# Patient Record
Sex: Male | Born: 1940
Health system: Southern US, Community
[De-identification: ages and names within clinical notes are randomized; demographics above are authoritative.]

## PROBLEM LIST (undated history)

## (undated) DIAGNOSIS — E538 Deficiency of other specified B group vitamins: Secondary | ICD-10-CM

## (undated) DIAGNOSIS — I639 Cerebral infarction, unspecified: Secondary | ICD-10-CM

## (undated) DIAGNOSIS — U071 COVID-19: Secondary | ICD-10-CM

## (undated) DIAGNOSIS — I10 Essential (primary) hypertension: Secondary | ICD-10-CM

## (undated) DIAGNOSIS — H269 Unspecified cataract: Secondary | ICD-10-CM

## (undated) DIAGNOSIS — E559 Vitamin D deficiency, unspecified: Secondary | ICD-10-CM

## (undated) DIAGNOSIS — K317 Polyp of stomach and duodenum: Secondary | ICD-10-CM

## (undated) DIAGNOSIS — B029 Zoster without complications: Secondary | ICD-10-CM

## (undated) DIAGNOSIS — K861 Other chronic pancreatitis: Secondary | ICD-10-CM

## (undated) DIAGNOSIS — K219 Gastro-esophageal reflux disease without esophagitis: Secondary | ICD-10-CM

## (undated) DIAGNOSIS — K449 Diaphragmatic hernia without obstruction or gangrene: Secondary | ICD-10-CM

## (undated) DIAGNOSIS — M1990 Unspecified osteoarthritis, unspecified site: Secondary | ICD-10-CM

## (undated) DIAGNOSIS — J449 Chronic obstructive pulmonary disease, unspecified: Secondary | ICD-10-CM

## (undated) DIAGNOSIS — E7800 Pure hypercholesterolemia, unspecified: Secondary | ICD-10-CM

## (undated) DIAGNOSIS — G5600 Carpal tunnel syndrome, unspecified upper limb: Secondary | ICD-10-CM

## (undated) DIAGNOSIS — L570 Actinic keratosis: Secondary | ICD-10-CM

## (undated) DIAGNOSIS — J189 Pneumonia, unspecified organism: Secondary | ICD-10-CM

## (undated) HISTORY — DX: Deficiency of other specified B group vitamins: E53.8

## (undated) HISTORY — PX: BACK SURGERY: SHX140

## (undated) HISTORY — DX: Other chronic pancreatitis: K86.1

## (undated) HISTORY — DX: COVID-19: U07.1

## (undated) HISTORY — DX: Polyp of stomach and duodenum: K31.7

## (undated) HISTORY — DX: Zoster without complications: B02.9

## (undated) HISTORY — DX: Gastro-esophageal reflux disease without esophagitis: K21.9

## (undated) HISTORY — DX: Diaphragmatic hernia without obstruction or gangrene: K44.9

## (undated) HISTORY — DX: Vitamin D deficiency, unspecified: E55.9

## (undated) HISTORY — DX: Unspecified cataract: H26.9

## (undated) HISTORY — DX: Pure hypercholesterolemia, unspecified: E78.00

## (undated) HISTORY — DX: Essential (primary) hypertension: I10

## (undated) HISTORY — PX: CHOLECYSTECTOMY: SHX55

## (undated) HISTORY — PX: MELANOMA EXCISION: SHX5266

## (undated) HISTORY — PX: CATARACT EXTRACTION W/ INTRAOCULAR LENS  IMPLANT, BILATERAL: SHX1307

## (undated) HISTORY — DX: Actinic keratosis: L57.0

## (undated) HISTORY — PX: APPENDECTOMY: SHX54

## (undated) HISTORY — DX: Unspecified osteoarthritis, unspecified site: M19.90

---

## 1998-02-09 ENCOUNTER — Ambulatory Visit (HOSPITAL_COMMUNITY): Admission: RE | Admit: 1998-02-09 | Discharge: 1998-02-09 | Payer: Self-pay | Admitting: Cardiology

## 2003-10-21 DIAGNOSIS — C4491 Basal cell carcinoma of skin, unspecified: Secondary | ICD-10-CM

## 2003-10-21 DIAGNOSIS — C439 Malignant melanoma of skin, unspecified: Secondary | ICD-10-CM

## 2003-10-21 HISTORY — DX: Basal cell carcinoma of skin, unspecified: C44.91

## 2003-10-21 HISTORY — DX: Malignant melanoma of skin, unspecified: C43.9

## 2003-12-29 ENCOUNTER — Encounter: Payer: Self-pay | Admitting: Gastroenterology

## 2004-01-04 ENCOUNTER — Ambulatory Visit (HOSPITAL_COMMUNITY): Admission: RE | Admit: 2004-01-04 | Discharge: 2004-01-04 | Payer: Self-pay | Admitting: Gastroenterology

## 2004-05-19 ENCOUNTER — Ambulatory Visit: Payer: Self-pay

## 2004-06-09 ENCOUNTER — Ambulatory Visit: Payer: Self-pay | Admitting: Gastroenterology

## 2004-06-12 ENCOUNTER — Ambulatory Visit: Payer: Self-pay | Admitting: Gastroenterology

## 2004-06-13 ENCOUNTER — Encounter (INDEPENDENT_AMBULATORY_CARE_PROVIDER_SITE_OTHER): Payer: Self-pay | Admitting: Specialist

## 2004-06-13 ENCOUNTER — Observation Stay (HOSPITAL_COMMUNITY): Admission: EM | Admit: 2004-06-13 | Discharge: 2004-06-13 | Payer: Self-pay | Admitting: *Deleted

## 2005-02-20 ENCOUNTER — Ambulatory Visit: Payer: Self-pay | Admitting: Gastroenterology

## 2005-03-21 ENCOUNTER — Ambulatory Visit: Payer: Self-pay | Admitting: Dermatology

## 2006-12-12 ENCOUNTER — Ambulatory Visit: Payer: Self-pay | Admitting: Gastroenterology

## 2007-05-18 ENCOUNTER — Emergency Department (HOSPITAL_COMMUNITY): Admission: EM | Admit: 2007-05-18 | Discharge: 2007-05-18 | Payer: Self-pay | Admitting: Emergency Medicine

## 2007-07-02 DIAGNOSIS — E7800 Pure hypercholesterolemia, unspecified: Secondary | ICD-10-CM | POA: Insufficient documentation

## 2007-07-02 DIAGNOSIS — K219 Gastro-esophageal reflux disease without esophagitis: Secondary | ICD-10-CM | POA: Insufficient documentation

## 2007-07-02 DIAGNOSIS — E782 Mixed hyperlipidemia: Secondary | ICD-10-CM | POA: Insufficient documentation

## 2008-02-10 DIAGNOSIS — C4491 Basal cell carcinoma of skin, unspecified: Secondary | ICD-10-CM

## 2008-02-10 HISTORY — DX: Basal cell carcinoma of skin, unspecified: C44.91

## 2008-10-18 ENCOUNTER — Encounter: Payer: Self-pay | Admitting: Gastroenterology

## 2008-11-09 ENCOUNTER — Encounter: Payer: Self-pay | Admitting: Gastroenterology

## 2008-12-21 ENCOUNTER — Telehealth: Payer: Self-pay | Admitting: Gastroenterology

## 2009-02-21 ENCOUNTER — Telehealth (INDEPENDENT_AMBULATORY_CARE_PROVIDER_SITE_OTHER): Payer: Self-pay | Admitting: *Deleted

## 2009-03-22 ENCOUNTER — Ambulatory Visit: Payer: Self-pay | Admitting: Gastroenterology

## 2009-03-22 DIAGNOSIS — I10 Essential (primary) hypertension: Secondary | ICD-10-CM | POA: Insufficient documentation

## 2009-04-09 HISTORY — PX: COLOSTOMY: SHX63

## 2009-04-09 HISTORY — PX: COLONOSCOPY: SHX174

## 2009-05-11 ENCOUNTER — Ambulatory Visit: Payer: Self-pay | Admitting: Gastroenterology

## 2009-05-11 LAB — HM COLONOSCOPY: HM COLON: NORMAL

## 2009-05-13 ENCOUNTER — Ambulatory Visit: Payer: Self-pay | Admitting: Gastroenterology

## 2009-05-13 DIAGNOSIS — E538 Deficiency of other specified B group vitamins: Secondary | ICD-10-CM | POA: Insufficient documentation

## 2009-05-13 LAB — CONVERTED CEMR LAB
ALT: 15 units/L (ref 0–53)
AST: 24 units/L (ref 0–37)
Albumin: 4.3 g/dL (ref 3.5–5.2)
Alkaline Phosphatase: 73 units/L (ref 39–117)
BUN: 16 mg/dL (ref 6–23)
Basophils Absolute: 0.1 10*3/uL (ref 0.0–0.1)
Basophils Relative: 0.7 % (ref 0.0–3.0)
Bilirubin, Direct: 0.1 mg/dL (ref 0.0–0.3)
CO2: 29 meq/L (ref 19–32)
Calcium: 9.5 mg/dL (ref 8.4–10.5)
Chloride: 106 meq/L (ref 96–112)
Creatinine, Ser: 1.2 mg/dL (ref 0.4–1.5)
Eosinophils Absolute: 0.2 10*3/uL (ref 0.0–0.7)
Eosinophils Relative: 2.6 % (ref 0.0–5.0)
Ferritin: 157.9 ng/mL (ref 22.0–322.0)
Folate: 7.7 ng/mL
GFR calc non Af Amer: 64.03 mL/min (ref >60)
Glucose, Bld: 83 mg/dL (ref 70–99)
HCT: 44.2 % (ref 39.0–52.0)
Hemoglobin: 15.3 g/dL (ref 13.0–17.0)
Iron: 145 ug/dL (ref 42–165)
Lymphocytes Relative: 27.4 % (ref 12.0–46.0)
Lymphs Abs: 2 10*3/uL (ref 0.7–4.0)
MCHC: 34.5 g/dL (ref 30.0–36.0)
MCV: 93.9 fL (ref 78.0–100.0)
Monocytes Absolute: 1.1 10*3/uL — ABNORMAL HIGH (ref 0.1–1.0)
Monocytes Relative: 15.8 % — ABNORMAL HIGH (ref 3.0–12.0)
Neutro Abs: 3.8 10*3/uL (ref 1.4–7.7)
Neutrophils Relative %: 53.5 % (ref 43.0–77.0)
Platelets: 179 10*3/uL (ref 150.0–400.0)
Potassium: 4.5 meq/L (ref 3.5–5.1)
RBC: 4.71 M/uL (ref 4.22–5.81)
RDW: 13.2 % (ref 11.5–14.6)
Saturation Ratios: 25.3 % (ref 20.0–50.0)
Sodium: 142 meq/L (ref 135–145)
TSH: 1.05 microintl units/mL (ref 0.35–5.50)
Total Bilirubin: 0.8 mg/dL (ref 0.3–1.2)
Total Protein: 7.4 g/dL (ref 6.0–8.3)
Transferrin: 409.6 mg/dL — ABNORMAL HIGH (ref 212.0–360.0)
Vitamin B-12: 236 pg/mL (ref 211–911)
WBC: 7.2 10*3/uL (ref 4.5–10.5)

## 2009-05-20 ENCOUNTER — Ambulatory Visit: Payer: Self-pay | Admitting: Gastroenterology

## 2009-05-27 ENCOUNTER — Ambulatory Visit: Payer: Self-pay | Admitting: Gastroenterology

## 2009-05-27 ENCOUNTER — Telehealth (INDEPENDENT_AMBULATORY_CARE_PROVIDER_SITE_OTHER): Payer: Self-pay | Admitting: *Deleted

## 2009-07-25 ENCOUNTER — Telehealth: Payer: Self-pay | Admitting: Gastroenterology

## 2009-11-28 ENCOUNTER — Telehealth: Payer: Self-pay | Admitting: Gastroenterology

## 2009-12-06 ENCOUNTER — Ambulatory Visit: Payer: Self-pay | Admitting: Gastroenterology

## 2009-12-06 DIAGNOSIS — R197 Diarrhea, unspecified: Secondary | ICD-10-CM | POA: Insufficient documentation

## 2009-12-06 LAB — CONVERTED CEMR LAB
ALT: 16 units/L (ref 0–53)
AST: 23 units/L (ref 0–37)
Albumin: 3.9 g/dL (ref 3.5–5.2)
Alkaline Phosphatase: 70 units/L (ref 39–117)
BUN: 15 mg/dL (ref 6–23)
Basophils Absolute: 0 10*3/uL (ref 0.0–0.1)
Basophils Relative: 0.5 % (ref 0.0–3.0)
Bilirubin, Direct: 0.2 mg/dL (ref 0.0–0.3)
CO2: 25 meq/L (ref 19–32)
Calcium: 9.1 mg/dL (ref 8.4–10.5)
Chloride: 108 meq/L (ref 96–112)
Creatinine, Ser: 1 mg/dL (ref 0.4–1.5)
Eosinophils Absolute: 0.1 10*3/uL (ref 0.0–0.7)
Eosinophils Relative: 1.4 % (ref 0.0–5.0)
Ferritin: 178.9 ng/mL (ref 22.0–322.0)
Folate: 5.9 ng/mL
GFR calc non Af Amer: 76.21 mL/min (ref >60)
Glucose, Bld: 86 mg/dL (ref 70–99)
HCT: 40.4 % (ref 39.0–52.0)
Hemoglobin: 13.9 g/dL (ref 13.0–17.0)
IgA: 94 mg/dL (ref 68–378)
Iron: 168 ug/dL — ABNORMAL HIGH (ref 42–165)
Lymphocytes Relative: 26.1 % (ref 12.0–46.0)
Lymphs Abs: 2 10*3/uL (ref 0.7–4.0)
MCHC: 34.4 g/dL (ref 30.0–36.0)
MCV: 92.5 fL (ref 78.0–100.0)
Magnesium: 2 mg/dL (ref 1.5–2.5)
Monocytes Absolute: 0.9 10*3/uL (ref 0.1–1.0)
Monocytes Relative: 11.6 % (ref 3.0–12.0)
Neutro Abs: 4.5 10*3/uL (ref 1.4–7.7)
Neutrophils Relative %: 60.4 % (ref 43.0–77.0)
Platelets: 180 10*3/uL (ref 150.0–400.0)
Potassium: 4.1 meq/L (ref 3.5–5.1)
RBC: 4.36 M/uL (ref 4.22–5.81)
RDW: 15.1 % — ABNORMAL HIGH (ref 11.5–14.6)
Saturation Ratios: 31.6 % (ref 20.0–50.0)
Sodium: 141 meq/L (ref 135–145)
TSH: 0.86 microintl units/mL (ref 0.35–5.50)
Total Bilirubin: 0.5 mg/dL (ref 0.3–1.2)
Total Protein: 6.3 g/dL (ref 6.0–8.3)
Transferrin: 379.2 mg/dL — ABNORMAL HIGH (ref 212.0–360.0)
Vitamin B-12: 380 pg/mL (ref 211–911)
WBC: 7.5 10*3/uL (ref 4.5–10.5)

## 2009-12-07 ENCOUNTER — Encounter: Payer: Self-pay | Admitting: Gastroenterology

## 2010-01-03 ENCOUNTER — Ambulatory Visit: Payer: Self-pay | Admitting: Gastroenterology

## 2010-01-03 DIAGNOSIS — K861 Other chronic pancreatitis: Secondary | ICD-10-CM | POA: Insufficient documentation

## 2010-01-30 ENCOUNTER — Telehealth: Payer: Self-pay | Admitting: Gastroenterology

## 2010-03-21 ENCOUNTER — Encounter: Payer: Self-pay | Admitting: Gastroenterology

## 2010-04-25 ENCOUNTER — Ambulatory Visit
Admission: RE | Admit: 2010-04-25 | Discharge: 2010-04-25 | Payer: Self-pay | Source: Home / Self Care | Attending: Gastroenterology | Admitting: Gastroenterology

## 2010-05-01 ENCOUNTER — Ambulatory Visit (HOSPITAL_COMMUNITY)
Admission: RE | Admit: 2010-05-01 | Discharge: 2010-05-01 | Payer: Self-pay | Source: Home / Self Care | Attending: Gastroenterology | Admitting: Gastroenterology

## 2010-05-08 ENCOUNTER — Telehealth: Payer: Self-pay | Admitting: Gastroenterology

## 2010-05-09 NOTE — Assessment & Plan Note (Signed)
Summary: #2 OF 3 WEEKLY B12.Marland KitchenMarland KitchenAM.  Nurse Visit   Allergies: 1)  Septra (Sulfamethoxazole-Trimethoprim)  Medication Administration  Injection # 1:    Medication: Vit B12 1000 mcg    Diagnosis: B12 DEFICIENCY (ICD-266.2)    Route: IM    Site: L deltoid    Exp Date: 02/08/2011    Lot #: [id number]    Mfr: American Regent    Patient tolerated injection without complications    Given by: Christie Nottingham CMA Duncan Dull) (May 20, 2009 8:51 AM)  Orders Added: 1)  Vit B12 1000 mcg [J3420]

## 2010-05-09 NOTE — Progress Notes (Signed)
Summary: med ?  Phone Note Call from Patient Call back at Home Phone [phone]   Caller: Patient Call For: Dr. Jarold Motto Reason for Call: Talk to Nurse Summary of Call: would like to lower [name] dosages Initial call taken by: Vallarie Mare,  January 30, 2010 8:25 AM  Follow-up for Phone Call        patient is in the dought hole and wants to decrease to once by mouth at meals and he wanted samples. I gave 8 bottle and advised ok to decease and see how it works and if increased pain he will go back to two at meals.  Follow-up by: Harlow Mares CMA (AAMA),  January 30, 2010 9:30 AM    New/Updated Medications: [name] 15000 UNIT CPEP (PANCRELIPASE (LIP-PROT-AMYL)) take 1 tabs by mouth three times a day with meals and one with snack.

## 2010-05-09 NOTE — Assessment & Plan Note (Signed)
Summary: #3 of 3 weekly b-12 inj/all  Nurse Visit   Allergies: 1)  Septra (Sulfamethoxazole-Trimethoprim)  Medication Administration  Injection # 1:    Medication: Vit B12 1000 mcg    Diagnosis: B12 DEFICIENCY (ICD-266.2)    Route: IM    Site: L thigh    Exp Date: 02/2011    Lot #: [id number]    Mfr: American Regent    Comments: Trained Pt to administer B12 injections to himself. Wants rx and syringes called into [facility]. Informed Lupita Leash to order pt supplies.    Patient tolerated injection without complications    Given by: Merri Ray CMA Duncan Dull) (May 27, 2009 8:36 AM)  Orders Added: 1)  Vit B12 1000 mcg [J3420]

## 2010-05-09 NOTE — Assessment & Plan Note (Signed)
Summary: 4 WK F/U   History of Present Illness Visit Type: Follow-up Visit Primary GI MD: Sheryn Bison MD FACP FAGA Primary Claudine Stallings: Raliegh Ip  Requesting Soleia Badolato: n/a Chief Complaint: diarrhea is sporadic, he willhave 3-4 BM's/day with an episode. Pt denies any rectal bleeding. History of Present Illness:   his diarrhea has improved on pancreatic extract therapy. He had a marked depressed fecal Elastase-1 level consistent with exocrine pancreatic insufficiency. He also uses p.r.n. Imodium and has been able to stop his Colestid and Questran. He has no other symptoms of malabsorption, or systemic illness. His appetite is good his weight is stable. He is no longer on PPI therapy. He denies a history of alcohol abuse or family history of pancreatic disease. He is up-to-date on his endoscopy and colonoscopy. Recent B12, folate, and other labs were normal.   GI Review of Systems      Denies abdominal pain, acid reflux, belching, bloating, chest pain, dysphagia with liquids, dysphagia with solids, heartburn, loss of appetite, nausea, vomiting, vomiting blood, weight loss, and  weight gain.      Reports diarrhea.     Denies anal fissure, black tarry stools, change in bowel habit, constipation, diverticulosis, fecal incontinence, heme positive stool, hemorrhoids, irritable bowel syndrome, jaundice, light color stool, liver problems, rectal bleeding, and  rectal pain.    Current Medications (verified): 1)  Colestid 1 Gm Tabs (Colestipol Hcl) .Marland Kitchen.. 1 By Mouth Two Times A Day 2)  Lisinopril 20 Mg Tabs (Lisinopril) .... One Tablet By Mouth Once Daily 3)  Lovastatin 40 Mg Tabs (Lovastatin) .... One Tablet By Mouth Once Daily 4)  Imodium A-D 2 Mg Tabs (Loperamide Hcl) .... Take One By Mouth Once Daily As Needed 5)  Syringe Disposable 3 Ml Misc (Syringe (Disposable)) .... Inject 1 Cc B12 Once Monthly 6)  Gemfibrozil 600 Mg Tabs (Gemfibrozil) .... Take One Tablet By Mouth Two Times A Day 7)   [name] 15000 Unit Cpep (Pancrelipase (Lip-Prot-Amyl)) .... Take 2 Tabs By Mouth Three Times A Day With Meals  Allergies (verified): 1)  Septra  Past History:  Past medical, surgical, family and social histories (including risk factors) reviewed for relevance to current acute and chronic problems.  Past Medical History: Reviewed history from 12/06/2009 and no changes required. Hx. of melanoma removed cardiolite stress test(02/03/1998) Hypertensive Cardiovascular Disease left heart catheterization(02/09/1998)  Past Surgical History: Reviewed history from 07/02/2007 and no changes required. Appendectomy Cholecystectomy  Family History: Reviewed history from 03/22/2009 and no changes required. No FH of Colon Cancer: Family History of Crohn's:Sister  Family History of Colitis: Brother   Social History: Reviewed history from 12/06/2009 and no changes required. Occupation: Truck Driver-Local Patient currently smokes. 1 ppd Alcohol Use - no Daily Caffeine Use: coffee daily  Illicit Drug Use - no  Review of Systems  The patient denies allergy/sinus, anemia, anxiety-new, arthritis/joint pain, back pain, blood in urine, breast changes/lumps, confusion, cough, coughing up blood, depression-new, fainting, fatigue, fever, headaches-new, hearing problems, heart murmur, heart rhythm changes, itching, muscle pains/cramps, night sweats, nosebleeds, shortness of breath, skin rash, sleeping problems, sore throat, swelling of feet/legs, swollen lymph glands, thirst - excessive, urination - excessive, urination changes/pain, urine leakage, vision changes, and voice change.    Vital Signs:  Patient profile:   70 year old male Height:      67 inches Weight:      158 pounds BMI:     24.84 Pulse rate:   76 / minute Pulse rhythm:   regular  BP sitting:   120 / 66  (left arm) Cuff size:   regular  Vitals Entered By: Francee Piccolo CMA Duncan Dull) (January 03, 2010 10:57 AM)  Physical  Exam  General:  Well developed, well nourished, no acute distress.healthy appearing.   Head:  Normocephalic and atraumatic. Eyes:  PERRLA, no icterus.exam deferred to patient's ophthalmologist.   Psych:  Alert and cooperative. Normal mood and affect.   Impression & Recommendations:  Problem # 1:  CHRONIC PANCREATITIS (ICD-577.1) Assessment Improved Continued use of [name] pancreatic extract 2 pills with meals and one with snacks. P.r.n. Imodium also advised. GI followup in 2-3 months time.  Problem # 2:  DIARRHEA (ICD-787.91) Assessment: Improved  Problem # 3:  B12 DEFICIENCY (ICD-266.2) Assessment: Improved  Problem # 4:  ACID REFLUX DISEASE (ICD-530.81) Assessment: Improved  Patient Instructions: 1)  Copy sent to : Raliegh Ip  2)  Your prescription has been sent to you pharmacy.  3)  Please continue current medications.  4)  Please schedule a follow-up appointment in 3 months. 5)  The medication list was reviewed and reconciled.  All changed / newly prescribed medications were explained.  A complete medication list was provided to the patient / caregiver. Prescriptions: [name] 15000 UNIT CPEP (PANCRELIPASE (LIP-PROT-AMYL)) take 2 tabs by mouth three times a day with meals and one with snack.  #240 x 3   Entered by:   Harlow Mares CMA (AAMA)   Authorized by:   Mardella Layman MD [facility]   Signed by:   Harlow Mares CMA (AAMA) on 01/03/2010   Method used:   Electronically to        [facility]* (retail)       [address]       [place], [place]  [address]       Ph: [phone]       Fax: [phone]   RxID:   [id number]

## 2010-05-09 NOTE — Progress Notes (Signed)
Summary: triage  Phone Note Call from Patient Call back at Home Phone [phone] Call back at cell---- [phone]   Caller: Patient Call For: Dr Jarold Motto Reason for Call: Talk to Nurse Summary of Call: Patient wants to know if he can get something called in for severe diarrhea states that Cholestyramine is not working and Dr Jarold Motto had mentioned another med last time he saw him.  Initial call taken by: Tawni Levy,  November 28, 2009 9:33 AM  Follow-up for Phone Call        Pt has been taknig Questran powder for 5 years.  Has worked well until recently.  Pt has colon in Feb and Dr. Jarold Motto mtntioned to pt that there was a pill that he could take if the powder did not work.  Pt would like to try the Pill.  Pt states he tried taking the [place] two times a day and he still had diarrhea. Follow-up by: Ashok Cordia RN,  November 28, 2009 10:01 AM  Additional Follow-up for Phone Call Additional follow up Details #1::        needs ov Additional Follow-up by: Mardella Layman MD Clementeen Graham,  November 28, 2009 10:54 AM    Additional Follow-up for Phone Call Additional follow up Details #2::    Appt Scheduled.  Pt notified. Follow-up by: Ashok Cordia RN,  November 28, 2009 11:01 AM

## 2010-05-09 NOTE — Letter (Signed)
Summary: Patient Notice- Colon Biospy Results  [facility]  [address] [place], [place] [address]   Phone: [phone]  Fax: [phone]        May 13, 2009 MRN: [id number]    Cody Willis [address] [place], [place]  [address]    Dear Cody Willis,  I am pleased to inform you that the biopsies taken during your recent colonoscopy did not show any evidence of cancer upon pathologic examination.  Additional information/recommendations:  _x_No further action is needed at this time.  Please follow-up with      your primary care physician for your other healthcare needs.  __Please call [phone] to schedule a return visit to review      your condition.  __Continue with the treatment plan as outlined on the day of your      exam.  __You should have a repeat colonoscopy examination for this problem           in _ years.  Please call us if you are having persistent problems or have questions about your condition that have not been fully answered at this time.  Sincerely,  Mardella Layman MD [facility]   This letter has been electronically signed by your physician.  Appended Document: Patient Notice- Colon Biospy Results Letter mailed 2.8.11

## 2010-05-09 NOTE — Progress Notes (Signed)
Summary: Refill  Phone Note From Pharmacy   Caller: [facility]* Summary of Call: Refill requested for Questran. Initial call taken by: Ashok Cordia RN,  July 25, 2009 8:37 AM    Prescriptions: CHOLESTYRAMINE 4 GM/DOSE POWD (CHOLESTYRAMINE) Take 1 scoop dissolved in water once a day  #1 x 11   Entered by:   Ashok Cordia RN   Authorized by:   Mardella Layman MD [facility]   Signed by:   Ashok Cordia RN on 07/25/2009   Method used:   Electronically to        [facility]* (retail)       [address]       [place], [place]  [address]       Ph: [phone]       Fax: [phone]   RxID:   [id number]

## 2010-05-09 NOTE — Assessment & Plan Note (Signed)
Summary: #1 of 3 weekly B12 inj/dfs  Nurse Visit   Allergies: 1)  Septra (Sulfamethoxazole-Trimethoprim)  Medication Administration  Injection # 1:    Medication: Vit B12 1000 mcg    Diagnosis: B12 DEFICIENCY (ICD-266.2)    Route: IM    Site: R deltoid    Exp Date: 02/2011    Lot #: [id number]    Mfr: American Regent    Comments: pt scheduled at front desk for # 2 weekly of 3 and then monthly    Patient tolerated injection without complications    Given by: Chales Abrahams CMA Duncan Dull) (May 13, 2009 11:31 AM)  Orders Added: 1)  Vit B12 1000 mcg [J3420]

## 2010-05-09 NOTE — Progress Notes (Signed)
Summary: B12 injections  Phone Note Outgoing Call   Call placed by: Ashok Cordia RN,  May 27, 2009 8:33 AM Summary of Call: Pt is going to give his own B12 injections.  Needs Rx sent to pharmacy for B12 and syringes.  Uses [facility]. Initial call taken by: Ashok Cordia RN,  May 27, 2009 8:34 AM    New/Updated Medications: CYANOCOBALAMIN 1000 MCG/ML INJ SOLN (CYANOCOBALAMIN) 1 cc Im  monthly SYRINGE DISPOSABLE 3 ML MISC (SYRINGE (DISPOSABLE)) Inject 1 cc B12 once monthly Prescriptions: SYRINGE DISPOSABLE 3 ML MISC (SYRINGE (DISPOSABLE)) Inject 1 cc B12 once monthly  #12 x 6   Entered by:   Ashok Cordia RN   Authorized by:   Mardella Layman MD [facility]   Signed by:   Ashok Cordia RN on 05/27/2009   Method used:   Electronically to        [facility]* (retail)       [address]       [place], [place]  [address]       Ph: [phone]       Fax: [phone]   RxID:   [id number] CYANOCOBALAMIN 1000 MCG/ML INJ SOLN (CYANOCOBALAMIN) 1 cc Im  monthly  #12 x 6   Entered by:   Ashok Cordia RN   Authorized by:   Mardella Layman MD [facility]   Signed by:   Ashok Cordia RN on 05/27/2009   Method used:   Electronically to        [facility]* (retail)       [address]       [place], [place]  [address]       Ph: [phone]       Fax: [phone]   RxID:   [id number]

## 2010-05-09 NOTE — Assessment & Plan Note (Signed)
Summary: Diarrhea/dfs   History of Present Illness Visit Type: Follow-up Visit Primary GI MD: Sheryn Bison MD FACP FAGA Primary Mariska Daffin: Raliegh Ip  Requesting Jadon Harbaugh: n/a Chief Complaint: Patient has some questions about changing from Cholestyramine to another drug, He is having increased diarrhea and has even has some fecal incontinence.  History of Present Illness:   70 year old Caucasian male on chronic Nexium therapy for acid reflux. He is status post cholecystectomy and has had chronic diarrhea since his surgery, initially responsive to Questran therapy. Colonoscopy years ago with multiple biopsies suggested collagenous colitis. However, this was recently repeated and biopsies were normal. He currently is taking 2 scoops of Questran a day with continued soft stooling throughout the day without hematochezia, melena, or systemic complaints. He does have mild gas, bloating, and cramping. He denies antibiotic use, anorexia, weight loss, or any specific food intolerances. Family history is noncontributory.   GI Review of Systems      Denies abdominal pain, acid reflux, belching, bloating, chest pain, dysphagia with liquids, dysphagia with solids, heartburn, loss of appetite, nausea, vomiting, vomiting blood, weight loss, and  weight gain.      Reports diarrhea and  fecal incontinence.     Denies anal fissure, black tarry stools, change in bowel habit, constipation, diverticulosis, heme positive stool, hemorrhoids, irritable bowel syndrome, jaundice, light color stool, liver problems, rectal bleeding, and  rectal pain.    Current Medications (verified): 1)  Cholestyramine 4 Gm/dose Powd (Cholestyramine) .... Take 1 Scoop Dissolved in Water Once A Day 2)  Nexium 40 Mg Cpdr (Esomeprazole Magnesium) .... Take 1 Capsule By Mouth Once A Day 3)  Lisinopril 20 Mg Tabs (Lisinopril) .... One Tablet By Mouth Once Daily 4)  Lovastatin 40 Mg Tabs (Lovastatin) .... One Tablet By Mouth Once  Daily 5)  Imodium A-D 2 Mg Tabs (Loperamide Hcl) .... Take One By Mouth Once Daily As Needed 6)  Syringe Disposable 3 Ml Misc (Syringe (Disposable)) .... Inject 1 Cc B12 Once Monthly 7)  Gemfibrozil 600 Mg Tabs (Gemfibrozil) .... Take One Tablet By Mouth Two Times A Day  Allergies (verified): 1)  Septra (Sulfamethoxazole-Trimethoprim)  Past History:  Past medical, surgical, family and social histories (including risk factors) reviewed for relevance to current acute and chronic problems.  Past Medical History: Hx. of melanoma removed cardiolite stress test(02/03/1998) Hypertensive Cardiovascular Disease left heart catheterization(02/09/1998)  Past Surgical History: Reviewed history from 07/02/2007 and no changes required. Appendectomy Cholecystectomy  Family History: Reviewed history from 03/22/2009 and no changes required. No FH of Colon Cancer: Family History of Crohn's:Sister  Family History of Colitis: Brother   Social History: Reviewed history from 03/22/2009 and no changes required. Occupation: Retired Patient currently smokes. 1 ppd Alcohol Use - no Daily Caffeine Use: coffee daily  Illicit Drug Use - no  Review of Systems  The patient denies allergy/sinus, anemia, anxiety-new, arthritis/joint pain, back pain, blood in urine, breast changes/lumps, change in vision, confusion, cough, coughing up blood, depression-new, fainting, fatigue, fever, headaches-new, hearing problems, heart murmur, heart rhythm changes, itching, muscle pains/cramps, night sweats, nosebleeds, shortness of breath, skin rash, sleeping problems, sore throat, swelling of feet/legs, swollen lymph glands, thirst - excessive, urination - excessive, urination changes/pain, urine leakage, vision changes, and voice change.    Vital Signs:  Patient profile:   70 year old male Height:      67 inches Weight:      156.6 pounds BMI:     24.62 Pulse rate:   76 / minute  Pulse rhythm:   regular BP sitting:    112 / 62  (left arm) Cuff size:   regular  Vitals Entered By: Harlow Mares CMA Duncan Dull) (December 06, 2009 8:28 AM)  Physical Exam  General:  Well developed, well nourished, no acute distress.healthy appearing.   Head:  Normocephalic and atraumatic. Eyes:  exam deferred to patient's ophthalmologist.   Lungs:  Clear throughout to auscultation. Heart:  Regular rate and rhythm; no murmurs, rubs,  or bruits. Rectal:  Normal exam.hemocult negative.   Prostate:  .normal size prostate.   Msk:  Symmetrical with no gross deformities. Normal posture. Extremities:  No clubbing, cyanosis, edema or deformities noted. Neurologic:  Alert and  oriented x4;  grossly normal neurologically.   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 There has been no evidence of inflammatory bowel disease, collagenous colitis, malabsorption, et Karie Soda. I have changed him from Questran to Colestid 2 g twice a day continued use of p.r.n. Imodium seems to work well. He wanted some of his" brother's pills" which are Lomotil tablets. We will check a few simple lab test and stool exams to exclude other unusual causes of diarrhea. I also had stopped his Nexium therapy with office followup in one month's time. The patient has no history of alcohol, cigarette, or NSAID abuse, hepatitis or pancreatitis. Orders: TLB-CBC Platelet - w/Differential (85025-CBCD) TLB-BMP (Basic Metabolic Panel-BMET) (80048-METABOL) TLB-Hepatic/Liver Function Pnl (80076-HEPATIC) TLB-TSH (Thyroid Stimulating Hormone) (84443-TSH) TLB-B12, Serum-Total ONLY (87564-P32) TLB-Ferritin (82728-FER) TLB-Folic Acid (Folate) (82746-FOL) TLB-IBC Pnl (Iron/FE;Transferrin) (83550-IBC) TLB-Magnesium (Mg) (83735-MG) TLB-IgA (Immunoglobulin A) (82784-IGA) T-Beta Carotene (95188-41660) T-Sprue Panel (Celiac  Disease Aby Eval) (83516x3/[address]) T-Stool for Fat, Quantitative [phone]) T-Stool Fats [place] Stain [phone]) T-Stool Giardia / Crypto- EIA (54270) T-Stool for O&P (62376-28315) T- * Misc. Laboratory test [phone])  Problem # 2:  B12 DEFICIENCY (ICD-266.2) Assessment: Improved Possibly associated with chronic bacterial overgrowth syndrome. Consideration will be given to referral for glucose hydrogen breath testing. I do not think he can afford Xifaxan medication. He is to continue his B12 replacement therapy as previously outlined.  Problem # 3:  ACID REFLUX DISEASE (ICD-530.81) Assessment: Improved Hold Nexium therapy to see if this is contributing to his diarrhea state.  Patient Instructions: 1)  Please go to the basement for lab work. 2)  Stop Nexium. 3)  Stop Questran powder. 4)  Begin Colestid tabs two times a day.  Pick up rx from your pharmacy. 5)  Please schedule a follow-up appointment in 3 weeks.  6)  The medication list was reviewed and reconciled.  All changed / newly prescribed medications were explained.  A complete medication list was provided to the patient / caregiver. 7)  Copy sent to : Dr. Loma Sender and Dr. Nanetta Batty 8)  Please schedule a follow-up appointment in 1 month.  Prescriptions: COLESTID 1 GM TABS (COLESTIPOL HCL) 1 by mouth two times a day  #60 x 6   Entered by:   Ashok Cordia RN   Authorized by:   Mardella Layman MD [facility]   Signed by:   Ashok Cordia RN on 12/06/2009   Method used:   Electronically to        [facility]* (retail)       [address]       [place], [place]  [address]       Ph: [phone]  Fax: [phone]   RxID:   [id number]

## 2010-05-09 NOTE — Procedures (Signed)
Summary: Colonoscopy  Patient: Juventino Pavone Note: All result statuses are Final unless otherwise noted.  Tests: (1) Colonoscopy (COL)   COL Colonoscopy           DONE     [facility]     [address]     [place], [place]  [address]           COLONOSCOPY PROCEDURE REPORT           PATIENT:  Torell, Minder  MR#:  [id number]     BIRTHDATE:  1940/05/18, 68 yrs. old  GENDER:  male           ENDOSCOPIST:  Vania Rea. Jarold Motto, MD, [facility]     Referred by:  Vania Rea. Jarold Motto, M.D.           PROCEDURE DATE:  05/11/2009     PROCEDURE:  Colonoscopy with biopsy     ASA CLASS:  Class II     INDICATIONS:  Colorectal Cancer Screening, Routine Risk Screening     hx. of bile salt diarrhea and ??? collagenous colitis.on po     questran with excellent response.           MEDICATIONS:   Fentanyl 75 mcg IV, Versed 6 mg IV           DESCRIPTION OF PROCEDURE:   After the risks benefits and     alternatives of the procedure were thoroughly explained, informed     consent was obtained.  Digital rectal exam was performed and     revealed no abnormalities.   The LB [id number] endoscope     was introduced through the anus and advanced to the cecum, which     was identified by both the appendix and ileocecal valve, without     limitations.  The quality of the prep was excellent, using     MoviPrep.  The instrument was then slowly withdrawn as the colon     was fully examined.     <<PROCEDUREIMAGES>>           FINDINGS:  No polyps or cancers were seen.  This was otherwise a     normal examination of the colon. random biopsies.   Retroflexed     views in the rectum revealed no abnormalities.    The scope was     then withdrawn from the patient and the procedure completed.     COMPLICATIONS:  None           ENDOSCOPIC IMPRESSION:     1) No polyps or cancers     2) Otherwise normal examination     RECOMMENDATIONS:     1) Return to the care of your primary provider. GI follow up as    needed     2) Continue current colorectal screening recommendations for     "routine risk" patients with a repeat colonoscopy in 10 years.     3) Await biopsy results           REPEAT EXAM:  No           ______________________________     Vania Rea. Jarold Motto, MD, Clementeen Graham           CC:  Loma Sender, MD           n.     Rosalie Doctor:   Vania Rea. Patterson at 05/11/2009 09:14 AM           [place], [place], [id number]  Note: An  exclamation mark (!) indicates a result that was not dispersed into the flowsheet. Document Creation Date: 05/11/2009 9:15 AM _______________________________________________________________________  (1) Order result status: Final Collection or observation date-time: 05/11/2009 09:08 Requested date-time:  Receipt date-time:  Reported date-time:  Referring Physician:   Ordering Physician: Sheryn Bison [phone]) Specimen Source:  Source: Launa Grill Order Number: [id number] Lab site:   Appended Document: Colonoscopy     Procedures Next Due Date:    Colonoscopy: 05/2019

## 2010-05-11 NOTE — Letter (Signed)
Summary: Office Visit Letter  [facility]  [address] [place], [place] [address]   Phone: [phone]  Fax: [phone]      March 21, 2010 MRN: [id number]   Cody Willis [address] [place], [place]  [address]   Dear Mr. Fauble,   According to our records, it is time for you to schedule a follow-up office visit with [place].   At your convenience, please call [phone] (option #2)to schedule an office visit. If you have any questions, concerns, or feel that this letter is in error, we would appreciate your call.   Sincerely,    Vania Rea. Jarold Motto, M.D.  [facility] [place] [facility] [phone]

## 2010-05-11 NOTE — Assessment & Plan Note (Signed)
Summary: FOLLOW UP/YF   History of Present Illness Visit Type: Follow-up Visit Primary GI MD: Sheryn Bison MD FACP FAGA Primary Provider: Loma Sender, MD Requesting Provider: n/a Chief Complaint: Patient here for f/u diarrhea. He states that since he begin [name], he has done very well. He denies any diarrhea now. History of Present Illness:   Cody Willis is currently asymptomatic on pancreatic extract. His workup showed evidence of chronic pancreatitis with extremely low levels of stool elastase-1, and he was placed on [name] 2 tablets t.i.d. He denies current GI problems. Additional meds include Colestid 1 g twice a day and p.r.n. Imodium. He has associated B12 deficiency and is on monthly IM replacement. He denies a past history of hepatitis, pancreatitis, or alcoholism.   GI Review of Systems      Denies abdominal pain, acid reflux, belching, bloating, chest pain, dysphagia with liquids, dysphagia with solids, heartburn, loss of appetite, nausea, vomiting, vomiting blood, weight loss, and  weight gain.        Denies anal fissure, black tarry stools, change in bowel habit, constipation, diarrhea, diverticulosis, fecal incontinence, heme positive stool, hemorrhoids, irritable bowel syndrome, jaundice, light color stool, liver problems, rectal bleeding, and  rectal pain.    Current Medications (verified): 1)  Colestid 1 Gm Tabs (Colestipol Hcl) .Marland Kitchen.. 1 By Mouth Two Times A Day 2)  Lisinopril 20 Mg Tabs (Lisinopril) .... One Tablet By Mouth Once Daily 3)  Lovastatin 40 Mg Tabs (Lovastatin) .... One Tablet By Mouth Once Daily 4)  Imodium A-D 2 Mg Tabs (Loperamide Hcl) .... Take One By Mouth Once Daily As Needed 5)  Syringe Disposable 3 Ml Misc (Syringe (Disposable)) .... Inject 1 Cc B12 Once Monthly 6)  Gemfibrozil 600 Mg Tabs (Gemfibrozil) .... Take One Tablet By Mouth Two Times A Day 7)  [name] 15000 Unit Cpep (Pancrelipase (Lip-Prot-Amyl)) .... Take 1 Tabs By Mouth Three Times A Day  With Meals and One With Snack. 8)  Cyanocobalamin 1000 Mcg/ml Soln (Cyanocobalamin) .... Inject Im Once Per Month  Allergies (verified): 1)  Septra  Past History:  Past medical, surgical, family and social histories (including risk factors) reviewed for relevance to current acute and chronic problems.  Past Medical History: Reviewed history from 12/06/2009 and no changes required. Hx. of melanoma removed cardiolite stress test(02/03/1998) Hypertensive Cardiovascular Disease left heart catheterization(02/09/1998)  Past Surgical History: Reviewed history from 07/02/2007 and no changes required. Appendectomy Cholecystectomy  Family History: Reviewed history from 03/22/2009 and no changes required. No FH of Colon Cancer: Family History of Crohn's:Sister  Family History of Colitis: Brother   Social History: Reviewed history from 01/03/2010 and no changes required. Occupation: Truck Driver-Local Patient currently smokes. 1 ppd Alcohol Use - no Daily Caffeine Use: coffee daily  Illicit Drug Use - no  Review of Systems       The patient complains of arthritis/joint pain.  The patient denies allergy/sinus, anemia, anxiety-new, back pain, blood in urine, breast changes/lumps, change in vision, confusion, cough, coughing up blood, depression-new, fainting, fatigue, fever, headaches-new, hearing problems, heart murmur, heart rhythm changes, itching, menstrual pain, muscle pains/cramps, night sweats, nosebleeds, pregnancy symptoms, shortness of breath, skin rash, sleeping problems, sore throat, swelling of feet/legs, swollen lymph glands, thirst - excessive , urination - excessive , urination changes/pain, urine leakage, vision changes, and voice change.    Vital Signs:  Patient profile:   70 year old male Height:      67 inches Weight:      157.50 pounds BMI:  24.76 BSA:     1.83 Pulse rate:   72 / minute Pulse rhythm:   regular BP sitting:   140 / 70  (left arm)  Vitals  Entered By: Lamona Curl CMA Duncan Dull) (April 25, 2010 9:08 AM)  Physical Exam  General:  Well developed, well nourished, no acute distress.healthy appearing.  healthy appearing.   Head:  Normocephalic and atraumatic. Eyes:  PERRLA, no icterus.exam deferred to patient's ophthalmologist.  exam deferred to patient's ophthalmologist.   Psych:  Alert and cooperative. Normal mood and affect.   Impression & Recommendations:  Problem # 1:  CHRONIC PANCREATITIS (ICD-577.1) Assessment Improved  continue [name] 2 tablets t.i.d. as tolerated. Upper abdominal ultrasound exam has been scheduled to image his pancreas.  Orders: Ultrasound Abdomen (UAS)  Problem # 2:  DIARRHEA (ICD-787.91) Assessment: Improved  Problem # 3:  B12 DEFICIENCY (ICD-266.2) Assessment: Improved Continue monthly IM parenteral replacement therapy.  Problem # 4:  ESSENTIAL HYPERTENSION, BENIGN (ICD-401.1) Assessment: Improved Blood Pressure 140/70 and He Is to Continue Lisinopril 20 Mg a Day As Tolerated.  Patient Instructions: 1)  Copy sent to : Loma Sender, MD 2)  Please continue current medications.  3)  Your abdominal ultrsound is scheduled for 05/01/2010, please follow seperate instructions.  4)  The medication list was reviewed and reconciled.  All changed / newly prescribed medications were explained.  A complete medication list was provided to the patient / caregiver.

## 2010-05-17 NOTE — Progress Notes (Signed)
Summary: result  Phone Note Call from Patient Call back at Home Phone [phone]   Caller: Patient Call For: Dr Jarold Motto Reason for Call: Talk to Nurse, Lab or Test Results Summary of Call: Patient wants Abd [place] results Initial call taken by: Tawni Levy,  May 08, 2010 1:43 PM  Follow-up for Phone Call        Lmom for patient to call back.Graciella Freer RN  May 08, 2010 2:32 PM   Spoke with patient to inform him per Dr Jarold Motto, the U/S findings were consistent with chronic pancreatitis. Patient stated he is doing well on the Zen Pep and will call for further problems. Follow-up by: Graciella Freer RN,  May 08, 2010 4:32 PM

## 2010-08-25 NOTE — Op Note (Signed)
Cody Willis, Cody Willis              ACCOUNT NO.:  [id number]   MEDICAL RECORD NO.:  [id number]          PATIENT TYPE:  INP   LOCATION:  1844                         FACILITY:  [facility]   PHYSICIAN:  Currie Paris, M.D.DATE OF BIRTH:  07-31-1940   DATE OF PROCEDURE:  06/13/2004  DATE OF DISCHARGE:                                 OPERATIVE REPORT   PREOPERATIVE DIAGNOSIS:  Acute calculus cholecystitis.   POSTOPERATIVE DIAGNOSIS:  Acute calculus cholecystitis.   OPERATION:  Laparoscopic cholecystectomy with operative cholangiogram.   SURGEON:  Cyndia Bent, M.D.   ASSISTANT:  Violeta Gelinas, M.D.   ANESTHESIA:  General endotracheal.   HISTORY:  This is a 70 year old gentleman who the day prior to admission was  seen by Dr. Sheryn Bison with a history of some abdominal pain suggestive  of biliary tract disease.  A gallbladder ultrasound had shown gallstones.  After leaving Dr. Norval Gable office, that evening he developed acute pain  and presented to the emergency room.  After evaluation in the emergency room  he was noted to have a slight elevation of white count of 11,000, right  upper quadrant tenderness and we elected to proceed to cholecystectomy.   DESCRIPTION OF PROCEDURE:  The patient was seen in the holding area and  again had no further questions.   He was taken to the operating room and after satisfactory general  endotracheal anesthesia obtained the abdomen was prepped and draped.  Time  out occurred to confirm correct patient procedure, etc.   I infiltrated 0.25% plain Marcaine at the umbilical area, made an incision  to open the fascia and under direct vision entered the peritoneal cavity.  A  pursestring was placed, the Hasson introduced and the abdomen insufflated to  15.  Thecamera was placed, we had good visualization and with the patient in  reverse Trendelenburg and tilted to the left a 10-11 trocar was placed under  direct vision in the epigastrium  and two 5s laterally.  Again we used local  for each incision.   The gallbladder was somewhat thick-walled, looked somewhat chronically  inflamed with a little bit of acute edema developing.   The gallbladder was retracted over the liver and the peritoneum over the  triangle of Calot opened, and the triangle of Calot dissected out.  I could  see the cystic duct which was fairly long, the cystic artery and I made a  window behind these so that we had good identification of the anatomy.  Initially I placed 3 clips on the cystic artery and one on the cystic duct  at its junction with the gallbladder.   The cystic duct was opened and a little debris milked out of it and there  was a little mucous suggestive of developing hydrops.  Once I was able to  get the debris out of the cystic duct I placed a Cook catheter  percutaneously and did an operative cholangiogram which looked normal.  There was good flow in the duodenum, no evidence of stones or filling  defects and good filling of the hepatic radicles.   The cystic duct catheter  was removed and 3 clips placed on the stay side of  the cystic duct and ends divided.  The cystic artery was divided.  The  gallbladder was removed from below to above with a couple of small posterior  branches coming out of the gallbladder bed, arteries clipped and divided.  Once the gallbladder was disconnected it was placed in a bag.  We irrigated  and made sure everything was dry.  The gallbladder was then brought out in  the umbilical port.   A final irrigation to check for hemostasis was done and again everything  appeared to be dry.  The lateral port was removed under direct vision.  A  pursestring was used to close the umbilical port.  The abdomen was deflated  through the epigastric port and it was removed.  The skin was closed with 4-  0 Monocryl and subcuticular Dermabond.   The patient tolerated the procedure well.  There were no operative   complications.  All counts were correct.      CJS/MEDQ  D:  06/13/2004  T:  06/13/2004  Job:  [id number]   cc:   Vania Rea. Jarold Motto, M.D. [facility]   Madaline Savage, M.D.  [phone] [address]  [place]  [place] [address]  Fax: [phone]

## 2010-08-25 NOTE — Assessment & Plan Note (Signed)
[facility]                         GASTROENTEROLOGY OFFICE NOTE   NAME:Cody Willis, Cody Willis                     MRN:          [id number]  DATE:12/12/2006                            DOB:          14-Sep-1940    Mr. Bley' diarrhea is completely managed by taking cholestyramine 4  grams a day.  He also has acid reflux which is managed well with Nexium  40 mg a day.  He is up to date on his endoscopic exams and colonoscopy  exams.  I have renewed his prescription.  We will see him on a p.r.n.  basis as needed.     Vania Rea. Jarold Motto, MD, Caleen Essex, FAGA  Electronically Signed    DRP/MedQ  DD: 12/12/2006  DT: 12/12/2006  Job #: [id number]

## 2010-08-25 NOTE — H&P (Signed)
NAMETRAESON, DUSZA              ACCOUNT NO.:  [id number]   MEDICAL RECORD NO.:  [id number]          PATIENT TYPE:  INP   LOCATION:  1844                         FACILITY:  [facility]   PHYSICIAN:  Currie Paris, M.D.DATE OF BIRTH:  [birth year]   DATE OF ADMISSION:  06/13/2004  DATE OF DISCHARGE:                                HISTORY & PHYSICAL   CHIEF COMPLAINT:  Abdominal pain.   HISTORY OF PRESENT ILLNESS:  Mr. Biederman is a 70 year old gentleman who  presented to the emergency room early this morning with acute right upper  quadrant abdominal pain.  He has been having intermittent problems with  epigastric and right upper quadrant pain over the last month.  It had gotten  somewhat worse.  It is often postprandial and he was trying to watch what he  was eating to avoid having pain.   He was seen yesterday in Vania Rea. Jarold Motto, M.D. [facility] office and  ultrasound showed some gallstones, but apparently without evidence of acute  cholecystitis, although, that is not clear initially.  However, he developed  more pain during the night and called Wilhemina Bonito. Marina Goodell, M.D. [facility] who was oncall  for Dr. Jarold Motto and was asked to come to the emergency room.  On  evaluation in the emergency room, he was continuing to have pain and right  upper quadrant tenderness and surgical consultation obtained.   At the time I saw him, he was somewhat better with pain medication, but  still complaining of discomfort in the right upper quadrant.  He was not  having significant nausea and vomiting.  He had no diarrhea.  He thinks he  has been having these types of symptoms for a couple of months.   PAST MEDICAL HISTORY:  He has had an appendectomy in the remote past.  He  has had melanoma taken off his right upper quadrant of the skin.   ALLERGIES:  SEPTRA currently causes rash.   MEDICATIONS:  He is on Lisinopril, hydrochlorothiazide, Lipitor, Nexium, and  some cholestyramine for diagnosis of diarrhea  or colitis.   REVIEW OF SYSTEMS:  HEENT:  Basically negative.  HEART:  No history of  coronary artery disease.  He does have hypertension.  He is seen by Madaline Savage, M.D. and had a stress test which was negative about a year ago.  He has no symptoms to suggest angina or congestive failure.  LUNGS:  No  problems with respirations.  No cough, shortness of breath, etc.  ABDOMEN:  Basically negative except for a history of colitis in the past, history of  some reflux, and his current symptoms.  GENITOURINARY:  Negative.  EXTREMITIES:  Negative.   SOCIAL HISTORY:  The patient does smoke about a pack per day, no alcohol  use.   PHYSICAL EXAMINATION:  GENERAL:  The patient is alert and oriented x3.  Somewhat uncomfortable, but not in acute distress.  VITAL SIGNS:  Temperature 96.8, blood pressure 116/71, pulse 70,  respirations 16.  HEENT:  Head is normocephalic, eyes are anicteric.  Pupils equal, round, and  reactive to light.  EOM's  are intact.  Pharynx; normal mucous membranes, not  dry, good dentition.  NECK:  Supple.  There are no masses and no thyromegaly.  LUNGS:  Normal respirations, clear to auscultation.  HEART:  Regular rhythm.  I hear no murmurs, rubs, or gallops.  Intact  carotids and femoral dorsalis pedis pulses.  ABDOMEN:  Soft, but definite right upper quadrant tenderness, although, no  guarding and no rigidity.  No masses or organomegaly are appreciated.  No  hernias.  Bowel sounds are present.  GENITOURINARY:  Unremarkable.  EXTREMITIES:  Good range of motion in both lower legs.  No cyanosis or  edema.   LABORATORY DATA:  Chest x-ray is negative.   White count is 11,500.  Urine shows specific gravity of 1.017, otherwise  negative.  Electrolytes are normal.  Liver function tests are normal.  EKG  is normal.   IMPRESSION:  1.  Acute cholecystitis.  2.  History of hypertension.  3.  History of hypercholesterolemia.  4.  History of reflux.   PLAN:  Given  that he has continued to have some discomfort and has a known  diagnosis of gallstones, I think we ought to proceed with laparoscopic  cholecystectomy.  He would like to go ahead today.  I have gone over  indications, risks, and complications including treating with antibiotics,  no surgery, and I have talked about surgical complications including  bleeding, infection, bile leaks, injury to other organs, etc.  I think all  questions have been answered and he would like to proceed.      CJS/MEDQ  D:  06/13/2004  T:  06/13/2004  Job:  [id number]

## 2010-09-19 ENCOUNTER — Other Ambulatory Visit: Payer: Self-pay | Admitting: *Deleted

## 2010-09-19 MED ORDER — COLESTIPOL HCL 1 G PO TABS: 1.0000 g | ORAL_TABLET | Freq: Two times a day (BID) | ORAL | Status: DC

## 2010-12-29 LAB — DIFFERENTIAL
Basophils Relative: 0
Lymphocytes Relative: 15
Lymphs Abs: 2.3
Monocytes Absolute: 2.3 — ABNORMAL HIGH
Monocytes Relative: 15 — ABNORMAL HIGH
Neutro Abs: 11 — ABNORMAL HIGH

## 2010-12-29 LAB — CBC
Hemoglobin: 15.7
MCHC: 34.6
RBC: 5.04
WBC: 15.8 — ABNORMAL HIGH

## 2011-03-12 ENCOUNTER — Telehealth: Payer: Self-pay | Admitting: Gastroenterology

## 2011-03-12 MED ORDER — PANCRELIPASE (LIP-PROT-AMYL) 25000 UNITS PO CPEP: 2.0000 | ORAL_CAPSULE | Freq: Three times a day (TID) | ORAL | Status: DC

## 2011-03-12 NOTE — Telephone Encounter (Signed)
rx sent

## 2011-04-24 ENCOUNTER — Telehealth: Payer: Self-pay | Admitting: Gastroenterology

## 2011-04-24 ENCOUNTER — Encounter: Payer: Self-pay | Admitting: Gastroenterology

## 2011-04-24 ENCOUNTER — Ambulatory Visit (INDEPENDENT_AMBULATORY_CARE_PROVIDER_SITE_OTHER): Payer: Medicare Other | Admitting: Gastroenterology

## 2011-04-24 VITALS — BP 128/64 | HR 60 | Ht 67.0 in | Wt 158.0 lb

## 2011-04-24 DIAGNOSIS — I10 Essential (primary) hypertension: Secondary | ICD-10-CM

## 2011-04-24 DIAGNOSIS — K861 Other chronic pancreatitis: Secondary | ICD-10-CM

## 2011-04-24 MED ORDER — COLESTIPOL HCL 1 G PO TABS: 1.0000 g | ORAL_TABLET | Freq: Two times a day (BID) | ORAL | Status: DC

## 2011-04-24 MED ORDER — PANCRELIPASE (LIP-PROT-AMYL) 25000 UNITS PO CPEP: 2.0000 | ORAL_CAPSULE | Freq: Three times a day (TID) | ORAL | Status: DC

## 2011-04-24 NOTE — Patient Instructions (Signed)
Your prescription(s) have been sent to you pharmacy.  Please make office visit for one year

## 2011-04-24 NOTE — Progress Notes (Signed)
This is a 71 year old Caucasian male with idiopathic chronic pancreatitis with chronic malabsorption that has all been alleviated by [name] by mouth pancreatic extract therapy and Colestid 1 g twice a day. He denies any abdominal pain, diarrhea, other gastrointestinal or general medical problems except for essential hypertension well controlled on daily Lisinopril. He is followed by Dr. Loma Sender in primary care. Patient is up-to-date on his colonoscopy and endoscopy exams.  Current Medications, Allergies, Past Medical History, Past Surgical History, Family History and Social History were reviewed in [employer] record.  Pertinent Review of Systems Negative  Assessment and Plan: Idiopathic chronic pancreatitis doing well on by mouth pancreatic extracts and daily Colestid. The patient no longer has GERD and is not all PPI therapy. He has gone back to work, and seems to be doing extremely well. Renewed his medications with yearly followup or when necessary as needed. Blood pressure today is normal at 128/64. BMI is 24.75. No diagnosis found.   Physical Exam: The appearing patient in no distress. I cannot appreciate stigmata of chronic liver disease. Chest is clear cardiac exam is unremarkable. He appears to be in a regular rhythm. There is no organomegaly, abdominal masses or tenderness. Bowel sounds are normal. Peripheral extremities are unremarkable mental status is normal. Rectal exam is deferred.

## 2011-04-24 NOTE — Telephone Encounter (Signed)
Advised that zenpep should have been for [id number].

## 2011-05-21 ENCOUNTER — Other Ambulatory Visit: Payer: Self-pay | Admitting: *Deleted

## 2011-05-21 MED ORDER — CYANOCOBALAMIN 1000 MCG/ML IJ SOLN: 1000.0000 ug | INTRAMUSCULAR | Status: DC

## 2012-04-15 ENCOUNTER — Other Ambulatory Visit: Payer: Self-pay | Admitting: *Deleted

## 2012-04-15 MED ORDER — CYANOCOBALAMIN 1000 MCG/ML IJ SOLN: 1000.0000 ug | INTRAMUSCULAR | Status: DC

## 2012-04-22 ENCOUNTER — Ambulatory Visit (INDEPENDENT_AMBULATORY_CARE_PROVIDER_SITE_OTHER): Payer: Medicare Other | Admitting: Gastroenterology

## 2012-04-22 ENCOUNTER — Encounter: Payer: Self-pay | Admitting: Gastroenterology

## 2012-04-22 VITALS — BP 116/62 | HR 71 | Ht 65.5 in | Wt 159.4 lb

## 2012-04-22 DIAGNOSIS — K861 Other chronic pancreatitis: Secondary | ICD-10-CM

## 2012-04-22 DIAGNOSIS — E538 Deficiency of other specified B group vitamins: Secondary | ICD-10-CM

## 2012-04-22 MED ORDER — PANCRELIPASE (LIP-PROT-AMYL) 25000 UNITS PO CPEP: 3.0000 | ORAL_CAPSULE | Freq: Three times a day (TID) | ORAL | Status: DC

## 2012-04-22 MED ORDER — COLESTIPOL HCL 1 G PO TABS: 1.0000 g | ORAL_TABLET | Freq: Two times a day (BID) | ORAL | Status: DC

## 2012-04-22 NOTE — Progress Notes (Signed)
This is a 72 year old Caucasian male with mild essential hypertension and hyperlipidemia.  He has idiopathic chronic pancreatitis and associated B12 malabsorption, and is currently asymptomatic on by mouth pancreatic extract administration.  He denies diarrhea, rectal bleeding, or any symptoms of malabsorption.  In addition to his pancreatic extracts he takes Colestid 1 g twice a day, Imodium 2 mg a day, and B12 injections monthly.  He is followed by Dr. Eldridge Scot.  Allegedly recent blood data was all normal there is no history of alcohol, cigarette, or NSAID abuse.  Family history is noncontributory.  Last colonoscopy was in February of 2011.  Current Medications, Allergies, Past Medical History, Past Surgical History, Family History and Social History were reviewed in [employer] record.  Pertinent Review of Systems Negative   Physical Exam: Healthy patient in no distress.  Blood pressure 116/62, pulse 71 and regular, and weight 159 with a BMI of 26.13.  I cannot appreciate stigmata of chronic liver disease.  Chest is clear he is in a regular rhythm without murmurs gallops or rubs.  There is no organomegaly, abdominal masses or tenderness.  Bowel sounds are normal.  Mental status is normal.  Peripheral extremities are unremarkable.    Assessment and Plan: No diagnosis found. continue same medications as outlined and reviewed.  Her request recent labs from his primary care physician.  He will continue yearly followup or when necessary as needed.  Patient is status post cholecystectomy.

## 2012-04-22 NOTE — Addendum Note (Signed)
Addended by: Marlowe Kays on: 04/22/2012 09:22 AM   Modules accepted: Orders

## 2012-04-22 NOTE — Patient Instructions (Addendum)
You have been given a separate informational sheet regarding your tobacco use, the importance of quitting and local resources to help you quit. We will send in your prescriptions to your pharmacy

## 2012-05-12 ENCOUNTER — Other Ambulatory Visit: Payer: Self-pay | Admitting: *Deleted

## 2012-05-12 NOTE — Telephone Encounter (Signed)
Refill request for Colestipol   Called [facility] because patients Colestipol was sent 04-22-2012 with 11 refills.  Pharmacy said disregard the fax for refill request, sent by mistake.

## 2012-07-28 ENCOUNTER — Emergency Department: Payer: Self-pay | Admitting: Unknown Physician Specialty

## 2012-07-28 LAB — CBC
HCT: 40.2 % (ref 40.0–52.0)
HGB: 13.7 g/dL (ref 13.0–18.0)
MCH: 30.5 pg (ref 26.0–34.0)
MCHC: 34.1 g/dL (ref 32.0–36.0)
MCV: 90 fL (ref 80–100)
Platelet: 243 10*3/uL (ref 150–440)
RBC: 4.48 10*6/uL (ref 4.40–5.90)
RDW: 14.2 % (ref 11.5–14.5)

## 2012-07-28 LAB — BASIC METABOLIC PANEL
Anion Gap: 5 — ABNORMAL LOW (ref 7–16)
BUN: 16 mg/dL (ref 7–18)
Calcium, Total: 8.9 mg/dL (ref 8.5–10.1)
Chloride: 107 mmol/L (ref 98–107)
Creatinine: 1.27 mg/dL (ref 0.60–1.30)
EGFR (African American): >60
Glucose: 114 mg/dL — ABNORMAL HIGH (ref 65–99)
Osmolality: 280 (ref 275–301)
Sodium: 139 mmol/L (ref 136–145)

## 2012-07-28 LAB — CK TOTAL AND CKMB (NOT AT ARMC)
CK, Total: 108 U/L (ref 35–232)
CK-MB: 1.3 ng/mL (ref 0.5–3.6)

## 2012-07-28 LAB — PRO B NATRIURETIC PEPTIDE: B-Type Natriuretic Peptide: 227 pg/mL — ABNORMAL HIGH (ref 0–125)

## 2012-08-03 LAB — CULTURE, BLOOD (SINGLE)

## 2013-03-17 ENCOUNTER — Other Ambulatory Visit: Payer: Self-pay | Admitting: Neurological Surgery

## 2013-03-17 DIAGNOSIS — M545 Low back pain: Secondary | ICD-10-CM

## 2013-03-18 ENCOUNTER — Ambulatory Visit
Admission: RE | Admit: 2013-03-18 | Discharge: 2013-03-18 | Disposition: A | Discharge status: Home / Self Care | Payer: Self-pay | Source: Ambulatory Visit | Attending: Neurological Surgery | Admitting: Neurological Surgery

## 2013-03-18 DIAGNOSIS — M545 Low back pain: Secondary | ICD-10-CM

## 2013-03-21 ENCOUNTER — Other Ambulatory Visit: Payer: Medicare Other

## 2013-04-15 ENCOUNTER — Telehealth: Payer: Self-pay | Admitting: *Deleted

## 2013-04-15 MED ORDER — CYANOCOBALAMIN 1000 MCG/ML IJ SOLN: 1000.0000 ug | INTRAMUSCULAR | Status: DC

## 2013-04-15 NOTE — Telephone Encounter (Signed)
RX REQUEST FAXED OVER, RX REFILLED

## 2013-05-22 ENCOUNTER — Other Ambulatory Visit: Payer: Self-pay | Admitting: Neurological Surgery

## 2013-05-22 DIAGNOSIS — M5126 Other intervertebral disc displacement, lumbar region: Secondary | ICD-10-CM

## 2013-05-25 ENCOUNTER — Ambulatory Visit
Admission: RE | Admit: 2013-05-25 | Discharge: 2013-05-25 | Disposition: A | Discharge status: Home / Self Care | Payer: Commercial Managed Care - HMO | Source: Ambulatory Visit | Attending: Neurological Surgery | Admitting: Neurological Surgery

## 2013-05-25 ENCOUNTER — Other Ambulatory Visit: Payer: Medicare Other

## 2013-05-25 DIAGNOSIS — M5126 Other intervertebral disc displacement, lumbar region: Secondary | ICD-10-CM

## 2013-05-25 MED ORDER — GADOBENATE DIMEGLUMINE 529 MG/ML IV SOLN
15.0000 mL | Freq: Once | INTRAVENOUS | Status: AC | PRN
  Administered 2013-05-25: 15 mL via INTRAVENOUS

## 2013-07-07 ENCOUNTER — Telehealth: Payer: Self-pay | Admitting: Gastroenterology

## 2013-07-07 ENCOUNTER — Telehealth: Payer: Self-pay | Admitting: Internal Medicine

## 2013-07-07 MED ORDER — PANCRELIPASE (LIP-PROT-AMYL) 25000 UNITS PO CPEP: 3.0000 | ORAL_CAPSULE | Freq: Three times a day (TID) | ORAL | Status: DC

## 2013-07-07 NOTE — Telephone Encounter (Signed)
Patient has a follow up visit with you on 08-26-13 Patient is requesting a refill on [name] Is it ok to refill?

## 2013-07-07 NOTE — Telephone Encounter (Signed)
Rx sent 

## 2013-07-07 NOTE — Telephone Encounter (Signed)
Okay to refill while he is waiting for followup

## 2013-07-08 NOTE — Telephone Encounter (Signed)
Spoke to pt told him to take the [name] the same way Dr. Sharlett Iles wrote it: three capfuls three time times a day. Pt verbalized understanding

## 2013-08-25 ENCOUNTER — Encounter: Payer: Self-pay | Admitting: Internal Medicine

## 2013-08-26 ENCOUNTER — Ambulatory Visit (INDEPENDENT_AMBULATORY_CARE_PROVIDER_SITE_OTHER): Payer: Medicare HMO | Admitting: Internal Medicine

## 2013-08-26 ENCOUNTER — Encounter: Payer: Self-pay | Admitting: Internal Medicine

## 2013-08-26 VITALS — BP 108/60 | HR 73 | Ht 60.75 in | Wt 165.0 lb

## 2013-08-26 DIAGNOSIS — K861 Other chronic pancreatitis: Secondary | ICD-10-CM

## 2013-08-26 DIAGNOSIS — E538 Deficiency of other specified B group vitamins: Secondary | ICD-10-CM

## 2013-08-26 MED ORDER — CYANOCOBALAMIN 1000 MCG/ML IJ SOLN: 1000.0000 ug | INTRAMUSCULAR | Status: DC

## 2013-08-26 NOTE — Patient Instructions (Signed)
Continue taking creaon as directed.  We have sent the following medications to your pharmacy for you to pick up at your convenience: B-12  Follow up in one year or sooner as needed

## 2013-08-26 NOTE — Progress Notes (Signed)
Subjective:    Patient ID: STEPHENSON CICHY, male    DOB: 1940/05/18, 73 y.o.   MRN: [id number]  HPI Mr. Wiedel is a 73 year old male previously followed by Dr. Sharlett Iles with a history of idiopathic chronic pancreatitis, hypertension, hyperlipidemia, and B12 malabsorption who is here for followup and to establish care with me. He is here alone today. He was worked up previously by Dr. Sharlett Iles for urgent diarrhea and was diagnosed with chronic idiopathic pancreatitis. Fortunately pain has not been a problem for him. He has remained on pancreatic enzymes with [name]  25,000 units 2 tablets 3 times daily with meals. He does also use loperamide a half to one tablet as needed for loose stools. He was previously taking colestipol but has stopped. He reports he is having 1-2 bowel movements per day, in the morning. Her first bowel movment is always formed the second one slightly loose. He denies diarrhea. No blood in the stool or melena. No abdominal pain. No nausea or vomiting. No hepatobiliary complaints. No nausea or vomiting. No fevers or chills. He is having issues with affording pancreatic enzymes and is nearing the Medicare prescription doughnut hole.  Stable weight and good appetite.  Review of Systems As per history of present illness, otherwise negative  Current Medications, Allergies, Past Medical History, Past Surgical History, Family History and Social History were reviewed in [employer] record.     Objective:   Physical Exam BP 108/60  Pulse 73  Ht 5' 0.75" (1.543 m)  Wt 165 lb (74.844 kg)  BMI 31.44 kg/m2 Constitutional: Well-developed and well-nourished. No distress. HEENT: Normocephalic and atraumatic. Oropharynx is clear and moist. No oropharyngeal exudate. Conjunctivae are normal.  No scleral icterus. Neck: Neck supple. Trachea midline. Cardiovascular: Normal rate, regular rhythm and intact distal pulses.  Pulmonary/chest: Effort normal and breath  sounds normal. No wheezing, rales or rhonchi. Abdominal: Soft, nontender, nondistended. Bowel sounds active throughout.  Extremities: no clubbing, cyanosis, or edema Lymphadenopathy: No cervical adenopathy noted. Neurological: Alert and oriented to person place and time. Skin: Skin is warm and dry. No rashes noted. Psychiatric: Normal mood and affect. Behavior is normal.  COMPLETE ABDOMINAL ULTRASOUND -- 2012    Comparison:  None.    Findings:    Gallbladder:  Surgically absent    Common bile duct:  3.9 mm    Liver:  No focal lesion identified.  Within normal limits in parenchymal echogenicity.    IVC:  Normal    Pancreas:  Within normal limits in size and contour.  No focal mass identified.  There may be several small calcifications within the pancreatic tissue, which could indicate chronic pancreatitis.  At this time there are no changes of acute pancreatitis.    Spleen:  Normal    Right Kidney:  11.9 cm.  No hydronephrosis or other pathology.    Left Kidney:  13.0 cm.  No pathology.    Abdominal aorta:  Normal    IMPRESSION:    1.  Post cholecystectomy. 2.  There may be a few small calcifications within the parenchyma of the pancreas but the pancreas is otherwise unremarkable sonographically. 3.  No other acute or significant findings.     Assessment & Plan:   73 year old male previously followed by Dr. Sharlett Iles with a history of idiopathic chronic pancreatitis, hypertension, hyperlipidemia, and B12 malabsorption who is here for followup and to establish care with me.  1.  Chronic idiopathic pancreatitis -- stearrhea was the major symptom and fortunately  he has never had to deal with abdominal pain.  We will continue pancreatic enzyme replacement. Perhaps Creon will be more affordable for him and we will prescribe 36,000 units 2 tablets 3 times daily with meals. He can continue to use Imodium on an as-needed basis. His weight has been stable and there is no  evidence of malabsorption or malnutrition at present.  2.  B12 deficiency -- continue IM B12 monthly  3.  CRC screening -- last colonoscopy 05/11/2009 -- no polyps or masses, random biopsies negative for microscopic colitis.  No history of colon polyps, average risk from screening perspective  Return in 1 yr, sooner if needed

## 2013-10-05 ENCOUNTER — Other Ambulatory Visit: Payer: Self-pay | Admitting: Gastroenterology

## 2013-10-05 ENCOUNTER — Telehealth: Payer: Self-pay | Admitting: Internal Medicine

## 2013-10-05 MED ORDER — PANCRELIPASE (LIP-PROT-AMYL) 25000 UNITS PO CPEP
3.0000 | ORAL_CAPSULE | Freq: Three times a day (TID) | ORAL | Status: DC
Start: 2013-10-05 — End: 2014-11-17

## 2013-10-16 NOTE — Telephone Encounter (Signed)
I spoke with [facility] and rx is correct.  They have dispensed to the patient

## 2014-03-08 ENCOUNTER — Telehealth: Payer: Self-pay | Admitting: Internal Medicine

## 2014-03-08 NOTE — Telephone Encounter (Signed)
Would Creon be any cheaper for him? We occasionally have vouchers for free month.  Okay to switch to Creon or could ask [name] rep for help

## 2014-03-08 NOTE — Telephone Encounter (Signed)
Pt states he checked with his insurance and Creon is more expensive. Vouchers will not help because he has medicare and we do not have samples. Pt is going to check with his pharmacy and see if it would be cheaper for him to take 2 pills tid instead of 3 tid and call us back if he needs a new script.

## 2014-03-08 NOTE — Telephone Encounter (Signed)
Pt is in doughnut hole and states he cannot afford his [name]. Pt wants to know if there is a cheaper alternative he could try. Pt also asked about samples but we do not have these. Please advise.

## 2014-05-17 ENCOUNTER — Emergency Department (INDEPENDENT_AMBULATORY_CARE_PROVIDER_SITE_OTHER)
Admission: EM | Admit: 2014-05-17 | Discharge: 2014-05-17 | Disposition: A | Discharge status: Home / Self Care | Payer: PPO | Source: Home / Self Care | Attending: Family Medicine | Admitting: Family Medicine

## 2014-05-17 ENCOUNTER — Emergency Department (INDEPENDENT_AMBULATORY_CARE_PROVIDER_SITE_OTHER): Payer: PPO

## 2014-05-17 ENCOUNTER — Encounter (HOSPITAL_COMMUNITY): Payer: Self-pay | Admitting: *Deleted

## 2014-05-17 DIAGNOSIS — J441 Chronic obstructive pulmonary disease with (acute) exacerbation: Secondary | ICD-10-CM

## 2014-05-17 MED ORDER — ALBUTEROL SULFATE HFA 108 (90 BASE) MCG/ACT IN AERS: 2.0000 | INHALATION_SPRAY | Freq: Four times a day (QID) | RESPIRATORY_TRACT | Status: DC | PRN

## 2014-05-17 MED ORDER — AZITHROMYCIN 250 MG PO TABS: 250.0000 mg | ORAL_TABLET | Freq: Every day | ORAL | Status: DC

## 2014-05-17 MED ORDER — IPRATROPIUM BROMIDE 0.06 % NA SOLN: 2.0000 | Freq: Four times a day (QID) | NASAL | Status: DC

## 2014-05-17 MED ORDER — PREDNISONE 50 MG PO TABS: 50.0000 mg | ORAL_TABLET | Freq: Every day | ORAL | Status: DC

## 2014-05-17 MED ORDER — IPRATROPIUM-ALBUTEROL 0.5-2.5 (3) MG/3ML IN SOLN
3.0000 mL | Freq: Once | RESPIRATORY_TRACT | Status: AC
  Administered 2014-05-17: 3 mL via RESPIRATORY_TRACT

## 2014-05-17 MED ORDER — IPRATROPIUM-ALBUTEROL 0.5-2.5 (3) MG/3ML IN SOLN
RESPIRATORY_TRACT | Status: AC
  Filled 2014-05-17: qty 3

## 2014-05-17 NOTE — Discharge Instructions (Signed)
Thank you for coming in today. Take the medications as directed. Follow-up with primary care provider. Please quit smoking. Call or go to the emergency room if you get worse, have trouble breathing, have chest pains, or palpitations.    Chronic Asthmatic Bronchitis Chronic asthmatic bronchitis is a complication of persistent asthma. After a period of time with asthma, some people develop airflow obstruction that is present all the time, even when not having an asthma attack.There is also persistent inflammation of the airways, and the bronchial tubes produce more mucus. Chronic asthmatic bronchitis usually is a permanent problem with the lungs. CAUSES  Chronic asthmatic bronchitis happens most often in people who have asthma and also smoke cigarettes. Occasionally, it can happen to a person with long-standing or severe asthma even if the person is not a smoker. SIGNS AND SYMPTOMS  Chronic asthmatic bronchitis usually causes symptoms of both asthma and chronic bronchitis, including:   Coughing.  Increased sputum production.  Wheezing and shortness of breath.  Chest discomfort.  Recurring infections. DIAGNOSIS  Your health care provider will take a medical history and perform a physical exam. Chronic asthmatic bronchitis is suspected when a person with asthma has abnormal results on breathing tests (pulmonary function tests) even when breathing symptoms are at their best. Other tests, such as a chest X-ray, may be performed to rule out other conditions.  TREATMENT  Treatment involves controlling symptoms with medicine and lifestyle changes.  Your health care provider may prescribe asthma medicines, including inhaler and nebulizer medicines.  Infection can be treated with medicine to kill germs (antibiotics). Serious infections may require hospitalization. These can include:  Pneumonia.  Sinus infections.  Acute bronchitis.   Preventing infection and hospitalization is very  important. Get an influenza vaccination every year as directed by your health care provider. Ask your health care provider whether you need a pneumonia vaccine.  Ask your health care provider whether you would benefit from a pulmonary rehabilitation program. HOME CARE INSTRUCTIONS  Take medicines only as directed by your health care provider.  If you are a cigarette smoker, the most important thing that you can do is quit. Talk to your health care provider for help with quitting smoking.  Avoid pollen, dust, animal dander, molds, smoke, and other things that cause attacks.  Regular exercise is very important to help you feel better. Discuss possible exercise routines with your health care provider.  If animal dander is the cause of asthma, you may not be able to keep pets.  It is important that you:  Become educated about your medical condition.  Participate in maintaining wellness.  Seek medical care as directed. Delay in seeking medical care could cause permanent injury and may be a risk to your life. SEEK MEDICAL CARE IF:  You have wheezing and shortness of breath even if taking medicine to prevent attacks.  You have muscle aches, chest pain, or thickening of sputum.  Your sputum changes from clear or white to yellow, green, gray, or bloody. SEEK IMMEDIATE MEDICAL CARE IF:  Your usual medicines do not stop your wheezing.  You have increased coughing or shortness of breath or both.  You have increased difficulty breathing.  You have any problems from the medicine you are taking, such as a rash, itching, swelling, or trouble breathing. MAKE SURE YOU:   Understand these instructions.  Will watch your condition.  Will get help right away if you are not doing well or get worse. Document Released: 01/11/2006 Document Revised: 08/10/2013 Document  Reviewed: 05/04/2013 ExitCare Patient Information 2015 [place], [place]. This information is not intended to replace advice given  to you by your health care provider. Make sure you discuss any questions you have with your health care provider.

## 2014-05-17 NOTE — ED Notes (Signed)
Pt  Reports      Symptoms  Of  A  persistant  Cough     For   sev  Months          Productive  At times     - he  Is  Sitting upright on  The  Exam table  And  He  Is  Speaking in  Complete  sentances  And  Is  In no  Severe  Distress

## 2014-05-17 NOTE — ED Provider Notes (Signed)
Cody Willis is a 74 y.o. male who presents to Urgent Care today for cough. Patient has a two-month history of cough with wheezing over the last 2 weeks. Sometimes the cough is productive. He also notes some hoarseness. His symptoms are not consistent with previous episodes of pneumonia. Patient has tried some over-the-counter medications which have helped a bit. He is a current smoker.   Past Medical History  Diagnosis Date  . Hiatal hernia   . Esophageal reflux   . Chronic pancreatitis   . Other B-complex deficiencies   . Essential hypertension, benign   . Pure hypercholesterolemia    Past Surgical History  Procedure Laterality Date  . Appendectomy    . Cholecystectomy    . Back surgery      between L4 and L5   History  Substance Use Topics  . Smoking status: Current Every Day Smoker -- 1.00 packs/day    Types: Cigarettes  . Smokeless tobacco: Never Used     Comment: Counseling sheet given in exam room to quit smoking   . Alcohol Use: No   ROS as above Medications: No current facility-administered medications for this encounter.   Current Outpatient Prescriptions  Medication Sig Dispense Refill  . albuterol (PROVENTIL HFA;VENTOLIN HFA) 108 (90 BASE) MCG/ACT inhaler Inhale 2 puffs into the lungs every 6 (six) hours as needed for wheezing or shortness of breath. 1 Inhaler 2  . azithromycin (ZITHROMAX) 250 MG tablet Take 1 tablet (250 mg total) by mouth daily. Take first 2 tablets together, then 1 every day until finished. 6 tablet 0  . colestipol (COLESTID) 1 G tablet Take 1 tablet (1 g total) by mouth 2 (two) times daily. 60 tablet 11  . cyanocobalamin (,VITAMIN B-12,) 1000 MCG/ML injection Inject 1 mL (1,000 mcg total) into the muscle every 30 (thirty) days. 10 mL 12  . gemfibrozil (LOPID) 600 MG tablet Take 600 mg by mouth 2 (two) times daily.    Marland Kitchen ipratropium (ATROVENT) 0.06 % nasal spray Place 2 sprays into both nostrils 4 (four) times daily. 15 mL 1  . lisinopril  (PRINIVIL,ZESTRIL) 20 MG tablet Take 20 mg by mouth daily.    Marland Kitchen loperamide (IMODIUM A-D) 2 MG tablet Take 2 mg by mouth as needed.    . lovastatin (MEVACOR) 40 MG tablet Take 40 mg by mouth at bedtime.    . Pancrelipase, Lip-Prot-Amyl, ([name]) 25000 UNITS CPEP Take 3 capsules by mouth 3 (three) times daily. 270 capsule 3  . predniSONE (DELTASONE) 50 MG tablet Take 1 tablet (50 mg total) by mouth daily. 5 tablet 0   Allergies  Allergen Reactions  . Sulfamethoxazole-Trimethoprim Rash     Exam:  BP 137/63 mmHg  Pulse 64  Temp(Src) 97.7 F (36.5 C) (Oral)  Resp 16  SpO2 98% Gen: Well NAD HEENT: EOMI,  MMM posterior pharynx with cobblestoning. Normal tympanic membranes bilaterally. Lungs: Normal work of breathing. Wheezing present bilaterally. Heart: RRR no MRG Abd: NABS, Soft. Nondistended, Nontender Exts: Brisk capillary refill, warm and well perfused.   Patient was given a 2.5/0.5 mg DuoNeb nebulizer treatment, and felt a little better  No results found for this or any previous visit (from the past 24 hour(s)). Dg Chest 2 View  05/17/2014   CLINICAL DATA:  Cough for 2 months.  EXAM: CHEST  2 VIEW  COMPARISON:  PA and lateral chest 07/28/2012.  FINDINGS: The lungs are clear. Heart size is normal. There is no pneumothorax or pleural effusion.  IMPRESSION: Negative  chest.   Electronically Signed   By: Inge Rise M.D.   On: 05/17/2014 11:58    Assessment and Plan: 74 y.o. male with bronchitis likely resulting from virus and smoking. Patient may have COPD. Treat with prednisone and azithromycin albuterol and Atrovent nasal spray. Return as needed. Follow-up with PCP. Encouraged patient to quit smoking.  Discussed warning signs or symptoms. Please see discharge instructions. Patient expresses understanding.     Gregor Hams, MD 05/17/14 [phone]

## 2014-08-17 ENCOUNTER — Encounter: Payer: Self-pay | Admitting: Internal Medicine

## 2014-08-17 ENCOUNTER — Ambulatory Visit (INDEPENDENT_AMBULATORY_CARE_PROVIDER_SITE_OTHER): Payer: PPO | Admitting: Internal Medicine

## 2014-08-17 VITALS — BP 120/70 | HR 68 | Temp 97.9°F | Ht 66.0 in | Wt 157.0 lb

## 2014-08-17 DIAGNOSIS — I10 Essential (primary) hypertension: Secondary | ICD-10-CM

## 2014-08-17 DIAGNOSIS — K861 Other chronic pancreatitis: Secondary | ICD-10-CM | POA: Diagnosis not present

## 2014-08-17 DIAGNOSIS — E7800 Pure hypercholesterolemia, unspecified: Secondary | ICD-10-CM

## 2014-08-17 DIAGNOSIS — K219 Gastro-esophageal reflux disease without esophagitis: Secondary | ICD-10-CM | POA: Diagnosis not present

## 2014-08-17 DIAGNOSIS — J431 Panlobular emphysema: Secondary | ICD-10-CM | POA: Insufficient documentation

## 2014-08-17 DIAGNOSIS — J439 Emphysema, unspecified: Secondary | ICD-10-CM

## 2014-08-17 DIAGNOSIS — J449 Chronic obstructive pulmonary disease, unspecified: Secondary | ICD-10-CM | POA: Insufficient documentation

## 2014-08-17 LAB — COMPREHENSIVE METABOLIC PANEL
ALK PHOS: 54 U/L (ref 39–117)
ALT: 11 U/L (ref 0–53)
AST: 22 U/L (ref 0–37)
Albumin: 4.6 g/dL (ref 3.5–5.2)
BILIRUBIN TOTAL: 0.9 mg/dL (ref 0.2–1.2)
BUN: 24 mg/dL — ABNORMAL HIGH (ref 6–23)
CO2: 29 mEq/L (ref 19–32)
Calcium: 9.7 mg/dL (ref 8.4–10.5)
Chloride: 101 mEq/L (ref 96–112)
Creatinine, Ser: 1.3 mg/dL (ref 0.40–1.50)
GFR: 57.48 mL/min — AB (ref >60.00)
GLUCOSE: 86 mg/dL (ref 70–99)
Potassium: 4 mEq/L (ref 3.5–5.1)
SODIUM: 137 meq/L (ref 135–145)
Total Protein: 7.3 g/dL (ref 6.0–8.3)

## 2014-08-17 LAB — CBC WITH DIFFERENTIAL/PLATELET
Basophils Absolute: 0.1 10*3/uL (ref 0.0–0.1)
Basophils Relative: 0.6 % (ref 0.0–3.0)
EOS ABS: 0.2 10*3/uL (ref 0.0–0.7)
Eosinophils Relative: 2 % (ref 0.0–5.0)
HCT: 45 % (ref 39.0–52.0)
Hemoglobin: 15.4 g/dL (ref 13.0–17.0)
LYMPHS ABS: 2.9 10*3/uL (ref 0.7–4.0)
Lymphocytes Relative: 31.4 % (ref 12.0–46.0)
MCHC: 34.4 g/dL (ref 30.0–36.0)
MCV: 89.2 fl (ref 78.0–100.0)
Monocytes Absolute: 1.3 10*3/uL — ABNORMAL HIGH (ref 0.1–1.0)
Monocytes Relative: 14.1 % — ABNORMAL HIGH (ref 3.0–12.0)
Neutro Abs: 4.9 10*3/uL (ref 1.4–7.7)
Neutrophils Relative %: 51.9 % (ref 43.0–77.0)
PLATELETS: 210 10*3/uL (ref 150.0–400.0)
RBC: 5.04 Mil/uL (ref 4.22–5.81)
RDW: 15 % (ref 11.5–15.5)
WBC: 9.4 10*3/uL (ref 4.0–10.5)

## 2014-08-17 LAB — LIPID PANEL
CHOLESTEROL: 149 mg/dL (ref 0–200)
HDL: 44.2 mg/dL (ref >39.00)
LDL CALC: 89 mg/dL (ref 0–99)
NonHDL: 104.8
TRIGLYCERIDES: 79 mg/dL (ref 0.0–149.0)
Total CHOL/HDL Ratio: 3
VLDL: 15.8 mg/dL (ref 0.0–40.0)

## 2014-08-17 LAB — T4, FREE: Free T4: 0.68 ng/dL (ref 0.60–1.60)

## 2014-08-17 MED ORDER — LISINOPRIL 20 MG PO TABS: 20.0000 mg | ORAL_TABLET | Freq: Every day | ORAL | Status: DC

## 2014-08-17 MED ORDER — LOVASTATIN 40 MG PO TABS: 40.0000 mg | ORAL_TABLET | Freq: Every day | ORAL | Status: DC

## 2014-08-17 NOTE — Assessment & Plan Note (Signed)
Does well with the enzymes and occasional imodium

## 2014-08-17 NOTE — Assessment & Plan Note (Signed)
BP Readings from Last 3 Encounters:  08/17/14 120/70  05/17/14 137/63  08/26/13 108/60   Good control No changes needed

## 2014-08-17 NOTE — Progress Notes (Signed)
Pre visit review using our clinic review tool, if applicable. No additional management support is needed unless otherwise documented below in the visit note. 

## 2014-08-17 NOTE — Progress Notes (Signed)
Subjective:    Patient ID: Cody Willis, male    DOB: 1941-02-14, 74 y.o.   MRN: [id number]  HPI Here to establish care  Had been seeing Dr Hardin Negus Transferring care  Some issues with chronic pancreatitis Had frequent stools Now controlled with the pancreatic enzymes and uses imodium prn Feels back to normal  HTN goes back some years Doing fine on the current Rx No headaches No chest pain or SOB Did have bronchitis a while back-- got over that It took 3 months though No problems the year before  Still smoking but he doesn't inhale 1PPD though Has stopped twice in his life He uses it for stress though  On meds for cholesterol for some time No problems with that  Acid reflux Controlled on pepcid ac No swallowing problems Occasional trouble swallowing big pills--like the gemfibrozil  Current Outpatient Prescriptions on File Prior to Visit  Medication Sig Dispense Refill  . cyanocobalamin (,VITAMIN B-12,) 1000 MCG/ML injection Inject 1 mL (1,000 mcg total) into the muscle every 30 (thirty) days. 10 mL 12  . gemfibrozil (LOPID) 600 MG tablet Take 600 mg by mouth 2 (two) times daily.    Marland Kitchen lisinopril (PRINIVIL,ZESTRIL) 20 MG tablet Take 20 mg by mouth daily.    Marland Kitchen loperamide (IMODIUM A-D) 2 MG tablet Take 2 mg by mouth as needed.    . lovastatin (MEVACOR) 40 MG tablet Take 40 mg by mouth at bedtime.    . Pancrelipase, Lip-Prot-Amyl, ([name]) 25000 UNITS CPEP Take 3 capsules by mouth 3 (three) times daily. 270 capsule 3   No current facility-administered medications on file prior to visit.    Allergies  Allergen Reactions  . Sulfamethoxazole-Trimethoprim Rash    Past Medical History  Diagnosis Date  . Hiatal hernia   . Esophageal reflux   . Chronic pancreatitis   . Other B-complex deficiencies   . Essential hypertension, benign   . Pure hypercholesterolemia     Past Surgical History  Procedure Laterality Date  . Appendectomy    . Cholecystectomy    .  Back surgery      Diskectomy between L4 and L5    Family History  Problem Relation Age of Onset  . Crohn's disease Sister   . Colitis Brother   . Colon cancer Neg Hx     History   Social History  . Marital Status: Married    Spouse Name: N/A  . Number of Children: 2  . Years of Education: N/A   Occupational History  . Truck driver-- local     part time   Social History Main Topics  . Smoking status: Current Every Day Smoker -- 1.00 packs/day    Types: Cigarettes  . Smokeless tobacco: Never Used     Comment: Counseling sheet given in exam room to quit smoking   . Alcohol Use: No  . Drug Use: No  . Sexual Activity: Not on file   Other Topics Concern  . Not on file   Social History Narrative   No living will   Wife should make decisions for him if needed   Review of Systems  Constitutional: Negative for fatigue and unexpected weight change.  HENT: Positive for tinnitus and trouble swallowing. Negative for hearing loss.        Occ tinnitus  Eyes: Negative for visual disturbance.  Respiratory: Positive for cough. Negative for shortness of breath.   Cardiovascular: Negative for chest pain, palpitations and leg swelling.  Gastrointestinal: Negative for  nausea, vomiting, abdominal pain, constipation and blood in stool.  Endocrine: Negative for polydipsia and polyuria.  Genitourinary: Positive for frequency.       No sex-- no problem  Musculoskeletal: Positive for arthralgias. Negative for back pain.       Some left shoulder and left knee pain--no meds but uses OTC pain patch with good effect  Skin: Negative for rash.       Keeps up with Dr Nehemiah Massed  Allergic/Immunologic: Negative for environmental allergies and immunocompromised state.  Neurological: Negative for dizziness, syncope, light-headedness and headaches.  Hematological: Negative for adenopathy. Bruises/bleeds easily.  Psychiatric/Behavioral: Negative for sleep disturbance and dysphoric mood.         Objective:   Physical Exam  Constitutional: He appears well-developed and well-nourished. No distress.  HENT:  Mouth/Throat: Oropharynx is clear and moist. No oropharyngeal exudate.  Full dentures  Neck: Normal range of motion. Neck supple. No thyromegaly present.  Cardiovascular: Normal rate, regular rhythm, normal heart sounds and intact distal pulses.  Exam reveals no gallop.   No murmur heard. Pulmonary/Chest: Effort normal. No respiratory distress. He has wheezes. He has no rales.  Some exp wheeze but not really tight  Abdominal: Soft. There is no tenderness.  Musculoskeletal: He exhibits no edema or tenderness.  Lymphadenopathy:    He has no cervical adenopathy.  Skin: No rash noted. No erythema.  Cyst along right flank  Psychiatric: He has a normal mood and affect. His behavior is normal.          Assessment & Plan:

## 2014-08-17 NOTE — Assessment & Plan Note (Signed)
Chronic wheeze Discussed stopping the smoking again!

## 2014-08-17 NOTE — Assessment & Plan Note (Signed)
Good with primary prevention Triglycerides were 99 back in 2014--not high enough to warrant Rx with gemfibrozil (will stop)

## 2014-08-17 NOTE — Assessment & Plan Note (Signed)
Does okay with occasional pepcid AC

## 2014-08-17 NOTE — Patient Instructions (Signed)
Please call 1-800 QUIT NOW for advice to quit smoking.

## 2014-08-18 ENCOUNTER — Encounter: Payer: Self-pay | Admitting: *Deleted

## 2014-09-14 ENCOUNTER — Encounter: Payer: Self-pay | Admitting: Gastroenterology

## 2014-11-17 ENCOUNTER — Telehealth: Payer: Self-pay | Admitting: Internal Medicine

## 2014-11-17 MED ORDER — PANCRELIPASE (LIP-PROT-AMYL) 25000 UNITS PO CPEP: 3.0000 | ORAL_CAPSULE | Freq: Three times a day (TID) | ORAL | Status: DC

## 2014-11-17 NOTE — Telephone Encounter (Signed)
Rx sent 

## 2015-01-16 ENCOUNTER — Emergency Department (HOSPITAL_COMMUNITY): Payer: PPO

## 2015-01-16 ENCOUNTER — Encounter (HOSPITAL_COMMUNITY): Payer: Self-pay | Admitting: Emergency Medicine

## 2015-01-16 ENCOUNTER — Inpatient Hospital Stay (HOSPITAL_COMMUNITY)
Admission: EM | Admit: 2015-01-16 | Discharge: 2015-01-18 | DRG: 064 | Disposition: A | Discharge status: Home / Self Care | Payer: PPO | Attending: Internal Medicine | Admitting: Internal Medicine

## 2015-01-16 DIAGNOSIS — K219 Gastro-esophageal reflux disease without esophagitis: Secondary | ICD-10-CM | POA: Diagnosis present

## 2015-01-16 DIAGNOSIS — Z9842 Cataract extraction status, left eye: Secondary | ICD-10-CM

## 2015-01-16 DIAGNOSIS — R42 Dizziness and giddiness: Secondary | ICD-10-CM | POA: Diagnosis not present

## 2015-01-16 DIAGNOSIS — E782 Mixed hyperlipidemia: Secondary | ICD-10-CM | POA: Insufficient documentation

## 2015-01-16 DIAGNOSIS — Z85820 Personal history of malignant melanoma of skin: Secondary | ICD-10-CM

## 2015-01-16 DIAGNOSIS — F17210 Nicotine dependence, cigarettes, uncomplicated: Secondary | ICD-10-CM | POA: Diagnosis present

## 2015-01-16 DIAGNOSIS — I10 Essential (primary) hypertension: Secondary | ICD-10-CM | POA: Diagnosis present

## 2015-01-16 DIAGNOSIS — I639 Cerebral infarction, unspecified: Secondary | ICD-10-CM

## 2015-01-16 DIAGNOSIS — Z9841 Cataract extraction status, right eye: Secondary | ICD-10-CM

## 2015-01-16 DIAGNOSIS — Z961 Presence of intraocular lens: Secondary | ICD-10-CM | POA: Diagnosis present

## 2015-01-16 DIAGNOSIS — F17200 Nicotine dependence, unspecified, uncomplicated: Secondary | ICD-10-CM | POA: Insufficient documentation

## 2015-01-16 DIAGNOSIS — I7774 Dissection of vertebral artery: Secondary | ICD-10-CM | POA: Diagnosis present

## 2015-01-16 DIAGNOSIS — E785 Hyperlipidemia, unspecified: Secondary | ICD-10-CM | POA: Diagnosis present

## 2015-01-16 DIAGNOSIS — K861 Other chronic pancreatitis: Secondary | ICD-10-CM | POA: Diagnosis present

## 2015-01-16 DIAGNOSIS — I63212 Cerebral infarction due to unspecified occlusion or stenosis of left vertebral arteries: Principal | ICD-10-CM | POA: Diagnosis present

## 2015-01-16 DIAGNOSIS — Z79899 Other long term (current) drug therapy: Secondary | ICD-10-CM

## 2015-01-16 HISTORY — DX: Cerebral infarction, unspecified: I63.9

## 2015-01-16 LAB — PROTIME-INR
INR: 1.03 (ref 0.00–1.49)
Prothrombin Time: 13.7 seconds (ref 11.6–15.2)

## 2015-01-16 LAB — URINE MICROSCOPIC-ADD ON

## 2015-01-16 LAB — BASIC METABOLIC PANEL
ANION GAP: 9 (ref 5–15)
BUN: 20 mg/dL (ref 6–20)
CALCIUM: 9.4 mg/dL (ref 8.9–10.3)
CO2: 29 mmol/L (ref 22–32)
Chloride: 104 mmol/L (ref 101–111)
Creatinine, Ser: 1.17 mg/dL (ref 0.61–1.24)
GFR calc Af Amer: >60 mL/min (ref >60)
GFR calc non Af Amer: >60 mL/min (ref >60)
GLUCOSE: 116 mg/dL — AB (ref 65–99)
POTASSIUM: 4.1 mmol/L (ref 3.5–5.1)
Sodium: 142 mmol/L (ref 135–145)

## 2015-01-16 LAB — CBG MONITORING, ED: Glucose-Capillary: 104 mg/dL — ABNORMAL HIGH (ref 65–99)

## 2015-01-16 LAB — URINALYSIS, ROUTINE W REFLEX MICROSCOPIC
Bilirubin Urine: NEGATIVE
GLUCOSE, UA: NEGATIVE mg/dL
Ketones, ur: NEGATIVE mg/dL
Leukocytes, UA: NEGATIVE
Nitrite: NEGATIVE
PH: 5.5 (ref 5.0–8.0)
PROTEIN: NEGATIVE mg/dL
Specific Gravity, Urine: >1.03 — ABNORMAL HIGH (ref 1.005–1.030)
Urobilinogen, UA: 0.2 mg/dL (ref 0.0–1.0)

## 2015-01-16 LAB — CBC
HCT: 44.6 % (ref 39.0–52.0)
HEMOGLOBIN: 15.2 g/dL (ref 13.0–17.0)
MCH: 30.2 pg (ref 26.0–34.0)
MCHC: 34.1 g/dL (ref 30.0–36.0)
MCV: 88.7 fL (ref 78.0–100.0)
Platelets: 154 10*3/uL (ref 150–400)
RBC: 5.03 MIL/uL (ref 4.22–5.81)
RDW: 14.7 % (ref 11.5–15.5)
WBC: 8.3 10*3/uL (ref 4.0–10.5)

## 2015-01-16 MED ORDER — SODIUM CHLORIDE 0.9 % IV BOLUS (SEPSIS)
1000.0000 mL | Freq: Once | INTRAVENOUS | Status: AC
  Administered 2015-01-16: 1000 mL via INTRAVENOUS

## 2015-01-16 MED ORDER — ASPIRIN 81 MG PO CHEW
324.0000 mg | CHEWABLE_TABLET | Freq: Once | ORAL | Status: AC
  Administered 2015-01-16: 324 mg via ORAL
  Filled 2015-01-16: qty 4

## 2015-01-16 NOTE — ED Notes (Signed)
The pt is waiting for  Neuro to see.  The pt  Is c/o a soreness to the back of his head.  Family at the bedside.  Speech clear moving all 4 extremities.  comfortable

## 2015-01-16 NOTE — ED Notes (Signed)
Pt transported to MRI 

## 2015-01-16 NOTE — ED Provider Notes (Signed)
CSN: [id number]     Arrival date & time 01/16/15  1705 History   First MD Initiated Contact with Patient 01/16/15 1715     Chief Complaint  Patient presents with  . Near Syncope  . Emesis   Cody Willis is a 74 y.o. male with past medical history significant for hypertension, hypercholesterolemia, chronic pancreatitis who presents for presyncope, dizziness, and nausea with vomiting. The [place] had sudden onset of presyncope and vertigo. The patient describes a "world was spinning sensation" and he initially fell forward and fell backwards onto the grass. The patient said he did hit the back of his head on the ground. The patient says that the spinning continued for approximately 20-30 minutes and then it resolves. The patient reports she then was with EMS when he had a second episode causing him to have several episodes of nausea and vomiting. The patient denied any chest pain, shortness of breath, palpitations, or any complete loss of consciousness. The patient denies any fevers, chills, abdominal pain, constipation, or diarrhea. The patient says he thinks he urinated less yesterday than normal but denied any change in urine color, smell, or dysuria. The patient denies any recent medication changes. The patient says he has never had a history of dizziness or vertigo in the past. The patient does report a mild occipital headache. The patient reports a chronic mild cough and he does smoke cigarettes.   (Consider location/radiation/quality/duration/timing/severity/associated sxs/prior Treatment) Patient is a 74 y.o. male presenting with dizziness. The history is provided by the patient and a relative. No language interpreter was used.  Dizziness Quality:  Head spinning, lightheadedness and room spinning Severity:  Severe Onset quality:  Sudden Duration:  2 hours Timing:  Intermittent Progression:  Resolved Chronicity:  New Context: not with loss of  consciousness   Relieved by:  Nothing Worsened by:  Nothing Ineffective treatments:  None tried Associated symptoms: headaches, nausea and vomiting   Associated symptoms: no chest pain, no diarrhea, no palpitations, no shortness of breath, no syncope, no vision changes and no weakness   Headaches:    Severity:  Mild   Onset quality:  Gradual   Duration:  2 hours   Timing:  Constant   Chronicity:  New Nausea:    Severity:  Severe   Onset quality:  Gradual   Duration:  2 hours   Timing:  Intermittent   Progression:  Resolved Vomiting:    Quality:  Stomach contents Risk factors: no hx of stroke and no new medications     Past Medical History  Diagnosis Date  . Hiatal hernia   . Esophageal reflux   . Chronic pancreatitis ([place])   . Other B-complex deficiencies   . Essential hypertension, benign   . Pure hypercholesterolemia    Past Surgical History  Procedure Laterality Date  . Appendectomy    . Cholecystectomy    . Back surgery      Diskectomy between L4 and L5  . Cataract extraction w/ intraocular lens  implant, bilateral  12/15 and 2/16  . Melanoma excision  ~2009    right upper abdomen   Family History  Problem Relation Age of Onset  . Crohn's disease Sister   . Colitis Brother   . Colon cancer Neg Hx    Social History  Substance Use Topics  . Smoking status: Current Every Day Smoker -- 1.00 packs/day    Types: Cigarettes  . Smokeless tobacco: Never Used  Comment: Counseling sheet given in exam room to quit smoking   . Alcohol Use: No    Review of Systems  Constitutional: Negative for fever, chills, diaphoresis and fatigue.  HENT: Negative for congestion and rhinorrhea.   Eyes: Negative for visual disturbance.  Respiratory: Negative for choking, chest tightness, shortness of breath, wheezing and stridor.   Cardiovascular: Negative for chest pain, palpitations, leg swelling and syncope.  Gastrointestinal: Positive for nausea and vomiting. Negative for  diarrhea and constipation.  Genitourinary: Positive for decreased urine volume. Negative for dysuria and difficulty urinating.  Musculoskeletal: Negative for back pain, neck pain and neck stiffness.  Skin: Negative for rash and wound.  Neurological: Positive for dizziness, light-headedness and headaches. Negative for tremors, seizures, facial asymmetry, speech difficulty and weakness.  Psychiatric/Behavioral: Negative for confusion and agitation.  All other systems reviewed and are negative.     Allergies  Sulfamethoxazole-trimethoprim  Home Medications   Prior to Admission medications   Medication Sig Start Date End Date Taking? Authorizing Provider  cyanocobalamin (,VITAMIN B-12,) 1000 MCG/ML injection Inject 1 mL (1,000 mcg total) into the muscle every 30 (thirty) days. 08/26/13   Jerene Bears, MD  famotidine (PEPCID) 20 MG tablet Take 20 mg by mouth 2 (two) times daily as needed for heartburn or indigestion.    Historical Provider, MD  lisinopril (PRINIVIL,ZESTRIL) 20 MG tablet Take 1 tablet (20 mg total) by mouth daily. 08/17/14   Venia Carbon, MD  loperamide (IMODIUM A-D) 2 MG tablet Take 2 mg by mouth as needed.    Historical Provider, MD  lovastatin (MEVACOR) 40 MG tablet Take 1 tablet (40 mg total) by mouth at bedtime. 08/17/14   Venia Carbon, MD  Pancrelipase, Lip-Prot-Amyl, ([name]) 25000 UNITS CPEP Take 3 capsules by mouth 3 (three) times daily. 11/17/14   Jerene Bears, MD   BP 133/60 mmHg  Pulse 84  Temp(Src) 97.2 F (36.2 C) (Oral)  Resp 16  Ht 5\' 7"  (1.702 m)  Wt 165 lb (74.844 kg)  BMI 25.84 kg/m2  SpO2 97% Physical Exam  Constitutional: He is oriented to person, place, and time. He appears well-developed and well-nourished. No distress.  HENT:  Head: Normocephalic and atraumatic.  Mouth/Throat: No oropharyngeal exudate.  Eyes: Conjunctivae and EOM are normal. Pupils are equal, round, and reactive to light.  Neck: Normal range of motion.  Cardiovascular:  Normal rate, regular rhythm, normal heart sounds and intact distal pulses.   No murmur heard. Pulmonary/Chest: Effort normal and breath sounds normal. No stridor. No respiratory distress. He has no wheezes. He exhibits no tenderness.  Abdominal: Soft. Bowel sounds are normal. He exhibits no distension. There is no tenderness. There is no rebound.  Musculoskeletal: He exhibits no tenderness.  Neurological: He is alert and oriented to person, place, and time. He is not disoriented. He displays no tremor and normal reflexes. No cranial nerve deficit or sensory deficit. He exhibits normal muscle tone. Coordination normal. GCS eye subscore is 4. GCS verbal subscore is 5. GCS motor subscore is 6.  Skin: Skin is warm. He is not diaphoretic. No erythema.  Psychiatric: He has a normal mood and affect.  Nursing note and vitals reviewed.   ED Course  Procedures (including critical care time) Labs Review Labs Reviewed  BASIC METABOLIC PANEL - Abnormal; Notable for the following:    Glucose, Bld 116 (*)    All other components within normal limits  URINALYSIS, ROUTINE W REFLEX MICROSCOPIC (NOT AT [facility]) - Abnormal; Notable for  the following:    Specific Gravity, Urine >1.030 (*)    Hgb urine dipstick TRACE (*)    All other components within normal limits  URINE MICROSCOPIC-ADD ON - Abnormal; Notable for the following:    Casts HYALINE CASTS (*)    All other components within normal limits  CBG MONITORING, ED - Abnormal; Notable for the following:    Glucose-Capillary 104 (*)    All other components within normal limits  CBC  PROTIME-INR    Imaging Review Dg Chest 2 View  01/16/2015   CLINICAL DATA:  Sun onset of dizziness.  EXAM: CHEST  2 VIEW  COMPARISON:  05/17/2014  FINDINGS: Cardiomediastinal silhouette is normal. Mediastinal contours appear intact.  There is no evidence of focal airspace consolidation, pleural effusion or pneumothorax.  Osseous structures are without acute abnormality. Soft  tissues are grossly normal.  IMPRESSION: No radiographic evidence of acute cardiopulmonary abnormality.   Electronically Signed   By: Fidela Salisbury M.D.   On: 01/16/2015 18:48   Ct Head Wo Contrast  01/16/2015   CLINICAL DATA:  74 year old male with new onset of dizziness. History of fall to the ground. Diaphoresis and vomiting.  EXAM: CT HEAD WITHOUT CONTRAST  TECHNIQUE: Contiguous axial images were obtained from the base of the skull through the vertex without intravenous contrast.  COMPARISON:  No priors.  FINDINGS: Patchy areas of mild decreased attenuation are noted throughout the deep and periventricular white matter of the cerebral hemispheres bilaterally, compatible with mild chronic microvascular ischemic disease. No acute displaced skull fractures are identified. No acute intracranial abnormality. Specifically, no evidence of acute post-traumatic intracranial hemorrhage, no definite regions of acute/subacute cerebral ischemia, no focal mass, mass effect, hydrocephalus or abnormal intra or extra-axial fluid collections. The visualized paranasal sinuses and mastoids are well pneumatized.  IMPRESSION: 1. No acute intracranial abnormalities. 2. Very mild chronic microvascular ischemic changes in the cerebral white matter, as above.   Electronically Signed   By: Vinnie Langton M.D.   On: 01/16/2015 18:41   Mr Brain Wo Contrast  01/16/2015   CLINICAL DATA:  Acute onset of dizziness. Attempted to drive a vehicle with the experienced a second episode of dizziness. One episode of nausea.  EXAM: MRI HEAD WITHOUT CONTRAST  TECHNIQUE: Multiplanar, multiecho pulse sequences of the brain and surrounding structures were obtained without intravenous contrast.  COMPARISON:  CT head without contrast from the same day.  FINDINGS: A 12 mm focus of acute nonhemorrhagic infarction is present along the inferior aspect of the left cerebellum. A second punctate acute infarct is present more posteriorly in the left  cerebellum.  Mild scattered subcortical T2 changes are present bilaterally. No other acute infarct is present.  Ventricles are of normal size. No significant extra-axial fluid is present.  The internal auditory canals are within normal limits. The brainstem is unremarkable.  Abnormal signal in left vertebral artery suggests slow or occluded flow. There is also abnormal signal within the petrous and precavernous segment of the left internal carotid artery. The MCA branch vessels demonstrate normal flow.  Bilateral lens replacements are present. The globes and orbits are otherwise intact. The paranasal sinuses and mastoid air cells are clear. Midline structures are unremarkable.  IMPRESSION: 1. Acute nonhemorrhagic 12 mm infarct within the inferior left cerebellum. 2. Second punctate infarct also in the left cerebellum. 3. Abnormal signal in left vertebral artery suggesting slow or occluded flow. 4. Question abnormal signal in the precavernous left internal carotid artery, also suggesting slow flow. 5.  Mild subcortical white matter changes, slightly exaggerated for age. These results were called by telephone at the time of interpretation on 01/16/2015 at 9:41 pm to Dr. Antony Blackbird , who verbally acknowledged these results.   Electronically Signed   By: San Morelle M.D.   On: 01/16/2015 21:41   I have personally reviewed and evaluated these images and lab results as part of my medical decision-making.   EKG Interpretation None      MDM   Cody Willis is a 74 y.o. male with past medical history significant for hypertension, hypercholesterolemia, chronic pancreatitis who presents for presyncope, dizziness, and nausea with vomiting. Initial concern for possible stroke versus occult infection versus dehydration versus vasovagal episode. The episode took place at 3:30 PM, partially 2 hours prior to arrival. The patient had complete resolution of symptoms prior to arrival in the ED and as such, the  patient will not be made a code stroke. The patient will have a CT scan of the head performed emergently as well as diagnostic laboratory imaging and EKG studies. The patient was given Zofran and round and says his nausea has completely resolved.  On exam, the patient had a completely unremarkable neurological exam.  The patient's laboratory imaging testing results are seen above. The patient's urine did not suggest acute infection, his urine did appear concentrated possibly suggestive of dehydration. The patient's BMP did not show any R abnormality or kidney dysfunction. The patient's CBC did not suggest acute infection with a normal white blood cell count and hemoglobin. The patient's INR was normal and his glucose was 104. The patient's EKG did not show evidence of acute ischemia or arrhythmia.  The patient's CT scan revealed no acute intracranial abnormalities however, they didn't find some mild chronic microvascular ischemic changes in the cerebral white matter. The patient's chest x-ray did not show any acute abnormality.  Given the patient's significant symptoms concerning for TIA or stroke, an MRI was ordered to further evaluate for ischemic intracerebral changes. The patient's MRI revealed acute stroke in the cerebellum. The patient was given aspirin and the neurology team was quickly called for recommendations. The MRI also showed concern for slow or occluded flow of the left vertebral artery.  The neurology team came to see the patient and requested admission to the hospitalist service for further management of his acute stroke.   The patient did not have any other questions, concerns, or complaints and will be admitted to the hospitalist service.  This patient was seen with Dr. Reather Converse, emergency medicine attending.   Final diagnoses:  Cerebral infarction due to unspecified mechanism        Antony Blackbird, MD 01/17/15 [id number]  Elnora Morrison, MD 01/19/15 2009

## 2015-01-16 NOTE — ED Notes (Signed)
Pt cbg 104 

## 2015-01-16 NOTE — ED Notes (Addendum)
Patient transported to X-ray, pt will attempt to give urine sample when he gets back

## 2015-01-16 NOTE — ED Notes (Signed)
Per EMS: [place] when pt had sudden onset of dizziness and fell onto bottom. Denies any head injury of LOC. Pt initially was going to come to ED by POV when pt had additional episode of dizziness, pt was pale and diaphoretic, PERRLA. EMD noted en route pt had episode of emesis-100 cc-. Pt given 4mg  of zofran with relief. Denies any pain at this time, no neuro deficits noted by EMS. EKG done and unremarkable. VSS en route.

## 2015-01-17 ENCOUNTER — Inpatient Hospital Stay (HOSPITAL_COMMUNITY): Payer: PPO

## 2015-01-17 ENCOUNTER — Encounter (HOSPITAL_COMMUNITY): Payer: PPO

## 2015-01-17 ENCOUNTER — Ambulatory Visit: Payer: PPO | Admitting: Internal Medicine

## 2015-01-17 ENCOUNTER — Telehealth: Payer: Self-pay | Admitting: Internal Medicine

## 2015-01-17 ENCOUNTER — Encounter (HOSPITAL_COMMUNITY): Payer: Self-pay | Admitting: Radiology

## 2015-01-17 DIAGNOSIS — I63342 Cerebral infarction due to thrombosis of left cerebellar artery: Secondary | ICD-10-CM | POA: Diagnosis not present

## 2015-01-17 DIAGNOSIS — E785 Hyperlipidemia, unspecified: Secondary | ICD-10-CM | POA: Diagnosis present

## 2015-01-17 DIAGNOSIS — I63112 Cerebral infarction due to embolism of left vertebral artery: Secondary | ICD-10-CM | POA: Diagnosis not present

## 2015-01-17 DIAGNOSIS — E782 Mixed hyperlipidemia: Secondary | ICD-10-CM | POA: Insufficient documentation

## 2015-01-17 DIAGNOSIS — Z961 Presence of intraocular lens: Secondary | ICD-10-CM | POA: Diagnosis present

## 2015-01-17 DIAGNOSIS — Z79899 Other long term (current) drug therapy: Secondary | ICD-10-CM | POA: Diagnosis not present

## 2015-01-17 DIAGNOSIS — I7774 Dissection of vertebral artery: Secondary | ICD-10-CM

## 2015-01-17 DIAGNOSIS — I6789 Other cerebrovascular disease: Secondary | ICD-10-CM | POA: Diagnosis not present

## 2015-01-17 DIAGNOSIS — Z9842 Cataract extraction status, left eye: Secondary | ICD-10-CM | POA: Diagnosis not present

## 2015-01-17 DIAGNOSIS — I639 Cerebral infarction, unspecified: Secondary | ICD-10-CM | POA: Diagnosis present

## 2015-01-17 DIAGNOSIS — I63212 Cerebral infarction due to unspecified occlusion or stenosis of left vertebral arteries: Secondary | ICD-10-CM | POA: Diagnosis present

## 2015-01-17 DIAGNOSIS — R42 Dizziness and giddiness: Secondary | ICD-10-CM | POA: Diagnosis present

## 2015-01-17 DIAGNOSIS — K219 Gastro-esophageal reflux disease without esophagitis: Secondary | ICD-10-CM | POA: Diagnosis present

## 2015-01-17 DIAGNOSIS — F17210 Nicotine dependence, cigarettes, uncomplicated: Secondary | ICD-10-CM | POA: Diagnosis present

## 2015-01-17 DIAGNOSIS — F17200 Nicotine dependence, unspecified, uncomplicated: Secondary | ICD-10-CM | POA: Diagnosis not present

## 2015-01-17 DIAGNOSIS — K861 Other chronic pancreatitis: Secondary | ICD-10-CM | POA: Diagnosis present

## 2015-01-17 DIAGNOSIS — Z9841 Cataract extraction status, right eye: Secondary | ICD-10-CM | POA: Diagnosis not present

## 2015-01-17 DIAGNOSIS — Z85820 Personal history of malignant melanoma of skin: Secondary | ICD-10-CM | POA: Diagnosis not present

## 2015-01-17 DIAGNOSIS — I10 Essential (primary) hypertension: Secondary | ICD-10-CM

## 2015-01-17 LAB — LIPID PANEL
CHOLESTEROL: 116 mg/dL (ref 0–200)
HDL: 37 mg/dL — AB (ref >40)
LDL Cholesterol: 61 mg/dL (ref 0–99)
TRIGLYCERIDES: 88 mg/dL (ref <150)
Total CHOL/HDL Ratio: 3.1 RATIO
VLDL: 18 mg/dL (ref 0–40)

## 2015-01-17 MED ORDER — PANCRELIPASE (LIP-PROT-AMYL) 24000-76000 UNITS PO CPEP
3.0000 | ORAL_CAPSULE | Freq: Three times a day (TID) | ORAL | Status: DC
  Administered 2015-01-17 – 2015-01-18 (×4): 72000 [IU] via ORAL
  Filled 2015-01-17 (×8): qty 3

## 2015-01-17 MED ORDER — LISINOPRIL 10 MG PO TABS
10.0000 mg | ORAL_TABLET | Freq: Every day | ORAL | Status: DC
  Administered 2015-01-18: 10 mg via ORAL
  Filled 2015-01-17: qty 1

## 2015-01-17 MED ORDER — CLOPIDOGREL BISULFATE 75 MG PO TABS: 75.0000 mg | ORAL_TABLET | Freq: Every day | ORAL | Status: DC

## 2015-01-17 MED ORDER — ASPIRIN EC 81 MG PO TBEC
81.0000 mg | DELAYED_RELEASE_TABLET | Freq: Every day | ORAL | Status: DC
  Administered 2015-01-18: 81 mg via ORAL
  Filled 2015-01-17: qty 1

## 2015-01-17 MED ORDER — LISINOPRIL 10 MG PO TABS: 10.0000 mg | ORAL_TABLET | Freq: Every day | ORAL | Status: DC

## 2015-01-17 MED ORDER — PRAVASTATIN SODIUM 40 MG PO TABS
40.0000 mg | ORAL_TABLET | Freq: Every day | ORAL | Status: DC
  Administered 2015-01-17: 40 mg via ORAL
  Filled 2015-01-17: qty 1

## 2015-01-17 MED ORDER — CLOPIDOGREL BISULFATE 75 MG PO TABS
75.0000 mg | ORAL_TABLET | Freq: Every day | ORAL | Status: DC
Start: 2015-01-18 — End: 2015-01-18
  Administered 2015-01-18: 75 mg via ORAL
  Filled 2015-01-17: qty 1

## 2015-01-17 MED ORDER — IOHEXOL 350 MG/ML SOLN
50.0000 mL | Freq: Once | INTRAVENOUS | Status: AC | PRN
Start: 2015-01-17 — End: 2015-01-17
  Administered 2015-01-17: 50 mL via INTRAVENOUS

## 2015-01-17 MED ORDER — ASPIRIN 325 MG PO TABS
325.0000 mg | ORAL_TABLET | Freq: Every day | ORAL | Status: DC
  Administered 2015-01-17: 325 mg via ORAL
  Filled 2015-01-17: qty 1

## 2015-01-17 MED ORDER — LISINOPRIL 20 MG PO TABS
20.0000 mg | ORAL_TABLET | Freq: Every day | ORAL | Status: DC
  Filled 2015-01-17: qty 1

## 2015-01-17 MED ORDER — ACETAMINOPHEN 325 MG PO TABS: 650.0000 mg | ORAL_TABLET | ORAL | Status: DC | PRN

## 2015-01-17 MED ORDER — ACETAMINOPHEN 650 MG RE SUPP: 650.0000 mg | RECTAL | Status: DC | PRN

## 2015-01-17 MED ORDER — ASPIRIN 300 MG RE SUPP: 300.0000 mg | Freq: Every day | RECTAL | Status: DC

## 2015-01-17 MED ORDER — STROKE: EARLY STAGES OF RECOVERY BOOK
Freq: Once | Status: AC
  Administered 2015-01-17: 03:00:00
  Filled 2015-01-17: qty 1

## 2015-01-17 MED ORDER — FAMOTIDINE 20 MG PO TABS
20.0000 mg | ORAL_TABLET | Freq: Two times a day (BID) | ORAL | Status: DC
Start: 2015-01-17 — End: 2015-01-18
  Administered 2015-01-17 – 2015-01-18 (×4): 20 mg via ORAL
  Filled 2015-01-17 (×3): qty 1

## 2015-01-17 MED ORDER — ASPIRIN 81 MG PO TBEC: 81.0000 mg | DELAYED_RELEASE_TABLET | Freq: Every day | ORAL | Status: DC

## 2015-01-17 NOTE — Progress Notes (Signed)
  Echocardiogram 2D Echocardiogram has been performed.  Darlina Sicilian M 01/17/2015, 2:34 PM

## 2015-01-17 NOTE — Consult Note (Signed)
Admission H&P    Chief Complaint: Acute onset of vertigo and nausea.  HPI: Cody Willis is an 74 y.o. male history of hypertension and hypercholesterolemia presenting with acute vertigo and nausea. Onset was at 3:30 PM this afternoon. He has no previous history of similar symptoms. No focal deficits were noted, including no changes in speech and no visual changes. He also had no focal motor nor sensory abnormalities. CT scan of his head showed no acute intracranial abnormality. MRI showed a 12 mm left cerebellar acute ischemic infarction as well as another small punctate left cerebellar acute infarction. MRA showed normal signal in left vertebral artery suggesting slow flow or occlusion, as well as abnormal signal within precavernous left internal carotid artery suggesting slow flow. Deficits resolved. NIH stroke score at the time of this evaluation was 0.  LSN: 3:30 PM on 01/16/2015 tPA Given: No: Symptoms resolved mRankin:  Past Medical History  Diagnosis Date  . Hiatal hernia   . Esophageal reflux   . Chronic pancreatitis ([place])   . Other B-complex deficiencies   . Essential hypertension, benign   . Pure hypercholesterolemia     Past Surgical History  Procedure Laterality Date  . Appendectomy    . Cholecystectomy    . Back surgery      Diskectomy between L4 and L5  . Cataract extraction w/ intraocular lens  implant, bilateral  12/15 and 2/16  . Melanoma excision  ~2009    right upper abdomen    Family History  Problem Relation Age of Onset  . Crohn's disease Sister   . Colitis Brother   . Colon cancer Neg Hx    Social History:  reports that he has been smoking Cigarettes.  He has been smoking about 1.00 pack per day. He has never used smokeless tobacco. He reports that he does not drink alcohol or use illicit drugs.  Allergies:  Allergies  Allergen Reactions  . Sulfamethoxazole-Trimethoprim Rash    Medications: Patient's preadmission medications were reviewed by  me.  ROS: History obtained from the patient  General ROS: negative for - chills, fatigue, fever, night sweats, weight gain or weight loss Psychological ROS: negative for - behavioral disorder, hallucinations, memory difficulties, mood swings or suicidal ideation Ophthalmic ROS: negative for - blurry vision, double vision, eye pain or loss of vision ENT ROS: negative for - epistaxis, nasal discharge, oral lesions, sore throat, tinnitus or vertigo Allergy and Immunology ROS: negative for - hives or itchy/watery eyes Hematological and Lymphatic ROS: negative for - bleeding problems, bruising or swollen lymph nodes Endocrine ROS: negative for - galactorrhea, hair pattern changes, polydipsia/polyuria or temperature intolerance Respiratory ROS: negative for - cough, hemoptysis, shortness of breath or wheezing Cardiovascular ROS: negative for - chest pain, dyspnea on exertion, edema or irregular heartbeat Gastrointestinal ROS: negative for - abdominal pain, diarrhea, hematemesis, nausea/vomiting or stool incontinence Genito-Urinary ROS: negative for - dysuria, hematuria, incontinence or urinary frequency/urgency Musculoskeletal ROS: negative for - joint swelling or muscular weakness Neurological ROS: as noted in HPI Dermatological ROS: negative for rash and skin lesion changes  Physical Examination: Blood pressure 149/97, pulse 60, temperature 97.2 F (36.2 C), temperature source Oral, resp. rate 18, height $RemoveBe'5\' 7"'[id number]$  (1.702 m), weight 74.844 kg (165 lb), SpO2 96 %.  HEENT-  Normocephalic, no lesions, without obvious abnormality.  Normal external eye and conjunctiva.  Normal TM's bilaterally.  Normal auditory canals and external ears. Normal external nose, mucus membranes and septum.  Normal pharynx. Neck supple with no masses,  nodes, nodules or enlargement. Cardiovascular - regular rate and rhythm, S1, S2 normal, no murmur, click, rub or gallop Lungs - chest clear, no wheezing, rales, normal  symmetric air entry Abdomen - soft, non-tender; bowel sounds normal; no masses,  no organomegaly Extremities - no joint deformities, effusion, or inflammation, no edema and no skin discoloration  Neurologic Examination: Mental Status: Alert, oriented, thought content appropriate.  Speech fluent without evidence of aphasia. Able to follow commands without difficulty. Cranial Nerves: II-Visual fields were normal. III/IV/VI-Pupils were equal and reacted normally to light. Extraocular movements were full and conjugate.    V/VII-no facial numbness and no facial weakness. VIII-normal. X-normal speech. XI: trapezius strength/neck flexion strength normal bilaterally XII-midline tongue extension with normal strength. Motor: 5/5 bilaterally with normal tone and bulk Sensory: Normal throughout. Deep Tendon Reflexes: Based 1+ and symmetric. Plantars: Flexor bilaterally Cerebellar: Normal finger-to-nose testing. Carotid auscultation: Normal  Results for orders placed or performed during the hospital encounter of 01/16/15 (from the past 48 hour(s))  CBG monitoring, ED     Status: Abnormal   Collection Time: 01/16/15  5:33 PM  Result Value Ref Range   Glucose-Capillary 104 (H) 65 - 99 mg/dL  Basic metabolic panel     Status: Abnormal   Collection Time: 01/16/15  5:34 PM  Result Value Ref Range   Sodium 142 135 - 145 mmol/L   Potassium 4.1 3.5 - 5.1 mmol/L   Chloride 104 101 - 111 mmol/L   CO2 29 22 - 32 mmol/L   Glucose, Bld 116 (H) 65 - 99 mg/dL   BUN 20 6 - 20 mg/dL   Creatinine, Ser 1.17 0.61 - 1.24 mg/dL   Calcium 9.4 8.9 - 10.3 mg/dL   GFR calc non Af Amer >60 >60 mL/min   GFR calc Af Amer >60 >60 mL/min    Comment: (NOTE) The eGFR has been calculated using the CKD EPI equation. This calculation has not been validated in all clinical situations. eGFR's persistently <60 mL/min signify possible Chronic Kidney Disease.    Anion gap 9 5 - 15  CBC     Status: None   Collection Time:  01/16/15  5:34 PM  Result Value Ref Range   WBC 8.3 4.0 - 10.5 K/uL   RBC 5.03 4.22 - 5.81 MIL/uL   Hemoglobin 15.2 13.0 - 17.0 g/dL   HCT 44.6 39.0 - 52.0 %   MCV 88.7 78.0 - 100.0 fL   MCH 30.2 26.0 - 34.0 pg   MCHC 34.1 30.0 - 36.0 g/dL   RDW 14.7 11.5 - 15.5 %   Platelets 154 150 - 400 K/uL  Protime-INR     Status: None   Collection Time: 01/16/15  5:34 PM  Result Value Ref Range   Prothrombin Time 13.7 11.6 - 15.2 seconds   INR 1.03 0.00 - 1.49  Urinalysis, Routine w reflex microscopic (not at [facility])     Status: Abnormal   Collection Time: 01/16/15  6:55 PM  Result Value Ref Range   Color, Urine YELLOW YELLOW   APPearance CLEAR CLEAR   Specific Gravity, Urine >1.030 (H) 1.005 - 1.030   pH 5.5 5.0 - 8.0   Glucose, UA NEGATIVE NEGATIVE mg/dL   Hgb urine dipstick TRACE (A) NEGATIVE   Bilirubin Urine NEGATIVE NEGATIVE   Ketones, ur NEGATIVE NEGATIVE mg/dL   Protein, ur NEGATIVE NEGATIVE mg/dL   Urobilinogen, UA 0.2 0.0 - 1.0 mg/dL   Nitrite NEGATIVE NEGATIVE   Leukocytes, UA NEGATIVE NEGATIVE  Urine  microscopic-add on     Status: Abnormal   Collection Time: 01/16/15  6:55 PM  Result Value Ref Range   WBC, UA 0-2 <3 WBC/hpf   RBC / HPF 0-2 <3 RBC/hpf   Casts HYALINE CASTS (A) NEGATIVE   Dg Chest 2 View  01/16/2015   CLINICAL DATA:  Sun onset of dizziness.  EXAM: CHEST  2 VIEW  COMPARISON:  05/17/2014  FINDINGS: Cardiomediastinal silhouette is normal. Mediastinal contours appear intact.  There is no evidence of focal airspace consolidation, pleural effusion or pneumothorax.  Osseous structures are without acute abnormality. Soft tissues are grossly normal.  IMPRESSION: No radiographic evidence of acute cardiopulmonary abnormality.   Electronically Signed   By: Fidela Salisbury M.D.   On: 01/16/2015 18:48   Ct Head Wo Contrast  01/16/2015   CLINICAL DATA:  74 year old male with new onset of dizziness. History of fall to the ground. Diaphoresis and vomiting.  EXAM: CT HEAD  WITHOUT CONTRAST  TECHNIQUE: Contiguous axial images were obtained from the base of the skull through the vertex without intravenous contrast.  COMPARISON:  No priors.  FINDINGS: Patchy areas of mild decreased attenuation are noted throughout the deep and periventricular white matter of the cerebral hemispheres bilaterally, compatible with mild chronic microvascular ischemic disease. No acute displaced skull fractures are identified. No acute intracranial abnormality. Specifically, no evidence of acute post-traumatic intracranial hemorrhage, no definite regions of acute/subacute cerebral ischemia, no focal mass, mass effect, hydrocephalus or abnormal intra or extra-axial fluid collections. The visualized paranasal sinuses and mastoids are well pneumatized.  IMPRESSION: 1. No acute intracranial abnormalities. 2. Very mild chronic microvascular ischemic changes in the cerebral white matter, as above.   Electronically Signed   By: Vinnie Langton M.D.   On: 01/16/2015 18:41   Mr Brain Wo Contrast  01/16/2015   CLINICAL DATA:  Acute onset of dizziness. Attempted to drive a vehicle with the experienced a second episode of dizziness. One episode of nausea.  EXAM: MRI HEAD WITHOUT CONTRAST  TECHNIQUE: Multiplanar, multiecho pulse sequences of the brain and surrounding structures were obtained without intravenous contrast.  COMPARISON:  CT head without contrast from the same day.  FINDINGS: A 12 mm focus of acute nonhemorrhagic infarction is present along the inferior aspect of the left cerebellum. A second punctate acute infarct is present more posteriorly in the left cerebellum.  Mild scattered subcortical T2 changes are present bilaterally. No other acute infarct is present.  Ventricles are of normal size. No significant extra-axial fluid is present.  The internal auditory canals are within normal limits. The brainstem is unremarkable.  Abnormal signal in left vertebral artery suggests slow or occluded flow. There is  also abnormal signal within the petrous and precavernous segment of the left internal carotid artery. The MCA branch vessels demonstrate normal flow.  Bilateral lens replacements are present. The globes and orbits are otherwise intact. The paranasal sinuses and mastoid air cells are clear. Midline structures are unremarkable.  IMPRESSION: 1. Acute nonhemorrhagic 12 mm infarct within the inferior left cerebellum. 2. Second punctate infarct also in the left cerebellum. 3. Abnormal signal in left vertebral artery suggesting slow or occluded flow. 4. Question abnormal signal in the precavernous left internal carotid artery, also suggesting slow flow. 5. Mild subcortical white matter changes, slightly exaggerated for age. These results were called by telephone at the time of interpretation on 01/16/2015 at 9:41 pm to Dr. Antony Blackbird , who verbally acknowledged these results.   Electronically Signed   By:  San Morelle M.D.   On: 01/16/2015 21:41    Assessment: 74 y.o. male with multiple risk factors for stroke presenting with acute left cerebellar strokes as described above. Stroke Risk Factors - hyperlipidemia and hypertension  Plan: 1. HgbA1c, fasting lipid panel 2. PT consult 3. Echocardiogram 4. Carotid dopplers 5. Prophylactic therapy-Antiplatelet med: Aspirin  6. Risk factor modification 7. Telemetry monitoring  C.R. Nicole Kindred, MD [facility] [phone]  01/17/2015, 12:28 AM

## 2015-01-17 NOTE — Progress Notes (Signed)
Utilization review completed. Chasin Findling, RN, BSN. 

## 2015-01-17 NOTE — ED Notes (Signed)
Admitting doctor at the bedside 

## 2015-01-17 NOTE — ED Notes (Signed)
Report called to rn  For 5 w

## 2015-01-17 NOTE — Care Management Note (Signed)
Case Management Note  Patient Details  Name: Cody Willis MRN: [id number] Date of Birth: 1940/04/30  Subjective/Objective:                 Patient admitted from home with wife. +CVA. Awaiting PT recommendations.   Action/Plan:  Will continue to follow and offer [facility] services and resources as needed.   Expected Discharge Date:                  Expected Discharge Plan:  [place]  In-House Referral:     Discharge planning Services  CM Consult  Post Acute Care Choice:    Choice offered to:     DME Arranged:    DME Agency:     HH Arranged:    [place] Agency:     Status of Service:  In process, will continue to follow  Medicare Important Message Given:    Date Medicare IM Given:    Medicare IM give by:    Date Additional Medicare IM Given:    Additional Medicare Important Message give by:     If discussed at [place] of Stay Meetings, dates discussed:    Additional Comments:  Carles Collet, RN 01/17/2015, 2:58 PM

## 2015-01-17 NOTE — Progress Notes (Signed)
PROGRESS NOTE  MONTREAL STEIDLE IZT:[id number] DOB: 07/07/1940 DOA: 01/16/2015 PCP: Morton Peters, MD  HPI/Recap of past 24 hours:  Feeling better, denies dizziness, no n/v. No headache, multiple family member in room.   Assessment/Plan: Active Problems:   Stroke [facility])   CVA (cerebral infarction)  Left cerebellum nonhemorrhagic CVA (36mm in the inferior left cerebellum, second punctate infarct in the left cerebellum): Echo /ldl/blood sugar wnl,  Symptom resolved, appreciated neurology input, dual antiplatelet with asa/plavix for three months then plavix, avoid hypotension, stop smoking per neurology.   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  Smoking: cessation education provided, agree with nicotine patch.  Code Status: full  Family Communication: patient and family  Disposition Plan: home 10/11   Consultants:  neurology  Procedures:  none  Antibiotics:  none   Objective: BP 114/45 mmHg  Pulse 62  Temp(Src) 97.7 F (36.5 C) (Oral)  Resp 22  Ht 5\' 7"  (1.702 m)  Wt 171 lb 1.2 oz (77.6 kg)  BMI 26.79 kg/m2  SpO2 96%  Intake/Output Summary (Last 24 hours) at 01/17/15 1822 Last data filed at 01/17/15 1720  Gross per 24 hour  Intake    760 ml  Output      0 ml  Net    760 ml   Filed Weights   01/16/15 1721 01/17/15 0305  Weight: 165 lb (74.844 kg) 171 lb 1.2 oz (77.6 kg)    Exam:   General:  NAD  Cardiovascular: RRR  Respiratory: CTABL  Abdomen: Soft/ND/NT, positive BS  Musculoskeletal: No Edema  Neuro: aaox3  Data Reviewed: Basic Metabolic Panel:  Recent Labs Lab 01/16/15 1734  NA 142  K 4.1  CL 104  CO2 29  GLUCOSE 116*  BUN 20  CREATININE 1.17  CALCIUM 9.4   Liver Function Tests: No results for input(s): AST, ALT, ALKPHOS, BILITOT, PROT, ALBUMIN in the  last 168 hours. No results for input(s): LIPASE, AMYLASE in the last 168 hours. No results for input(s): AMMONIA in the last 168 hours. CBC:  Recent Labs Lab 01/16/15 1734  WBC 8.3  HGB 15.2  HCT 44.6  MCV 88.7  PLT 154   Cardiac Enzymes:   No results for input(s): CKTOTAL, CKMB, CKMBINDEX, TROPONINI in the last 168 hours. BNP (last 3 results) No results for input(s): BNP in the last 8760 hours.  ProBNP (last 3 results) No results for input(s): PROBNP in the last 8760 hours.  CBG:  Recent Labs Lab 01/16/15 1733  GLUCAP 104*    No results found for this or any previous visit (from the past 240 hour(s)).   Studies: Ct Angio Head W/cm &/or Wo Cm  01/17/2015   CLINICAL DATA:  Followup stroke.  Hypertension history.  EXAM: CT ANGIOGRAPHY HEAD AND NECK  TECHNIQUE: Multidetector CT imaging of the head and neck was performed using the standard protocol during bolus administration of intravenous contrast. Multiplanar CT image reconstructions and MIPs were obtained to evaluate the vascular anatomy. Carotid stenosis measurements (when applicable) are obtained utilizing NASCET criteria, using the distal internal carotid diameter as the denominator.  CONTRAST:  78mL OMNIPAQUE IOHEXOL 350 MG/ML SOLN  COMPARISON:  Brain MRI from yesterday  FINDINGS: CTA NECK  Aortic arch: 3 vessel branching. Mild atheromatous wall thickening. No indication of dissection.  Right carotid system: Atherosclerosis, noncalcified,  at the bifurcation without flow limiting stenosis. No evidence of acute dissection. No indication of fibromuscular dysplasia of the ICA.  Left carotid system: Abnormally narrow but smooth appearance of the common and internal carotid arteries, especially in the setting of large left posterior communicating artery. Occlusion or pre occlusion at the petrous cavernous junction where there are wispy vessels suggesting well established collaterals. No surrounding soft tissue inflammation or visible  mural hematoma. Findings likely sequela of remote dissection.  Vertebral arteries:Proximal subclavian arteries are patent. The non dominant left vertebral artery has poor flow from the origin with intermittent partial reconstitution, consistent with recent dissection. Dissection continues to the vertebrobasilar junction.  Skeleton: Multilevel degenerative disc and facet disease. No acute finding.  Other neck: No incidentally detected mass or adenopathy.  Bronchial wall thickening and centrilobular emphysema.  CTA HEAD  Anterior circulation: As noted above, there is diminutive left ICA, especially at the cavernous petrous junction. Left A1 is hypoplastic but there is a large left posterior communicating artery. No major branch occlusion or acute intracranial dissection suspected. Negative for aneurysm.  Posterior circulation: Left vertebral artery dissection continues to the vertebrobasilar junction. The left PICA is opacified. Dominant right vertebral artery is widely patent, as is the basilar. Dominant right AICA. Patent superior cerebellar and posterior cerebral arteries. As above, large left posterior communicating artery. Negative for aneurysm.  Venous sinuses: Patent  Anatomic variants: Hypoplastic left A1 and large left posterior communicating artery.  Delayed phase: No abnormal parenchymal enhancement.  These results were called by telephone at the time of interpretation on 01/17/2015 at 10:25 am to Dr. Rosalin Hawking , who verbally acknowledged these results.  IMPRESSION: 1. 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 No evidence of hemorrhagic conversion or progressive left cerebellar infarction since brain MRI yesterday; the left PICA is open proximally. 2. Changes of remote left ICA dissection with effective occlusion at the petrous cavernous junction. Left A1 segment is hypoplastic but there is large and patent left posterior communicating artery. 3.  Atherosclerosis without flow limiting stenosis. 4. Emphysema.   Electronically Signed   By: Monte Fantasia M.D.   On: 01/17/2015 10:26   Dg Chest 2 View  01/16/2015   CLINICAL DATA:  Nancy Fetter onset of dizziness.  EXAM: CHEST  2 VIEW  COMPARISON:  05/17/2014  FINDINGS: Cardiomediastinal silhouette is normal. Mediastinal contours appear intact.  There is no evidence of focal airspace consolidation, pleural effusion or pneumothorax.  Osseous structures are without acute abnormality. Soft tissues are grossly normal.  IMPRESSION: No radiographic evidence of acute cardiopulmonary abnormality.   Electronically Signed   By: Fidela Salisbury M.D.   On: 01/16/2015 18:48   Ct Head Wo Contrast  01/16/2015   CLINICAL DATA:  74 year old male with new onset of dizziness. History of fall to the ground. Diaphoresis and vomiting.  EXAM: CT HEAD WITHOUT CONTRAST  TECHNIQUE: Contiguous axial images were obtained from the base of the skull through the vertex without intravenous contrast.  COMPARISON:  No priors.  FINDINGS: Patchy areas of mild decreased attenuation are noted throughout the deep and periventricular white matter of the cerebral hemispheres bilaterally, compatible with mild chronic microvascular ischemic disease. No acute displaced skull fractures are identified. No acute intracranial abnormality. Specifically, no evidence of acute post-traumatic intracranial hemorrhage, no definite regions of acute/subacute cerebral ischemia, no focal mass, mass effect, hydrocephalus or abnormal intra or extra-axial fluid collections. The visualized paranasal sinuses and mastoids are well pneumatized.  IMPRESSION: 1. No  acute intracranial abnormalities. 2. Very mild chronic microvascular ischemic changes in the cerebral white matter, as above.   Electronically Signed   By: Vinnie Langton M.D.   On: 01/16/2015 18:41   Ct Angio Neck W/cm &/or Wo/cm  01/17/2015   CLINICAL DATA:  Followup stroke.  Hypertension history.  EXAM: CT  ANGIOGRAPHY HEAD AND NECK  TECHNIQUE: Multidetector CT imaging of the head and neck was performed using the standard protocol during bolus administration of intravenous contrast. Multiplanar CT image reconstructions and MIPs were obtained to evaluate the vascular anatomy. Carotid stenosis measurements (when applicable) are obtained utilizing NASCET criteria, using the distal internal carotid diameter as the denominator.  CONTRAST:  24mL OMNIPAQUE IOHEXOL 350 MG/ML SOLN  COMPARISON:  Brain MRI from yesterday  FINDINGS: CTA NECK  Aortic arch: 3 vessel branching. Mild atheromatous wall thickening. No indication of dissection.  Right carotid system: Atherosclerosis, noncalcified, at the bifurcation without flow limiting stenosis. No evidence of acute dissection. No indication of fibromuscular dysplasia of the ICA.  Left carotid system: Abnormally narrow but smooth appearance of the common and internal carotid arteries, especially in the setting of large left posterior communicating artery. Occlusion or pre occlusion at the petrous cavernous junction where there are wispy vessels suggesting well established collaterals. No surrounding soft tissue inflammation or visible mural hematoma. Findings likely sequela of remote dissection.  Vertebral arteries:Proximal subclavian arteries are patent. The non dominant left vertebral artery has poor flow from the origin with intermittent partial reconstitution, consistent with recent dissection. Dissection continues to the vertebrobasilar junction.  Skeleton: Multilevel degenerative disc and facet disease. No acute finding.  Other neck: No incidentally detected mass or adenopathy.  Bronchial wall thickening and centrilobular emphysema.  CTA HEAD  Anterior circulation: As noted above, there is diminutive left ICA, especially at the cavernous petrous junction. Left A1 is hypoplastic but there is a large left posterior communicating artery. No major branch occlusion or acute  intracranial dissection suspected. Negative for aneurysm.  Posterior circulation: Left vertebral artery dissection continues to the vertebrobasilar junction. The left PICA is opacified. Dominant right vertebral artery is widely patent, as is the basilar. Dominant right AICA. Patent superior cerebellar and posterior cerebral arteries. As above, large left posterior communicating artery. Negative for aneurysm.  Venous sinuses: Patent  Anatomic variants: Hypoplastic left A1 and large left posterior communicating artery.  Delayed phase: No abnormal parenchymal enhancement.  These results were called by telephone at the time of interpretation on 01/17/2015 at 10:25 am to Dr. Rosalin Hawking , who verbally acknowledged these results.  IMPRESSION: 1. 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 No evidence of hemorrhagic conversion or progressive left cerebellar infarction since brain MRI yesterday; the left PICA is open proximally. 2. Changes of remote left ICA dissection with effective occlusion at the petrous cavernous junction. Left A1 segment is hypoplastic but there is large and patent left posterior communicating artery. 3. Atherosclerosis without flow limiting stenosis. 4. Emphysema.   Electronically Signed   By: Monte Fantasia M.D.   On: 01/17/2015 10:26   Mr Brain Wo Contrast  01/16/2015   CLINICAL DATA:  Acute onset of dizziness. Attempted to drive a vehicle with the experienced a second episode of dizziness. One episode of nausea.  EXAM: MRI HEAD WITHOUT CONTRAST  TECHNIQUE: Multiplanar, multiecho pulse sequences of the brain and surrounding structures were obtained without intravenous contrast.  COMPARISON:  CT head without contrast from the same day.  FINDINGS: A 12 mm  focus of acute nonhemorrhagic infarction is present along the inferior aspect of the left cerebellum. A second punctate acute infarct is present more posteriorly in the left cerebellum.  Mild scattered  subcortical T2 changes are present bilaterally. No other acute infarct is present.  Ventricles are of normal size. No significant extra-axial fluid is present.  The internal auditory canals are within normal limits. The brainstem is unremarkable.  Abnormal signal in left vertebral artery suggests slow or occluded flow. There is also abnormal signal within the petrous and precavernous segment of the left internal carotid artery. The MCA branch vessels demonstrate normal flow.  Bilateral lens replacements are present. The globes and orbits are otherwise intact. The paranasal sinuses and mastoid air cells are clear. Midline structures are unremarkable.  IMPRESSION: 1. Acute nonhemorrhagic 12 mm infarct within the inferior left cerebellum. 2. Second punctate infarct also in the left cerebellum. 3. Abnormal signal in left vertebral artery suggesting slow or occluded flow. 4. Question abnormal signal in the precavernous left internal carotid artery, also suggesting slow flow. 5. Mild subcortical white matter changes, slightly exaggerated for age. These results were called by telephone at the time of interpretation on 01/16/2015 at 9:41 pm to Dr. Antony Blackbird , who verbally acknowledged these results.   Electronically Signed   By: San Morelle M.D.   On: 01/16/2015 21:41    Scheduled Meds: . [START ON 01/18/2015] aspirin EC  81 mg Oral Daily  . [START ON 01/18/2015] clopidogrel  75 mg Oral Daily  . famotidine  20 mg Oral BID  . [START ON 01/18/2015] lisinopril  10 mg Oral Daily  . Pancrelipase (Lip-Prot-Amyl)  3 capsule Oral TID WC  . pravastatin  40 mg Oral q1800    Continuous Infusions:    Time spent: 9mins  Rosaelena Kemnitz MD, PhD  [facility] Pager [phone]. If 7PM-7AM, please contact night-coverage at [email or web address], password [email or web address] 01/17/2015, 6:22 PM  LOS: 0 days

## 2015-01-17 NOTE — ED Notes (Signed)
Pt was given pepcid.  The pt is  Comfortable

## 2015-01-17 NOTE — Telephone Encounter (Signed)
No charge. 

## 2015-01-17 NOTE — Progress Notes (Signed)
STROKE TEAM PROGRESS NOTE   HISTORY Cody Willis is an 74 y.o. male history of hypertension and hypercholesterolemia presenting with acute vertigo and nausea. Onset was at 3:30 PM this afternoon 01/16/2015 ([facility]). He has no previous history of similar symptoms. No focal deficits were noted, including no changes in speech and no visual changes. He also had no focal motor nor sensory abnormalities. CT scan of his head showed no acute intracranial abnormality. MRI showed a 12 mm left cerebellar acute ischemic infarction as well as another small punctate left cerebellar acute infarction. MRA showed normal signal in left vertebral artery suggesting slow flow or occlusion, as well as abnormal signal within precavernous left internal carotid artery suggesting slow flow. Deficits resolved. NIH stroke score at the time of this evaluation was 0. Patient was not administered TPA secondary to symptoms resolved. He was admitted for further evaluation and treatment.   SUBJECTIVE (INTERVAL HISTORY) His wife and son are at the bedside.  Overall he feels his condition is rapidly improving. He still has some dizziness with motion but much better. He is willing to quit smoking. On lisinopril at home, Bp tends to be low but no checking BP at home. Admit that stand up quickly will make him feeling dizzy.   OBJECTIVE Temp:  [97.2 F (36.2 C)-97.7 F (36.5 C)] 97.7 F (36.5 C) (10/10 0647) Pulse Rate:  [57-84] 67 (10/10 0647) Cardiac Rhythm:  [-] Normal sinus rhythm (10/10 0700) Resp:  [12-43] 18 (10/10 0647) BP: (100-156)/(47-97) 107/84 mmHg (10/10 0647) SpO2:  [91 %-99 %] 96 % (10/10 0647) Weight:  [74.844 kg (165 lb)-77.6 kg (171 lb 1.2 oz)] 77.6 kg (171 lb 1.2 oz) (10/10 0305)  CBC:  Recent Labs Lab 01/16/15 1734  WBC 8.3  HGB 15.2  HCT 44.6  MCV 88.7  PLT 967    Basic Metabolic Panel:  Recent Labs Lab 01/16/15 1734  NA 142  K 4.1  CL 104  CO2 29  GLUCOSE 116*  BUN 20  CREATININE 1.17   CALCIUM 9.4    Lipid Panel:    Component Value Date/Time   CHOL 116 01/17/2015 0451   TRIG 88 01/17/2015 0451   HDL 37* 01/17/2015 0451   CHOLHDL 3.1 01/17/2015 0451   VLDL 18 01/17/2015 0451   LDLCALC 61 01/17/2015 0451   HgbA1c: No results found for: HGBA1C Urine Drug Screen: No results found for: LABOPIA, COCAINSCRNUR, LABBENZ, AMPHETMU, THCU, LABBARB  IMAGING I have personally reviewed the radiological images below and agree with the radiology interpretations. Blue text is my interpretation.  Dg Chest 2 View 01/16/2015   No radiographic evidence of acute cardiopulmonary abnormality.     Ct Head Wo Contrast 01/16/2015    1. No acute intracranial abnormalities. 2. Very mild chronic microvascular ischemic changes in the cerebral white matter  Mr Brain Wo Contrast 01/16/2015   1. Acute nonhemorrhagic 12 mm infarct within the inferior left cerebellum. 2. Second punctate infarct also in the left cerebellum. 3. Abnormal signal in left vertebral artery suggesting slow or occluded flow. 4. Question abnormal signal in the precavernous left internal carotid artery, also suggesting slow flow. 5. Mild subcortical white matter changes, slightly exaggerated for age.   CTA head and neck -  01/17/2015   IMPRESSION: 1. 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 No evidence of hemorrhagic conversion or progressive left cerebellar infarction since brain MRI yesterday; the left PICA is open proximally. 2. Changes of remote left ICA dissection  with effective occlusion at the petrous cavernous junction. Left A1 segment is hypoplastic but there is large and patent left posterior communicating artery. 3. Atherosclerosis without flow limiting stenosis. 4. Emphysema. It seems his left ICA and VA are in smaller caliber than right ICA and VA, there is left VA high grade stenosis vs. Occlusion, likely dissection vs. Athro, but left ICA no stenosis but congenitally smaller  caliber.  2D echo - - Left ventricle: The cavity size was normal. Wall thickness was normal. Systolic function was normal. The estimated ejection fraction was in the range of 60% to 65%. Wall motion was normal; there were no regional wall motion abnormalities. Left ventricular diastolic function parameters were normal. - Pulmonary arteries: PA peak pressure: 33 mm Hg (S).   PHYSICAL EXAM  Temp:  [97.2 F (36.2 C)-98.6 F (37 C)] 97.7 F (36.5 C) (10/10 1407) Pulse Rate:  [57-67] 62 (10/10 1407) Resp:  [12-43] 22 (10/10 1407) BP: (100-156)/(45-97) 114/45 mmHg (10/10 1407) SpO2:  [91 %-99 %] 96 % (10/10 1407) Weight:  [171 lb 1.2 oz (77.6 kg)] 171 lb 1.2 oz (77.6 kg) (10/10 0305)  General - Well nourished, well developed, in no apparent distress.  Ophthalmologic - Fundi not visualized due to light sensitivity.  Cardiovascular - Regular rate and rhythm with no murmur.  Mental Status -  Level of arousal and orientation to time, place, and person were intact. Language including expression, naming, repetition, comprehension was assessed and found intact. Fund of Knowledge was assessed and was intact.  Cranial Nerves II - XII - II - Visual field intact OU. III, IV, VI - Extraocular movements intact. V - Facial sensation intact bilaterally. VII - Facial movement intact bilaterally. VIII - Hearing & vestibular intact bilaterally. X - Palate elevates symmetrically. XI - Chin turning & shoulder shrug intact bilaterally. XII - Tongue protrusion intact.  Motor Strength - The patient's strength was normal in all extremities and pronator drift was absent.  Bulk was normal and fasciculations were absent.   Motor Tone - Muscle tone was assessed at the neck and appendages and was normal.  Reflexes - The patient's reflexes were 1+ in all extremities and he had no pathological reflexes.  Sensory - Light touch, temperature/pinprick were assessed and were symmetrical.     Coordination - The patient had normal movements in the hands and feet with no ataxia or dysmetria.  Tremor was absent.  Gait and Station - deferred due to safety concerns.   ASSESSMENT/PLAN [name] is a 74 y.o. male with history of hypertension and hypercholesterolemia  presenting with acute vertigo and nausea. He did not receive IV t-PA due to symptoms resovled.   Stroke:  2 left cerebellar small and punctate infarcts, embolic likely due to left VA dissection versus atherosclerosis  Resultant  mild lightheadedness with motion  MRI  2 L cerebellar infarcts (52mm and punctate)  CTA head and neck left VA high grade stenosis versus occlusion, likely dissection versus atherosclerosis  2D Echo  unremarkable  LDL 61  HgbA1c pending  SCDs for VTE prophylaxis  Diet Heart Room service appropriate?: Yes; Fluid consistency:: Thin  no antithrombotic prior to admission, now on aspirin 325 mg orally every day. Recommend dual antiplatelet with aspirin and Plavix for 3 months and then Plavix alone.  Patient counseled to be compliant with his antithrombotic medications  Ongoing aggressive stroke risk factor management  Therapy recommendations:  Pending  Disposition:  Pending  Hypertension  BP on the low side  Permissive hypertension (OK if < 220/120) but gradually normalize in 5-7 days Home meds lisinopril 20 Decreased lisinopril to 10 Patient was advised to check blood pressure at home twice a day for 2 weeks and record and bring over to PCP for BP meds adjustment Long-term BP goal 120-140  Hyperlipidemia  Home meds:  Mevacor, resumed in hospital  LDL 61, goal < 70  Continue statin at discharge  Tobacco abuse  Current smoker  Smoking cessation counseling provided  Pt is willing to quit  Other Stroke Risk Factors  Advanced age  Patient is advised to avoid vigorous head turns  Other Active Problems  GERD  Hospital day # 0  Neurology will sign  off. Please call with questions. Pt will follow up with Dr. Erlinda Hong at [facility] in about 2 months. Thanks for the consult.  Rosalin Hawking, MD PhD Stroke Neurology 01/17/2015 6:22 PM   To contact Stroke Continuity provider, please refer to [email or web address] After hours, contact General Neurology

## 2015-01-17 NOTE — ED Notes (Signed)
Dr Nicole Kindred neurologist in to see

## 2015-01-17 NOTE — ED Notes (Signed)
Family has gone and the pot is agitated because he has reflux and he takes pepcid for it   He is asking for that.  No orders from admitting doctor yet

## 2015-01-17 NOTE — H&P (Signed)
[facility] History and Physical  Cody Willis ZWC:[id number] DOB: July 25, 1940 DOA: 01/16/2015  PCP: Morton Peters, MD  Specialists: Neurology  Chief Complaint: Vertigo  HPI: Cody Willis is a 74 y.o. gentleman who still works full time as a Administrator with a history of HTN, dyslipidemia, and GERD.  He was in his baseline state of health until the day of presentation.  He developed the acute onset of vertigo and near syncope [place]  He fell down but he denies loss of consciousness.  He has had intermittent headache with a single episode of nausea and vomiting.  No chest pain or shortness of breath.  No lower urinary tract symptoms.  No overt blood loss.  ED evaluation concerning for acute left cerebellar CVA.  Neurology has seen the patient; hospitalist asked to admit.  Review of Systems: 12 systems reviewed and negative except as stated in HPI.  Past Medical History  Diagnosis Date  . Hiatal hernia   . Esophageal reflux   . Chronic pancreatitis ([place])   . Other B-complex deficiencies   . Essential hypertension, benign   . Pure hypercholesterolemia    Past Surgical History  Procedure Laterality Date  . Appendectomy    . Cholecystectomy    . Back surgery      Diskectomy between L4 and L5  . Cataract extraction w/ intraocular lens  implant, bilateral  12/15 and 2/16  . Melanoma excision  ~2009    right upper abdomen   Social History:  Social History   Social History Narrative   No living will   Wife should make decisions for him if needed  Active tobacco use.  No EtOH or illicit drug use  Allergies  Allergen Reactions  . Sulfamethoxazole-Trimethoprim Rash    Family History  Problem Relation Age of Onset  . Crohn's disease Sister   . Colitis Brother   . Colon cancer Neg Hx     Prior to Admission medications   Medication Sig Start Date End Date Taking? Authorizing Provider  cyanocobalamin (,VITAMIN B-12,) 1000 MCG/ML injection  Inject 1 mL (1,000 mcg total) into the muscle every 30 (thirty) days. 08/26/13  Yes Jerene Bears, MD  famotidine (PEPCID) 20 MG tablet Take 20 mg by mouth 2 (two) times daily as needed for heartburn or indigestion.   Yes Historical Provider, MD  lisinopril (PRINIVIL,ZESTRIL) 20 MG tablet Take 1 tablet (20 mg total) by mouth daily. 08/17/14  Yes Venia Carbon, MD  loperamide (IMODIUM A-D) 2 MG tablet Take 2 mg by mouth as needed for diarrhea or loose stools.    Yes Historical Provider, MD  lovastatin (MEVACOR) 40 MG tablet Take 1 tablet (40 mg total) by mouth at bedtime. 08/17/14  Yes Venia Carbon, MD  Pancrelipase, Lip-Prot-Amyl, ([name]) 25000 UNITS CPEP Take 3 capsules by mouth 3 (three) times daily. 11/17/14  Yes Jerene Bears, MD   Physical Exam: Filed Vitals:   01/17/15 0045 01/17/15 0115 01/17/15 0145 01/17/15 0200  BP: 131/96 113/51 124/60 134/64  Pulse: 60 66 59 67  Temp:      TempSrc:      Resp: 17 16 14  43  Height:      Weight:      SpO2: 93% 91% 96% 95%     General:  Awake and alert.  Oriented to person, place, time and situation.  NAD.  Eyes: PERRL bilaterally, conjunctiva are pink.  EOMI.  ENT: Moist mucous membranes.  No nasal drainage.  Neck: Supple.  No carotid bruit.   Cardiovascular: NR/RR.  No LE edema.  Respiratory: CTA bilaterally.  Abdomen: Soft/NT/ND.  Bowel sounds are present.  No guarding.  Skin: Warm and dry.  Musculoskeletal: Moves all four extremities spontaneously.  Psychiatric: Normal affect.  Neurologic: CN grossly intact.  Strength symmetric bilaterally.  Coordination intact.  Labs on Admission:  Basic Metabolic Panel:  Recent Labs Lab 01/16/15 1734  NA 142  K 4.1  CL 104  CO2 29  GLUCOSE 116*  BUN 20  CREATININE 1.17  CALCIUM 9.4   CBC:  Recent Labs Lab 01/16/15 1734  WBC 8.3  HGB 15.2  HCT 44.6  MCV 88.7  PLT 154   CBG:  Recent Labs Lab 01/16/15 1733  GLUCAP 104*    Radiological Exams on Admission: Dg  Chest 2 View  01/16/2015   CLINICAL DATA:  Sun onset of dizziness.  EXAM: CHEST  2 VIEW  COMPARISON:  05/17/2014  FINDINGS: Cardiomediastinal silhouette is normal. Mediastinal contours appear intact.  There is no evidence of focal airspace consolidation, pleural effusion or pneumothorax.  Osseous structures are without acute abnormality. Soft tissues are grossly normal.  IMPRESSION: No radiographic evidence of acute cardiopulmonary abnormality.   Electronically Signed   By: Fidela Salisbury M.D.   On: 01/16/2015 18:48   Ct Head Wo Contrast  01/16/2015   CLINICAL DATA:  74 year old male with new onset of dizziness. History of fall to the ground. Diaphoresis and vomiting.  EXAM: CT HEAD WITHOUT CONTRAST  TECHNIQUE: Contiguous axial images were obtained from the base of the skull through the vertex without intravenous contrast.  COMPARISON:  No priors.  FINDINGS: Patchy areas of mild decreased attenuation are noted throughout the deep and periventricular white matter of the cerebral hemispheres bilaterally, compatible with mild chronic microvascular ischemic disease. No acute displaced skull fractures are identified. No acute intracranial abnormality. Specifically, no evidence of acute post-traumatic intracranial hemorrhage, no definite regions of acute/subacute cerebral ischemia, no focal mass, mass effect, hydrocephalus or abnormal intra or extra-axial fluid collections. The visualized paranasal sinuses and mastoids are well pneumatized.  IMPRESSION: 1. No acute intracranial abnormalities. 2. Very mild chronic microvascular ischemic changes in the cerebral white matter, as above.   Electronically Signed   By: Vinnie Langton M.D.   On: 01/16/2015 18:41   Mr Brain Wo Contrast  01/16/2015   CLINICAL DATA:  Acute onset of dizziness. Attempted to drive a vehicle with the experienced a second episode of dizziness. One episode of nausea.  EXAM: MRI HEAD WITHOUT CONTRAST  TECHNIQUE: Multiplanar, multiecho pulse  sequences of the brain and surrounding structures were obtained without intravenous contrast.  COMPARISON:  CT head without contrast from the same day.  FINDINGS: A 12 mm focus of acute nonhemorrhagic infarction is present along the inferior aspect of the left cerebellum. A second punctate acute infarct is present more posteriorly in the left cerebellum.  Mild scattered subcortical T2 changes are present bilaterally. No other acute infarct is present.  Ventricles are of normal size. No significant extra-axial fluid is present.  The internal auditory canals are within normal limits. The brainstem is unremarkable.  Abnormal signal in left vertebral artery suggests slow or occluded flow. There is also abnormal signal within the petrous and precavernous segment of the left internal carotid artery. The MCA branch vessels demonstrate normal flow.  Bilateral lens replacements are present. The globes and orbits are otherwise intact. The paranasal sinuses and mastoid air cells are  clear. Midline structures are unremarkable.  IMPRESSION: 1. Acute nonhemorrhagic 12 mm infarct within the inferior left cerebellum. 2. Second punctate infarct also in the left cerebellum. 3. Abnormal signal in left vertebral artery suggesting slow or occluded flow. 4. Question abnormal signal in the precavernous left internal carotid artery, also suggesting slow flow. 5. Mild subcortical white matter changes, slightly exaggerated for age. These results were called by telephone at the time of interpretation on 01/16/2015 at 9:41 pm to Dr. Antony Blackbird , who verbally acknowledged these results.   Electronically Signed   By: San Morelle M.D.   On: 01/16/2015 21:41    EKG: Independently reviewed. NSR, no acute ST segment changes  Assessment/Plan Active Problems:   Stroke [facility])   CVA (cerebral infarction)   1. Admit to telemetry  2.  Acute CVA --Cartoid dopplers, echo, A1c, fasting lipid panel --Continue full strength aspirin for  now --PT eval and treat --Fall precautions  3.  HTN --Continue lisinopril  4.  GERD --Pepcid BID  Code Status: FULL Family Communication: Patient alone Disposition Plan: Expect at least a two midnight stay   Time spent: 60 minutes  [employer] [facility]  01/17/2015, 3:00 AM

## 2015-01-18 LAB — HEMOGLOBIN A1C
HEMOGLOBIN A1C: 5.9 % — AB (ref 4.8–5.6)
MEAN PLASMA GLUCOSE: 123 mg/dL

## 2015-01-18 MED ORDER — ALBUTEROL SULFATE HFA 108 (90 BASE) MCG/ACT IN AERS: 2.0000 | INHALATION_SPRAY | Freq: Four times a day (QID) | RESPIRATORY_TRACT | Status: DC | PRN

## 2015-01-18 NOTE — Progress Notes (Signed)
Pt given discharge instructions, prescriptions, and care notes. Pt verbalized understanding AEB no further questions or concerns at this time. IV was discontinued, no redness, pain, or swelling noted at this time. Telemetry discontinued and Centralized Telemetry was notified. Pt left the floor via ambulation with staff in stable condition. Cody Willis.

## 2015-01-18 NOTE — Care Management Note (Signed)
Case Management Note  Patient Details  Name: Cody Willis MRN: [id number] Date of Birth: 1940-08-04  Subjective/Objective:                 Patient to discharge to home today with wife. No follow up needed from PT. Anticoag is ASA. No CM needs identified at this time  Action/Plan:   Expected Discharge Date:                  Expected Discharge Plan:  Home/Self Care  In-House Referral:     Discharge planning Services  CM Consult  Post Acute Care Choice:    Choice offered to:     DME Arranged:    DME Agency:     HH Arranged:    [place] Agency:     Status of Service:  Completed, signed off  Medicare Important Message Given:    Date Medicare IM Given:    Medicare IM give by:    Date Additional Medicare IM Given:    Additional Medicare Important Message give by:     If discussed at [place] of Stay Meetings, dates discussed:    Additional Comments:  Carles Collet, RN 01/18/2015, 10:48 AM

## 2015-01-18 NOTE — Discharge Summary (Signed)
Discharge Summary  Cody Willis TIR:[id number] DOB: [birth year]  PCP: Morton Peters, MD  Admit date: 01/16/2015 Discharge date: 01/18/2015  Time spent: <38mins  Recommendations for Outpatient Follow-up:  1. F/u with PMD within a week for hospital follow up. 2. F/u with neurology as scheduled for CVA.  Discharge Diagnoses:  Active Hospital Problems   Diagnosis Date Noted  . Stroke ([place]) 01/17/2015  . CVA (cerebral infarction) 01/17/2015  . HLD (hyperlipidemia)   . 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 Tobacco use disorder     Resolved Hospital Problems   Diagnosis Date Noted Date Resolved  No resolved problems to display.    Discharge Condition: stable  Diet recommendation: heart healthy  Filed Weights   01/16/15 1721 01/17/15 0305  Weight: 165 lb (74.844 kg) 171 lb 1.2 oz (77.6 kg)    History of present illness:  Cody Willis is a 74 y.o. gentleman who still works full time as a Administrator with a history of HTN, dyslipidemia, and GERD. He was in his baseline state of health until the day of presentation. He developed the acute onset of vertigo and near syncope [place] He fell down but he denies loss of consciousness. He has had intermittent headache with a single episode of nausea and vomiting. No chest pain or shortness of breath. No lower urinary tract symptoms. No overt blood loss. ED evaluation concerning for acute left cerebellar CVA. Neurology has seen the patient; hospitalist asked to admit.   Hospital Course:  Active Problems:   Stroke [facility])   CVA (cerebral infarction)   HLD (hyperlipidemia)   Dissection of vertebral artery ([facility])   Tobacco use disorder  Left cerebellum nonhemorrhagic CVA (51mm in the inferior left cerebellum, second punctate infarct in the left cerebellum): Echo /ldl/blood sugar wnl,  Symptom resolved, appreciated neurology input, dual antiplatelet with asa/plavix for three months then plavix,  avoid hypotension, stop smoking per neurology.   htn: decrease lisinopril to 10mg  po qd, home bp monitor, per neurology sbp need to be on the higher end of normal range to ensure cerebral perfusion due to narrowed left VBA (CTA head and neck left VA high grade stenosis versus occlusion, likely dissection versus atherosclerosis)  Long-term SBP goal 120-140  Smoking: cessation education provided, agree with nicotine patch. Reported intermittent wheezing, suspect baseline copd,start albuterol prn, pmd follow up.   Code Status: full  Family Communication: patient and family  Disposition Plan: home 10/11   Consultants:  neurology  Procedures:  none  Antibiotics:  none  Discharge Exam: BP 116/57 mmHg  Pulse 58  Temp(Src) 97.7 F (36.5 C) (Oral)  Resp 18  Ht 5\' 7"  (1.702 m)  Wt 171 lb 1.2 oz (77.6 kg)  BMI 26.79 kg/m2  SpO2 94%   General: NAD  Cardiovascular: RRR  Respiratory: CTABL  Abdomen: Soft/ND/NT, positive BS  Musculoskeletal: No Edema  Neuro: aaox3  Discharge Instructions You were cared for by a hospitalist during your hospital stay. If you have any questions about your discharge medications or the care you received while you were in the hospital after you are discharged, you can call the unit and asked to speak with the hospitalist on call if the hospitalist that took care of you is not available. Once you are discharged, your primary care physician will handle any further medical issues. Please note that NO REFILLS for any discharge medications will be authorized once you are discharged, as it is imperative that  you return to your primary care physician (or establish a relationship with a primary care physician if you do not have one) for your aftercare needs so that they can reassess your need for medications and monitor your lab values.  Discharge Instructions    Ambulatory referral to Neurology    Complete by:  As directed   Pt will follow up with Dr.  Erlinda Hong at [facility] in about 2 months. Thanks.     Diet - low sodium heart healthy    Complete by:  As directed   Low salt, low fat     Increase activity slowly    Complete by:  As directed             Medication List    TAKE these medications        albuterol 108 (90 BASE) MCG/ACT inhaler  Commonly known as:  PROVENTIL HFA;VENTOLIN HFA  Inhale 2 puffs into the lungs every 6 (six) hours as needed for wheezing or shortness of breath.     aspirin 81 MG EC tablet  Take 1 tablet (81 mg total) by mouth daily.     clopidogrel 75 MG tablet  Commonly known as:  PLAVIX  Take 1 tablet (75 mg total) by mouth daily.     cyanocobalamin 1000 MCG/ML injection  Commonly known as:  (VITAMIN B-12)  Inject 1 mL (1,000 mcg total) into the muscle every 30 (thirty) days.     famotidine 20 MG tablet  Commonly known as:  PEPCID  Take 20 mg by mouth 2 (two) times daily as needed for heartburn or indigestion.     lisinopril 10 MG tablet  Commonly known as:  PRINIVIL,ZESTRIL  Take 1 tablet (10 mg total) by mouth daily.     loperamide 2 MG tablet  Commonly known as:  IMODIUM A-D  Take 2 mg by mouth as needed for diarrhea or loose stools.     lovastatin 40 MG tablet  Commonly known as:  MEVACOR  Take 1 tablet (40 mg total) by mouth at bedtime.     Pancrelipase (Lip-Prot-Amyl) 25000 UNITS Cpep  Commonly known as:  [name]  Take 3 capsules by mouth 3 (three) times daily.       Allergies  Allergen Reactions  . Sulfamethoxazole-Trimethoprim Rash       Follow-up Information    Follow up with Daion Ginsberg,Jindong, MD. Schedule an appointment as soon as possible for a visit in 2 months.   Specialty:  Neurology   Why:  stroke clinic   Contact information:   [address] [place]  [place] [address] [phone]       Follow up with Viviana Simpler, MD In 1 week.   Specialties:  Internal Medicine, Pediatrics   Why:  hospital follow up   Contact information:   [address]  [place] [address] [phone]        The results of significant diagnostics from this hospitalization (including imaging, microbiology, ancillary and laboratory) are listed below for reference.    Significant Diagnostic Studies: Ct Angio Head W/cm &/or Wo Cm  01/17/2015   CLINICAL DATA:  Followup stroke.  Hypertension history.  EXAM: CT ANGIOGRAPHY HEAD AND NECK  TECHNIQUE: Multidetector CT imaging of the head and neck was performed using the standard protocol during bolus administration of intravenous contrast. Multiplanar CT image reconstructions and MIPs were obtained to evaluate the vascular anatomy. Carotid stenosis measurements (when applicable) are obtained utilizing NASCET criteria, using the distal internal carotid  diameter as the denominator.  CONTRAST:  80mL OMNIPAQUE IOHEXOL 350 MG/ML SOLN  COMPARISON:  Brain MRI from yesterday  FINDINGS: CTA NECK  Aortic arch: 3 vessel branching. Mild atheromatous wall thickening. No indication of dissection.  Right carotid system: Atherosclerosis, noncalcified, at the bifurcation without flow limiting stenosis. No evidence of acute dissection. No indication of fibromuscular dysplasia of the ICA.  Left carotid system: Abnormally narrow but smooth appearance of the common and internal carotid arteries, especially in the setting of large left posterior communicating artery. Occlusion or pre occlusion at the petrous cavernous junction where there are wispy vessels suggesting well established collaterals. No surrounding soft tissue inflammation or visible mural hematoma. Findings likely sequela of remote dissection.  Vertebral arteries:Proximal subclavian arteries are patent. The non dominant left vertebral artery has poor flow from the origin with intermittent partial reconstitution, consistent with recent dissection. Dissection continues to the vertebrobasilar junction.  Skeleton: Multilevel degenerative disc and facet disease. No acute finding.  Other neck: No  incidentally detected mass or adenopathy.  Bronchial wall thickening and centrilobular emphysema.  CTA HEAD  Anterior circulation: As noted above, there is diminutive left ICA, especially at the cavernous petrous junction. Left A1 is hypoplastic but there is a large left posterior communicating artery. No major branch occlusion or acute intracranial dissection suspected. Negative for aneurysm.  Posterior circulation: Left vertebral artery dissection continues to the vertebrobasilar junction. The left PICA is opacified. Dominant right vertebral artery is widely patent, as is the basilar. Dominant right AICA. Patent superior cerebellar and posterior cerebral arteries. As above, large left posterior communicating artery. Negative for aneurysm.  Venous sinuses: Patent  Anatomic variants: Hypoplastic left A1 and large left posterior communicating artery.  Delayed phase: No abnormal parenchymal enhancement.  These results were called by telephone at the time of interpretation on 01/17/2015 at 10:25 am to Dr. Rosalin Hawking , who verbally acknowledged these results.  IMPRESSION: 1. 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 No evidence of hemorrhagic conversion or progressive left cerebellar infarction since brain MRI yesterday; the left PICA is open proximally. 2. Changes of remote left ICA dissection with effective occlusion at the petrous cavernous junction. Left A1 segment is hypoplastic but there is large and patent left posterior communicating artery. 3. Atherosclerosis without flow limiting stenosis. 4. Emphysema.   Electronically Signed   By: Monte Fantasia M.D.   On: 01/17/2015 10:26   Dg Chest 2 View  01/16/2015   CLINICAL DATA:  Nancy Fetter onset of dizziness.  EXAM: CHEST  2 VIEW  COMPARISON:  05/17/2014  FINDINGS: Cardiomediastinal silhouette is normal. Mediastinal contours appear intact.  There is no evidence of focal airspace consolidation, pleural effusion or  pneumothorax.  Osseous structures are without acute abnormality. Soft tissues are grossly normal.  IMPRESSION: No radiographic evidence of acute cardiopulmonary abnormality.   Electronically Signed   By: Fidela Salisbury M.D.   On: 01/16/2015 18:48   Ct Head Wo Contrast  01/16/2015   CLINICAL DATA:  74 year old male with new onset of dizziness. History of fall to the ground. Diaphoresis and vomiting.  EXAM: CT HEAD WITHOUT CONTRAST  TECHNIQUE: Contiguous axial images were obtained from the base of the skull through the vertex without intravenous contrast.  COMPARISON:  No priors.  FINDINGS: Patchy areas of mild decreased attenuation are noted throughout the deep and periventricular white matter of the cerebral hemispheres bilaterally, compatible with mild chronic microvascular ischemic disease. No acute displaced skull fractures are identified. No  acute intracranial abnormality. Specifically, no evidence of acute post-traumatic intracranial hemorrhage, no definite regions of acute/subacute cerebral ischemia, no focal mass, mass effect, hydrocephalus or abnormal intra or extra-axial fluid collections. The visualized paranasal sinuses and mastoids are well pneumatized.  IMPRESSION: 1. No acute intracranial abnormalities. 2. Very mild chronic microvascular ischemic changes in the cerebral white matter, as above.   Electronically Signed   By: Vinnie Langton M.D.   On: 01/16/2015 18:41   Ct Angio Neck W/cm &/or Wo/cm  01/17/2015   CLINICAL DATA:  Followup stroke.  Hypertension history.  EXAM: CT ANGIOGRAPHY HEAD AND NECK  TECHNIQUE: Multidetector CT imaging of the head and neck was performed using the standard protocol during bolus administration of intravenous contrast. Multiplanar CT image reconstructions and MIPs were obtained to evaluate the vascular anatomy. Carotid stenosis measurements (when applicable) are obtained utilizing NASCET criteria, using the distal internal carotid diameter as the  denominator.  CONTRAST:  32mL OMNIPAQUE IOHEXOL 350 MG/ML SOLN  COMPARISON:  Brain MRI from yesterday  FINDINGS: CTA NECK  Aortic arch: 3 vessel branching. Mild atheromatous wall thickening. No indication of dissection.  Right carotid system: Atherosclerosis, noncalcified, at the bifurcation without flow limiting stenosis. No evidence of acute dissection. No indication of fibromuscular dysplasia of the ICA.  Left carotid system: Abnormally narrow but smooth appearance of the common and internal carotid arteries, especially in the setting of large left posterior communicating artery. Occlusion or pre occlusion at the petrous cavernous junction where there are wispy vessels suggesting well established collaterals. No surrounding soft tissue inflammation or visible mural hematoma. Findings likely sequela of remote dissection.  Vertebral arteries:Proximal subclavian arteries are patent. The non dominant left vertebral artery has poor flow from the origin with intermittent partial reconstitution, consistent with recent dissection. Dissection continues to the vertebrobasilar junction.  Skeleton: Multilevel degenerative disc and facet disease. No acute finding.  Other neck: No incidentally detected mass or adenopathy.  Bronchial wall thickening and centrilobular emphysema.  CTA HEAD  Anterior circulation: As noted above, there is diminutive left ICA, especially at the cavernous petrous junction. Left A1 is hypoplastic but there is a large left posterior communicating artery. No major branch occlusion or acute intracranial dissection suspected. Negative for aneurysm.  Posterior circulation: Left vertebral artery dissection continues to the vertebrobasilar junction. The left PICA is opacified. Dominant right vertebral artery is widely patent, as is the basilar. Dominant right AICA. Patent superior cerebellar and posterior cerebral arteries. As above, large left posterior communicating artery. Negative for aneurysm.  Venous  sinuses: Patent  Anatomic variants: Hypoplastic left A1 and large left posterior communicating artery.  Delayed phase: No abnormal parenchymal enhancement.  These results were called by telephone at the time of interpretation on 01/17/2015 at 10:25 am to Dr. Rosalin Hawking , who verbally acknowledged these results.  IMPRESSION: 1. 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 No evidence of hemorrhagic conversion or progressive left cerebellar infarction since brain MRI yesterday; the left PICA is open proximally. 2. Changes of remote left ICA dissection with effective occlusion at the petrous cavernous junction. Left A1 segment is hypoplastic but there is large and patent left posterior communicating artery. 3. Atherosclerosis without flow limiting stenosis. 4. Emphysema.   Electronically Signed   By: Monte Fantasia M.D.   On: 01/17/2015 10:26   Mr Brain Wo Contrast  01/16/2015   CLINICAL DATA:  Acute onset of dizziness. Attempted to drive a vehicle with the experienced a second episode of  dizziness. One episode of nausea.  EXAM: MRI HEAD WITHOUT CONTRAST  TECHNIQUE: Multiplanar, multiecho pulse sequences of the brain and surrounding structures were obtained without intravenous contrast.  COMPARISON:  CT head without contrast from the same day.  FINDINGS: A 12 mm focus of acute nonhemorrhagic infarction is present along the inferior aspect of the left cerebellum. A second punctate acute infarct is present more posteriorly in the left cerebellum.  Mild scattered subcortical T2 changes are present bilaterally. No other acute infarct is present.  Ventricles are of normal size. No significant extra-axial fluid is present.  The internal auditory canals are within normal limits. The brainstem is unremarkable.  Abnormal signal in left vertebral artery suggests slow or occluded flow. There is also abnormal signal within the petrous and precavernous segment of the left internal  carotid artery. The MCA branch vessels demonstrate normal flow.  Bilateral lens replacements are present. The globes and orbits are otherwise intact. The paranasal sinuses and mastoid air cells are clear. Midline structures are unremarkable.  IMPRESSION: 1. Acute nonhemorrhagic 12 mm infarct within the inferior left cerebellum. 2. Second punctate infarct also in the left cerebellum. 3. Abnormal signal in left vertebral artery suggesting slow or occluded flow. 4. Question abnormal signal in the precavernous left internal carotid artery, also suggesting slow flow. 5. Mild subcortical white matter changes, slightly exaggerated for age. These results were called by telephone at the time of interpretation on 01/16/2015 at 9:41 pm to Dr. Antony Blackbird , who verbally acknowledged these results.   Electronically Signed   By: San Morelle M.D.   On: 01/16/2015 21:41    Microbiology: No results found for this or any previous visit (from the past 240 hour(s)).   Labs: Basic Metabolic Panel:  Recent Labs Lab 01/16/15 1734  NA 142  K 4.1  CL 104  CO2 29  GLUCOSE 116*  BUN 20  CREATININE 1.17  CALCIUM 9.4   Liver Function Tests: No results for input(s): AST, ALT, ALKPHOS, BILITOT, PROT, ALBUMIN in the last 168 hours. No results for input(s): LIPASE, AMYLASE in the last 168 hours. No results for input(s): AMMONIA in the last 168 hours. CBC:  Recent Labs Lab 01/16/15 1734  WBC 8.3  HGB 15.2  HCT 44.6  MCV 88.7  PLT 154   Cardiac Enzymes: No results for input(s): CKTOTAL, CKMB, CKMBINDEX, TROPONINI in the last 168 hours. BNP: BNP (last 3 results) No results for input(s): BNP in the last 8760 hours.  ProBNP (last 3 results) No results for input(s): PROBNP in the last 8760 hours.  CBG:  Recent Labs Lab 01/16/15 1733  GLUCAP 104*       Signed:  Lugene Beougher MD, PhD  [facility] 01/18/2015, 10:26 AM

## 2015-01-18 NOTE — Evaluation (Signed)
Physical Therapy Evaluation Patient Details Name: Cody Willis MRN: [id number] DOB: 1940-10-07 Today's Date: 01/18/2015   History of Present Illness  Patient is a 74 y/o male admitted due to acute vertigo and nausea. MRI-acute 2 left small cerebellar punctate infarcts. PMH includes chronic pancreatitis and HTN.  Clinical Impression  Patient presents with mild dizziness with horizontal head movements during mobility but no overt LOB. Tolerated ambulation and stair training without difficulty. Education re: warning signs/symptoms of CVA, awaiting clearance from MD before driving/work and habituation strategies to assist with head movements and sudden turns during mobility. All education completed. Will have support from wife at home. Pt does not require further skilled therapy services. Discharge from therapy.     Follow Up Recommendations No PT follow up;Supervision - Intermittent    Equipment Recommendations  None recommended by PT    Recommendations for Other Services       Precautions / Restrictions Precautions Precautions: Fall Restrictions Weight Bearing Restrictions: No      Mobility  Bed Mobility               General bed mobility comments: Sitting in chair upon PT arrival.   Transfers Overall transfer level: Needs assistance Equipment used: None Transfers: Sit to/from Stand Sit to Stand: Independent         General transfer comment: Stood from chair x1. Cues to wait before moving after changes in position.  Ambulation/Gait Ambulation/Gait assistance: Modified independent (Device/Increase time) Ambulation Distance (Feet): 500 Feet Assistive device: None Gait Pattern/deviations: Step-through pattern;Decreased stride length   Gait velocity interpretation: at or above normal speed for age/gender General Gait Details: Steady gait; mild deviations in gait with horizontal head turns but no overt LOB. Some episode of dizziness reported with head  movements.  Stairs Stairs: Yes Stairs assistance: Modified independent (Device/Increase time) Stair Management: Alternating pattern;One rail Right Number of Stairs: 13 General stair comments: No dizziness reported.   Wheelchair Mobility    Modified Rankin (Stroke Patients Only) Modified Rankin (Stroke Patients Only) Pre-Morbid Rankin Score: No symptoms Modified Rankin: Slight disability     Balance Overall balance assessment: Needs assistance Sitting-balance support: Feet supported;No upper extremity supported Sitting balance-Leahy Scale: Normal     Standing balance support: During functional activity Standing balance-Leahy Scale: Fair               High level balance activites: Backward walking;Direction changes;Sudden stops;Head turns;Turns High Level Balance Comments: Tolerated above activities with only mild deviations in gait and dizziness with horizontal head turns.              Pertinent Vitals/Pain Pain Assessment: No/denies pain    Home Living   Living Arrangements: Spouse/significant other Available Help at Discharge: Family;Available 24 hours/day Type of Home: House Home Access: Stairs to enter   [employer] of Steps: 2 Home Layout: One level Home Equipment: None      Prior Function Level of Independence: Independent         Comments: Works as Administrator.     Hand Dominance        Extremity/Trunk Assessment   Upper Extremity Assessment: Overall WFL for tasks assessed;Defer to OT evaluation           Lower Extremity Assessment: Overall WFL for tasks assessed         Communication   Communication: No difficulties  Cognition Arousal/Alertness: Awake/alert Behavior During Therapy: WFL for tasks assessed/performed Overall Cognitive Status: Within Functional Limits for tasks assessed  General Comments General comments (skin integrity, edema, etc.): Wife present in room during  session.    Exercises        Assessment/Plan    PT Assessment Patent does not need any further PT services  PT Diagnosis Difficulty walking   PT Problem List    PT Treatment Interventions     PT Goals (Current goals can be found in the Care Plan section) Acute Rehab PT Goals Patient Stated Goal: to go home PT Goal Formulation: All assessment and education complete, DC therapy    Frequency     Barriers to discharge        Co-evaluation               End of Session Equipment Utilized During Treatment: Gait belt Activity Tolerance: Patient tolerated treatment well Patient left: in bed;with call bell/phone within reach;with family/visitor present Nurse Communication: Mobility status         Time: 0814-4818 PT Time Calculation (min) (ACUTE ONLY): 15 min   Charges:   PT Evaluation $Initial PT Evaluation Tier I: 1 Procedure     PT G Codes:        Lashunda Greis A Ayasha Ellingsen 01/18/2015, 10:15 AM  Wray Kearns, PT, DPT [phone]

## 2015-01-21 ENCOUNTER — Ambulatory Visit (INDEPENDENT_AMBULATORY_CARE_PROVIDER_SITE_OTHER): Payer: PPO | Admitting: Internal Medicine

## 2015-01-21 ENCOUNTER — Encounter: Payer: Self-pay | Admitting: Internal Medicine

## 2015-01-21 VITALS — BP 108/60 | HR 74 | Temp 98.4°F | Wt 162.0 lb

## 2015-01-21 DIAGNOSIS — I10 Essential (primary) hypertension: Secondary | ICD-10-CM

## 2015-01-21 DIAGNOSIS — I7774 Dissection of vertebral artery: Secondary | ICD-10-CM

## 2015-01-21 DIAGNOSIS — I639 Cerebral infarction, unspecified: Secondary | ICD-10-CM

## 2015-01-21 MED ORDER — CLOPIDOGREL BISULFATE 75 MG PO TABS: 75.0000 mg | ORAL_TABLET | Freq: Every day | ORAL | Status: DC

## 2015-01-21 NOTE — Progress Notes (Signed)
Pre visit review using our clinic review tool, if applicable. No additional management support is needed unless otherwise documented below in the visit note. 

## 2015-01-21 NOTE — Assessment & Plan Note (Signed)
Has neuro follow up in December

## 2015-01-21 NOTE — Assessment & Plan Note (Signed)
With acute vertigo No significant residual ASA/plavix On statin already

## 2015-01-21 NOTE — Assessment & Plan Note (Signed)
BP Readings from Last 3 Encounters:  01/21/15 108/60  01/18/15 116/57  08/17/14 120/70   BP below current goal Will stop the lisinopril

## 2015-01-21 NOTE — Patient Instructions (Signed)
Stop the lisinopril

## 2015-01-21 NOTE — Progress Notes (Signed)
Subjective:    Patient ID: Cody Willis, male    DOB: 12-Feb-1941, 74 y.o.   MRN: [id number]  HPI Here for follow up of hospitalization Records reviewed Here with wife  Martin [place]estic [place] [facility] [place] He leaned forward--then corrected--and wound up falling backwards Had feeling the the earth was moving and he was holding onto the grass for support. Then it seemed to improve. Happened again when he was in car EMT called but he declined transfer-- then went after second episode with the nausea Cerebellar CVA found  On ASA and plavix  Checking BP at home 114/56-140/72  Current Outpatient Prescriptions on File Prior to Visit  Medication Sig Dispense Refill  . albuterol (PROVENTIL HFA;VENTOLIN HFA) 108 (90 BASE) MCG/ACT inhaler Inhale 2 puffs into the lungs every 6 (six) hours as needed for wheezing or shortness of breath. 1 Inhaler 2  . aspirin EC 81 MG EC tablet Take 1 tablet (81 mg total) by mouth daily. 30 tablet 0  . clopidogrel (PLAVIX) 75 MG tablet Take 1 tablet (75 mg total) by mouth daily. 30 tablet 0  . cyanocobalamin (,VITAMIN B-12,) 1000 MCG/ML injection Inject 1 mL (1,000 mcg total) into the muscle every 30 (thirty) days. 10 mL 12  . famotidine (PEPCID) 20 MG tablet Take 20 mg by mouth 2 (two) times daily as needed for heartburn or indigestion.    Marland Kitchen lisinopril (PRINIVIL,ZESTRIL) 10 MG tablet Take 1 tablet (10 mg total) by mouth daily. 30 tablet 0  . loperamide (IMODIUM A-D) 2 MG tablet Take 2 mg by mouth as needed for diarrhea or loose stools.     . lovastatin (MEVACOR) 40 MG tablet Take 1 tablet (40 mg total) by mouth at bedtime. 90 tablet 3  . Pancrelipase, Lip-Prot-Amyl, ([name]) 25000 UNITS CPEP Take 3 capsules by mouth 3 (three) times daily. 270 capsule 1   No current facility-administered medications on file prior to visit.    Allergies  Allergen Reactions  . Sulfamethoxazole-Trimethoprim Rash    Past Medical History  Diagnosis Date  . Hiatal  hernia   . Esophageal reflux   . Chronic pancreatitis ([place])   . Other B-complex deficiencies   . Essential hypertension, benign   . Pure hypercholesterolemia     Past Surgical History  Procedure Laterality Date  . Appendectomy    . Cholecystectomy    . Back surgery      Diskectomy between L4 and L5  . Cataract extraction w/ intraocular lens  implant, bilateral  12/15 and 2/16  . Melanoma excision  ~2009    right upper abdomen    Family History  Problem Relation Age of Onset  . Crohn's disease Sister   . Colitis Brother   . Colon cancer Neg Hx     Social History   Social History  . Marital Status: Married    Spouse Name: N/A  . Number of Children: 2  . Years of Education: N/A   Occupational History  . Truck driver-- local     part time   Social History Main Topics  . Smoking status: Former Smoker -- 1.00 packs/day    Types: Cigarettes    Quit date: 01/16/2015  . Smokeless tobacco: Never Used     Comment: Counseling sheet given in exam room to quit smoking   . Alcohol Use: No  . Drug Use: No  . Sexual Activity: Not on file   Other Topics Concern  . Not on file   Social  History Narrative   No living will   Wife should make decisions for him if needed   Review of Systems  Still gets swimmy headed trying to take bath--hard washing hands Mild nausea last night--did finally ease up No trouble walking --but taking it slow and careful Sleeping okay till last night Wonders about driving--told to wait on truck driving till neuro follow up     Objective:   Physical Exam  Constitutional: He is oriented to person, place, and time. He appears well-developed and well-nourished. No distress.  HENT:  Mouth/Throat: Oropharynx is clear and moist. No oropharyngeal exudate.  Neurological: He is alert and oriented to person, place, and time. No cranial nerve deficit. He displays a negative Romberg sign. Coordination and gait normal.          Assessment & Plan:

## 2015-02-08 ENCOUNTER — Emergency Department (HOSPITAL_COMMUNITY)
Admission: EM | Admit: 2015-02-08 | Discharge: 2015-02-08 | Disposition: A | Discharge status: Home / Self Care | Payer: PPO | Attending: Emergency Medicine | Admitting: Emergency Medicine

## 2015-02-08 ENCOUNTER — Encounter (HOSPITAL_COMMUNITY): Payer: Self-pay

## 2015-02-08 ENCOUNTER — Emergency Department (HOSPITAL_COMMUNITY): Payer: PPO

## 2015-02-08 DIAGNOSIS — K861 Other chronic pancreatitis: Secondary | ICD-10-CM | POA: Insufficient documentation

## 2015-02-08 DIAGNOSIS — R42 Dizziness and giddiness: Secondary | ICD-10-CM | POA: Insufficient documentation

## 2015-02-08 DIAGNOSIS — Z87891 Personal history of nicotine dependence: Secondary | ICD-10-CM | POA: Diagnosis not present

## 2015-02-08 DIAGNOSIS — E7800 Pure hypercholesterolemia, unspecified: Secondary | ICD-10-CM | POA: Diagnosis not present

## 2015-02-08 DIAGNOSIS — Z7982 Long term (current) use of aspirin: Secondary | ICD-10-CM | POA: Diagnosis not present

## 2015-02-08 DIAGNOSIS — Z7901 Long term (current) use of anticoagulants: Secondary | ICD-10-CM | POA: Diagnosis not present

## 2015-02-08 DIAGNOSIS — Z8673 Personal history of transient ischemic attack (TIA), and cerebral infarction without residual deficits: Secondary | ICD-10-CM | POA: Insufficient documentation

## 2015-02-08 DIAGNOSIS — I10 Essential (primary) hypertension: Secondary | ICD-10-CM | POA: Insufficient documentation

## 2015-02-08 HISTORY — DX: Cerebral infarction, unspecified: I63.9

## 2015-02-08 LAB — CBC WITH DIFFERENTIAL/PLATELET
BASOS PCT: 1 %
Basophils Absolute: 0.1 10*3/uL (ref 0.0–0.1)
EOS ABS: 0.2 10*3/uL (ref 0.0–0.7)
EOS PCT: 3 %
HCT: 42.3 % (ref 39.0–52.0)
HEMOGLOBIN: 14.2 g/dL (ref 13.0–17.0)
Lymphocytes Relative: 18 %
Lymphs Abs: 1.3 10*3/uL (ref 0.7–4.0)
MCH: 29.4 pg (ref 26.0–34.0)
MCHC: 33.6 g/dL (ref 30.0–36.0)
MCV: 87.6 fL (ref 78.0–100.0)
MONOS PCT: 14 %
Monocytes Absolute: 1 10*3/uL (ref 0.1–1.0)
NEUTROS PCT: 64 %
Neutro Abs: 4.6 10*3/uL (ref 1.7–7.7)
PLATELETS: 140 10*3/uL — AB (ref 150–400)
RBC: 4.83 MIL/uL (ref 4.22–5.81)
RDW: 14.3 % (ref 11.5–15.5)
WBC: 7.2 10*3/uL (ref 4.0–10.5)

## 2015-02-08 LAB — URINALYSIS, ROUTINE W REFLEX MICROSCOPIC
BILIRUBIN URINE: NEGATIVE
GLUCOSE, UA: NEGATIVE mg/dL
KETONES UR: NEGATIVE mg/dL
Leukocytes, UA: NEGATIVE
Nitrite: NEGATIVE
PROTEIN: NEGATIVE mg/dL
Specific Gravity, Urine: 1.014 (ref 1.005–1.030)
UROBILINOGEN UA: 1 mg/dL (ref 0.0–1.0)
pH: 5.5 (ref 5.0–8.0)

## 2015-02-08 LAB — COMPREHENSIVE METABOLIC PANEL
ALT: 19 U/L (ref 17–63)
ANION GAP: 7 (ref 5–15)
AST: 23 U/L (ref 15–41)
Albumin: 3.9 g/dL (ref 3.5–5.0)
Alkaline Phosphatase: 57 U/L (ref 38–126)
BUN: 15 mg/dL (ref 6–20)
CHLORIDE: 103 mmol/L (ref 101–111)
CO2: 28 mmol/L (ref 22–32)
CREATININE: 1.13 mg/dL (ref 0.61–1.24)
Calcium: 9.1 mg/dL (ref 8.9–10.3)
Glucose, Bld: 103 mg/dL — ABNORMAL HIGH (ref 65–99)
Potassium: 4 mmol/L (ref 3.5–5.1)
SODIUM: 138 mmol/L (ref 135–145)
Total Bilirubin: 0.8 mg/dL (ref 0.3–1.2)
Total Protein: 5.9 g/dL — ABNORMAL LOW (ref 6.5–8.1)

## 2015-02-08 LAB — URINE MICROSCOPIC-ADD ON

## 2015-02-08 NOTE — Consult Note (Signed)
Neurology Consultation Reason for Consult: Dizziness Referring Physician: Alvino Chapel, N  CC: Dizziness  History is obtained from: Patient, wife  HPI: Cody Willis is a 74 y.o. male with a history of stroke from vertebral dissection that occurred October 9. He is currently on dual antiplatelet therapy with aspirin and Plavix. He states that about a week ago, he had symptoms similar to what he experienced with his previous stroke, consisting of vertigo which lasted for several hours. He then this morning felt "clammy" as well as a posterior headache. He was concerned he might be having a "seizure." And therefore sought further care in the emergency room.   LKW: One week ago tpa given?: no, recent stroke    ROS: A 14 point ROS was performed and is negative except as noted in the HPI.   Past Medical History  Diagnosis Date  . Hiatal hernia   . Esophageal reflux   . Chronic pancreatitis ([place])   . Other B-complex deficiencies   . Essential hypertension, benign   . Pure hypercholesterolemia   . Stroke ([place]) 01/16/2015    Pt. started on plavix     Family History  Problem Relation Age of Onset  . Crohn's disease Sister   . Colitis Brother   . Colon cancer Neg Hx      Social History:  reports that he quit smoking about 3 weeks ago. His smoking use included Cigarettes. He smoked 1.00 pack per day. He has never used smokeless tobacco. He reports that he does not drink alcohol or use illicit drugs.   Exam: Current vital signs: BP 115/67 mmHg  Pulse 57  Temp(Src) 97.9 F (36.6 C) (Oral)  Resp 16  Ht 5\' 7"  (1.702 m)  Wt 74.844 kg (165 lb)  BMI 25.84 kg/m2  SpO2 94% Vital signs in last 24 hours: Temp:  [97.9 F (36.6 C)] 97.9 F (36.6 C) (11/01 1058) Pulse Rate:  [56-66] 57 (11/01 1315) Resp:  [14-21] 16 (11/01 1315) BP: (115-135)/(51-77) 115/67 mmHg (11/01 1315) SpO2:  [92 %-100 %] 94 % (11/01 1315) Weight:  [74.844 kg (165 lb)] 74.844 kg (165 lb) (11/01  1058)  Physical Exam  Constitutional: Appears well-developed and well-nourished.  Psych: Affect appropriate to situation Eyes: No scleral injection HENT: No OP obstrucion Head: Normocephalic.  Cardiovascular: Normal rate and regular rhythm.  Respiratory: Effort normal and breath sounds normal to anterior ascultation GI: Soft.  No distension. There is no tenderness.  Skin: WDI  Neuro: Mental Status: Patient is awake, alert, oriented to person, place, month, year, and situation. Patient is able to give a clear and coherent history. No signs of aphasia or neglect Cranial Nerves: II: Visual Fields are full. Pupils are equal, round, and reactive to light.   III,IV, VI: EOMI without ptosis or diploplia.  V: Facial sensation is symmetric to temperature VII: Facial movement is symmetric.  VIII: hearing is intact to voice X: Uvula elevates symmetrically XI: Shoulder shrug is symmetric. XII: tongue is midline without atrophy or fasciculations.  Motor: Tone is normal. Bulk is normal. 5/5 strength was present in all four extremities.  Sensory: Sensation is symmetric to light touch and temperature in the arms and legs. Deep Tendon Reflexes: 2+ and symmetric in the biceps and patellae.  Plantars: Toes are downgoing bilaterally.  Cerebellar: FNF and HKS are intact bilaterally    I have reviewed labs in epic and the results pertinent to this consultation are: CMP-unremarkable  I have reviewed the images obtained: Previous MRI small  caliber vertebral artery with punctate infarcts  Impression: 74 year old male with recurrent episodes of vertigo in the setting of high-grade stenosis(likely dissection) of his vertebral artery. I suspect that he may have intermittent hypoperfusion due to low blood pressures but an MRI could confirm if he is having new recurrent strokes.  Recommendations: 1) MRI brain 2) if negative for stroke, then I would favor reducing his antihypertensive  therapy.   Roland Rack, MD [facility] [phone]  If 7pm- 7am, please page neurology on call as listed in [facility].

## 2015-02-08 NOTE — Consult Note (Deleted)
Referring Physician: Alvino Chapel    Chief Complaint:  Odd feeling and fear of stroke  HPI:                                                                                                                                         Cody Willis is an 74 y.o. male who was recently brought to [facility] with cerebellar stroke and acute left vertebral artery dissection with remote left ICA dissection with effective occlusion.  Patient states he has not felt right since his stroke. Last night he felt his head "felt funny like a tingling sensation all over". This AM at 0900 hours he felt his head was light, he started to sweat and he felt off. He came to ED due to these symptoms.  HE denies any focal lateralizing symptoms. He states he has quite smoking and he has been taking his ASA and Plavix daily.   01-13-2015 tests Echo -- Systolic function was normal. The estimated ejection fraction was in the range of 60% to 65%. Wall motion was normal;  CTA head and neck: IMPRESSION: 1. 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 No evidence of hemorrhagic conversion or progressive left cerebellar infarction since brain MRI yesterday; the left PICA is open proximally. 2. Changes of remote left ICA dissection with effective occlusion at the petrous cavernous junction. Left A1 segment is hypoplastic but there is large and patent left posterior communicating artery. 3. Atherosclerosis without flow limiting stenosis. 4. Emphysema.  LDL 61, A1c 5.9  Date last known well: Date: 02/07/2015 Time last known well: Unable to determine tPA Given: No: recent stroke    Past Medical History  Diagnosis Date  . Hiatal hernia   . Esophageal reflux   . Chronic pancreatitis ([place])   . Other B-complex deficiencies   . Essential hypertension, benign   . Pure hypercholesterolemia   . Stroke ([place]) 01/16/2015    Pt. started on plavix    Past Surgical History  Procedure Laterality  Date  . Appendectomy    . Cholecystectomy    . Back surgery      Diskectomy between L4 and L5  . Cataract extraction w/ intraocular lens  implant, bilateral  12/15 and 2/16  . Melanoma excision  ~2009    right upper abdomen    Family History  Problem Relation Age of Onset  . Crohn's disease Sister   . Colitis Brother   . Colon cancer Neg Hx    Social History:  reports that he quit smoking about 3 weeks ago. His smoking use included Cigarettes. He smoked 1.00 pack per day. He has never used smokeless tobacco. He reports that he does not drink alcohol or use illicit drugs.  Allergies:  Allergies  Allergen Reactions  . Sulfamethoxazole-Trimethoprim Rash    Medications:  No current facility-administered medications for this encounter.   Current Outpatient Prescriptions  Medication Sig Dispense Refill  . albuterol (PROVENTIL HFA;VENTOLIN HFA) 108 (90 BASE) MCG/ACT inhaler Inhale 2 puffs into the lungs every 6 (six) hours as needed for wheezing or shortness of breath. 1 Inhaler 2  . aspirin EC 81 MG EC tablet Take 1 tablet (81 mg total) by mouth daily. 30 tablet 0  . clopidogrel (PLAVIX) 75 MG tablet Take 1 tablet (75 mg total) by mouth daily. 30 tablet 11  . cyanocobalamin (,VITAMIN B-12,) 1000 MCG/ML injection Inject 1 mL (1,000 mcg total) into the muscle every 30 (thirty) days. 10 mL 12  . famotidine (PEPCID) 20 MG tablet Take 20 mg by mouth 2 (two) times daily as needed for heartburn or indigestion.    Marland Kitchen loperamide (IMODIUM A-D) 2 MG tablet Take 2 mg by mouth as needed for diarrhea or loose stools.     . lovastatin (MEVACOR) 40 MG tablet Take 1 tablet (40 mg total) by mouth at bedtime. 90 tablet 3  . Pancrelipase, Lip-Prot-Amyl, ([name]) 25000 UNITS CPEP Take 3 capsules by mouth 3 (three) times daily. 270 capsule 1   ROS:                                                                                                                                        History obtained from the patient  General ROS: negative for - chills, fatigue, fever, night sweats, weight gain or weight loss Psychological ROS: negative for - behavioral disorder, hallucinations, memory difficulties, mood swings or suicidal ideation Ophthalmic ROS: negative for - blurry vision, double vision, eye pain or loss of vision ENT ROS: negative for - epistaxis, nasal discharge, oral lesions, sore throat, tinnitus or vertigo Allergy and Immunology ROS: negative for - hives or itchy/watery eyes Hematological and Lymphatic ROS: negative for - bleeding problems, bruising or swollen lymph nodes Endocrine ROS: negative for - galactorrhea, hair pattern changes, polydipsia/polyuria or temperature intolerance Respiratory ROS: negative for - cough, hemoptysis, shortness of breath or wheezing Cardiovascular ROS: negative for - chest pain, dyspnea on exertion, edema or irregular heartbeat Gastrointestinal ROS: negative for - abdominal pain, diarrhea, hematemesis, nausea/vomiting or stool incontinence Genito-Urinary ROS: negative for - dysuria, hematuria, incontinence or urinary frequency/urgency Musculoskeletal ROS: negative for - joint swelling or muscular weakness Neurological ROS: as noted in HPI Dermatological ROS: negative for rash and skin lesion changes  Neurologic Examination:  Blood pressure 115/67, pulse 57, temperature 97.9 F (36.6 C), temperature source Oral, resp. rate 16, height 5\' 7"  (1.702 m), weight 74.844 kg (165 lb), SpO2 94 %.  HEENT-  Normocephalic, no lesions, without obvious abnormality.  Normal external eye and conjunctiva.  Normal TM's bilaterally.  Normal auditory canals and external ears. Normal external nose, mucus membranes and septum.  Normal pharynx. Cardiovascular- S1, S2  normal, pulses palpable throughout   Lungs- chest clear, no wheezing, rales, normal symmetric air entry Abdomen- normal findings: bowel sounds normal Extremities- no edema Lymph-no adenopathy palpable Musculoskeletal-no joint tenderness, deformity or swelling Skin-warm and dry, no hyperpigmentation, vitiligo, or suspicious lesions  Neurological Examination Mental Status: Alert, oriented, thought content appropriate.  Speech fluent without evidence of aphasia.  Able to follow 3 step commands without difficulty. Cranial Nerves: II: Discs flat bilaterally; Visual fields grossly normal, pupils equal, round, reactive to light and accommodation III,IV, VI: ptosis not present, extra-ocular motions intact bilaterally V,VII: smile symmetric, facial light touch sensation normal bilaterally VIII: hearing normal bilaterally IX,X: uvula rises symmetrically XI: bilateral shoulder shrug XII: midline tongue extension Motor: Right : Upper extremity   5/5    Left:     Upper extremity   5/5  Lower extremity   5/5     Lower extremity   5/5 Tone and bulk:normal tone throughout; no atrophy noted Sensory: Pinprick and light touch intact throughout, bilaterally Deep Tendon Reflexes: 2+ and symmetric throughout UE 1+ KJ and no AJ Plantars: Right: downgoing   Left: downgoing Cerebellar: normal finger-to-nose, normal rapid alternating movements and normal heel-to-shin test Gait: not tested due to safety      Lab Results: Basic Metabolic Panel:  Recent Labs Lab 02/08/15 1128  NA 138  K 4.0  CL 103  CO2 28  GLUCOSE 103*  BUN 15  CREATININE 1.13  CALCIUM 9.1    Liver Function Tests:  Recent Labs Lab 02/08/15 1128  AST 23  ALT 19  ALKPHOS 57  BILITOT 0.8  PROT 5.9*  ALBUMIN 3.9   No results for input(s): LIPASE, AMYLASE in the last 168 hours. No results for input(s): AMMONIA in the last 168 hours.  CBC:  Recent Labs Lab 02/08/15 1128  WBC 7.2  NEUTROABS 4.6  HGB 14.2  HCT  42.3  MCV 87.6  PLT 140*    Cardiac Enzymes: No results for input(s): CKTOTAL, CKMB, CKMBINDEX, TROPONINI in the last 168 hours.  Lipid Panel: No results for input(s): CHOL, TRIG, HDL, CHOLHDL, VLDL, LDLCALC in the last 168 hours.  CBG: No results for input(s): GLUCAP in the last 168 hours.  Microbiology: Results for orders placed or performed in visit on 07/28/12  Culture, blood (single)     Status: None   Collection Time: 07/28/12  9:42 PM  Result Value Ref Range Status   Micro Text Report   Final       COMMENT                   NO GROWTH AEROBICALLY/ANAEROBICALLY IN 5 DAYS   ANTIBIOTIC                                                      Culture, blood (single)     Status: None   Collection Time: 07/28/12  9:48 PM  Result Value Ref Range  Status   Micro Text Report   Final       COMMENT                   NO GROWTH AEROBICALLY/ANAEROBICALLY IN 5 DAYS   ANTIBIOTIC                                                        Coagulation Studies: No results for input(s): LABPROT, INR in the last 72 hours.  Imaging: No results found.     Assessment and plan discussed with with attending physician and they are in agreement.    Etta Quill PA-C [facility] [phone]  02/08/2015, 2:20 PM   Assessment: 74 y.o. male   Stroke Risk Factors - hyperlipidemia and hypertension

## 2015-02-08 NOTE — ED Notes (Signed)
Attempted to call MRI x4. Phone line busy.

## 2015-02-08 NOTE — ED Notes (Signed)
Last known normal 1030pm 02/07/15.   Patient states some dizziness starting last night.  Patient had a stroke on 01/16/15.

## 2015-02-08 NOTE — ED Provider Notes (Signed)
CSN: [id number]     Arrival date & time 02/08/15  1042 History   First MD Initiated Contact with Patient 02/08/15 1053     Chief Complaint  Patient presents with  . Dizziness     (Consider location/radiation/quality/duration/timing/severity/associated sxs/prior Treatment) Patient is a 74 y.o. male presenting with dizziness. The history is provided by the patient.  Dizziness Quality:  Vertigo Associated symptoms: no chest pain, no diarrhea, no headaches, no nausea, no shortness of breath, no vomiting and no weakness    patient presents with dizziness. Has been episodes of it. Worse with standing. Seen around 3 weeks ago and diagnosed with cerebellar strokes from a vertebral artery dissection. Is on aspirin and Plavix. He has had some episodes of vertigo since the recent admission but states it was worse last night. States is feeling somewhat better since he arrived to the ER. States he does have a slight feeling of fullness has had. No confusion. He also says at times right hand making a little bit.   Past Medical History  Diagnosis Date  . Hiatal hernia   . Esophageal reflux   . Chronic pancreatitis ([place])   . Other B-complex deficiencies   . Essential hypertension, benign   . Pure hypercholesterolemia   . Stroke ([place]) 01/16/2015    Pt. started on plavix   Past Surgical History  Procedure Laterality Date  . Appendectomy    . Cholecystectomy    . Back surgery      Diskectomy between L4 and L5  . Cataract extraction w/ intraocular lens  implant, bilateral  12/15 and 2/16  . Melanoma excision  ~2009    right upper abdomen   Family History  Problem Relation Age of Onset  . Crohn's disease Sister   . Colitis Brother   . Colon cancer Neg Hx    Social History  Substance Use Topics  . Smoking status: Former Smoker -- 1.00 packs/day    Types: Cigarettes    Quit date: 01/16/2015  . Smokeless tobacco: Never Used     Comment: Counseling sheet given in exam room to quit smoking   .  Alcohol Use: No    Review of Systems  Constitutional: Negative for activity change and appetite change.  Eyes: Negative for pain.  Respiratory: Negative for chest tightness and shortness of breath.   Cardiovascular: Negative for chest pain and leg swelling.  Gastrointestinal: Negative for nausea, vomiting, abdominal pain and diarrhea.  Genitourinary: Negative for flank pain.  Musculoskeletal: Negative for back pain and neck stiffness.  Skin: Negative for rash.  Neurological: Positive for dizziness. Negative for weakness, numbness and headaches.  Psychiatric/Behavioral: Negative for behavioral problems.      Allergies  Sulfamethoxazole-trimethoprim  Home Medications   Prior to Admission medications   Medication Sig Start Date End Date Taking? Authorizing Provider  albuterol (PROVENTIL HFA;VENTOLIN HFA) 108 (90 BASE) MCG/ACT inhaler Inhale 2 puffs into the lungs every 6 (six) hours as needed for wheezing or shortness of breath. 01/18/15  Yes Florencia Reasons, MD  aspirin EC 81 MG EC tablet Take 1 tablet (81 mg total) by mouth daily. 01/18/15  Yes Florencia Reasons, MD  clopidogrel (PLAVIX) 75 MG tablet Take 1 tablet (75 mg total) by mouth daily. 01/21/15  Yes Venia Carbon, MD  cyanocobalamin (,VITAMIN B-12,) 1000 MCG/ML injection Inject 1 mL (1,000 mcg total) into the muscle every 30 (thirty) days. 08/26/13  Yes Jerene Bears, MD  famotidine (PEPCID) 20 MG tablet Take 20 mg by mouth  2 (two) times daily as needed for heartburn or indigestion.   Yes Historical Provider, MD  loperamide (IMODIUM A-D) 2 MG tablet Take 2 mg by mouth as needed for diarrhea or loose stools.    Yes Historical Provider, MD  lovastatin (MEVACOR) 40 MG tablet Take 1 tablet (40 mg total) by mouth at bedtime. 08/17/14  Yes Venia Carbon, MD  Pancrelipase, Lip-Prot-Amyl, ([name]) 25000 UNITS CPEP Take 3 capsules by mouth 3 (three) times daily. 11/17/14  Yes Jerene Bears, MD   BP 135/61 mmHg  Pulse 80  Temp(Src) 97.9 F (36.6 C)  (Oral)  Resp 18  Ht 5\' 7"  (1.702 m)  Wt 165 lb (74.844 kg)  BMI 25.84 kg/m2  SpO2 98% Physical Exam  Constitutional: He appears well-developed.  HENT:  Head: Atraumatic.  Eyes: EOM are normal. Pupils are equal, round, and reactive to light.  Cardiovascular: Normal rate and regular rhythm.   Pulmonary/Chest: Effort normal.  Abdominal: Soft.  Musculoskeletal: Normal range of motion.  Neurological:  Awake and appropriate. No Romberg. Finger-nose intact bilaterally. Extraocular movements intact without nystagmus. Good grip strength and flexion extension in the upper extremity's.  Skin: Skin is warm.    ED Course  Procedures (including critical care time) Labs Review Labs Reviewed  COMPREHENSIVE METABOLIC PANEL - Abnormal; Notable for the following:    Glucose, Bld 103 (*)    Total Protein 5.9 (*)    All other components within normal limits  CBC WITH DIFFERENTIAL/PLATELET - Abnormal; Notable for the following:    Platelets 140 (*)    All other components within normal limits  URINALYSIS, ROUTINE W REFLEX MICROSCOPIC (NOT AT [facility]) - Abnormal; Notable for the following:    Hgb urine dipstick TRACE (*)    All other components within normal limits  URINE MICROSCOPIC-ADD ON    Imaging Review Mr Brain Wo Contrast  02/08/2015  CLINICAL DATA:  74 year old male with vertigo. Vertebral dissection and infarct last month. Currently on anti-platelet therapy. Posterior headache. Initial encounter. EXAM: MRI HEAD WITHOUT CONTRAST TECHNIQUE: Multiplanar, multiecho pulse sequences of the brain and surrounding structures were obtained without intravenous contrast. COMPARISON:  CTA head and neck 01/17/2015.  Brain MRI 01/16/2015. FINDINGS: Stable cerebral volume. Major intracranial vascular flow voids are stable. No restricted diffusion to suggest acute infarction. No midline shift, mass effect, evidence of mass lesion, ventriculomegaly, extra-axial collection or acute intracranial hemorrhage.  Cervicomedullary junction and pituitary are within normal limits. Expected evolution of the small left cerebellar tonsillar infarct demonstrated on the comparison. Subtle encephalomalacia (series 6, image 4). No mass effect or associated blood products. Elsewhere stable gray and white matter signal. Stable visualized internal auditory structures. Mastoids are clear. Stable trace paranasal sinus mucosal thickening. Stable orbit and scalp soft tissues. Normal bone marrow signal. Negative visualized cervical spine. IMPRESSION: Expected evolution of small left PICA cerebellar infarct since 01/16/2015. No new intracranial abnormality. Electronically Signed   By: Genevie Ann M.D.   On: 02/08/2015 17:21   I have personally reviewed and evaluated these images and lab results as part of my medical decision-making.   EKG Interpretation   Date/Time:  Tuesday February 08 2015 10:58:52 EDT Ventricular Rate:  70 PR Interval:  127 QRS Duration: 74 QT Interval:  386 QTC Calculation: 416 R Axis:   81 Text Interpretation:  Sinus rhythm Borderline right axis deviation  Abnormal R-wave progression, early transition Baseline wander in lead(s)  V6 Confirmed by Alvino Chapel  MD, Prentiss Polio [phone]) on 02/08/2015 12:26:55 PM  MDM   Final diagnoses:  Vertigo    Patient with vertigo. May be related to previous stroke. Seen by neurology. Return for new stroke an MRI to be done. If negative likely discharge home. Review after my turnover of the patient shows no new stroke.    Davonna Belling, MD 02/09/15 1640

## 2015-02-08 NOTE — ED Notes (Signed)
Pt. States he started feeling dizziness last night around 1030. The only time he felt nauseous with the dizziness was last night. No vomiting. Pt. Here on 01/16/2015 for stroke. Started on plavix at that time. Pt. Has not taken plavix dose today. No reports of tingling/numbness/problems with mobility. Pt. States 'dizziness not in eyes, but in head" and feeling some pressure in his head.

## 2015-02-08 NOTE — Discharge Instructions (Signed)

## 2015-02-08 NOTE — ED Notes (Signed)
Pt. Given food/drink after passing stroke swallow screen and with EDP approval.

## 2015-02-08 NOTE — ED Notes (Signed)
Neurology at bedside.

## 2015-02-09 ENCOUNTER — Telehealth: Payer: Self-pay | Admitting: *Deleted

## 2015-02-09 NOTE — Telephone Encounter (Signed)
It is just lingering effects from the stroke I suspect that meclizine won't help but it would be okay for him to try it---up to 25mg  three times a day

## 2015-02-09 NOTE — Telephone Encounter (Signed)
Spoke with patient and advised results   

## 2015-02-09 NOTE — Telephone Encounter (Signed)
Spoke with patient and he still has a little bit of dizziness, he was diagnosed with vertigo, per pt they didn't give him anything for vertigo, please advise if he can take meclizine

## 2015-03-15 ENCOUNTER — Encounter: Payer: Self-pay | Admitting: Internal Medicine

## 2015-03-15 ENCOUNTER — Telehealth: Payer: Self-pay

## 2015-03-15 ENCOUNTER — Ambulatory Visit (INDEPENDENT_AMBULATORY_CARE_PROVIDER_SITE_OTHER): Payer: PPO | Admitting: Internal Medicine

## 2015-03-15 VITALS — BP 132/64 | HR 68 | Ht 65.5 in | Wt 174.0 lb

## 2015-03-15 DIAGNOSIS — E538 Deficiency of other specified B group vitamins: Secondary | ICD-10-CM | POA: Diagnosis not present

## 2015-03-15 DIAGNOSIS — K8689 Other specified diseases of pancreas: Secondary | ICD-10-CM | POA: Diagnosis not present

## 2015-03-15 MED ORDER — CYANOCOBALAMIN 1000 MCG/ML IJ SOLN: 1000.0000 ug | INTRAMUSCULAR | Status: DC

## 2015-03-15 MED ORDER — PANCRELIPASE (LIP-PROT-AMYL) 25000 UNITS PO CPEP: ORAL_CAPSULE | ORAL | Status: DC

## 2015-03-15 NOTE — Telephone Encounter (Signed)
Called zenpep in to [facility].  See note attached to rx.

## 2015-03-15 NOTE — Patient Instructions (Signed)
We have sent the following medications to your pharmacy for you to pick up at your convenience: B12- 1 ml every 30 days [name] 25,000-2 capsules three times daily with meals  Please follow up with Dr Hilarie Fredrickson in 1 year.

## 2015-03-15 NOTE — Progress Notes (Signed)
   Subjective:    Patient ID: Cody Willis, male    DOB: May 11, 1940, 74 y.o.   MRN: [id number]  HPI Cody Willis is a 74 year old male with idiopathic chronic pancreatitis, hypertension, hyperlipidemia and B12 deficiency who is here for follow-up. He was last seen in May 2015. Unfortunately he had a stroke in October and was hospitalized. He is now on Plavix. He has follow-up with neurology, Dr. Erlinda Hong, next week. From an abdominal standpoint he is doing well. He is having trouble affording [name]. He's currently taking 25,000 units 3 tablets with meals. No issues with diarrhea or loose stools. Not having to use Imodium. Stools are formed and brown. No melena or rectal bleeding. No issues with heartburn though using Pepcid 20 mg twice a day. No dysphagia or odynophagia. He continues to use his B12 injections. Stable weight and good appetite   Review of Systems As per history of present illness, otherwise negative  Current Medications, Allergies, Past Medical History, Past Surgical History, Family History and Social History were reviewed in [employer] record.     Objective:   Physical Exam BP 132/64 mmHg  Pulse 68  Ht 5' 5.5" (1.664 m)  Wt 174 lb (78.926 kg)  BMI 28.50 kg/m2 Constitutional: Well-developed and well-nourished. No distress. HEENT: Normocephalic and atraumatic. Oropharynx is clear and moist. No oropharyngeal exudate. Conjunctivae are normal.  No scleral icterus. Neck: Neck supple. Trachea midline. Cardiovascular: Normal rate, regular rhythm and intact distal pulses. No M/R/G Pulmonary/chest: Effort normal and breath sounds normal. No wheezing, rales or rhonchi. Abdominal: Soft, nontender, nondistended. Bowel sounds active throughout. There are no masses palpable. No hepatosplenomegaly. Extremities: no clubbing, cyanosis, or edema Lymphadenopathy: No cervical adenopathy noted. Neurological: Alert and oriented to person place and time. Skin: Skin is  warm and dry. No rashes noted. Psychiatric: Normal mood and affect. Behavior is normal.      Assessment & Plan:  74 year old male with idiopathic chronic pancreatitis, hypertension, hyperlipidemia and B12 deficiency who is here for follow-up.   1. Chronic idiopathic pancreatitis with insufficiency -- no abdominal pain or recent acute pancreatitis. Will need continued enzyme replacement. Will try to help by using drug coming representatives to lower his price of [name]. Would like him to take 25,000 units 2-3 capsules with meals daily. No evidence of malabsorption/malnutrition  2. B12 deficiency -- continue B12 monthly  3. CRC screening -- up-to-date repeat colonoscopy for average risk screening February 2021  4. Recent CVA -- recovering nicely without major deficit. Has neurology follow-up.  Return in one year, sooner if necessary

## 2015-03-21 ENCOUNTER — Ambulatory Visit (INDEPENDENT_AMBULATORY_CARE_PROVIDER_SITE_OTHER): Payer: PPO | Admitting: Neurology

## 2015-03-21 ENCOUNTER — Encounter: Payer: Self-pay | Admitting: Neurology

## 2015-03-21 VITALS — BP 111/58 | HR 69 | Ht 66.0 in | Wt 172.4 lb

## 2015-03-21 DIAGNOSIS — I10 Essential (primary) hypertension: Secondary | ICD-10-CM

## 2015-03-21 DIAGNOSIS — Z720 Tobacco use: Secondary | ICD-10-CM | POA: Diagnosis not present

## 2015-03-21 DIAGNOSIS — E785 Hyperlipidemia, unspecified: Secondary | ICD-10-CM | POA: Diagnosis not present

## 2015-03-21 DIAGNOSIS — I63212 Cerebral infarction due to unspecified occlusion or stenosis of left vertebral arteries: Secondary | ICD-10-CM | POA: Diagnosis not present

## 2015-03-21 DIAGNOSIS — F17200 Nicotine dependence, unspecified, uncomplicated: Secondary | ICD-10-CM

## 2015-03-21 DIAGNOSIS — Z8673 Personal history of transient ischemic attack (TIA), and cerebral infarction without residual deficits: Secondary | ICD-10-CM | POA: Insufficient documentation

## 2015-03-21 NOTE — Progress Notes (Signed)
STROKE NEUROLOGY FOLLOW UP NOTE  NAME: Cody Willis DOB: 10/18/1940  REASON FOR VISIT: stroke follow up HISTORY FROM: Patient and chart  Today we had the pleasure of seeing Cody Willis in follow-up at our Neurology Clinic. Pt was accompanied by wife.   History Summary Cody Willis is a 74 y.o. male with history of HTN and HLD and smoker admitted on 01/16/15 for acute vertigo and nausea. He did not receive IV t-PA due to symptoms resovled. MRIshowed 2 L cerebellar infarcts (27mm and punctate) and CTA head and neck left VA high grade stenosis versus occlusion, likely dissection versus atherosclerosis. 2D Echounremarkable and LDL 61 with A1C 5.9. He was put on dual antiplatelet with aspirin and Plavix and continued on lovastatin. Smoking cessation recommended.  Interval History During the interval time, the patient has been doing well.  No stroke like symptoms. He quit smoking since the stroke. Blood pressure at home 110-140 without any blood pressure medication. Denies any snoring during sleep or apnea. Had episode of the vertigo on 02/08/2015, went to ER for inhalation. MRI showed no acute stroke. His symptoms improved and he was sent back home. Blood pressure today 111/58. 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  REVIEW OF SYSTEMS: Full 14 system review of systems performed and notable only for those listed below and in HPI above, all others are negative:  Constitutional:   Cardiovascular:  Ear/Nose/Throat:   Skin:  Eyes:   Respiratory:   Gastroitestinal:   Genitourinary:  Hematology/Lymphatic:   Endocrine:  Musculoskeletal:   Allergy/Immunology:   Neurological:   Psychiatric:  Sleep:   The following represents the patient's updated allergies and side effects list: Allergies  Allergen Reactions  . Sulfamethoxazole-Trimethoprim Rash    The neurologically relevant items on the patient's problem list were  reviewed on today's visit.  Neurologic Examination  A problem focused neurological exam (12 or more points of the single system neurologic examination, vital signs counts as 1 point, cranial nerves count for 8 points) was performed.  Blood pressure 111/58, pulse 69, height 5\' 6"  (1.676 m), weight 172 lb 6.4 oz (78.2 kg).  General - Well nourished, well developed, in no apparent distress.  Ophthalmologic - Sharp disc margins OU.  Cardiovascular - Regular rate and rhythm with no murmur.  Mental Status -  Level of arousal and orientation to time, place, and person were intact. Language including expression, naming, repetition, comprehension was assessed and found intact. Fund of Knowledge was assessed and was intact.  Cranial Nerves II - XII - II - Visual field intact OU. III, IV, VI - Extraocular movements intact. V - Facial sensation intact bilaterally. VII - Facial movement intact bilaterally. VIII - Hearing & vestibular intact bilaterally. X - Palate elevates symmetrically. XI - Chin turning & shoulder shrug intact bilaterally. XII - Tongue protrusion intact.  Motor Strength - The patient's strength was normal in all extremities and pronator drift was absent.  Bulk was normal and fasciculations were absent.   Motor Tone - Muscle tone was assessed at the neck and appendages and was normal.  Reflexes - The patient's reflexes were 1+ in all extremities and he had no pathological reflexes.  Sensory - Light touch, temperature/pinprick were assessed and were normal.    Coordination - The patient had normal movements in the hands and feet with no ataxia or dysmetria.  Tremor was absent.  Gait and Station - The  patient's transfers, posture, gait, station, and turns were observed as normal.  Data reviewed: I personally reviewed the images and agree with the radiology interpretations.  Ct Head Wo Contrast 01/16/2015 1. No acute intracranial abnormalities. 2. Very mild chronic  microvascular ischemic changes in the cerebral white matter  Mr Brain Wo Contrast 01/16/2015 1. Acute nonhemorrhagic 12 mm infarct within the inferior left cerebellum. 2. Second punctate infarct also in the left cerebellum. 3. Abnormal signal in left vertebral artery suggesting slow or occluded flow. 4. Question abnormal signal in the precavernous left internal carotid artery, also suggesting slow flow. 5. Mild subcortical white matter changes, slightly exaggerated for age.   CTA head and neck -  01/17/2015 IMPRESSION: 1. 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 No evidence of hemorrhagic conversion or progressive left cerebellar infarction since brain MRI yesterday; the left PICA is open proximally. 2. Changes of remote left ICA dissection with effective occlusion at the petrous cavernous junction. Left A1 segment is hypoplastic but there is large and patent left posterior communicating artery. 3. Atherosclerosis without flow limiting stenosis. 4. Emphysema. It seems his left ICA and VA are in smaller caliber than right ICA and VA, there is left VA high grade stenosis vs. Occlusion, likely dissection vs. Athro, but left ICA no stenosis but congenitally smaller caliber.  2D echo - - Left ventricle: The cavity size was normal. Wall thickness was normal. Systolic function was normal. The estimated ejection fraction was in the range of 60% to 65%. Wall motion was normal; there were no regional wall motion abnormalities. Left ventricular diastolic function parameters were normal. - Pulmonary arteries: PA peak pressure: 33 mm Hg (S).  Component     Latest Ref Rng 01/17/2015  Cholesterol     0 - 200 mg/dL 116  Triglycerides     <150 mg/dL 88  HDL Cholesterol     >40 mg/dL 37 (L)  Total CHOL/HDL Ratio      3.1  VLDL     0 - 40 mg/dL 18  LDL (calc)     0 - 99 mg/dL 61  Hemoglobin A1C     4.8 - 5.6 % 5.9 (H)  Mean Plasma Glucose       123    Assessment: As you may recall, he is a 74 y.o. Caucasian male with PMH of HTN and HLD and smoker admitted on 01/16/15 for left cerebellar infarcts PICA territory and CTA head and neck left VA high grade stenosis versus occlusion, likely dissection versus atherosclerosis. 2D Echounremarkable and LDL 61 with A1C 5.9. He was put on dual antiplatelet and continued on lovastatin. During the interval time, the patient has been doing well.  Has quit smoking. BP in good control. Had an episode of the vertigo on 02/08/2015, MRI showed no acute stroke. Asking for back to work.  Plan:  - continue ASA and plavix for total 3 months and then plavix alone for stroke prevention - continue lovastatin for stroke prevention - check BP at home - Follow up with your primary care physician for stroke risk factor modification. Recommend maintain blood pressure goal <130/80, diabetes with hemoglobin A1c goal below 6.5% and lipids with LDL cholesterol goal below 70 mg/dL.  - No restriction for driving, but recommend to drive with somebody on board for 2-3 trips initially. If both parties feel comfortable of your driving, you can drive alone after.  - follow up in 4 months  No orders of the defined  types were placed in this encounter.    No orders of the defined types were placed in this encounter.    Patient Instructions  - continue ASA and plavix for another one month and then plavix alone for stroke prevention - continue lovastatin for stroke prevention - check BP at home - Follow up with your primary care physician for stroke risk factor modification. Recommend maintain blood pressure goal <130/80, diabetes with hemoglobin A1c goal below 6.5% and lipids with LDL cholesterol goal below 70 mg/dL.  - No restriction for driving, but recommend to drive with somebody on board for 2-3 trips initially. If both parties feel comfortable of your driving, you can drive alone after.  - follow up in 4  months     Rosalin Hawking, MD PhD [facility] [address] [place], [place] [address] [phone]

## 2015-03-21 NOTE — Patient Instructions (Addendum)
-   continue ASA and plavix for another one month and then plavix alone for stroke prevention - continue lovastatin for stroke prevention - check BP at home - Follow up with your primary care physician for stroke risk factor modification. Recommend maintain blood pressure goal <130/80, diabetes with hemoglobin A1c goal below 6.5% and lipids with LDL cholesterol goal below 70 mg/dL.  - No restriction for driving, but recommend to drive with somebody on board for 2-3 trips initially. If both parties feel comfortable of your driving, you can drive alone after.  - follow up in 4 months

## 2015-05-02 ENCOUNTER — Encounter: Payer: Self-pay | Admitting: Neurology

## 2015-05-02 ENCOUNTER — Ambulatory Visit (INDEPENDENT_AMBULATORY_CARE_PROVIDER_SITE_OTHER): Payer: PPO | Admitting: Neurology

## 2015-05-02 VITALS — BP 133/72 | HR 68 | Ht 66.0 in | Wt 174.6 lb

## 2015-05-02 DIAGNOSIS — I10 Essential (primary) hypertension: Secondary | ICD-10-CM

## 2015-05-02 DIAGNOSIS — Z720 Tobacco use: Secondary | ICD-10-CM | POA: Diagnosis not present

## 2015-05-02 DIAGNOSIS — I63212 Cerebral infarction due to unspecified occlusion or stenosis of left vertebral arteries: Secondary | ICD-10-CM

## 2015-05-02 DIAGNOSIS — E785 Hyperlipidemia, unspecified: Secondary | ICD-10-CM

## 2015-05-02 DIAGNOSIS — M5481 Occipital neuralgia: Secondary | ICD-10-CM | POA: Diagnosis not present

## 2015-05-02 DIAGNOSIS — F17200 Nicotine dependence, unspecified, uncomplicated: Secondary | ICD-10-CM

## 2015-05-02 NOTE — Patient Instructions (Signed)
-   continue plavix and lovastatin for stroke prevention - check BP at home and record. - Follow up with your primary care physician for stroke risk factor. modification. Recommend maintain blood pressure goal <130/80, diabetes with hemoglobin A1c goal below 6.5% and lipids with LDL cholesterol goal below 70 mg/dL.  - you may have right occipital neuralgia and please keep relaxation, some neck exercise to see if it gets better. Will reevaluate in 3 months. - follow up in April as scheduled already.

## 2015-05-02 NOTE — Progress Notes (Signed)
STROKE NEUROLOGY FOLLOW UP NOTE  NAME: Cody Willis DOB: 27-Jan-1941  REASON FOR VISIT: stroke follow up HISTORY FROM: Patient and chart  Today we had the pleasure of seeing Cody Willis in follow-up at our Neurology Clinic. Pt was accompanied by wife.   History Summary Cody Willis is a 75 y.o. male with history of HTN and HLD and smoker admitted on 01/16/15 for acute vertigo and nausea. He did not receive IV t-PA due to symptoms resovled. MRIshowed 2 L cerebellar infarcts (78mm and punctate) and CTA head and neck left VA high grade stenosis versus occlusion, likely dissection versus atherosclerosis. 2D Echounremarkable and LDL 61 with A1C 5.9. He was put on dual antiplatelet with aspirin and Plavix and continued on lovastatin. Smoking cessation recommended.  03/21/15 follow up - the patient has been doing well.  No stroke like symptoms. He quit smoking since the stroke. Blood pressure at home 110-140 without any blood pressure medication. Denies any snoring during sleep or apnea. Had episode of the vertigo on 02/08/2015, went to ER for inhalation. MRI showed no acute stroke. His symptoms improved and he was sent back home. Blood pressure today 111/58. 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  Interval History During the interval time, pt has been doing well from stroke standpoint. No recurrent stroke like symptoms. However, patient complains of intermittent right side scalp numbness and soreness, especially at night sleeping on the right side, relieved when sleeping on the left side. He also complains of neck tenderness intermittently. Patient is anxious regarding whether the symptoms are related to his stroke in the past. He felt sometimes nauseous when about symptoms occur.  REVIEW OF SYSTEMS: Full 14 system review of systems performed and notable only for those listed below and in HPI above, all others are negative:    Constitutional:   Cardiovascular:  Ear/Nose/Throat:   Skin:  Eyes:   Respiratory:  Cough, wheezing Gastroitestinal:  Nausea Genitourinary: Frequency of urination Hematology/Lymphatic:  Bruise and bleed easily Endocrine:  Musculoskeletal:   Allergy/Immunology:   Neurological:  Headache Psychiatric:  Sleep:   The following represents the patient's updated allergies and side effects list: Allergies  Allergen Reactions  . Sulfamethoxazole-Trimethoprim Rash    The neurologically relevant items on the patient's problem list were reviewed on today's visit.  Neurologic Examination  A problem focused neurological exam (12 or more points of the single system neurologic examination, vital signs counts as 1 point, cranial nerves count for 8 points) was performed.  Blood pressure 133/72, pulse 68, height 5\' 6"  (1.676 m), weight 174 lb 9.6 oz (79.198 kg).  General - Well nourished, well developed, in no apparent distress.  Ophthalmologic - Sharp disc margins OU.  Cardiovascular - Regular rate and rhythm with no murmur.  Neck - negative for occipital nerve tenderness bilaterally  Mental Status -  Level of arousal and orientation to time, place, and person were intact. Language including expression, naming, repetition, comprehension was assessed and found intact. Fund of Knowledge was assessed and was intact.  Cranial Nerves II - XII - II - Visual field intact OU. III, IV, VI - Extraocular movements intact. V - Facial sensation intact bilaterally. VII - Facial movement intact bilaterally. VIII - Hearing & vestibular intact bilaterally. X - Palate elevates symmetrically. XI - Chin turning & shoulder shrug intact bilaterally. XII - Tongue protrusion intact.  Motor Strength - The patient's strength was normal in  all extremities and pronator drift was absent.  Bulk was normal and fasciculations were absent.   Motor Tone - Muscle tone was assessed at the neck and appendages and was  normal.  Reflexes - The patient's reflexes were 1+ in all extremities and he had no pathological reflexes.  Sensory - Light touch, temperature/pinprick were assessed and were normal.    Coordination - The patient had normal movements in the hands and feet with no ataxia or dysmetria.  Tremor was absent.  Gait and Station - The patient's transfers, posture, gait, station, and turns were observed as normal.  Data reviewed: I personally reviewed the images and agree with the radiology interpretations.  Ct Head Wo Contrast 01/16/2015 1. No acute intracranial abnormalities. 2. Very mild chronic microvascular ischemic changes in the cerebral white matter  Mr Brain Wo Contrast 01/16/2015 1. Acute nonhemorrhagic 12 mm infarct within the inferior left cerebellum. 2. Second punctate infarct also in the left cerebellum. 3. Abnormal signal in left vertebral artery suggesting slow or occluded flow. 4. Question abnormal signal in the precavernous left internal carotid artery, also suggesting slow flow. 5. Mild subcortical white matter changes, slightly exaggerated for age.   CTA head and neck -  01/17/2015 IMPRESSION: 1. 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 No evidence of hemorrhagic conversion or progressive left cerebellar infarction since brain MRI yesterday; the left PICA is open proximally. 2. Changes of remote left ICA dissection with effective occlusion at the petrous cavernous junction. Left A1 segment is hypoplastic but there is large and patent left posterior communicating artery. 3. Atherosclerosis without flow limiting stenosis. 4. Emphysema. It seems his left ICA and VA are in smaller caliber than right ICA and VA, there is left VA high grade stenosis vs. Occlusion, likely dissection vs. Athro, but left ICA no stenosis but congenitally smaller caliber.  2D echo - - Left ventricle: The cavity size was normal. Wall thickness was normal.  Systolic function was normal. The estimated ejection fraction was in the range of 60% to 65%. Wall motion was normal; there were no regional wall motion abnormalities. Left ventricular diastolic function parameters were normal. - Pulmonary arteries: PA peak pressure: 33 mm Hg (S).  Component     Latest Ref Rng 01/17/2015  Cholesterol     0 - 200 mg/dL 116  Triglycerides     <150 mg/dL 88  HDL Cholesterol     >40 mg/dL 37 (L)  Total CHOL/HDL Ratio      3.1  VLDL     0 - 40 mg/dL 18  LDL (calc)     0 - 99 mg/dL 61  Hemoglobin A1C     4.8 - 5.6 % 5.9 (H)  Mean Plasma Glucose      123    Assessment: As you may recall, he is a 75 y.o. Caucasian male with PMH of HTN and HLD and smoker admitted on 01/16/15 for left cerebellar infarcts PICA territory and CTA head and neck left VA high grade stenosis versus occlusion, likely dissection versus atherosclerosis. 2D Echounremarkable and LDL 61 with A1C 5.9. He was put on dual antiplatelet and continued on lovastatin. During the interval time, the patient has been doing well.  Has quit smoking. BP in good control. Had an episode of the vertigo on 02/08/2015, MRI showed no acute stroke. Has been on DAPT for 3 months and will transition to monotherapy.   He complains of right scalp numbness and soreness, especially sleeping  on the right side, symptoms are consistent with right occipital neuralgia although negative occipital nerve tenderness at this time. This likely related to his neck pain, neck stiffness, anxiety, and tension headache. Patient optioned to have conservative observation at this time. May consider PT, Neurontin or nortriptyline later if symptoms not resolving.  Plan:  - continue plavix and lovastatin for stroke prevention - check BP at home and record. - BP goal 120-150 due to left VA and ICA congenital narrowing. - Follow up with your primary care physician for stroke risk factor. modification. Recommend maintain blood  pressure goal 120-150, diabetes with hemoglobin A1c goal below 6.5% and lipids with LDL cholesterol goal below 70 mg/dL.  - for right occipital neuralgia and please keep relaxation, and neck exercise, and reevaluate in 3 months. - follow up in April as scheduled already.  I spent more than 25 minutes of face to face time with the patient. Greater than 50% of time was spent in counseling and coordination of care. We have discussed about diagnosis and management of occipital neuralgia, BP goal, and relaxation skills.   No orders of the defined types were placed in this encounter.    No orders of the defined types were placed in this encounter.    Patient Instructions  - continue plavix and lovastatin for stroke prevention - check BP at home and record. - Follow up with your primary care physician for stroke risk factor. modification. Recommend maintain blood pressure goal <130/80, diabetes with hemoglobin A1c goal below 6.5% and lipids with LDL cholesterol goal below 70 mg/dL.  - you may have right occipital neuralgia and please keep relaxation, some neck exercise to see if it gets better. Will reevaluate in 3 months. - follow up in April as scheduled already.    Rosalin Hawking, MD PhD [facility] [address], [place], [place] [address] [phone]

## 2015-05-19 ENCOUNTER — Ambulatory Visit (INDEPENDENT_AMBULATORY_CARE_PROVIDER_SITE_OTHER): Payer: PPO | Admitting: Internal Medicine

## 2015-05-19 ENCOUNTER — Encounter: Payer: Self-pay | Admitting: Internal Medicine

## 2015-05-19 VITALS — BP 138/78 | HR 69 | Temp 98.1°F | Wt 176.0 lb

## 2015-05-19 DIAGNOSIS — J441 Chronic obstructive pulmonary disease with (acute) exacerbation: Secondary | ICD-10-CM | POA: Insufficient documentation

## 2015-05-19 MED ORDER — PREDNISONE 20 MG PO TABS: 40.0000 mg | ORAL_TABLET | Freq: Every day | ORAL | Status: DC

## 2015-05-19 MED ORDER — AMOXICILLIN 500 MG PO TABS: 1000.0000 mg | ORAL_TABLET | Freq: Two times a day (BID) | ORAL | Status: DC

## 2015-05-19 NOTE — Progress Notes (Signed)
Pre visit review using our clinic review tool, if applicable. No additional management support is needed unless otherwise documented below in the visit note. 

## 2015-05-19 NOTE — Assessment & Plan Note (Signed)
Mild exacerbation Will give brief course of prednisone Amoxil in case there is an infection ---hard to tell Discussed spacer with inhaler

## 2015-05-19 NOTE — Progress Notes (Signed)
Subjective:    Patient ID: Cody Willis, male    DOB: [birth year], 75 y.o.   MRN: [id number]  HPI Here due to cough Feels like "I have a bad cold" My head is buzzing and "doesn't feel right" Wheezing is bad--keeping wife up at night due to noise Cough-- with lots of mucus (clear)  Started weeks ago--maybe a month Was holding off--but can't now At most mild SOB-- has albuterol and this helps some No fever  Feels his acid reflux is controlled reasonably Takes the famotidine bid regularly  Current Outpatient Prescriptions on File Prior to Visit  Medication Sig Dispense Refill  . albuterol (PROVENTIL HFA;VENTOLIN HFA) 108 (90 BASE) MCG/ACT inhaler Inhale 2 puffs into the lungs every 6 (six) hours as needed for wheezing or shortness of breath. 1 Inhaler 2  . clopidogrel (PLAVIX) 75 MG tablet Take 1 tablet (75 mg total) by mouth daily. 30 tablet 11  . cyanocobalamin (,VITAMIN B-12,) 1000 MCG/ML injection Inject 1 mL (1,000 mcg total) into the muscle every 30 (thirty) days. pharmascy-please provide appropriate needles and syringes 12 mL 0  . famotidine (PEPCID) 20 MG tablet Take 20 mg by mouth 2 (two) times daily as needed for heartburn or indigestion.    Marland Kitchen loperamide (IMODIUM A-D) 2 MG tablet Take 2 mg by mouth as needed for diarrhea or loose stools.     . lovastatin (MEVACOR) 40 MG tablet Take 1 tablet (40 mg total) by mouth at bedtime. 90 tablet 3  . Pancrelipase, Lip-Prot-Amyl, ([name]) 25000 UNITS CPEP Take 4 pills 5 times a day x 90 days - 1800.   Patient has been instructed that this is just the way we order this to use the free voucher.  He should continue to take it the way he normally does - 3 pills 3 times a day 1800 capsule 0   No current facility-administered medications on file prior to visit.    Allergies  Allergen Reactions  . Sulfamethoxazole-Trimethoprim Rash    Past Medical History  Diagnosis Date  . Hiatal hernia   . Esophageal reflux   . Chronic  pancreatitis ([place])   . Other B-complex deficiencies   . Essential hypertension, benign   . Pure hypercholesterolemia   . Stroke ([place]) 01/16/2015    Pt. started on plavix    Past Surgical History  Procedure Laterality Date  . Appendectomy    . Cholecystectomy    . Back surgery      Diskectomy between L4 and L5  . Cataract extraction w/ intraocular lens  implant, bilateral  12/15 and 2/16  . Melanoma excision  ~2009    right upper abdomen    Family History  Problem Relation Age of Onset  . Crohn's disease Sister   . Colitis Brother   . Colon cancer Neg Hx   . Stroke Mother     Social History   Social History  . Marital Status: Married    Spouse Name: N/A  . Number of Children: 2  . Years of Education: N/A   Occupational History  . Truck driver-- local     part time   Social History Main Topics  . Smoking status: Former Smoker -- 1.00 packs/day    Types: Cigarettes    Quit date: 01/16/2015  . Smokeless tobacco: Never Used     Comment: Counseling sheet given in exam room to quit smoking   . Alcohol Use: No  . Drug Use: No  . Sexual Activity: Not on  file   Other Topics Concern  . Not on file   Social History Narrative   No living will   Wife should make decisions for him if needed   Review of Systems Appetite is fine Brief left chest pain--- very brief at times at rest    Objective:   Physical Exam  Constitutional: He appears well-developed and well-nourished. No distress.  HENT:  No sinus tenderness TMs obscured with cerumen Mild nasal congestion  Neck: Normal range of motion. Neck supple.  Pulmonary/Chest: Effort normal. No respiratory distress.  Mild expiratory prolongation and wheezing  Lymphadenopathy:    He has no cervical adenopathy.          Assessment & Plan:

## 2015-05-19 NOTE — Patient Instructions (Addendum)
Please get a spacer for your albuterol inhaler --from the pharmacist. Or you can use an empty toilet paper roll. Call me if you have ongoing problems (don't wait for the April appointment.

## 2015-05-23 DIAGNOSIS — D229 Melanocytic nevi, unspecified: Secondary | ICD-10-CM | POA: Diagnosis not present

## 2015-05-23 DIAGNOSIS — D1800 Hemangioma unspecified site: Secondary | ICD-10-CM | POA: Diagnosis not present

## 2015-05-23 DIAGNOSIS — Z85828 Personal history of other malignant neoplasm of skin: Secondary | ICD-10-CM | POA: Diagnosis not present

## 2015-05-23 DIAGNOSIS — L570 Actinic keratosis: Secondary | ICD-10-CM | POA: Diagnosis not present

## 2015-05-23 DIAGNOSIS — L820 Inflamed seborrheic keratosis: Secondary | ICD-10-CM | POA: Diagnosis not present

## 2015-05-23 DIAGNOSIS — L578 Other skin changes due to chronic exposure to nonionizing radiation: Secondary | ICD-10-CM | POA: Diagnosis not present

## 2015-05-23 DIAGNOSIS — D692 Other nonthrombocytopenic purpura: Secondary | ICD-10-CM | POA: Diagnosis not present

## 2015-05-23 DIAGNOSIS — Z1283 Encounter for screening for malignant neoplasm of skin: Secondary | ICD-10-CM | POA: Diagnosis not present

## 2015-05-23 DIAGNOSIS — L821 Other seborrheic keratosis: Secondary | ICD-10-CM | POA: Diagnosis not present

## 2015-05-23 DIAGNOSIS — Z85820 Personal history of malignant melanoma of skin: Secondary | ICD-10-CM | POA: Diagnosis not present

## 2015-05-23 DIAGNOSIS — L812 Freckles: Secondary | ICD-10-CM | POA: Diagnosis not present

## 2015-06-21 ENCOUNTER — Other Ambulatory Visit: Payer: Self-pay | Admitting: Internal Medicine

## 2015-06-21 NOTE — Telephone Encounter (Signed)
Rx sent electronically.  

## 2015-07-25 ENCOUNTER — Ambulatory Visit (INDEPENDENT_AMBULATORY_CARE_PROVIDER_SITE_OTHER): Payer: PPO | Admitting: Neurology

## 2015-07-25 ENCOUNTER — Encounter: Payer: PPO | Admitting: Internal Medicine

## 2015-07-25 ENCOUNTER — Encounter: Payer: Self-pay | Admitting: Neurology

## 2015-07-25 VITALS — BP 119/68 | HR 72 | Ht 66.0 in | Wt 181.0 lb

## 2015-07-25 DIAGNOSIS — I63342 Cerebral infarction due to thrombosis of left cerebellar artery: Secondary | ICD-10-CM

## 2015-07-25 DIAGNOSIS — M79643 Pain in unspecified hand: Secondary | ICD-10-CM | POA: Diagnosis not present

## 2015-07-25 DIAGNOSIS — I10 Essential (primary) hypertension: Secondary | ICD-10-CM | POA: Diagnosis not present

## 2015-07-25 DIAGNOSIS — E785 Hyperlipidemia, unspecified: Secondary | ICD-10-CM

## 2015-07-25 MED ORDER — GABAPENTIN 100 MG PO CAPS: 100.0000 mg | ORAL_CAPSULE | Freq: Two times a day (BID) | ORAL | Status: DC

## 2015-07-25 MED ORDER — ASPIRIN EC 325 MG PO TBEC: 325.0000 mg | DELAYED_RELEASE_TABLET | Freq: Every day | ORAL | Status: DC

## 2015-07-25 NOTE — Patient Instructions (Addendum)
-   continue lovastatin for stroke prevention - change plavix back to full dose ASA 325mg  daily due to skin itching  - prescribe gabapentin low dose 100mg  twice a day for a week, 100mg  three times a day for hand pain and scalp pain. - check BP at home and record. - BP goal 120-150 due to left VA and ICA congenital narrowing. - Follow up with your primary care physician for stroke risk factor. modification. Recommend maintain blood pressure goal 120-150, diabetes with hemoglobin A1c goal below 6.5% and lipids with LDL cholesterol goal below 70 mg/dL.  - follow up in 3 months.

## 2015-07-25 NOTE — Progress Notes (Signed)
STROKE NEUROLOGY FOLLOW UP NOTE  NAME: Cody Willis DOB: Oct 21, 1940  REASON FOR VISIT: stroke follow up HISTORY FROM: Patient and chart  Today we had the pleasure of seeing DEARL Willis in follow-up at our Neurology Clinic. Pt was accompanied by wife.   History Summary Mr. CONSTANDINOS DEEDS is a 75 y.o. male with history of HTN and HLD and smoker admitted on 01/16/15 for acute vertigo and nausea. He did not receive IV t-PA due to symptoms resovled. MRIshowed 2 L cerebellar infarcts (61mm and punctate) and CTA head and neck left VA high grade stenosis versus occlusion, likely dissection versus atherosclerosis. 2D Echounremarkable and LDL 61 with A1C 5.9. He was put on dual antiplatelet with aspirin and Plavix and continued on lovastatin. Smoking cessation recommended.  03/21/15 follow up - the patient has been doing well.  No stroke like symptoms. He quit smoking since the stroke. Blood pressure at home 110-140 without any blood pressure medication. Denies any snoring during sleep or apnea. Had episode of the vertigo on 02/08/2015, went to ER for inhalation. MRI showed no acute stroke. His symptoms improved and he was sent back home. Blood pressure today 111/58. 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  05/02/15 follow up - pt has been doing well from stroke standpoint. No recurrent stroke like symptoms. However, patient complains of intermittent right side scalp numbness and soreness, especially at night sleeping on the right side, relieved when sleeping on the left side. He also complains of neck tenderness intermittently. Patient is anxious regarding whether the symptoms are related to his stroke in the past. He felt sometimes nauseous when about symptoms occur.  Interval History During the interval time, pt has no stroke like symptoms. Continue to complain of intermittent right scalp tenderness on touch while sleeping in couch to the  right. If he sleeps in couch to the left or in the middle or sleeps in bed, there is no scalp pain. He also complain of bilateral hand hurts at joints, fingers tingling, and bilateral arm itchiness since two months ago. He is on plavix and lovastatin. BP 119/68.  REVIEW OF SYSTEMS: Full 14 system review of systems performed and notable only for those listed below and in HPI above, all others are negative:  Constitutional:   Cardiovascular:  Ear/Nose/Throat:   Skin: itching Eyes:   Respiratory:   Gastroitestinal:   Genitourinary:  Hematology/Lymphatic:  Bruise and bleed easily Endocrine:  Musculoskeletal:  Joint pain Allergy/Immunology:   Neurological:  Headache Psychiatric:  Sleep:   The following represents the patient's updated allergies and side effects list: Allergies  Allergen Reactions  . Plavix [Clopidogrel Bisulfate] Itching  . Sulfamethoxazole-Trimethoprim Rash    The neurologically relevant items on the patient's problem list were reviewed on today's visit.  Neurologic Examination  A problem focused neurological exam (12 or more points of the single system neurologic examination, vital signs counts as 1 point, cranial nerves count for 8 points) was performed.  Blood pressure 119/68, pulse 72, height 5\' 6"  (1.676 m), weight 181 lb (82.101 kg).  General - Well nourished, well developed, in no apparent distress.  Ophthalmologic - Sharp disc margins OU.  Cardiovascular - Regular rate and rhythm with no murmur.  Mental Status -  Level of arousal and orientation to time, place, and person were intact. Language including expression, naming, repetition, comprehension was assessed and found intact. Fund of Knowledge was assessed and was intact.  Cranial Nerves II - XII - II - Visual field intact OU. III, IV, VI - Extraocular movements intact. V - Facial sensation intact bilaterally. VII - Facial movement intact bilaterally. VIII - Hearing & vestibular intact  bilaterally. X - Palate elevates symmetrically. XI - Chin turning & shoulder shrug intact bilaterally. XII - Tongue protrusion intact.  Motor Strength - The patient's strength was normal in all extremities and pronator drift was absent.  Bulk was normal and fasciculations were absent.   Motor Tone - Muscle tone was assessed at the neck and appendages and was normal.  Reflexes - The patient's reflexes were 1+ in all extremities and he had no pathological reflexes.  Sensory - Light touch, temperature/pinprick were assessed and were normal.    Coordination - The patient had normal movements in the hands and feet with no ataxia or dysmetria.  Tremor was absent.  Gait and Station - The patient's transfers, posture, gait, station, and turns were observed as normal.  Data reviewed: I personally reviewed the images and agree with the radiology interpretations.  Ct Head Wo Contrast 01/16/2015 1. No acute intracranial abnormalities. 2. Very mild chronic microvascular ischemic changes in the cerebral white matter  Mr Brain Wo Contrast 01/16/2015 1. Acute nonhemorrhagic 12 mm infarct within the inferior left cerebellum. 2. Second punctate infarct also in the left cerebellum. 3. Abnormal signal in left vertebral artery suggesting slow or occluded flow. 4. Question abnormal signal in the precavernous left internal carotid artery, also suggesting slow flow. 5. Mild subcortical white matter changes, slightly exaggerated for age.   CTA head and neck -  01/17/2015 IMPRESSION: 1. 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 No evidence of hemorrhagic conversion or progressive left cerebellar infarction since brain MRI yesterday; the left PICA is open proximally. 2. Changes of remote left ICA dissection with effective occlusion at the petrous cavernous junction. Left A1 segment is hypoplastic but there is large and patent left posterior communicating artery. 3.  Atherosclerosis without flow limiting stenosis. 4. Emphysema. It seems his left ICA and VA are in smaller caliber than right ICA and VA, there is left VA high grade stenosis vs. Occlusion, likely dissection vs. Athro, but left ICA no stenosis but congenitally smaller caliber.  2D echo - - Left ventricle: The cavity size was normal. Wall thickness was normal. Systolic function was normal. The estimated ejection fraction was in the range of 60% to 65%. Wall motion was normal; there were no regional wall motion abnormalities. Left ventricular diastolic function parameters were normal. - Pulmonary arteries: PA peak pressure: 33 mm Hg (S).  Component     Latest Ref Rng 01/17/2015  Cholesterol     0 - 200 mg/dL 116  Triglycerides     <150 mg/dL 88  HDL Cholesterol     >40 mg/dL 37 (L)  Total CHOL/HDL Ratio      3.1  VLDL     0 - 40 mg/dL 18  LDL (calc)     0 - 99 mg/dL 61  Hemoglobin A1C     4.8 - 5.6 % 5.9 (H)  Mean Plasma Glucose      123    Assessment: As you may recall, he is a 75 y.o. Caucasian male with PMH of HTN and HLD and smoker admitted on 01/16/15 for left cerebellar infarcts PICA territory and CTA head and neck left VA high grade stenosis versus occlusion, likely dissection versus atherosclerosis. 2D Echounremarkable and LDL 61 with  A1C 5.9. He was put on dual antiplatelet and continued on lovastatin. During the interval time, the patient has been doing well.  Has quit smoking. BP in good control. Had an episode of the vertigo on 02/08/2015, MRI showed no acute stroke. completed DAPT for 3 months and transitioned to plavix alone.   He complains of right scalp numbness and soreness, especially sleeping on the right side, symptoms are consistent with right occipital neuralgia although negative occipital nerve tenderness at this time. This likely related to his neck pain, neck stiffness, anxiety, and tension headache. Patient optioned to have conservative observation at  this time. Pt also complains of bilateral arm itchiness, hand pain and finger numbness. Will prescribe low dose gabapentin. Due to the concern of plavix side effect with pruritus, change plavix to ASA.   Plan:  - continue lovastatin for stroke prevention - change plavix to full dose ASA 325mg  daily due to skin itching  - prescribe gabapentin low dose 100mg  bid for a week, and then100mg  tid for hand pain and scalp pain. - check BP at home and record. - BP goal 120-150 due to left VA and ICA congenital narrowing. - Follow up with your primary care physician for stroke risk factor. modification. Recommend maintain blood pressure goal 120-150, diabetes with hemoglobin A1c goal below 6.5% and lipids with LDL cholesterol goal below 70 mg/dL.  - follow up in 3 months.  I spent more than 25 minutes of face to face time with the patient. Greater than 50% of time was spent in counseling and coordination of care. We have discussed about skin itchiness likely due to plavix allergy, pain management, and BP goal.   No orders of the defined types were placed in this encounter.    Meds ordered this encounter  Medications  . gabapentin (NEURONTIN) 100 MG capsule    Sig: Take 1 capsule (100 mg total) by mouth 2 (two) times daily. Take 100mg  bid for 7 days and then 100mg  tid.    Dispense:  90 capsule    Refill:  2  . aspirin EC 325 MG tablet    Sig: Take 1 tablet (325 mg total) by mouth daily.    Dispense:  90 tablet    Refill:  3    Patient Instructions  - continue lovastatin for stroke prevention - change plavix back to full dose ASA 325mg  daily due to skin itching  - prescribe gabapentin low dose 100mg  twice a day for a week, 100mg  three times a day for hand pain and scalp pain. - check BP at home and record. - BP goal 120-150 due to left VA and ICA congenital narrowing. - Follow up with your primary care physician for stroke risk factor. modification. Recommend maintain blood pressure goal  120-150, diabetes with hemoglobin A1c goal below 6.5% and lipids with LDL cholesterol goal below 70 mg/dL.  - follow up in 3 months.    Rosalin Hawking, MD PhD [facility] [address] [place], [place] [address] [phone]

## 2015-07-26 ENCOUNTER — Encounter: Payer: Self-pay | Admitting: Internal Medicine

## 2015-07-26 ENCOUNTER — Ambulatory Visit (INDEPENDENT_AMBULATORY_CARE_PROVIDER_SITE_OTHER)
Admission: RE | Admit: 2015-07-26 | Discharge: 2015-07-26 | Disposition: A | Discharge status: Home / Self Care | Payer: PPO | Source: Ambulatory Visit | Attending: Internal Medicine | Admitting: Internal Medicine

## 2015-07-26 ENCOUNTER — Ambulatory Visit (INDEPENDENT_AMBULATORY_CARE_PROVIDER_SITE_OTHER): Payer: PPO | Admitting: Internal Medicine

## 2015-07-26 VITALS — BP 140/74 | HR 68 | Temp 97.4°F | Ht 66.0 in | Wt 179.0 lb

## 2015-07-26 DIAGNOSIS — M79642 Pain in left hand: Secondary | ICD-10-CM

## 2015-07-26 DIAGNOSIS — I639 Cerebral infarction, unspecified: Secondary | ICD-10-CM | POA: Diagnosis not present

## 2015-07-26 DIAGNOSIS — Z0000 Encounter for general adult medical examination without abnormal findings: Secondary | ICD-10-CM | POA: Diagnosis not present

## 2015-07-26 DIAGNOSIS — J449 Chronic obstructive pulmonary disease, unspecified: Secondary | ICD-10-CM | POA: Diagnosis not present

## 2015-07-26 DIAGNOSIS — I10 Essential (primary) hypertension: Secondary | ICD-10-CM | POA: Diagnosis not present

## 2015-07-26 DIAGNOSIS — Z7189 Other specified counseling: Secondary | ICD-10-CM | POA: Insufficient documentation

## 2015-07-26 LAB — LIPID PANEL
Cholesterol: 141 mg/dL (ref 0–200)
HDL: 35.3 mg/dL — AB (ref >39.00)
NONHDL: 105.35
Total CHOL/HDL Ratio: 4
Triglycerides: 287 mg/dL — ABNORMAL HIGH (ref 0.0–149.0)
VLDL: 57.4 mg/dL — ABNORMAL HIGH (ref 0.0–40.0)

## 2015-07-26 LAB — COMPREHENSIVE METABOLIC PANEL
ALBUMIN: 4.6 g/dL (ref 3.5–5.2)
ALT: 22 U/L (ref 0–53)
AST: 25 U/L (ref 0–37)
Alkaline Phosphatase: 50 U/L (ref 39–117)
BILIRUBIN TOTAL: 0.9 mg/dL (ref 0.2–1.2)
BUN: 18 mg/dL (ref 6–23)
CALCIUM: 9.4 mg/dL (ref 8.4–10.5)
CHLORIDE: 104 meq/L (ref 96–112)
CO2: 29 meq/L (ref 19–32)
CREATININE: 1.1 mg/dL (ref 0.40–1.50)
GFR: 69.52 mL/min (ref >60.00)
Glucose, Bld: 103 mg/dL — ABNORMAL HIGH (ref 70–99)
Potassium: 3.9 mEq/L (ref 3.5–5.1)
SODIUM: 140 meq/L (ref 135–145)
Total Protein: 7 g/dL (ref 6.0–8.3)

## 2015-07-26 LAB — CBC WITH DIFFERENTIAL/PLATELET
BASOS PCT: 0.5 % (ref 0.0–3.0)
Basophils Absolute: 0.1 10*3/uL (ref 0.0–0.1)
Eosinophils Absolute: 0.2 10*3/uL (ref 0.0–0.7)
Eosinophils Relative: 1.7 % (ref 0.0–5.0)
HEMATOCRIT: 44 % (ref 39.0–52.0)
Hemoglobin: 15.1 g/dL (ref 13.0–17.0)
LYMPHS PCT: 16 % (ref 12.0–46.0)
Lymphs Abs: 1.5 10*3/uL (ref 0.7–4.0)
MCHC: 34.3 g/dL (ref 30.0–36.0)
MCV: 89 fl (ref 78.0–100.0)
Monocytes Absolute: 0.9 10*3/uL (ref 0.1–1.0)
Monocytes Relative: 9.7 % (ref 3.0–12.0)
NEUTROS ABS: 6.9 10*3/uL (ref 1.4–7.7)
Neutrophils Relative %: 72.1 % (ref 43.0–77.0)
PLATELETS: 180 10*3/uL (ref 150.0–400.0)
RBC: 4.95 Mil/uL (ref 4.22–5.81)
RDW: 14.5 % (ref 11.5–15.5)
WBC: 9.6 10*3/uL (ref 4.0–10.5)

## 2015-07-26 LAB — SEDIMENTATION RATE: Sed Rate: 5 mm/hr (ref 0–22)

## 2015-07-26 LAB — LDL CHOLESTEROL, DIRECT: Direct LDL: 75 mg/dL

## 2015-07-26 NOTE — Assessment & Plan Note (Signed)
I have personally reviewed the Medicare Annual Wellness questionnaire and have noted 1. The patient's medical and social history 2. Their use of alcohol, tobacco or illicit drugs 3. Their current medications and supplements 4. The patient's functional ability including ADL's, fall risks, home safety risks and hearing or visual             impairment. 5. Diet and physical activities 6. Evidence for depression or mood disorders  The patients weight, height, BMI and visual acuity have been recorded in the chart I have made referrals, counseling and provided education to the patient based review of the above and I have provided the pt with a written personalized care plan for preventive services.  I have provided you with a copy of your personalized plan for preventive services. Please take the time to review along with your updated medication list.  Prefers no flu shot or zostavax No PSA due to age Normal colon 2011--that is probably it Discussed healthy lifestyle

## 2015-07-26 NOTE — Progress Notes (Signed)
Pre visit review using our clinic review tool, if applicable. No additional management support is needed unless otherwise documented below in the visit note. 

## 2015-07-26 NOTE — Assessment & Plan Note (Signed)
Slightly decreased breath sounds and faint exp wheeze--but not tight Will continue evening albuterol and avoid other meds for now

## 2015-07-26 NOTE — Patient Instructions (Signed)
DASH Eating Plan  DASH stands for "Dietary Approaches to Stop Hypertension." The DASH eating plan is a healthy eating plan that has been shown to reduce high blood pressure (hypertension). Additional health benefits may include reducing the risk of type 2 diabetes mellitus, heart disease, and stroke. The DASH eating plan may also help with weight loss.  WHAT DO I NEED TO KNOW ABOUT THE DASH EATING PLAN?  For the DASH eating plan, you will follow these general guidelines:  · Choose foods with a percent daily value for sodium of less than 5% (as listed on the food label).  · Use salt-free seasonings or herbs instead of table salt or sea salt.  · Check with your health care provider or pharmacist before using salt substitutes.  · Eat lower-sodium products, often labeled as "lower sodium" or "no salt added."  · Eat fresh foods.  · Eat more vegetables, fruits, and low-fat dairy products.  · Choose whole grains. Look for the word "whole" as the first word in the ingredient list.  · Choose fish and skinless chicken or turkey more often than red meat. Limit fish, poultry, and meat to 6 oz (170 g) each day.  · Limit sweets, desserts, sugars, and sugary drinks.  · Choose heart-healthy fats.  · Limit cheese to 1 oz (28 g) per day.  · Eat more home-cooked food and less restaurant, buffet, and fast food.  · Limit fried foods.  · Cook foods using methods other than frying.  · Limit canned vegetables. If you do use them, rinse them well to decrease the sodium.  · When eating at a restaurant, ask that your food be prepared with less salt, or no salt if possible.  WHAT FOODS CAN I EAT?  Seek help from a dietitian for individual calorie needs.  Grains  Whole grain or whole wheat bread. Brown rice. Whole grain or whole wheat pasta. Quinoa, bulgur, and whole grain cereals. Low-sodium cereals. Corn or whole wheat flour tortillas. Whole grain cornbread. Whole grain crackers. Low-sodium crackers.  Vegetables  Fresh or frozen vegetables  (raw, steamed, roasted, or grilled). Low-sodium or reduced-sodium tomato and vegetable juices. Low-sodium or reduced-sodium tomato sauce and paste. Low-sodium or reduced-sodium canned vegetables.   Fruits  All fresh, canned (in natural juice), or frozen fruits.  Meat and Other Protein Products  Ground beef (85% or leaner), grass-fed beef, or beef trimmed of fat. Skinless chicken or turkey. Ground chicken or turkey. Pork trimmed of fat. All fish and seafood. Eggs. Dried beans, peas, or lentils. Unsalted nuts and seeds. Unsalted canned beans.  Dairy  Low-fat dairy products, such as skim or 1% milk, 2% or reduced-fat cheeses, low-fat ricotta or cottage cheese, or plain low-fat yogurt. Low-sodium or reduced-sodium cheeses.  Fats and Oils  Tub margarines without trans fats. Light or reduced-fat mayonnaise and salad dressings (reduced sodium). Avocado. Safflower, olive, or canola oils. Natural peanut or almond butter.  Other  Unsalted popcorn and pretzels.  The items listed above may not be a complete list of recommended foods or beverages. Contact your dietitian for more options.  WHAT FOODS ARE NOT RECOMMENDED?  Grains  White bread. White pasta. White rice. Refined cornbread. Bagels and croissants. Crackers that contain trans fat.  Vegetables  Creamed or fried vegetables. Vegetables in a cheese sauce. Regular canned vegetables. Regular canned tomato sauce and paste. Regular tomato and vegetable juices.  Fruits  Dried fruits. Canned fruit in light or heavy syrup. Fruit juice.  Meat and Other Protein   Products  Fatty cuts of meat. Ribs, chicken wings, bacon, sausage, bologna, salami, chitterlings, fatback, hot dogs, bratwurst, and packaged luncheon meats. Salted nuts and seeds. Canned beans with salt.  Dairy  Whole or 2% milk, cream, half-and-half, and cream cheese. Whole-fat or sweetened yogurt. Full-fat cheeses or blue cheese. Nondairy creamers and whipped toppings. Processed cheese, cheese spreads, or cheese  curds.  Condiments  Onion and garlic salt, seasoned salt, table salt, and sea salt. Canned and packaged gravies. Worcestershire sauce. Tartar sauce. Barbecue sauce. Teriyaki sauce. Soy sauce, including reduced sodium. Steak sauce. Fish sauce. Oyster sauce. Cocktail sauce. Horseradish. Ketchup and mustard. Meat flavorings and tenderizers. Bouillon cubes. Hot sauce. Tabasco sauce. Marinades. Taco seasonings. Relishes.  Fats and Oils  Butter, stick margarine, lard, shortening, ghee, and bacon fat. Coconut, palm kernel, or palm oils. Regular salad dressings.  Other  Pickles and olives. Salted popcorn and pretzels.  The items listed above may not be a complete list of foods and beverages to avoid. Contact your dietitian for more information.  WHERE CAN I FIND MORE INFORMATION?  National Heart, Lung, and Blood Institute: www.nhlbi.nih.gov/health/health-topics/topics/dash/     This information is not intended to replace advice given to you by your health care provider. Make sure you discuss any questions you have with your health care provider.     Document Released: 03/15/2011 Document Revised: 04/16/2014 Document Reviewed: 01/28/2013  Elsevier Interactive Patient Education ©2016 [employer] Inc.

## 2015-07-26 NOTE — Assessment & Plan Note (Signed)
See social history Blank forms given 

## 2015-07-26 NOTE — Assessment & Plan Note (Signed)
No active synovitis Will check labs and x-ray

## 2015-07-26 NOTE — Assessment & Plan Note (Signed)
Mostly resolved symptoms Will continue aspirin now and statin

## 2015-07-26 NOTE — Assessment & Plan Note (Signed)
BP Readings from Last 3 Encounters:  07/26/15 140/74  07/25/15 119/68  05/19/15 138/78  reasonable control

## 2015-07-26 NOTE — Progress Notes (Signed)
Subjective:    Patient ID: Cody Willis, male    DOB: 1940-11-13, 75 y.o.   MRN: [id number]  HPI Here for Medicare wellness and follow up of chronic health conditions Reviewed form and advanced directives Reviewed other doctors No longer uses tobacco No alcohol Vision is okay Hearing is fair--can have mild problems with distracting sounds [place] with stroke--no injury No depression or anhedonia Independent with instrumental ADLs Feels his memory is "slower" since stroke--- takes a while to think of things  Having pain in both hands Feels it is in the joints No swelling--just aching Goes back 2 months---will wake him up at night  Feels sore and tight--worse after using hands (like trimming bushes) Tried brother's topical medication--no help. aspercreme gives some relief  Was given gabapentin for pain in head---tried it but he couldn't tolerate Right side aching after waking up in recliner Not a problem at bedtime  No residual problems from stroke May have a "numb" type feeling in AM--then just goes away No balance or walking problems Changed from plavix to just ASA  Cough is better Still gets cough at times--especially in bed Uses the albuterol --1 puff just at night and hasn't needed in day Breathing is okay---has stable DOE if he does a lot  Slight chest pain--left and right side. Seems to be positional No palpitations No dizziness or syncope Weight is up since stroke and quitting smoking--??trace edema in ankles  Current Outpatient Prescriptions on File Prior to Visit  Medication Sig Dispense Refill  . albuterol (PROVENTIL HFA;VENTOLIN HFA) 108 (90 BASE) MCG/ACT inhaler Inhale 2 puffs into the lungs every 6 (six) hours as needed for wheezing or shortness of breath. 1 Inhaler 2  . aspirin EC 325 MG tablet Take 1 tablet (325 mg total) by mouth daily. 90 tablet 3  . cyanocobalamin (,VITAMIN B-12,) 1000 MCG/ML injection Inject 1 mL (1,000 mcg total) into the muscle  every 30 (thirty) days. pharmascy-please provide appropriate needles and syringes 12 mL 0  . famotidine (PEPCID) 20 MG tablet Take 20 mg by mouth 2 (two) times daily as needed for heartburn or indigestion.    . gabapentin (NEURONTIN) 100 MG capsule Take 1 capsule (100 mg total) by mouth 2 (two) times daily. Take 100mg  bid for 7 days and then 100mg  tid. 90 capsule 2  . loperamide (IMODIUM A-D) 2 MG tablet Take 2 mg by mouth as needed for diarrhea or loose stools.     . lovastatin (MEVACOR) 40 MG tablet TAKE 1 TABLET BY MOUTH EVERY NIGHT AT BEDTIME 90 tablet 0  . Pancrelipase, Lip-Prot-Amyl, ([name]) 25000 UNITS CPEP Take 4 pills 5 times a day x 90 days - 1800.   Patient has been instructed that this is just the way we order this to use the free voucher.  He should continue to take it the way he normally does - 3 pills 3 times a day 1800 capsule 0   No current facility-administered medications on file prior to visit.    Allergies  Allergen Reactions  . Plavix [Clopidogrel Bisulfate] Itching  . Sulfamethoxazole-Trimethoprim Rash    Past Medical History  Diagnosis Date  . Hiatal hernia   . Esophageal reflux   . Chronic pancreatitis ([place])   . Other B-complex deficiencies   . Essential hypertension, benign   . Pure hypercholesterolemia   . Stroke ([place]) 01/16/2015    Pt. started on plavix    Past Surgical History  Procedure Laterality Date  . Appendectomy    .  Cholecystectomy    . Back surgery      Diskectomy between L4 and L5  . Cataract extraction w/ intraocular lens  implant, bilateral  12/15 and 2/16  . Melanoma excision  ~2009    right upper abdomen    Family History  Problem Relation Age of Onset  . Crohn's disease Sister   . Colitis Brother   . Colon cancer Neg Hx   . Stroke Mother     Social History   Social History  . Marital Status: Married    Spouse Name: N/A  . Number of Children: 2  . Years of Education: N/A   Occupational History  . Truck driver-- local       part time   Social History Main Topics  . Smoking status: Former Smoker -- 1.00 packs/day    Types: Cigarettes    Quit date: 01/16/2015  . Smokeless tobacco: Never Used     Comment: Counseling sheet given in exam room to quit smoking   . Alcohol Use: No  . Drug Use: No  . Sexual Activity: Not on file   Other Topics Concern  . Not on file   Social History Narrative   No living will   Wife should make decisions for him if needed   Would accept resuscitation attempts   Not sure about tube feeds   Review of Systems Wears seat belt Appetite is good Weight is up Generally sleeps okay Full dentures --- no problems No other joint issues other than hands No rash or suspicious lesions Bruises easy Bowels are okay--- no blood. Voids okay. Stable nocturia x 2    Objective:   Physical Exam  Constitutional: He is oriented to person, place, and time. He appears well-developed and well-nourished. No distress.  HENT:  Mouth/Throat: Oropharynx is clear and moist. No oropharyngeal exudate.  Full dentures  Neck: Normal range of motion. Neck supple.  Cardiovascular: Normal rate, regular rhythm, normal heart sounds and intact distal pulses.  Exam reveals no gallop.   No murmur heard. Pulmonary/Chest: Effort normal and breath sounds normal. No respiratory distress. He has no wheezes. He has no rales.  Abdominal: Soft. There is no tenderness.  Musculoskeletal: He exhibits no edema.  Mild tenderness at left 2nd MCP No active synovitis  Lymphadenopathy:    He has no cervical adenopathy.  Neurological: He is alert and oriented to person, place, and time.  President-- "Trump, Obama, Bush" 100-93- "not good at math" D-l-r-o-w Recall 1/3  Skin: No rash noted. No erythema.  Psychiatric: He has a normal mood and affect. His behavior is normal.          Assessment & Plan:

## 2015-07-27 LAB — ANA: Anti Nuclear Antibody(ANA): NEGATIVE

## 2015-07-27 LAB — RHEUMATOID FACTOR: Rhuematoid fact SerPl-aCnc: 16 IU/mL — ABNORMAL HIGH (ref <=14)

## 2015-09-01 ENCOUNTER — Other Ambulatory Visit: Payer: Self-pay | Admitting: Internal Medicine

## 2015-09-14 ENCOUNTER — Telehealth: Payer: Self-pay | Admitting: Internal Medicine

## 2015-09-14 NOTE — Telephone Encounter (Signed)
Pt scheduled to see Dr. Hilarie Fredrickson 09/19/15@8 :30am. Pt aware of appt.

## 2015-09-19 ENCOUNTER — Ambulatory Visit (INDEPENDENT_AMBULATORY_CARE_PROVIDER_SITE_OTHER): Payer: PPO | Admitting: Internal Medicine

## 2015-09-19 ENCOUNTER — Encounter: Payer: Self-pay | Admitting: Internal Medicine

## 2015-09-19 ENCOUNTER — Telehealth: Payer: Self-pay | Admitting: Internal Medicine

## 2015-09-19 VITALS — BP 132/74 | HR 68 | Ht 65.5 in | Wt 179.5 lb

## 2015-09-19 DIAGNOSIS — R1310 Dysphagia, unspecified: Secondary | ICD-10-CM

## 2015-09-19 DIAGNOSIS — R1013 Epigastric pain: Secondary | ICD-10-CM | POA: Diagnosis not present

## 2015-09-19 DIAGNOSIS — K219 Gastro-esophageal reflux disease without esophagitis: Secondary | ICD-10-CM

## 2015-09-19 MED ORDER — PANTOPRAZOLE SODIUM 40 MG PO TBEC: 40.0000 mg | DELAYED_RELEASE_TABLET | ORAL | Status: DC

## 2015-09-19 NOTE — Patient Instructions (Signed)
You have been scheduled for an endoscopy. Please follow written instructions given to you at your visit today. If you use inhalers (even only as needed), please bring them with you on the day of your procedure. Your physician has requested that you go to www.startemmi.com and enter the access code given to you at your visit today. This web site gives a general overview about your procedure. However, you should still follow specific instructions given to you by our office regarding your preparation for the procedure.  We have sent the following medications to your pharmacy for you to pick up at your convenience: Pantoprazole 40 mg every morning  Take your famotidine 20 mg every evening.  If you are age 78 or older, your body mass index should be between 23-30. Your Body mass index is 29.41 kg/(m^2). If this is out of the aforementioned range listed, please consider follow up with your Primary Care Provider.  If you are age 71 or younger, your body mass index should be between 19-25. Your Body mass index is 29.41 kg/(m^2). If this is out of the aformentioned range listed, please consider follow up with your Primary Care Provider.

## 2015-09-19 NOTE — Telephone Encounter (Signed)
Rx sent 

## 2015-09-19 NOTE — Progress Notes (Signed)
   Subjective:    Patient ID: FLAVIAN MCILWAIN, male    DOB: [birth year], 75 y.o.   MRN: [id number]  HPI Nalan Capizzi is a 75 year old male with history of chronic idiopathic pancreatitis on enzyme replacement, GERD, B12 deficiency, hyperlipidemia, hypertension, history of CVA who is here for follow-up. He was last seen in December 2016. He reports that he has developed increased regurgitation symptoms and belching. He also feels like his food is not "going down fully". He feels as if it is stopping in the top of his chest or throat. He's also occasionally having epigastric abdominal pain which is worse with eating a full or large meal. He denies nausea or vomiting. Denies weight loss. Does report cough off and on at times productive of a clear phlegm. No odynophagia. No fever or chills. No chest pain. No change in bowel habit. No blood in his stool or melena. He is using [name] 3 pills with meals. He is also currently using famotidine 20 mg 1 or 2 times a day but more on an as-needed basis. He occasionally takes Imodium if his stools are loose but this is rare. His Plavix is been stopped by neurology and he takes aspirin 325 mg daily   Review of Systems As per history of present illness, otherwise negative  Current Medications, Allergies, Past Medical History, Past Surgical History, Family History and Social History were reviewed in [employer] record.     Objective:   Physical Exam BP 132/74 mmHg  Pulse 68  Ht 5' 5.5" (1.664 m)  Wt 179 lb 8 oz (81.421 kg)  BMI 29.41 kg/m2 Constitutional: Well-developed and well-nourished. No distress. HEENT: Normocephalic and atraumatic. Oropharynx is clear and moist. No oropharyngeal exudate. Conjunctivae are normal.  No scleral icterus. Neck: Neck supple. Trachea midline. Cardiovascular: Normal rate, regular rhythm and intact distal pulses. No M/R/G Pulmonary/chest: Effort normal and breath sounds normal. No wheezing, rales or  rhonchi. Abdominal: Soft, Mild epigastric tenderness without rebound or guarding, nondistended. Bowel sounds active throughout. There are no masses palpable. Extremities: no clubbing, cyanosis, or edema Lymphadenopathy: No cervical adenopathy noted. Neurological: Alert and oriented to person place and time. Skin: Skin is warm and dry.  Psychiatric: Normal mood and affect. Behavior is normal.     Assessment & Plan:  75 year old male with history of chronic idiopathic pancreatitis on enzyme replacement, GERD, B12 deficiency, hyperlipidemia, hypertension, history of CVA who is here for follow-up.  1. GERD/dysphagia/epigastric pain -- I have recommended that we start pantoprazole 40 mg daily, 30 minutes before breakfast to control symptoms which are consistent with gastroesophageal reflux disease. May have an element of gastritis given epigastric pain. Given overall symptoms and dysphagia, I recommended upper endoscopy for possible dilation. We discussed the risk benefits and alternatives, and he is agreeable to proceed. He can continue to use famotidine 20 mg in the evening for breakthrough symptoms if needed.  2. Chronic idiopathic pancreatitis with insufficiency -- continue [name] 3 capsules with meals 3 times daily. No evidence of malabsorption or malnutrition. No diarrhea.  25 minutes spent with the patient today. Greater than 50% was spent in counseling and coordination of care with the patient

## 2015-09-27 ENCOUNTER — Encounter: Payer: Self-pay | Admitting: Internal Medicine

## 2015-10-06 ENCOUNTER — Ambulatory Visit (AMBULATORY_SURGERY_CENTER): Payer: PPO | Admitting: Internal Medicine

## 2015-10-06 ENCOUNTER — Encounter: Payer: Self-pay | Admitting: Internal Medicine

## 2015-10-06 VITALS — BP 129/68 | HR 56 | Temp 98.0°F | Resp 15 | Ht 65.0 in | Wt 179.0 lb

## 2015-10-06 DIAGNOSIS — Z8673 Personal history of transient ischemic attack (TIA), and cerebral infarction without residual deficits: Secondary | ICD-10-CM | POA: Diagnosis not present

## 2015-10-06 DIAGNOSIS — D132 Benign neoplasm of duodenum: Secondary | ICD-10-CM | POA: Diagnosis not present

## 2015-10-06 DIAGNOSIS — D131 Benign neoplasm of stomach: Secondary | ICD-10-CM

## 2015-10-06 DIAGNOSIS — E669 Obesity, unspecified: Secondary | ICD-10-CM | POA: Diagnosis not present

## 2015-10-06 DIAGNOSIS — K3189 Other diseases of stomach and duodenum: Secondary | ICD-10-CM | POA: Diagnosis not present

## 2015-10-06 DIAGNOSIS — K219 Gastro-esophageal reflux disease without esophagitis: Secondary | ICD-10-CM | POA: Diagnosis not present

## 2015-10-06 DIAGNOSIS — R1310 Dysphagia, unspecified: Secondary | ICD-10-CM

## 2015-10-06 DIAGNOSIS — J449 Chronic obstructive pulmonary disease, unspecified: Secondary | ICD-10-CM | POA: Diagnosis not present

## 2015-10-06 MED ORDER — SODIUM CHLORIDE 0.9 % IV SOLN: 500.0000 mL | INTRAVENOUS | Status: DC

## 2015-10-06 NOTE — Patient Instructions (Addendum)
YOU HAD AN ENDOSCOPIC PROCEDURE TODAY AT [facility]:   Refer to the procedure report that was given to you for any specific questions about what was found during the examination.  If the procedure report does not answer your questions, please call your gastroenterologist to clarify.  If you requested that your care partner not be given the details of your procedure findings, then the procedure report has been included in a sealed envelope for you to review at your convenience later.  YOU SHOULD EXPECT: Some feelings of bloating in the abdomen. Passage of more gas than usual.  Walking can help get rid of the air that was put into your GI tract during the procedure and reduce the bloating. If you had a lower endoscopy (such as a colonoscopy or flexible sigmoidoscopy) you may notice spotting of blood in your stool or on the toilet paper. If you underwent a bowel prep for your procedure, you may not have a normal bowel movement for a few days.  Please Note:  You might notice some irritation and congestion in your nose or some drainage.  This is from the oxygen used during your procedure.  There is no need for concern and it should clear up in a day or so.  SYMPTOMS TO REPORT IMMEDIATELY:    Following upper endoscopy (EGD)  Vomiting of blood or coffee ground material  New chest pain or pain under the shoulder blades  Painful or persistently difficult swallowing  New shortness of breath  Fever of 100F or higher  Black, tarry-looking stools  For urgent or emergent issues, a gastroenterologist can be reached at any hour by calling [phone].   DIET:  Follow Dilation Handout.   ACTIVITY:  You should plan to take it easy for the rest of today and you should NOT DRIVE or use heavy machinery until tomorrow (because of the sedation medicines used during the test).    FOLLOW UP: Our staff will call the number listed on your records the next business day following your procedure  to check on you and address any questions or concerns that you may have regarding the information given to you following your procedure. If we do not reach you, we will leave a message.  However, if you are feeling well and you are not experiencing any problems, there is no need to return our call.  We will assume that you have returned to your regular daily activities without incident.  If any biopsies were taken you will be contacted by phone or by letter within the next 1-3 weeks.  Please call us at [phone] if you have not heard about the biopsies in 3 weeks.    SIGNATURES/CONFIDENTIALITY: You and/or your care partner have signed paperwork which will be entered into your electronic medical record.  These signatures attest to the fact that that the information above on your After Visit Summary has been reviewed and is understood.  Full responsibility of the confidentiality of this discharge information lies with you and/or your care-partner.   Resume medications. Information given on Dilation Diet and Esophagitis.

## 2015-10-06 NOTE — Progress Notes (Signed)
Called to room to assist during endoscopic procedure.  Patient ID and intended procedure confirmed with present staff. Received instructions for my participation in the procedure from the performing physician.  

## 2015-10-06 NOTE — Op Note (Signed)
[place] Patient Name: Cody Willis Procedure Date: 10/06/2015 9:26 AM MRN: [id number] Endoscopist: Jerene Bears , MD Age: 75 Referring MD:  Date of Birth: Jan 20, 1941 Gender: Male Account #: [id number] Procedure:                Upper GI endoscopy Indications:              Epigastric abdominal pain, Dysphagia, Suspected                            gastro-esophageal reflux disease Medicines:                Monitored Anesthesia Care Procedure:                Pre-Anesthesia Assessment:                           - Prior to the procedure, a History and Physical                            was performed, and patient medications and                            allergies were reviewed. The patient's tolerance of                            previous anesthesia was also reviewed. The risks                            and benefits of the procedure and the sedation                            options and risks were discussed with the patient.                            All questions were answered, and informed consent                            was obtained. Prior Anticoagulants: The patient has                            taken no previous anticoagulant or antiplatelet                            agents. ASA Grade Assessment: II - A patient with                            mild systemic disease. After reviewing the risks                            and benefits, the patient was deemed in                            satisfactory condition to undergo the procedure.  After obtaining informed consent, the endoscope was                            passed under direct vision. Throughout the                            procedure, the patient's blood pressure, pulse, and                            oxygen saturations were monitored continuously. The                            Model GIF-HQ190 [id number]) scope was introduced                            through the mouth, and  advanced to the second part                            of duodenum. The upper GI endoscopy was                            accomplished without difficulty. The patient                            tolerated the procedure well. Scope In: Scope Out: Findings:                 LA Grade A (one or more mucosal breaks less than 5                            mm, not extending between tops of 2 mucosal folds)                            esophagitis with no bleeding was found at the                            gastroesophageal junction.                           No endoscopic abnormality was evident in the                            esophagus to explain the patient's complaint of                            dysphagia. It was decided, however, to proceed with                            dilation of the entire esophagus. A guidewire was                            placed and the scope was withdrawn. Dilation was  performed with a Savary dilator with mild                            resistance at 17 mm.                           A single 12 mm sessile polypoid lesion with no                            bleeding was found in the prepyloric region of the                            stomach. This was biopsied with a cold forceps for                            histology.                           The exam of the stomach was otherwise normal.                           The cardia and gastric fundus were normal on                            retroflexion.                           A single 15 mm sessile polyp was found in the                            duodenal bulb. This was biopsied with a cold                            forceps for histology. Complications:            No immediate complications. Estimated Blood Loss:     Estimated blood loss was minimal. Impression:               - LA Grade A esophagitis.                           - No endoscopic esophageal abnormality to explain                             patient's dysphagia. Esophagus dilated to 17 mm                            with Savary over a guidewire.                           - A single gastric polypoid lesion. Biopsied.                           - A single duodenal polyp. Biopsied. Recommendation:           - Patient has a contact number available  for                            emergencies. The signs and symptoms of potential                            delayed complications were discussed with the                            patient. Return to normal activities tomorrow.                            Written discharge instructions were provided to the                            patient.                           - Resume previous diet.                           - Continue present medications, including                            pantoprazole 40 mg daily.                           - Await pathology results.                           - Repeat upper endoscopy depending on pathology                            results. Jerene Bears, MD 10/06/2015 9:59:54 AM This report has been signed electronically.

## 2015-10-06 NOTE — Progress Notes (Signed)
Report to PACU, RN, vss, BBS= Clear.  

## 2015-10-07 ENCOUNTER — Telehealth: Payer: Self-pay

## 2015-10-07 NOTE — Telephone Encounter (Signed)
  Follow up Call-  Call back number 10/06/2015  Post procedure Call Back phone  # [phone]  Permission to leave phone message Yes     Patient questions:  Do you have a fever, pain , or abdominal swelling? No. Pain Score  0 *  Have you tolerated food without any problems? Yes.    Have you been able to return to your normal activities? Yes.    Do you have any questions about your discharge instructions: Diet   No. Medications  No. Follow up visit  No.  Do you have questions or concerns about your Care? No.  Actions: * If pain score is 4 or above: No action needed, pain <4.

## 2015-10-21 ENCOUNTER — Telehealth: Payer: Self-pay | Admitting: Internal Medicine

## 2015-10-21 NOTE — Telephone Encounter (Signed)
The biopsies were benign The polyp in the stomach was benign with hyperplastic changes. At times we choose to remove these hyperplastic gastric polyps by endoscopy, but I think this is worth discussing in person Again no cancer, no infection. Please schedule OV, next available Phone discussion in lieu of path letter

## 2015-10-21 NOTE — Telephone Encounter (Signed)
Spoke with pt and he is aware. Scheduled pt to see Dr. Hilarie Fredrickson 12/14/15@3 :30pm. Pt aware of appt.

## 2015-10-21 NOTE — Telephone Encounter (Signed)
Pt calling for results, please advise

## 2015-10-31 ENCOUNTER — Encounter: Payer: Self-pay | Admitting: Neurology

## 2015-10-31 ENCOUNTER — Ambulatory Visit (INDEPENDENT_AMBULATORY_CARE_PROVIDER_SITE_OTHER): Payer: PPO | Admitting: Neurology

## 2015-10-31 VITALS — BP 119/62 | HR 65 | Ht 66.0 in | Wt 183.0 lb

## 2015-10-31 DIAGNOSIS — E785 Hyperlipidemia, unspecified: Secondary | ICD-10-CM

## 2015-10-31 DIAGNOSIS — Z87891 Personal history of nicotine dependence: Secondary | ICD-10-CM

## 2015-10-31 DIAGNOSIS — I63342 Cerebral infarction due to thrombosis of left cerebellar artery: Secondary | ICD-10-CM

## 2015-10-31 DIAGNOSIS — I10 Essential (primary) hypertension: Secondary | ICD-10-CM

## 2015-10-31 NOTE — Addendum Note (Signed)
Addended by: Rosalin Hawking on: 10/31/2015 08:54 AM   Modules accepted: Orders

## 2015-10-31 NOTE — Patient Instructions (Addendum)
-   continue ASA and lovastatin for stroke prevention - consider OTC topical cream for left index finger arthritis and pain - check BP at home and record. - BP goal 120-150 due to left VA and ICA congenital narrowing. - Follow up with your primary care physician for stroke risk factor. modification. Recommend maintain blood pressure goal 120-150, diabetes with hemoglobin A1c goal below 6.5% and lipids with LDL cholesterol goal below 70 mg/dL.  - follow up in 6 months.

## 2015-10-31 NOTE — Progress Notes (Signed)
STROKE NEUROLOGY FOLLOW UP NOTE  NAME: Cody Willis DOB: 18-May-1940  REASON FOR VISIT: stroke follow up HISTORY FROM: Patient and chart  Today we had the pleasure of seeing Cody Willis in follow-up at our Neurology Clinic. Pt was accompanied by wife.   History Summary [name] is a 75 y.o. male with history of HTN and HLD and smoker admitted on 01/16/15 for acute vertigo and nausea. He did not receive IV t-PA due to symptoms resovled. MRIshowed 2 L cerebellar infarcts (9mm and punctate) and CTA head and neck left VA high grade stenosis versus occlusion, likely dissection versus atherosclerosis. 2D Echounremarkable and LDL 61 with A1C 5.9. He was put on dual antiplatelet with aspirin and Plavix and continued on lovastatin. Smoking cessation recommended.  03/21/15 follow up - the patient has been doing well.  No stroke like symptoms. He quit smoking since the stroke. Blood pressure at home 110-140 without any blood pressure medication. Denies any snoring during sleep or apnea. Had episode of the vertigo on 02/08/2015, went to ER for inhalation. MRI showed no acute stroke. His symptoms improved and he was sent back home. Blood pressure today 111/58. 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  05/02/15 follow up - pt has been doing well from stroke standpoint. No recurrent stroke like symptoms. However, patient complains of intermittent right side scalp numbness and soreness, especially at night sleeping on the right side, relieved when sleeping on the left side. He also complains of neck tenderness intermittently. Patient is anxious regarding whether the symptoms are related to his stroke in the past. He felt sometimes nauseous when about symptoms occur.  07/25/15 follow up - pt has no stroke like symptoms. Continue to complain of intermittent right scalp tenderness on touch while sleeping in couch to the right. If he sleeps in  couch to the left or in the middle or sleeps in bed, there is no scalp pain. He also complain of bilateral hand hurts at joints, fingers tingling, and bilateral arm itchiness since two months ago. He is on plavix and lovastatin. BP 119/68.  Interval History During the interval time, he has been doing well. No stroke symptoms. Also, he has no more scalp pain. Not able to tolerate gabapentin, feeling groggy and drowsy and down, gabapentin topped by himself. Complains of left index finger pain at night, likely arthritis, he uses topical patch to help pain. BP 119/62 today and he stated BP at home typically 13-145/70-80.   REVIEW OF SYSTEMS: Full 14 system review of systems performed and notable only for those listed below and in HPI above, all others are negative:  Constitutional:   Cardiovascular:  Ear/Nose/Throat:   Skin:  Eyes:   Respiratory:   Gastroitestinal:   Genitourinary:  Hematology/Lymphatic:  Endocrine:  Musculoskeletal:   Allergy/Immunology:   Neurological:   Psychiatric:  Sleep:   The following represents the patient's updated allergies and side effects list: Allergies  Allergen Reactions  . Plavix [Clopidogrel Bisulfate] Itching  . Sulfamethoxazole-Trimethoprim Rash    The neurologically relevant items on the patient's problem list were reviewed on today's visit.  Neurologic Examination  A problem focused neurological exam (12 or more points of the single system neurologic examination, vital signs counts as 1 point, cranial nerves count for 8 points) was performed.  Blood pressure 119/62, pulse 65, height 5\' 6"  (1.676 m), weight 183 lb (83 kg).  General - Well nourished,  well developed, in no apparent distress.  Ophthalmologic - Sharp disc margins OU.  Cardiovascular - Regular rate and rhythm with no murmur.  Mental Status -  Level of arousal and orientation to time, place, and person were intact. Language including expression, naming, repetition,  comprehension was assessed and found intact. Fund of Knowledge was assessed and was intact.  Cranial Nerves II - XII - II - Visual field intact OU. III, IV, VI - Extraocular movements intact. V - Facial sensation intact bilaterally. VII - Facial movement intact bilaterally. VIII - Hearing & vestibular intact bilaterally. X - Palate elevates symmetrically. XI - Chin turning & shoulder shrug intact bilaterally. XII - Tongue protrusion intact.  Motor Strength - The patient's strength was normal in all extremities and pronator drift was absent.  Bulk was normal and fasciculations were absent.   Motor Tone - Muscle tone was assessed at the neck and appendages and was normal.  Reflexes - The patient's reflexes were 1+ in all extremities and he had no pathological reflexes.  Sensory - Light touch, temperature/pinprick were assessed and were normal.    Coordination - The patient had normal movements in the hands and feet with no ataxia or dysmetria.  Tremor was absent.  Gait and Station - The patient's transfers, posture, gait, station, and turns were observed as normal.  Data reviewed: I personally reviewed the images and agree with the radiology interpretations.  Ct Head Wo Contrast 01/16/2015 1. No acute intracranial abnormalities. 2. Very mild chronic microvascular ischemic changes in the cerebral white matter  Mr Brain Wo Contrast 01/16/2015 1. Acute nonhemorrhagic 12 mm infarct within the inferior left cerebellum. 2. Second punctate infarct also in the left cerebellum. 3. Abnormal signal in left vertebral artery suggesting slow or occluded flow. 4. Question abnormal signal in the precavernous left internal carotid artery, also suggesting slow flow. 5. Mild subcortical white matter changes, slightly exaggerated for age.   CTA head and neck -  01/17/2015 IMPRESSION: 1. 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 No evidence of hemorrhagic  conversion or progressive left cerebellar infarction since brain MRI yesterday; the left PICA is open proximally. 2. Changes of remote left ICA dissection with effective occlusion at the petrous cavernous junction. Left A1 segment is hypoplastic but there is large and patent left posterior communicating artery. 3. Atherosclerosis without flow limiting stenosis. 4. Emphysema. It seems his left ICA and VA are in smaller caliber than right ICA and VA, there is left VA high grade stenosis vs. Occlusion, likely dissection vs. Athro, but left ICA no stenosis but congenitally smaller caliber.  2D echo - - Left ventricle: The cavity size was normal. Wall thickness was normal. Systolic function was normal. The estimated ejection fraction was in the range of 60% to 65%. Wall motion was normal; there were no regional wall motion abnormalities. Left ventricular diastolic function parameters were normal. - Pulmonary arteries: PA peak pressure: 33 mm Hg (S).  Component     Latest Ref Rng 01/17/2015  Cholesterol     0 - 200 mg/dL 116  Triglycerides     <150 mg/dL 88  HDL Cholesterol     >40 mg/dL 37 (L)  Total CHOL/HDL Ratio      3.1  VLDL     0 - 40 mg/dL 18  LDL (calc)     0 - 99 mg/dL 61  Hemoglobin A1C     4.8 - 5.6 % 5.9 (H)  Mean Plasma Glucose  123    Assessment: As you may recall, he is a 75 y.o. Caucasian male with PMH of HTN and HLD and smoker admitted on 01/16/15 for left cerebellar infarcts PICA territory and CTA head and neck left VA high grade stenosis versus occlusion, likely dissection versus atherosclerosis. 2D Echounremarkable and LDL 61 with A1C 5.9. He was put on dual antiplatelet and continued on lovastatin. During the interval time, the patient has been doing well.  Has quit smoking. BP in good control. Had an episode of the vertigo on 02/08/2015, MRI showed no acute stroke. completed DAPT for 3 months and transitioned to plavix alone. Pt complains of bilateral arm  itchiness, hand pain and finger numbness, not able to tolerate low dose gabapentin. Due to the concern of plavix side effect with pruritus, change plavix to ASA. Pruritus resolved. No recurrent stroke like symptoms.  Plan:  - continue ASA and lovastatin for stroke prevention - continue abstain from smoking - consider OTC topical cream for left index finger arthritis and pain - check BP at home and record. - BP goal 120-150 due to left VA and ICA congenital narrowing. - Follow up with your primary care physician for stroke risk factor. modification. Recommend maintain blood pressure goal 120-150, diabetes with hemoglobin A1c goal below 6.5% and lipids with LDL cholesterol goal below 70 mg/dL.  - follow up in 6 months.   No orders of the defined types were placed in this encounter.   Meds ordered this encounter  Medications  . gabapentin (NEURONTIN) 100 MG capsule    Sig: Take by mouth daily.     There are no Patient Instructions on file for this visit.  Rosalin Hawking, MD PhD [facility] [address], [place], [place] [address] [phone]

## 2015-11-03 DIAGNOSIS — Z961 Presence of intraocular lens: Secondary | ICD-10-CM | POA: Diagnosis not present

## 2015-12-14 ENCOUNTER — Encounter: Payer: Self-pay | Admitting: Internal Medicine

## 2015-12-14 ENCOUNTER — Ambulatory Visit (INDEPENDENT_AMBULATORY_CARE_PROVIDER_SITE_OTHER): Payer: PPO | Admitting: Internal Medicine

## 2015-12-14 VITALS — BP 120/64 | HR 72 | Ht 65.5 in | Wt 181.5 lb

## 2015-12-14 DIAGNOSIS — R195 Other fecal abnormalities: Secondary | ICD-10-CM

## 2015-12-14 DIAGNOSIS — R1033 Periumbilical pain: Secondary | ICD-10-CM

## 2015-12-14 DIAGNOSIS — K2100 Gastro-esophageal reflux disease with esophagitis, without bleeding: Secondary | ICD-10-CM

## 2015-12-14 DIAGNOSIS — K861 Other chronic pancreatitis: Secondary | ICD-10-CM | POA: Diagnosis not present

## 2015-12-14 DIAGNOSIS — K317 Polyp of stomach and duodenum: Secondary | ICD-10-CM

## 2015-12-14 MED ORDER — PANCRELIPASE (LIP-PROT-AMYL) 40000-136000 UNITS PO CPEP: ORAL_CAPSULE | ORAL | 4 refills | Status: DC

## 2015-12-14 MED ORDER — PANCRELIPASE (LIP-PROT-AMYL) 40000-136000 UNITS PO CPEP: ORAL_CAPSULE | ORAL | 0 refills | Status: DC

## 2015-12-14 NOTE — Patient Instructions (Addendum)
Take [name] 40,000 units, 2 capsules with each meal, 1 capsule with each snack  Call our office if your mid/upper abdominal pain does not improve within a few weeks of increased zenpep dosage  Continue Protonix.  You will be due for a recall endoscopy in 09/2016. We will send you a reminder in the mail when it gets closer to that time.  Follow up with Dr Hilarie Fredrickson on Tuesday, 03/13/16 @ 3:00 pm.  If you are age 74 or older, your body mass index should be between 23-30. Your Body mass index is 29.74 kg/m. If this is out of the aforementioned range listed, please consider follow up with your Primary Care Provider.  If you are age 29 or younger, your body mass index should be between 19-25. Your Body mass index is 29.74 kg/m. If this is out of the aformentioned range listed, please consider follow up with your Primary Care Provider.

## 2015-12-14 NOTE — Progress Notes (Signed)
Subjective:    Patient ID: Cody Willis, male    DOB: July 12, 1940, 75 y.o.   MRN: [id number]  HPI Cody Willis is a 75 year old male with a past medical history of chronic idiopathic pancreatitis, hyperplastic gastric polyp, GERD, B12 deficiency, hyperlipidemia, hypertension and history of stroke who is here for follow-up. He was last seen in June 2017 at which time he was having issues with reflux, dysphagia and epigastric pain. [name] 3 capsules with meals was recommended.  He came for upper endoscopy performed on 10/06/2015. This showed mild LA grade a esophagitis. Dilation was performed with Savary dilator to 17 mm.  A 12 mm sessile polypoid lesion was found in the prepyloric region of the stomach which was biopsied. The remaining stomach was normal. A 15 mm sessile polypoid lesion was found in the duodenal bulb which was also biopsied. Pathology from the prepyloric lesion showed benign gastric mucosa with hyperplastic epithelial changes. H. pylori was negative. There was no metaplasia. The duodenal bulb lesion was gastric metaplasia/heterotopia.  He reports that he has been having intermittent loose stools and also mid to lower abdominal crampy discomfort. This seems to be somewhat worse after eating. His heartburn is much better with pantoprazole and in fact resolved. As has his epigastric pain. Dilation seem to help his dysphagia. He is using [name] only one capsule with meals because of the expense of this medication and trying to ration it.  He occasionally is using Imodium for loose stools which that can cause constipation. Overall his weight is increased since stopping smoking. He is up about 15 pounds but reports holding steady over the last several months to nearly a year. He denies blood in his stool and melena.  Review of Systems As per history of present illness, otherwise negative  Current Medications, Allergies, Past Medical History, Past Surgical History, Family History and  Social History were reviewed in [employer] record.     Objective:   Physical Exam BP 120/64 (BP Location: Left Arm, Patient Position: Sitting, Cuff Size: Normal)   Pulse 72   Ht 5' 5.5" (1.664 m)   Wt 181 lb 8 oz (82.3 kg)   BMI 29.74 kg/m  Constitutional: Well-developed and well-nourished. No distress. HEENT: Normocephalic and atraumatic. Oropharynx is clear and moist. No oropharyngeal exudate. Conjunctivae are normal.  No scleral icterus. Neck: Neck supple. Trachea midline. Cardiovascular: Normal rate, regular rhythm and intact distal pulses.  Pulmonary/chest: Effort normal and breath sounds normal. No wheezing, rales or rhonchi. Abdominal: Soft, Mild mid and lower tenderness with deep palpation without rebound or guarding, nondistended. Bowel sounds active throughout. There are no masses palpable. No hepatosplenomegaly. Extremities: no clubbing, cyanosis, or edema Neurological: Alert and oriented to person place and time. Skin: Skin is warm and dry. No rashes noted. Psychiatric: Normal mood and affect. Behavior is normal.  CBC    Component Value Date/Time   WBC 9.6 07/26/2015 1332   RBC 4.95 07/26/2015 1332   HGB 15.1 07/26/2015 1332   HGB 13.7 07/28/2012 1858   HCT 44.0 07/26/2015 1332   HCT 40.2 07/28/2012 1858   PLT 180.0 07/26/2015 1332   PLT 243 07/28/2012 1858   MCV 89.0 07/26/2015 1332   MCV 90 07/28/2012 1858   MCH 29.4 02/08/2015 1128   MCHC 34.3 07/26/2015 1332   RDW 14.5 07/26/2015 1332   RDW 14.2 07/28/2012 1858   LYMPHSABS 1.5 07/26/2015 1332   MONOABS 0.9 07/26/2015 1332   EOSABS 0.2 07/26/2015 1332  BASOSABS 0.1 07/26/2015 1332   CMP     Component Value Date/Time   NA 140 07/26/2015 1332   NA 139 07/28/2012 1858   K 3.9 07/26/2015 1332   K 3.7 07/28/2012 1858   CL 104 07/26/2015 1332   CL 107 07/28/2012 1858   CO2 29 07/26/2015 1332   CO2 27 07/28/2012 1858   GLUCOSE 103 (H) 07/26/2015 1332   GLUCOSE 114 (H) 07/28/2012  1858   BUN 18 07/26/2015 1332   BUN 16 07/28/2012 1858   CREATININE 1.10 07/26/2015 1332   CREATININE 1.27 07/28/2012 1858   CALCIUM 9.4 07/26/2015 1332   CALCIUM 8.9 07/28/2012 1858   PROT 7.0 07/26/2015 1332   ALBUMIN 4.6 07/26/2015 1332   AST 25 07/26/2015 1332   ALT 22 07/26/2015 1332   ALKPHOS 50 07/26/2015 1332   BILITOT 0.9 07/26/2015 1332   GFRNONAA >60 02/08/2015 1128   GFRNONAA 56 (L) 07/28/2012 1858   GFRAA >60 02/08/2015 1128   GFRAA >60 07/28/2012 1858       Assessment & Plan:  75 year old male with a past medical history of chronic idiopathic pancreatitis, hyperplastic gastric polyp, GERD, B12 deficiency, hyperlipidemia, hypertension and history of stroke who is here for follow-up.  1. GERD/epigastric pain -- improved with pantoprazole. Continue pantoprazole 40 mg daily. He can use Pepcid 20 g in the evening or at night if needed for breakthrough heartburn. GERD diet discussed and recommended.  2. Hyperplastic gastric polyp -- the gastric polyp in the prepyloric antrum showed hyperplastic epithelial changes. We discussed rarely hyperplastic polyps can progress into precancer or cancer. We discussed resection versus observation. Resection would carry a risk of bleeding during the post procedure period. After this discussion he would prefer surveillance rather than immediate resection. I feel that this is raised will. We will repeat endoscopy in one year for surveillance of this lesion.  3. Mid and lower abdominal cramping and loose stools --likely related to underdosing pancreatic enzymes due to the expense. I recommended that he increase [name] to 80,000 units of lipase with each meal and 40,000 units of lipase with snacks. This medication is now on a more preferred tier with his insurance and is affordable. If this fails to improve abdominal pain, I recommend CT scan of the abdomen and pelvis. He's asked to let us know if this symptom does not improve  4. Pancreatic  insufficiency/History of chronic idiopathic pancreatitis -- see #3 above. Continue [name] and cross-sectional imaging if abdominal pain/cramping fails to improve  2-3 month follow-up, sooner if necessary 25 minutes spent with the patient today. Greater than 50% was spent in counseling and coordination of care with the patient

## 2016-01-09 ENCOUNTER — Encounter: Payer: Self-pay | Admitting: Internal Medicine

## 2016-01-09 ENCOUNTER — Ambulatory Visit (INDEPENDENT_AMBULATORY_CARE_PROVIDER_SITE_OTHER): Payer: PPO | Admitting: Internal Medicine

## 2016-01-09 VITALS — BP 124/76 | HR 62 | Temp 97.6°F | Wt 183.0 lb

## 2016-01-09 DIAGNOSIS — N50819 Testicular pain, unspecified: Secondary | ICD-10-CM

## 2016-01-09 MED ORDER — DOXYCYCLINE HYCLATE 100 MG PO TABS: 100.0000 mg | ORAL_TABLET | Freq: Two times a day (BID) | ORAL | 0 refills | Status: DC

## 2016-01-09 NOTE — Progress Notes (Signed)
Pre visit review using our clinic review tool, if applicable. No additional management support is needed unless otherwise documented below in the visit note. 

## 2016-01-09 NOTE — Progress Notes (Signed)
Subjective:    Patient ID: Cody Willis, male    DOB: 08-12-1940, 75 y.o.   MRN: [id number]  HPI Here due to pain in testicles Started around 3 weeks ago--but getting worse Really bad on the riding mower Walking not as bad Occasionally will hurt just sitting around  No apparent swelling--but may seem bigger than normal No urinary urgency or dysuria Stable nocturia x 2-3 No discharge  Current Outpatient Prescriptions on File Prior to Visit  Medication Sig Dispense Refill  . aspirin EC 325 MG tablet Take 1 tablet (325 mg total) by mouth daily. 90 tablet 3  . cyanocobalamin (,VITAMIN B-12,) 1000 MCG/ML injection Inject 1 mL (1,000 mcg total) into the muscle every 30 (thirty) days. pharmascy-please provide appropriate needles and syringes 12 mL 0  . famotidine (PEPCID) 20 MG tablet Take 20 mg by mouth 2 (two) times daily as needed for heartburn or indigestion.    Marland Kitchen loperamide (IMODIUM A-D) 2 MG tablet Take 2 mg by mouth as needed for diarrhea or loose stools.     . lovastatin (MEVACOR) 40 MG tablet TAKE 1 TABLET BY MOUTH EVERY NIGHT AT BEDTIME 90 tablet 3  . Pancrelipase, Lip-Prot-Amyl, ([name]) 40000 units CPEP Take 2 capsules with each meal, 1 capsule with each snack 240 capsule 4  . pantoprazole (PROTONIX) 40 MG tablet Take 1 tablet (40 mg total) by mouth every morning. 90 tablet 1   No current facility-administered medications on file prior to visit.     Allergies  Allergen Reactions  . Plavix [Clopidogrel Bisulfate] Itching  . Sulfamethoxazole-Trimethoprim Rash    Past Medical History:  Diagnosis Date  . Chronic pancreatitis ([facility])   . Esophageal reflux   . Essential hypertension, benign   . Gastric polyps   . Hiatal hernia   . Other B-complex deficiencies   . Pure hypercholesterolemia   . Stroke ([place]) 01/16/2015   Pt. started on plavix    Past Surgical History:  Procedure Laterality Date  . APPENDECTOMY    . BACK SURGERY     Diskectomy between L4 and L5  .  CATARACT EXTRACTION W/ INTRAOCULAR LENS  IMPLANT, BILATERAL  12/15 and 2/16  . CHOLECYSTECTOMY    . MELANOMA EXCISION  ~2009   right upper abdomen    Family History  Problem Relation Age of Onset  . Stroke Mother   . Crohn's disease Sister   . Colitis Brother   . Colon cancer Neg Hx     Social History   Social History  . Marital status: Married    Spouse name: N/A  . Number of children: 2  . Years of education: N/A   Occupational History  . Truck driver-- local     part time   Social History Main Topics  . Smoking status: Former Smoker    Packs/day: 1.00    Types: Cigarettes    Quit date: 01/16/2015  . Smokeless tobacco: Never Used     Comment: Counseling sheet given in exam room to quit smoking   . Alcohol use No  . Drug use: No  . Sexual activity: Not on file   Other Topics Concern  . Not on file   Social History Narrative   No living will   Wife should make decisions for him if needed   Would accept resuscitation attempts   Not sure about tube feeds   Review of Systems Bowels are okay No recent sex-- >2 years No fever Appetite is fine  Objective:   Physical Exam  Abdominal: Soft. There is no tenderness.  Genitourinary:  Genitourinary Comments: No hernia Testes not enlarged, red or warm--but are mildly tender Epididymis normal bilat Rectal--- no prostate enlargement or nodules---but has mild tenderness          Assessment & Plan:

## 2016-01-09 NOTE — Patient Instructions (Signed)
Please call if you still have the pain after the week of antibiotics. I will set you up with a urologist.

## 2016-01-09 NOTE — Assessment & Plan Note (Signed)
No hernia or sig findings. Urethra normal ?mild prostatitis/orchitis Will try doxy for 1 week--then send to urologist if persistent pain

## 2016-03-13 ENCOUNTER — Encounter (INDEPENDENT_AMBULATORY_CARE_PROVIDER_SITE_OTHER): Payer: Self-pay

## 2016-03-13 ENCOUNTER — Encounter: Payer: Self-pay | Admitting: Internal Medicine

## 2016-03-13 ENCOUNTER — Ambulatory Visit (INDEPENDENT_AMBULATORY_CARE_PROVIDER_SITE_OTHER): Payer: PPO | Admitting: Internal Medicine

## 2016-03-13 VITALS — BP 138/70 | HR 72 | Ht 65.5 in | Wt 182.1 lb

## 2016-03-13 DIAGNOSIS — M7989 Other specified soft tissue disorders: Secondary | ICD-10-CM

## 2016-03-13 DIAGNOSIS — M79641 Pain in right hand: Secondary | ICD-10-CM | POA: Diagnosis not present

## 2016-03-13 DIAGNOSIS — K861 Other chronic pancreatitis: Secondary | ICD-10-CM

## 2016-03-13 DIAGNOSIS — M79642 Pain in left hand: Secondary | ICD-10-CM

## 2016-03-13 DIAGNOSIS — K219 Gastro-esophageal reflux disease without esophagitis: Secondary | ICD-10-CM

## 2016-03-13 DIAGNOSIS — R1310 Dysphagia, unspecified: Secondary | ICD-10-CM

## 2016-03-13 MED ORDER — PANCRELIPASE (LIP-PROT-AMYL) 25000 UNITS PO CPEP: 2.0000 | ORAL_CAPSULE | Freq: Three times a day (TID) | ORAL | 3 refills | Status: DC

## 2016-03-13 NOTE — Patient Instructions (Addendum)
You have been scheduled for an appointment with Dr Leigh Aurora for hand swelling and pain.  Their office is [place] Phone [phone]. Your appointment is scheduled for ____________ at ___________.  We have sent the following medications to your pharmacy for you to pick up at your convenience: zenpep 25,000, 2 capsules with each meal  Please continue Protonix.  You have been scheduled for a Barium Esophogram with tablet at [facility] (1st floor of the hospital) on Wednesday 03/21/16 at 9:30 am. Please arrive 15 minutes prior to your appointment for registration. Make certain not to have anything to eat or drink 6 hours prior to your test. If you need to reschedule for any reason, please contact radiology at [phone] to do so. __________________________________________________________________ A barium swallow is an examination that concentrates on views of the esophagus. This tends to be a double contrast exam (barium and two liquids which, when combined, create a gas to distend the wall of the oesophagus) or single contrast (non-ionic iodine based). The study is usually tailored to your symptoms so a good history is essential. Attention is paid during the study to the form, structure and configuration of the esophagus, looking for functional disorders (such as aspiration, dysphagia, achalasia, motility and reflux) EXAMINATION You may be asked to change into a gown, depending on the type of swallow being performed. A radiologist and radiographer will perform the procedure. The radiologist will advise you of the type of contrast selected for your procedure and direct you during the exam. You will be asked to stand, sit or lie in several different positions and to hold a small amount of fluid in your mouth before being asked to swallow while the imaging is performed .In some instances you may be asked to swallow barium coated marshmallows to assess the motility of a  solid food bolus. The exam can be recorded as a digital or video fluoroscopy procedure. POST PROCEDURE It will take 1-2 days for the barium to pass through your system. To facilitate this, it is important, unless otherwise directed, to increase your fluids for the next 24-48hrs and to resume your normal diet.  This test typically takes about 30 minutes to perform. __________________________________________________________________________________

## 2016-03-14 NOTE — Progress Notes (Signed)
   Subjective:    Patient ID: Cody Willis, male    DOB: [birth year], 75 y.o.   MRN: [id number]  HPI Cody Willis is a 75 year old male with a past medical history of chronic idiopathic pancreatitis, hyperplastic gastric polyp, GERD, B12 deficiency, hypertension, hyperlipidemia and stroke who is here for follow-up. He was last seen in September 2017.  He reports he's been feeling good. He has noticed some food stopping near the sternal notch, which had previously been a complaint. Dilation was performed at the time of his last EGD which was in June 2017. Dilation was performed to 17 mm Savary. This didn't seem to help much. He continues pantoprazole 40 mg daily which he states works really well for his reflux. No odynophagia. Dysphagia is intermittent and worse for pills. He denies abdominal pain of late. No nausea or vomiting. Reports appetite is "too good". His [name] is been very expensive and he requests to change back to 25,000 units of lipase and 2 tablets with meals. He reports bowel movements is regular without blood or melena.  He has had bilateral hand swelling and pain and requests to see a rheumatologist.   Review of Systems As per HPI, otherwise negative  Current Medications, Allergies, Past Medical History, Past Surgical History, Family History and Social History were reviewed in [employer] record.     Objective:   Physical Exam BP 138/70 (BP Location: Left Arm, Patient Position: Sitting, Cuff Size: Normal)   Pulse 72   Ht 5' 5.5" (1.664 m)   Wt 182 lb 2 oz (82.6 kg)   BMI 29.85 kg/m  Constitutional: Well-developed and well-nourished. No distress. HEENT: Normocephalic and atraumatic.  Conjunctivae are normal.  No scleral icterus. Neck: Neck supple. Trachea midline. Cardiovascular: Normal rate, regular rhythm and intact distal pulses. Pulmonary/chest: Effort normal and breath sounds normal. No wheezing, rales or rhonchi. Abdominal: Soft,  nontender, nondistended. Bowel sounds active throughout.  Extremities: no clubbing, cyanosis, or edema, b/l tenderness and synovitis in hands and PIP and MCP joints Neurological: Alert and oriented to person place and time. Skin: Skin is warm and dry.  Psychiatric: Normal mood and affect. Behavior is normal.     Assessment & Plan:  75 year old male with a past medical history of chronic idiopathic pancreatitis, hyperplastic gastric polyp, GERD, B12 deficiency, hypertension, hyperlipidemia and stroke who is here for follow-up.   1. GERD -- improved pantoprazole. Continue 40 mg daily.  2. Dysphagia -- intermittent. Possibly presbyesophagus area and no response to dilation. Barium swallow with tablet recommended and scheduled  3. Hyperplastic gastric polyp -- hyperplastic epithelial changes in the prepyloric antrum. Plan surveillance at one year to reevaluate this lesion. If size increasing would likely need resection which he understands would be higher risk for bleeding complication.  4. Chronic pancreatitis -- continue [name], will try to reduce dose to 25,000 units of lipase 2 capsules with meals. If this is too expensive notify me and we will work with drug rep to see if we can get assistance. No significant loose stools recently. No reported abdominal pain  5. Hand pain -- rheumatology referral to Dr. Amil Amen  Six-month follow-up, sooner if necessary 25 minutes spent with the patient today. Greater than 50% was spent in counseling and coordination of care with the patient

## 2016-03-16 ENCOUNTER — Other Ambulatory Visit: Payer: Self-pay | Admitting: Internal Medicine

## 2016-03-19 ENCOUNTER — Telehealth: Payer: Self-pay | Admitting: Internal Medicine

## 2016-03-19 NOTE — Telephone Encounter (Signed)
I have advised patient that per Dr Melissa Noon office, patient's records have been received but are awaiting a review. When review is completed, they will call patient with an appointment date and time. Patient verbalizes understanding and will let us know if he does not hear anything in the next 2 or 3 days.

## 2016-03-21 ENCOUNTER — Ambulatory Visit (HOSPITAL_COMMUNITY)
Admission: RE | Admit: 2016-03-21 | Discharge: 2016-03-21 | Disposition: A | Discharge status: Home / Self Care | Payer: PPO | Source: Ambulatory Visit | Attending: Internal Medicine | Admitting: Internal Medicine

## 2016-03-21 DIAGNOSIS — R1310 Dysphagia, unspecified: Secondary | ICD-10-CM | POA: Insufficient documentation

## 2016-03-21 DIAGNOSIS — K209 Esophagitis, unspecified: Secondary | ICD-10-CM | POA: Diagnosis not present

## 2016-03-21 DIAGNOSIS — K449 Diaphragmatic hernia without obstruction or gangrene: Secondary | ICD-10-CM | POA: Diagnosis not present

## 2016-03-27 ENCOUNTER — Other Ambulatory Visit: Payer: Self-pay | Admitting: Internal Medicine

## 2016-03-27 ENCOUNTER — Telehealth: Payer: Self-pay | Admitting: Internal Medicine

## 2016-03-27 NOTE — Telephone Encounter (Signed)
Pt scheduled to see Dr. Benjamine Mola 03/28/16@4pm , pt to arrive there at 4:50pm. Pt aware of appt and records faxed to [phone].

## 2016-03-27 NOTE — Telephone Encounter (Signed)
Patient is scheduled to see Dr Amil Amen 04/11/16 @ 1:30 pm. Patient is aware of appointment.

## 2016-03-28 DIAGNOSIS — R1312 Dysphagia, oropharyngeal phase: Secondary | ICD-10-CM | POA: Diagnosis not present

## 2016-03-28 DIAGNOSIS — H6121 Impacted cerumen, right ear: Secondary | ICD-10-CM | POA: Diagnosis not present

## 2016-03-28 DIAGNOSIS — K225 Diverticulum of esophagus, acquired: Secondary | ICD-10-CM | POA: Diagnosis not present

## 2016-04-06 ENCOUNTER — Ambulatory Visit (HOSPITAL_COMMUNITY)
Admission: EM | Admit: 2016-04-06 | Discharge: 2016-04-06 | Disposition: A | Discharge status: Home / Self Care | Payer: PPO | Attending: Family Medicine | Admitting: Family Medicine

## 2016-04-06 ENCOUNTER — Ambulatory Visit (INDEPENDENT_AMBULATORY_CARE_PROVIDER_SITE_OTHER): Payer: PPO

## 2016-04-06 ENCOUNTER — Encounter (HOSPITAL_COMMUNITY): Payer: Self-pay | Admitting: Emergency Medicine

## 2016-04-06 DIAGNOSIS — J40 Bronchitis, not specified as acute or chronic: Secondary | ICD-10-CM | POA: Diagnosis not present

## 2016-04-06 DIAGNOSIS — R05 Cough: Secondary | ICD-10-CM | POA: Diagnosis not present

## 2016-04-06 MED ORDER — AZITHROMYCIN 250 MG PO TABS: 250.0000 mg | ORAL_TABLET | Freq: Every day | ORAL | 0 refills | Status: DC

## 2016-04-06 NOTE — Discharge Instructions (Signed)
Return if any problems.  See your Physician for recheck in 2-3 days °

## 2016-04-06 NOTE — ED Provider Notes (Signed)
CSN: [id number]     Arrival date & time 04/06/16  1850 History   None    Chief Complaint  Patient presents with  . Cough  . Sore Throat   (Consider location/radiation/quality/duration/timing/severity/associated sxs/prior Treatment) The history is provided by the patient. No language interpreter was used.  Cough  Cough characteristics:  Productive Sputum characteristics:  Nondescript Severity:  Moderate Onset quality:  Gradual Duration:  1 week Timing:  Constant Progression:  Worsening Smoker: no   Relieved by:  Nothing Worsened by:  Nothing Ineffective treatments:  None tried Risk factors: no recent infection   Sore Throat     Past Medical History:  Diagnosis Date  . Chronic pancreatitis ([place])   . Esophageal reflux   . Essential hypertension, benign   . Gastric polyps   . Hiatal hernia   . Other B-complex deficiencies   . Pure hypercholesterolemia   . Stroke ([place]) 01/16/2015   Pt. started on plavix   Past Surgical History:  Procedure Laterality Date  . APPENDECTOMY    . BACK SURGERY     Diskectomy between L4 and L5  . CATARACT EXTRACTION W/ INTRAOCULAR LENS  IMPLANT, BILATERAL  12/15 and 2/16  . CHOLECYSTECTOMY    . MELANOMA EXCISION  ~2009   right upper abdomen   Family History  Problem Relation Age of Onset  . Stroke Mother   . Crohn's disease Sister   . Colitis Brother   . Colon cancer Neg Hx    Social History  Substance Use Topics  . Smoking status: Former Smoker    Packs/day: 1.00    Types: Cigarettes    Quit date: 01/16/2015  . Smokeless tobacco: Never Used     Comment: Counseling sheet given in exam room to quit smoking   . Alcohol use No    Review of Systems  Respiratory: Positive for cough.   All other systems reviewed and are negative.   Allergies  Plavix [clopidogrel bisulfate] and Sulfamethoxazole-trimethoprim  Home Medications   Prior to Admission medications   Medication Sig Start Date End Date Taking? Authorizing Provider   aspirin EC 325 MG tablet Take 1 tablet (325 mg total) by mouth daily. 07/25/15  Yes Rosalin Hawking, MD  cyanocobalamin (,VITAMIN B-12,) 1000 MCG/ML injection INJECT 1ML INTO THE MUSCLE EVERY 30 DAYS 03/28/16  Yes Jerene Bears, MD  loperamide (IMODIUM A-D) 2 MG tablet Take 2 mg by mouth as needed for diarrhea or loose stools.    Yes Historical Provider, MD  lovastatin (MEVACOR) 40 MG tablet TAKE 1 TABLET BY MOUTH EVERY NIGHT AT BEDTIME 09/01/15  Yes Venia Carbon, MD  Pancrelipase, Lip-Prot-Amyl, ([name]) 25000 units CPEP Take 2 capsules by mouth 3 (three) times daily with meals. 03/13/16  Yes Jerene Bears, MD  pantoprazole (PROTONIX) 40 MG tablet TAKE 1 TABLET BY MOUTH EVERY MORNING 03/16/16  Yes Jerene Bears, MD  azithromycin (ZITHROMAX) 250 MG tablet Take 1 tablet (250 mg total) by mouth daily. Take first 2 tablets together, then 1 every day until finished. 04/06/16   Fransico Meadow, PA-C  famotidine (PEPCID) 20 MG tablet Take 20 mg by mouth 2 (two) times daily as needed for heartburn or indigestion.    Historical Provider, MD   Meds Ordered and Administered this Visit  Medications - No data to display  BP 170/72 (BP Location: Left Arm)   Pulse 84   Temp 99 F (37.2 C) (Oral)   SpO2 97%  No data found.  Physical Exam  Constitutional: He appears well-developed and well-nourished.  HENT:  Head: Normocephalic and atraumatic.  Right Ear: External ear normal.  Left Ear: External ear normal.  Mouth/Throat: Oropharynx is clear and moist.  Eyes: Conjunctivae are normal.  Neck: Neck supple.  Cardiovascular: Normal rate and regular rhythm.   No murmur heard. Pulmonary/Chest: Effort normal and breath sounds normal. No respiratory distress. He has no rales.  Abdominal: Soft. There is no tenderness.  Musculoskeletal: He exhibits no edema.  Neurological: He is alert.  Skin: Skin is warm and dry.  Psychiatric: He has a normal mood and affect.  Nursing note and vitals reviewed.   Urgent Care  Course   Clinical Course     Procedures (including critical care time)  Labs Review Labs Reviewed - No data to display  Imaging Review Dg Chest 2 View  Result Date: 04/06/2016 CLINICAL DATA:  Acute onset of cough and sinus congestion. Shortness of breath. Initial encounter. EXAM: CHEST  2 VIEW COMPARISON:  Chest radiograph performed 01/16/2015 FINDINGS: The lungs are well-aerated. Peribronchial thickening is noted. Mild left basilar atelectasis is seen. There is no evidence of pleural effusion or pneumothorax. The heart is normal in size; the mediastinal contour is within normal limits. No acute osseous abnormalities are seen. IMPRESSION: Peribronchial thickening noted.  Mild left basilar atelectasis seen. Electronically Signed   By: Garald Balding M.D.   On: 04/06/2016 19:54     Visual Acuity Review  Right Eye Distance:   Left Eye Distance:   Bilateral Distance:    Right Eye Near:   Left Eye Near:    Bilateral Near:         MDM   1. Bronchitis    Meds ordered this encounter  Medications  . DISCONTD: azithromycin (ZITHROMAX) 250 MG tablet    Sig: Take 1 tablet (250 mg total) by mouth daily. Take first 2 tablets together, then 1 every day until finished.    Dispense:  6 tablet    Refill:  0    Order Specific Question:   Supervising Provider    Answer:   Billy Fischer [id number]  . azithromycin (ZITHROMAX) 250 MG tablet    Sig: Take 1 tablet (250 mg total) by mouth daily. Take first 2 tablets together, then 1 every day until finished.    Dispense:  6 tablet    Refill:  0    Order Specific Question:   Supervising Provider    Answer:   Billy Fischer [id number]  An After Visit Summary was printed and given to the patient.    [place], PA-C 04/06/16 2027

## 2016-04-06 NOTE — ED Triage Notes (Signed)
Pt has been suffering from a sore throat and productive cough for three days.  Pt denies any fever.

## 2016-04-11 ENCOUNTER — Telehealth: Payer: Self-pay | Admitting: Internal Medicine

## 2016-04-11 DIAGNOSIS — R5383 Other fatigue: Secondary | ICD-10-CM | POA: Diagnosis not present

## 2016-04-11 DIAGNOSIS — M7989 Other specified soft tissue disorders: Secondary | ICD-10-CM | POA: Diagnosis not present

## 2016-04-11 DIAGNOSIS — M2550 Pain in unspecified joint: Secondary | ICD-10-CM | POA: Diagnosis not present

## 2016-04-11 DIAGNOSIS — Z6827 Body mass index (BMI) 27.0-27.9, adult: Secondary | ICD-10-CM | POA: Diagnosis not present

## 2016-04-11 DIAGNOSIS — E663 Overweight: Secondary | ICD-10-CM | POA: Diagnosis not present

## 2016-04-12 MED ORDER — CYANOCOBALAMIN 1000 MCG/ML IJ SOLN: INTRAMUSCULAR | 0 refills | Status: DC

## 2016-04-12 NOTE — Telephone Encounter (Signed)
New rx sent

## 2016-05-02 ENCOUNTER — Ambulatory Visit: Payer: PPO | Admitting: Neurology

## 2016-05-14 DIAGNOSIS — Z6829 Body mass index (BMI) 29.0-29.9, adult: Secondary | ICD-10-CM | POA: Diagnosis not present

## 2016-05-14 DIAGNOSIS — M0579 Rheumatoid arthritis with rheumatoid factor of multiple sites without organ or systems involvement: Secondary | ICD-10-CM | POA: Diagnosis not present

## 2016-05-14 DIAGNOSIS — E663 Overweight: Secondary | ICD-10-CM | POA: Diagnosis not present

## 2016-05-28 DIAGNOSIS — L812 Freckles: Secondary | ICD-10-CM | POA: Diagnosis not present

## 2016-05-28 DIAGNOSIS — D229 Melanocytic nevi, unspecified: Secondary | ICD-10-CM | POA: Diagnosis not present

## 2016-05-28 DIAGNOSIS — L821 Other seborrheic keratosis: Secondary | ICD-10-CM | POA: Diagnosis not present

## 2016-05-28 DIAGNOSIS — Z85828 Personal history of other malignant neoplasm of skin: Secondary | ICD-10-CM | POA: Diagnosis not present

## 2016-05-28 DIAGNOSIS — L570 Actinic keratosis: Secondary | ICD-10-CM | POA: Diagnosis not present

## 2016-05-28 DIAGNOSIS — Z85820 Personal history of malignant melanoma of skin: Secondary | ICD-10-CM | POA: Diagnosis not present

## 2016-05-28 DIAGNOSIS — D692 Other nonthrombocytopenic purpura: Secondary | ICD-10-CM | POA: Diagnosis not present

## 2016-05-28 DIAGNOSIS — D1800 Hemangioma unspecified site: Secondary | ICD-10-CM | POA: Diagnosis not present

## 2016-05-28 DIAGNOSIS — L718 Other rosacea: Secondary | ICD-10-CM | POA: Diagnosis not present

## 2016-05-28 DIAGNOSIS — L820 Inflamed seborrheic keratosis: Secondary | ICD-10-CM | POA: Diagnosis not present

## 2016-05-28 DIAGNOSIS — I8310 Varicose veins of unspecified lower extremity with inflammation: Secondary | ICD-10-CM | POA: Diagnosis not present

## 2016-05-28 DIAGNOSIS — Z1283 Encounter for screening for malignant neoplasm of skin: Secondary | ICD-10-CM | POA: Diagnosis not present

## 2016-07-03 DIAGNOSIS — Z6828 Body mass index (BMI) 28.0-28.9, adult: Secondary | ICD-10-CM | POA: Diagnosis not present

## 2016-07-03 DIAGNOSIS — M545 Low back pain: Secondary | ICD-10-CM | POA: Diagnosis not present

## 2016-07-03 DIAGNOSIS — R030 Elevated blood-pressure reading, without diagnosis of hypertension: Secondary | ICD-10-CM | POA: Diagnosis not present

## 2016-07-27 ENCOUNTER — Ambulatory Visit: Payer: PPO | Admitting: Internal Medicine

## 2016-08-31 ENCOUNTER — Encounter: Payer: Self-pay | Admitting: Internal Medicine

## 2016-09-10 ENCOUNTER — Other Ambulatory Visit: Payer: Self-pay | Admitting: Internal Medicine

## 2016-09-13 ENCOUNTER — Encounter: Payer: Self-pay | Admitting: Internal Medicine

## 2016-09-13 ENCOUNTER — Telehealth: Payer: Self-pay | Admitting: Internal Medicine

## 2016-09-13 MED ORDER — PANTOPRAZOLE SODIUM 40 MG PO TBEC: 40.0000 mg | DELAYED_RELEASE_TABLET | Freq: Every morning | ORAL | 0 refills | Status: DC

## 2016-09-13 NOTE — Telephone Encounter (Signed)
Rx sent until 11/2016 endoscopy appointment.

## 2016-09-17 ENCOUNTER — Ambulatory Visit (INDEPENDENT_AMBULATORY_CARE_PROVIDER_SITE_OTHER): Payer: PPO | Admitting: Internal Medicine

## 2016-09-17 ENCOUNTER — Encounter: Payer: Self-pay | Admitting: Internal Medicine

## 2016-09-17 VITALS — BP 124/70 | HR 69 | Temp 97.4°F | Wt 183.0 lb

## 2016-09-17 DIAGNOSIS — N451 Epididymitis: Secondary | ICD-10-CM | POA: Diagnosis not present

## 2016-09-17 MED ORDER — DOXYCYCLINE HYCLATE 100 MG PO TABS: 100.0000 mg | ORAL_TABLET | Freq: Two times a day (BID) | ORAL | 1 refills | Status: DC

## 2016-09-17 NOTE — Progress Notes (Signed)
Subjective:    Patient ID: Cody Willis, male    DOB: [birth year], 76 y.o.   MRN: [id number]  HPI Here due to testicular pain again Symptoms did improve for a few weeks after October visit Since then has had recurrent variable pain He was concerned so set up appt with Dr Diona Fanti  He remembers going to urologist 25 years ago for vasectomy Wonders if this is related Did notice decreased sex drive  Notes nocturia x 3 Also has frequency first thing in the morning, then settles down No hematuria Stream is not great--no sig change  Current Outpatient Prescriptions on File Prior to Visit  Medication Sig Dispense Refill  . aspirin EC 325 MG tablet Take 1 tablet (325 mg total) by mouth daily. 90 tablet 3  . cyanocobalamin (,VITAMIN B-12,) 1000 MCG/ML injection INJECT 1ML INTO THE MUSCLE EVERY 30 DAYS 12 mL 0  . famotidine (PEPCID) 20 MG tablet Take 20 mg by mouth 2 (two) times daily as needed for heartburn or indigestion.    Marland Kitchen loperamide (IMODIUM A-D) 2 MG tablet Take 2 mg by mouth as needed for diarrhea or loose stools.     . lovastatin (MEVACOR) 40 MG tablet TAKE 1 TABLET BY MOUTH EVERY NIGHT AT BEDTIME 90 tablet 3  . Pancrelipase, Lip-Prot-Amyl, ([name]) 25000 units CPEP Take 2 capsules by mouth 3 (three) times daily with meals. 160 capsule 3  . pantoprazole (PROTONIX) 40 MG tablet Take 1 tablet (40 mg total) by mouth every morning. 90 tablet 0   No current facility-administered medications on file prior to visit.     Allergies  Allergen Reactions  . Plavix [Clopidogrel Bisulfate] Itching  . Sulfamethoxazole-Trimethoprim Rash    Past Medical History:  Diagnosis Date  . Chronic pancreatitis ([place])   . Esophageal reflux   . Essential hypertension, benign   . Gastric polyps   . Hiatal hernia   . Other B-complex deficiencies   . Pure hypercholesterolemia   . Stroke ([place]) 01/16/2015   Pt. started on plavix    Past Surgical History:  Procedure Laterality Date  .  APPENDECTOMY    . BACK SURGERY     Diskectomy between L4 and L5  . CATARACT EXTRACTION W/ INTRAOCULAR LENS  IMPLANT, BILATERAL  12/15 and 2/16  . CHOLECYSTECTOMY    . MELANOMA EXCISION  ~2009   right upper abdomen    Family History  Problem Relation Age of Onset  . Stroke Mother   . Crohn's disease Sister   . Colitis Brother   . Colon cancer Neg Hx     Social History   Social History  . Marital status: Married    Spouse name: N/A  . Number of children: 2  . Years of education: N/A   Occupational History  . Truck driver-- local     part time   Social History Main Topics  . Smoking status: Former Smoker    Packs/day: 1.00    Types: Cigarettes    Quit date: 01/16/2015  . Smokeless tobacco: Never Used     Comment: Counseling sheet given in exam room to quit smoking   . Alcohol use No  . Drug use: No  . Sexual activity: Not on file   Other Topics Concern  . Not on file   Social History Narrative   No living will   Wife should make decisions for him if needed   Would accept resuscitation attempts   Not sure about tube feeds  Review of Systems  No fever Appetite has been good    Objective:   Physical Exam  Genitourinary:  Genitourinary Comments: Right testes and tube okay Left testes is slightly tender Thickening in left epididymis          Assessment & Plan:

## 2016-09-17 NOTE — Assessment & Plan Note (Addendum)
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 Will treat again with doxy Agree with urology evaluation since recurrent

## 2016-10-16 DIAGNOSIS — Z6827 Body mass index (BMI) 27.0-27.9, adult: Secondary | ICD-10-CM | POA: Diagnosis not present

## 2016-10-16 DIAGNOSIS — M545 Low back pain: Secondary | ICD-10-CM | POA: Diagnosis not present

## 2016-10-16 DIAGNOSIS — R030 Elevated blood-pressure reading, without diagnosis of hypertension: Secondary | ICD-10-CM | POA: Diagnosis not present

## 2016-11-01 DIAGNOSIS — M48061 Spinal stenosis, lumbar region without neurogenic claudication: Secondary | ICD-10-CM | POA: Diagnosis not present

## 2016-11-01 DIAGNOSIS — M545 Low back pain: Secondary | ICD-10-CM | POA: Diagnosis not present

## 2016-11-01 DIAGNOSIS — M5126 Other intervertebral disc displacement, lumbar region: Secondary | ICD-10-CM | POA: Diagnosis not present

## 2016-11-05 ENCOUNTER — Ambulatory Visit (INDEPENDENT_AMBULATORY_CARE_PROVIDER_SITE_OTHER): Payer: PPO | Admitting: Otolaryngology

## 2016-11-05 DIAGNOSIS — H6121 Impacted cerumen, right ear: Secondary | ICD-10-CM | POA: Diagnosis not present

## 2016-11-05 DIAGNOSIS — R1312 Dysphagia, oropharyngeal phase: Secondary | ICD-10-CM | POA: Diagnosis not present

## 2016-11-05 DIAGNOSIS — H524 Presbyopia: Secondary | ICD-10-CM | POA: Diagnosis not present

## 2016-11-05 DIAGNOSIS — Z961 Presence of intraocular lens: Secondary | ICD-10-CM | POA: Diagnosis not present

## 2016-11-06 ENCOUNTER — Ambulatory Visit: Payer: PPO | Admitting: *Deleted

## 2016-11-06 DIAGNOSIS — R1310 Dysphagia, unspecified: Secondary | ICD-10-CM

## 2016-11-06 NOTE — Progress Notes (Signed)
Patient arrived for previsit for EGD due to dysphagia. States he saw his ENT yesterday who informs him he has a pocket in the upper part of the throat that is what is causing pills to get stuck. He feels that EGD with dilation really was not helpful. He requests to cancel EGD now, see how he does with advice from ENT and call to schedule an office visit with Dr. Hilarie Fredrickson if he has any further issues. Also discussed timing of his next colonoscopy, informed him he will be due in 2021 and his health and any other contributing factors would be evaluated at that time to determine if age appropriate.

## 2016-11-07 DIAGNOSIS — N401 Enlarged prostate with lower urinary tract symptoms: Secondary | ICD-10-CM | POA: Diagnosis not present

## 2016-11-07 DIAGNOSIS — N50812 Left testicular pain: Secondary | ICD-10-CM | POA: Diagnosis not present

## 2016-11-12 ENCOUNTER — Other Ambulatory Visit: Payer: Self-pay | Admitting: Internal Medicine

## 2016-11-13 ENCOUNTER — Telehealth: Payer: Self-pay | Admitting: *Deleted

## 2016-11-13 DIAGNOSIS — M5416 Radiculopathy, lumbar region: Secondary | ICD-10-CM | POA: Diagnosis not present

## 2016-11-13 DIAGNOSIS — K317 Polyp of stomach and duodenum: Secondary | ICD-10-CM

## 2016-11-13 MED ORDER — PANTOPRAZOLE SODIUM 40 MG PO TBEC: 40.0000 mg | DELAYED_RELEASE_TABLET | Freq: Every morning | ORAL | 0 refills | Status: DC

## 2016-11-13 NOTE — Telephone Encounter (Signed)
Patient walked in office today to ask why Protonix had been denied. I advised that he needs endoscopy and that he had cancelled and not rescheduled. Patient states that he went to Dr Benjamine Mola (ENT) last week and was told that he had a "hole in my throat" and that is why he was having difficulty swallowing pills (it appears per Dr Deeann Saint note that patient has zenkers diverticulum). States he was told by RN last week at previsit that it was up to him whether he kept endoscopy appointment or not. I explained that the endoscopy ws not for dysphagia but rather for surveillence of hyperplastic gastric polyps seen on endoscopy back in 09/2015. At his office visit back in 03/2016, he and Dr Hilarie Fredrickson decided to re-evaluate in 1 year and if increasing in size, possibly resect. After explaining this to patient, he is in agreement with rescheduling the procedure. However, I advised that I would confirm this with Dr Hilarie Fredrickson since our office had given him 2 different answers (for further info please see telephone note from 10/21/15, office note 03/13/16, & letter 08/31/16)...  Patient is currently scheduled for 11/26/16 endoscopy at 10:00 am. I have given him verbal and written instructions for time/date/location and prep for endoscopy procedure as well as informed consent all to which he verbalizes understanding.

## 2016-11-15 ENCOUNTER — Encounter: Payer: Self-pay | Admitting: Internal Medicine

## 2016-11-20 ENCOUNTER — Encounter: Payer: PPO | Admitting: Internal Medicine

## 2016-11-26 ENCOUNTER — Ambulatory Visit (AMBULATORY_SURGERY_CENTER): Payer: PPO | Admitting: Internal Medicine

## 2016-11-26 ENCOUNTER — Encounter: Payer: Self-pay | Admitting: Internal Medicine

## 2016-11-26 VITALS — BP 133/91 | HR 65 | Temp 98.0°F | Resp 19 | Ht 65.5 in | Wt 183.0 lb

## 2016-11-26 DIAGNOSIS — K2970 Gastritis, unspecified, without bleeding: Secondary | ICD-10-CM | POA: Diagnosis not present

## 2016-11-26 DIAGNOSIS — J449 Chronic obstructive pulmonary disease, unspecified: Secondary | ICD-10-CM | POA: Diagnosis not present

## 2016-11-26 DIAGNOSIS — Z8719 Personal history of other diseases of the digestive system: Secondary | ICD-10-CM

## 2016-11-26 DIAGNOSIS — K317 Polyp of stomach and duodenum: Secondary | ICD-10-CM

## 2016-11-26 DIAGNOSIS — Z8673 Personal history of transient ischemic attack (TIA), and cerebral infarction without residual deficits: Secondary | ICD-10-CM | POA: Diagnosis not present

## 2016-11-26 DIAGNOSIS — K219 Gastro-esophageal reflux disease without esophagitis: Secondary | ICD-10-CM | POA: Diagnosis not present

## 2016-11-26 DIAGNOSIS — I272 Pulmonary hypertension, unspecified: Secondary | ICD-10-CM | POA: Diagnosis not present

## 2016-11-26 MED ORDER — PANTOPRAZOLE SODIUM 40 MG PO TBEC: 40.0000 mg | DELAYED_RELEASE_TABLET | Freq: Every morning | ORAL | 12 refills | Status: DC

## 2016-11-26 MED ORDER — PANCRELIPASE (LIP-PROT-AMYL) 25000 UNITS PO CPEP: 2.0000 | ORAL_CAPSULE | Freq: Three times a day (TID) | ORAL | 12 refills | Status: DC

## 2016-11-26 MED ORDER — SODIUM CHLORIDE 0.9 % IV SOLN: 500.0000 mL | INTRAVENOUS | Status: DC

## 2016-11-26 NOTE — Op Note (Signed)
[facility] Patient Name: Cody Willis Procedure Date: 11/26/2016 10:09 AM MRN: [id number] Endoscopist: Jerene Bears , MD Age: 76 Referring MD:  Date of Birth: 12/01/1940 Gender: Male Account #: [id number] Procedure:                Upper GI endoscopy Indications:              Follow-up of gastric polyp in the antrum with                            hyperplastic epithelial changes in June 2017 Medicines:                Monitored Anesthesia Care Procedure:                Pre-Anesthesia Assessment:                           - Prior to the procedure, a History and Physical                            was performed, and patient medications and                            allergies were reviewed. The patient's tolerance of                            previous anesthesia was also reviewed. The risks                            and benefits of the procedure and the sedation                            options and risks were discussed with the patient.                            All questions were answered, and informed consent                            was obtained. Prior Anticoagulants: The patient has                            taken no previous anticoagulant or antiplatelet                            agents. ASA Grade Assessment: III - A patient with                            severe systemic disease. After reviewing the risks                            and benefits, the patient was deemed in                            satisfactory condition to undergo the procedure.  After obtaining informed consent, the endoscope was                            passed under direct vision. Throughout the                            procedure, the patient's blood pressure, pulse, and                            oxygen saturations were monitored continuously. The                            Endoscope was introduced through the mouth, and                            advanced to the  second part of duodenum. The upper                            GI endoscopy was accomplished without difficulty.                            The patient tolerated the procedure well. Scope In: Scope Out: Findings:                 A 2 cm hiatal hernia was present.                           The exam of the esophagus was otherwise normal.                           A single 10-12 mm area appearing like a sessile                            polyp with no bleeding was found on the lesser                            curvature of the gastric antrum. This appears                            unchanged from 1 year ago. Multiple biopsies were                            obtained with cold forceps for histology in a                            targeted manner.                           The exam of the stomach was otherwise normal.                           A single 10-12 mm sessile polyp with no bleeding  was found in the duodenal bulb. This is unchanged                            from one year ago. Multiple biopsies were obtained                            with cold forceps for histology in a targeted                            manner.                           The exam of the duodenum was otherwise normal. Complications:            No immediate complications. Estimated Blood Loss:     Estimated blood loss was minimal. Impression:               - 2 cm hiatal hernia.                           - Stable, gastric polypoid mucosa in the antrum.                            Multiple biopsies.                           - A single, stable, duodenal polyp. Multiple                            biopsies. Recommendation:           - Patient has a contact number available for                            emergencies. The signs and symptoms of potential                            delayed complications were discussed with the                            patient. Return to normal activities tomorrow.                             Written discharge instructions were provided to the                            patient.                           - Resume previous diet.                           - Continue present medications.                           - Await pathology results. Jerene Bears, MD 11/26/2016 10:30:58 AM This report has been signed electronically.

## 2016-11-26 NOTE — Patient Instructions (Signed)
YOU HAD AN ENDOSCOPIC PROCEDURE TODAY AT [facility]:   Refer to the procedure report that was given to you for any specific questions about what was found during the examination.  If the procedure report does not answer your questions, please call your gastroenterologist to clarify.  If you requested that your care partner not be given the details of your procedure findings, then the procedure report has been included in a sealed envelope for you to review at your convenience later.  YOU SHOULD EXPECT: Some feelings of bloating in the abdomen. Passage of more gas than usual.  Walking can help get rid of the air that was put into your GI tract during the procedure and reduce the bloating. If you had a lower endoscopy (such as a colonoscopy or flexible sigmoidoscopy) you may notice spotting of blood in your stool or on the toilet paper. If you underwent a bowel prep for your procedure, you may not have a normal bowel movement for a few days.  Please Note:  You might notice some irritation and congestion in your nose or some drainage.  This is from the oxygen used during your procedure.  There is no need for concern and it should clear up in a day or so.  SYMPTOMS TO REPORT IMMEDIATELY:   Following upper endoscopy (EGD)  Vomiting of blood or coffee ground material  New chest pain or pain under the shoulder blades  Painful or persistently difficult swallowing  New shortness of breath  Fever of 100F or higher  Black, tarry-looking stools  For urgent or emergent issues, a gastroenterologist can be reached at any hour by calling [phone].   DIET:  We do recommend a small meal at first, but then you may proceed to your regular diet.  Drink plenty of fluids but you should avoid alcoholic beverages for 24 hours.  ACTIVITY:  You should plan to take it easy for the rest of today and you should NOT DRIVE or use heavy machinery until tomorrow (because of the sedation medicines used  during the test).    FOLLOW UP: Our staff will call the number listed on your records the next business day following your procedure to check on you and address any questions or concerns that you may have regarding the information given to you following your procedure. If we do not reach you, we will leave a message.  However, if you are feeling well and you are not experiencing any problems, there is no need to return our call.  We will assume that you have returned to your regular daily activities without incident.  If any biopsies were taken you will be contacted by phone or by letter within the next 1-3 weeks.  Please call us at [phone] if you have not heard about the biopsies in 3 weeks.   Await for biopsy results Hiatal Hernia (handout given)   SIGNATURES/CONFIDENTIALITY: You and/or your care partner have signed paperwork which will be entered into your electronic medical record.  These signatures attest to the fact that that the information above on your After Visit Summary has been reviewed and is understood.  Full responsibility of the confidentiality of this discharge information lies with you and/or your care-partner.

## 2016-11-26 NOTE — Progress Notes (Signed)
Called to room to assist during endoscopic procedure.  Patient ID and intended procedure confirmed with present staff. Received instructions for my participation in the procedure from the performing physician.  

## 2016-11-26 NOTE — Progress Notes (Signed)
Report to PACU, RN, vss, BBS= Clear.  

## 2016-11-26 NOTE — Progress Notes (Signed)
Pt. Reports no change in his surgical or medical history since pre-visit 11/13/2016.

## 2016-11-27 ENCOUNTER — Telehealth: Payer: Self-pay | Admitting: *Deleted

## 2016-11-27 NOTE — Telephone Encounter (Signed)
  Follow up Call-  Call back number 11/26/2016 10/06/2015  Post procedure Call Back phone  # [phone]  Permission to leave phone message Yes Yes  Some recent data might be hidden     Patient questions:  Do you have a fever, pain , or abdominal swelling? No. Pain Score  0 *  Have you tolerated food without any problems? Yes.    Have you been able to return to your normal activities? Yes.    Do you have any questions about your discharge instructions: Diet   No. Medications  No. Follow up visit  No.  Do you have questions or concerns about your Care? No.  Actions: * If pain score is 4 or above: No action needed, pain <4.

## 2016-11-29 ENCOUNTER — Encounter: Payer: Self-pay | Admitting: Internal Medicine

## 2017-02-06 ENCOUNTER — Other Ambulatory Visit: Payer: Self-pay | Admitting: Internal Medicine

## 2017-04-29 ENCOUNTER — Ambulatory Visit (INDEPENDENT_AMBULATORY_CARE_PROVIDER_SITE_OTHER): Payer: PPO | Admitting: Internal Medicine

## 2017-04-29 ENCOUNTER — Encounter: Payer: Self-pay | Admitting: Internal Medicine

## 2017-04-29 VITALS — BP 134/72 | HR 77 | Temp 97.7°F | Resp 16 | Wt 182.0 lb

## 2017-04-29 DIAGNOSIS — J0100 Acute maxillary sinusitis, unspecified: Secondary | ICD-10-CM | POA: Insufficient documentation

## 2017-04-29 DIAGNOSIS — R809 Proteinuria, unspecified: Secondary | ICD-10-CM | POA: Diagnosis not present

## 2017-04-29 LAB — COMPREHENSIVE METABOLIC PANEL
ALBUMIN: 4.6 g/dL (ref 3.5–5.2)
ALT: 19 U/L (ref 0–53)
AST: 22 U/L (ref 0–37)
Alkaline Phosphatase: 59 U/L (ref 39–117)
BILIRUBIN TOTAL: 1.1 mg/dL (ref 0.2–1.2)
BUN: 15 mg/dL (ref 6–23)
CALCIUM: 9.6 mg/dL (ref 8.4–10.5)
CO2: 32 meq/L (ref 19–32)
CREATININE: 1.11 mg/dL (ref 0.40–1.50)
Chloride: 102 mEq/L (ref 96–112)
GFR: 68.47 mL/min (ref >60.00)
Glucose, Bld: 91 mg/dL (ref 70–99)
Potassium: 4.6 mEq/L (ref 3.5–5.1)
SODIUM: 140 meq/L (ref 135–145)
Total Protein: 7.1 g/dL (ref 6.0–8.3)

## 2017-04-29 LAB — CBC
HCT: 45.4 % (ref 39.0–52.0)
Hemoglobin: 15.4 g/dL (ref 13.0–17.0)
MCHC: 33.9 g/dL (ref 30.0–36.0)
MCV: 91 fl (ref 78.0–100.0)
Platelets: 165 10*3/uL (ref 150.0–400.0)
RBC: 4.99 Mil/uL (ref 4.22–5.81)
RDW: 14.3 % (ref 11.5–15.5)
WBC: 13.1 10*3/uL — ABNORMAL HIGH (ref 4.0–10.5)

## 2017-04-29 LAB — SEDIMENTATION RATE: SED RATE: 11 mm/h (ref 0–20)

## 2017-04-29 MED ORDER — HYDROCODONE-HOMATROPINE 5-1.5 MG/5ML PO SYRP: 5.0000 mL | ORAL_SOLUTION | Freq: Every evening | ORAL | 0 refills | Status: DC | PRN

## 2017-04-29 NOTE — Progress Notes (Signed)
Subjective:    Patient ID: Cody Willis, male    DOB: [birth year], 77 y.o.   MRN: [id number]  HPI Here due to respiratory symptoms Went to [facility] ago --for [facility] physical Next day---started with respiratory symptoms  Left maxillary pain Yellow nasal drainage with some blood Has congestion---keeping him from sleeping well at night (does better in the recliner) Using OTC nasal spray--discussed avoiding this  No fever Lots of cough--day and night No sig SOB---but some trouble pushing wheelbarrow of wood (has stove)  Tried mucinex twice---may have helped  Current Outpatient Medications on File Prior to Visit  Medication Sig Dispense Refill  . aspirin EC 325 MG tablet Take 1 tablet (325 mg total) by mouth daily. 90 tablet 3  . cyanocobalamin (,VITAMIN B-12,) 1000 MCG/ML injection INJECT 1ML INTO THE MUSCLE EVERY 30 DAYS 12 mL 0  . loperamide (IMODIUM A-D) 2 MG tablet Take 2 mg by mouth as needed for diarrhea or loose stools.     . lovastatin (MEVACOR) 40 MG tablet TAKE 1 TABLET BY MOUTH AT BEDTIME MUST SCHEDULE OFFICE VISIT 90 tablet 0  . Pancrelipase, Lip-Prot-Amyl, ([name]) 25000 units CPEP Take 2 capsules by mouth 3 (three) times daily with meals. 160 capsule 12  . pantoprazole (PROTONIX) 40 MG tablet Take 1 tablet (40 mg total) by mouth every morning. 30 tablet 12  . traMADol (ULTRAM) 50 MG tablet      Current Facility-Administered Medications on File Prior to Visit  Medication Dose Route Frequency Provider Last Rate Last Dose  . 0.9 %  sodium chloride infusion  500 mL Intravenous Continuous Pyrtle, Lajuan Lines, MD        Allergies  Allergen Reactions  . Plavix [Clopidogrel Bisulfate] Itching  . Sulfamethoxazole-Trimethoprim Rash    Past Medical History:  Diagnosis Date  . Chronic pancreatitis ([place])   . Esophageal reflux   . Essential hypertension, benign   . Gastric polyps   . Hiatal hernia   . Other B-complex deficiencies   . Pure hypercholesterolemia   .  Stroke ([place]) 01/16/2015   Pt. started on plavix    Past Surgical History:  Procedure Laterality Date  . APPENDECTOMY    . BACK SURGERY     Diskectomy between L4 and L5  . CATARACT EXTRACTION W/ INTRAOCULAR LENS  IMPLANT, BILATERAL  12/15 and 2/16  . CHOLECYSTECTOMY    . MELANOMA EXCISION  ~2009   right upper abdomen    Family History  Problem Relation Age of Onset  . Stroke Mother   . Crohn's disease Sister   . Colitis Brother   . Colon cancer Neg Hx     Social History   Socioeconomic History  . Marital status: Married    Spouse name: Not on file  . Number of children: 2  . Years of education: Not on file  . Highest education level: Not on file  Social Needs  . Financial resource strain: Not on file  . Food insecurity - worry: Not on file  . Food insecurity - inability: Not on file  . Transportation needs - medical: Not on file  . Transportation needs - non-medical: Not on file  Occupational History  . 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 Smoking status: Former Smoker    Packs/day: 1.00    Types: Cigarettes    Last attempt to quit: 01/16/2015    Years since quitting: 2.2  . Smokeless tobacco: Never  Used  . Tobacco comment: Counseling sheet given in exam room to quit smoking   Substance and Sexual Activity  . Alcohol use: No    Alcohol/week: 0.0 oz  . Drug use: No  . Sexual activity: Not on file  Other Topics Concern  . Not on file  Social History Narrative   No living will   Wife should make decisions for him if needed   Would accept resuscitation attempts   Not sure about tube feeds   Review of Systems Nocturia x 3 No rash No vomiting or diarrhea Appetite is okay    Objective:   Physical Exam  Constitutional: He appears well-developed. No distress.  HENT:  Mouth/Throat: Oropharynx is clear and moist. No oropharyngeal exudate.  No sinus tenderness Marked nasal inflammation TMs normal   Neck: No thyromegaly  present.  Pulmonary/Chest: Effort normal. No respiratory distress. He has no wheezes. He has no rales.  Slightly decreased breath sounds but clear  Lymphadenopathy:    He has no cervical adenopathy.          Assessment & Plan:

## 2017-04-29 NOTE — Assessment & Plan Note (Signed)
On left side mostly Still likely viral---discussed Stop the OTC medicated nasal spray Cough med If worsens later in week, would try empiric amoxil

## 2017-04-29 NOTE — Assessment & Plan Note (Signed)
Found at his CD[facility] physical Will check some labs and has follow up next week (can do AMW then)

## 2017-04-29 NOTE — Patient Instructions (Signed)
Please let me know if you are getting worse at the end of the week or next week.

## 2017-04-30 ENCOUNTER — Telehealth (INDEPENDENT_AMBULATORY_CARE_PROVIDER_SITE_OTHER): Payer: PPO | Admitting: Internal Medicine

## 2017-04-30 ENCOUNTER — Telehealth: Payer: Self-pay

## 2017-04-30 ENCOUNTER — Encounter: Payer: Self-pay | Admitting: *Deleted

## 2017-04-30 DIAGNOSIS — R809 Proteinuria, unspecified: Secondary | ICD-10-CM | POA: Diagnosis not present

## 2017-04-30 LAB — URINALYSIS, ROUTINE W REFLEX MICROSCOPIC
Bilirubin Urine: NEGATIVE
LEUKOCYTES UA: NEGATIVE
Nitrite: NEGATIVE
URINE GLUCOSE: NEGATIVE
UROBILINOGEN UA: 0.2 (ref 0.0–1.0)
pH: 5.5 (ref 5.0–8.0)

## 2017-04-30 NOTE — Addendum Note (Signed)
Addended by: Ellamae Sia on: 04/30/2017 09:44 AM   Modules accepted: Orders

## 2017-04-30 NOTE — Telephone Encounter (Signed)
Copied from [place]. Topic: Quick Communication - See Telephone Encounter >> Apr 30, 2017  2:22 PM Synthia Innocent wrote: CRM for notification. See Telephone encounter for:  [facility] calling received urine sample with no order ref # [id number] D 04/30/17.

## 2017-04-30 NOTE — Telephone Encounter (Signed)
Sent to [facility] in error, recollected sample.

## 2017-04-30 NOTE — Telephone Encounter (Signed)
Pt came in for a labs. He told me that the Hycodan cough syrup is making him itch, causes stomach upset, and he just doesn't feel well on it. He is asking for something else for his cough. He uses [employer]. He is aware it will be tomorrow.

## 2017-05-01 MED ORDER — BENZONATATE 200 MG PO CAPS: 200.0000 mg | ORAL_CAPSULE | Freq: Three times a day (TID) | ORAL | 0 refills | Status: DC | PRN

## 2017-05-01 NOTE — Addendum Note (Signed)
Addended by: Pilar Grammes on: 05/01/2017 09:46 AM   Modules accepted: Orders

## 2017-05-01 NOTE — Telephone Encounter (Signed)
Please let him know I sent a different cough med---little pills (tessalon) He can take them in the day also since not sedating

## 2017-05-01 NOTE — Telephone Encounter (Signed)
Spoke to pt. He said he ended up vomiting last evening.

## 2017-05-04 ENCOUNTER — Encounter (HOSPITAL_COMMUNITY): Payer: Self-pay | Admitting: Family Medicine

## 2017-05-04 ENCOUNTER — Ambulatory Visit (HOSPITAL_COMMUNITY)
Admission: EM | Admit: 2017-05-04 | Discharge: 2017-05-04 | Disposition: A | Discharge status: Home / Self Care | Payer: PPO | Attending: Physician Assistant | Admitting: Physician Assistant

## 2017-05-04 DIAGNOSIS — R05 Cough: Secondary | ICD-10-CM

## 2017-05-04 DIAGNOSIS — J069 Acute upper respiratory infection, unspecified: Secondary | ICD-10-CM

## 2017-05-04 DIAGNOSIS — R059 Cough, unspecified: Secondary | ICD-10-CM

## 2017-05-04 DIAGNOSIS — J209 Acute bronchitis, unspecified: Secondary | ICD-10-CM | POA: Diagnosis not present

## 2017-05-04 MED ORDER — AZITHROMYCIN 250 MG PO TABS: 250.0000 mg | ORAL_TABLET | Freq: Every day | ORAL | 0 refills | Status: DC

## 2017-05-04 MED ORDER — PREDNISONE 10 MG PO TABS: ORAL_TABLET | ORAL | 0 refills | Status: AC

## 2017-05-04 MED ORDER — ALBUTEROL SULFATE HFA 108 (90 BASE) MCG/ACT IN AERS: INHALATION_SPRAY | RESPIRATORY_TRACT | 2 refills | Status: DC

## 2017-05-04 NOTE — ED Triage Notes (Signed)
Cough- pt here for cough x 8 days. His PCP gave him hydrocodone cough syrup and he had an allergic reaction. On the 23 rd he was given tessalon pearls and they are not helping his cough. He is up all night coughing. Pt hasn't been on any abx. He is also taking mucinex, 4 total, and has been using nasal saline spray.

## 2017-05-04 NOTE — Discharge Instructions (Signed)
You have bronchitis with a probable bacterial component. Use the inhaler every 6 hours for the first 2 days then as needed for cough and wheeze. Take the prednisone daily as directed on the bottle. Use Delsym for cough every 12 hours (this is over the counter) and the azithromycin is the antibiotic. Hopefully with this combination you will be feeling much better. Drink plenty of water during this for the cough. F/U as needed.

## 2017-05-04 NOTE — ED Provider Notes (Signed)
[facility]    CSN: [id number] Arrival date & time: 05/04/17  1207     History   Chief Complaint Chief Complaint  Patient presents with  . Cough    HPI Cody Willis is a 77 y.o. male.    Who presents with ongoing cough and wheeze x 8 days. As noted in triage note he was in to see his PCP on Monday and was thought to have a viral infection and treated with cough medications as noted. He has continued to worsen over the last week. He is fatigued. His cough has worsened, now with "yellow production". And at times "very wheezy". No fevers are noted. Some mild sinus congestion.       Past Medical History:  Diagnosis Date  . Chronic pancreatitis ([place])   . Esophageal reflux   . Essential hypertension, benign   . Gastric polyps   . Hiatal hernia   . Other B-complex deficiencies   . Pure hypercholesterolemia   . Stroke ([place]) 01/16/2015   Pt. started on plavix    Patient Active Problem List   Diagnosis Date Noted  . Acute non-recurrent maxillary sinusitis 04/29/2017  . Proteinuria 04/29/2017  . Epididymitis 09/17/2016  . Testicular pain 01/09/2016  . Former smoker 10/31/2015  . Preventative health care 07/26/2015  . Advance directive discussed with patient 07/26/2015  . Pain in hand 07/25/2015  . COPD exacerbation ([place]) 05/19/2015  . Occipital neuralgia of right side 05/02/2015  . Cerebrovascular accident (CVA) due to thrombosis of left cerebellar artery ([place]) 03/21/2015  . Smoker 03/21/2015  . Essential hypertension 03/21/2015  . Cerebellar infarct ([place]) 01/21/2015  . Stroke ([place]) 01/17/2015  . HLD (hyperlipidemia)   . COPD (chronic obstructive pulmonary disease) ([place]) 08/17/2014  . CHRONIC PANCREATITIS 01/03/2010  . B12 DEFICIENCY 05/13/2009  . ESSENTIAL HYPERTENSION, BENIGN 03/22/2009  . HYPERCHOLESTEROLEMIA 07/02/2007  . ACID REFLUX DISEASE 07/02/2007    Past Surgical History:  Procedure Laterality Date  . APPENDECTOMY    . BACK SURGERY     Diskectomy between L4 and L5  . CATARACT EXTRACTION W/ INTRAOCULAR LENS  IMPLANT, BILATERAL  12/15 and 2/16  . CHOLECYSTECTOMY    . MELANOMA EXCISION  ~2009   right upper abdomen       Home Medications    Prior to Admission medications   Medication Sig Start Date End Date Taking? Authorizing Provider  albuterol (PROVENTIL HFA;VENTOLIN HFA) 108 (90 Base) MCG/ACT inhaler 2 puffs inhaled every 6 hours x 36 hours then as needed for wheeze and cough 05/04/17   Maeryn Mcgath, Vanessa Ojus, PA-C  aspirin EC 325 MG tablet Take 1 tablet (325 mg total) by mouth daily. 07/25/15   Rosalin Hawking, MD  azithromycin (ZITHROMAX) 250 MG tablet Take 1 tablet (250 mg total) by mouth daily. Take first 2 tablets together, then 1 every day until finished. 05/04/17   Bjorn Pippin, PA-C  benzonatate (TESSALON) 200 MG capsule Take 1 capsule (200 mg total) by mouth 3 (three) times daily as needed for cough. 05/01/17   Venia Carbon, MD  cyanocobalamin (,VITAMIN B-12,) 1000 MCG/ML injection INJECT 1ML INTO THE MUSCLE EVERY 30 DAYS 04/12/16   Pyrtle, Lajuan Lines, MD  loperamide (IMODIUM A-D) 2 MG tablet Take 2 mg by mouth as needed for diarrhea or loose stools.     [provider]  lovastatin (MEVACOR) 40 MG tablet TAKE 1 TABLET BY MOUTH AT BEDTIME MUST SCHEDULE OFFICE VISIT 02/06/17   Venia Carbon, MD  Pancrelipase,  Lip-Prot-Amyl, ([name]) 25000 units CPEP Take 2 capsules by mouth 3 (three) times daily with meals. 11/26/16   Pyrtle, Lajuan Lines, MD  pantoprazole (PROTONIX) 40 MG tablet Take 1 tablet (40 mg total) by mouth every morning. 11/26/16   Pyrtle, Lajuan Lines, MD  predniSONE (DELTASONE) 10 MG tablet 2 tablets po x 3 days, then 1.5 tablets po x 3 days ,then 1 tablet po x 3 days, then 1 tablet po x 3 days then stop 05/04/17 05/16/17  Bjorn Pippin, PA-C  traMADol Veatrice Bourbon) 50 MG tablet  11/12/16   [provider]    Family History Family History  Problem Relation Age of Onset  . Stroke Mother   . Crohn's disease  Sister   . Colitis Brother   . Colon cancer Neg Hx     Social History Social History   Tobacco Use  . Smoking status: Former Smoker    Packs/day: 1.00    Types: Cigarettes    Last attempt to quit: 01/16/2015    Years since quitting: 2.2  . Smokeless tobacco: Never Used  . Tobacco comment: Counseling sheet given in exam room to quit smoking   Substance Use Topics  . Alcohol use: No    Alcohol/week: 0.0 oz  . Drug use: No     Allergies   Plavix [clopidogrel bisulfate] and Sulfamethoxazole-trimethoprim   Review of Systems Review of Systems  Constitutional: Positive for fatigue. Negative for chills and fever.  HENT: Positive for congestion. Negative for sneezing.   Eyes: Negative.   Respiratory: Positive for cough and wheezing. Negative for apnea.   Skin: Negative for rash.  Allergic/Immunologic: Negative.   Neurological: Negative.      Physical Exam Triage Vital Signs ED Triage Vitals [05/04/17 1241]  Enc Vitals Group     BP (!) 143/72     Pulse Rate 66     Resp 18     Temp 97.8 F (36.6 C)     Temp src      SpO2 98 %     Weight      Height      Head Circumference      Peak Flow      Pain Score      Pain Loc      Pain Edu?      Excl. in [place]?    No data found.  Updated Vital Signs BP (!) 143/72   Pulse 66   Temp 97.8 F (36.6 C)   Resp 18   SpO2 98%   Visual Acuity Right Eye Distance:   Left Eye Distance:   Bilateral Distance:    Right Eye Near:   Left Eye Near:    Bilateral Near:     Physical Exam  Constitutional: He is oriented to person, place, and time. He appears well-developed and well-nourished. No distress.  HENT:  Head: Normocephalic and atraumatic.  Mouth/Throat: Oropharynx is clear and moist. No oropharyngeal exudate.  Neck: Normal range of motion.  Cardiovascular: Normal rate and regular rhythm.  Pulmonary/Chest: Effort normal. He has wheezes.  Moderate expiratory wheeze throughout lung fields with few rhonchi throughout    Lymphadenopathy:    He has no cervical adenopathy.  Neurological: He is alert and oriented to person, place, and time.  Skin: Skin is warm and dry. He is not diaphoretic.  Psychiatric: His behavior is normal.  Nursing note and vitals reviewed.    UC Treatments / Results  Labs (all labs ordered are listed, but only  abnormal results are displayed) Labs Reviewed - No data to display  EKG  EKG Interpretation None       Radiology No results found.  Procedures Procedures (including critical care time)  Medications Ordered in UC Medications - No data to display   Initial Impression / Assessment and Plan / UC Course  I have reviewed the triage vital signs and the nursing notes.  Pertinent labs & imaging results that were available during my care of the patient were reviewed by me and considered in my medical decision making (see chart for details).   given duration and clinical exam treat with MDI, steroids and Z-pack. Push fluids and rest. FU if worsens.   Final Clinical Impressions(s) / UC Diagnoses   Final diagnoses:  Acute bronchitis, unspecified organism  Upper respiratory tract infection, unspecified type  Cough    ED Discharge Orders        Ordered    albuterol (PROVENTIL HFA;VENTOLIN HFA) 108 (90 Base) MCG/ACT inhaler     05/04/17 1314    predniSONE (DELTASONE) 10 MG tablet     05/04/17 1314    azithromycin (ZITHROMAX) 250 MG tablet  Daily     05/04/17 1314       Controlled Substance Prescriptions [place] Controlled Substance Registry consulted? Not Applicable   Bjorn Pippin, PA-C 05/04/17 1322

## 2017-05-07 ENCOUNTER — Ambulatory Visit (INDEPENDENT_AMBULATORY_CARE_PROVIDER_SITE_OTHER): Payer: PPO | Admitting: Internal Medicine

## 2017-05-07 ENCOUNTER — Encounter: Payer: Self-pay | Admitting: Internal Medicine

## 2017-05-07 ENCOUNTER — Other Ambulatory Visit: Payer: Self-pay | Admitting: Internal Medicine

## 2017-05-07 VITALS — BP 142/78 | HR 77 | Temp 97.5°F | Ht 65.5 in | Wt 176.8 lb

## 2017-05-07 DIAGNOSIS — Z8673 Personal history of transient ischemic attack (TIA), and cerebral infarction without residual deficits: Secondary | ICD-10-CM

## 2017-05-07 DIAGNOSIS — J449 Chronic obstructive pulmonary disease, unspecified: Secondary | ICD-10-CM | POA: Diagnosis not present

## 2017-05-07 DIAGNOSIS — Z0000 Encounter for general adult medical examination without abnormal findings: Secondary | ICD-10-CM | POA: Diagnosis not present

## 2017-05-07 MED ORDER — LOVASTATIN 40 MG PO TABS: ORAL_TABLET | ORAL | 3 refills | Status: DC

## 2017-05-07 NOTE — Assessment & Plan Note (Signed)
I have personally reviewed the Medicare Annual Wellness questionnaire and have noted 1. The patient's medical and social history 2. Their use of alcohol, tobacco or illicit drugs 3. Their current medications and supplements 4. The patient's functional ability including ADL's, fall risks, home safety risks and hearing or visual             impairment. 5. Diet and physical activities 6. Evidence for depression or mood disorders  The patients weight, height, BMI and visual acuity have been recorded in the chart I have made referrals, counseling and provided education to the patient based review of the above and I have provided the pt with a written personalized care plan for preventive services.  I have provided you with a copy of your personalized plan for preventive services. Please take the time to review along with your updated medication list.  Prefers no flu vaccine Discussed shingrix Consider FIT in 1-2 years (normal colon 2011) Discussed exercise Can stop PSAs---urologist has been checking

## 2017-05-07 NOTE — Progress Notes (Signed)
Hearing Screening   125Hz  250Hz  500Hz  1000Hz  2000Hz  3000Hz  4000Hz  6000Hz  8000Hz   Right ear:   40 40 40  40    Left ear:   25 25 25  25     Vision Screening Comments: Completed July 2018

## 2017-05-07 NOTE — Assessment & Plan Note (Signed)
No residual Stays on ASA

## 2017-05-07 NOTE — Assessment & Plan Note (Signed)
Recent infection and better with antibiotic and steroids Quit smoking 3 years ago or so

## 2017-05-07 NOTE — Progress Notes (Signed)
Subjective:    Patient ID: Cody Willis, male    DOB: 02/17/1941, 77 y.o.   MRN: [id number]  HPI Here for Medicare wellness visit Reviewed form and advanced directives Reviewed other doctors---see list No tobacco or alcohol No set exercise--but active on property (bucks and splits wood, etc) Still works part time No falls No depression or anhedonia Vision is fine Hearing only mildly off--- some tinnitus in right Independent with instrumental ADLs No sig memory issues  Got sick with the respiratory infection Now feels better with z-pak and steroids Cough is not productive and is settling down  No sequelae from CVA Takes ASA daily  Current Outpatient Medications on File Prior to Visit  Medication Sig Dispense Refill  . albuterol (PROVENTIL HFA;VENTOLIN HFA) 108 (90 Base) MCG/ACT inhaler 2 puffs inhaled every 6 hours x 36 hours then as needed for wheeze and cough 1 Inhaler 2  . aspirin EC 325 MG tablet Take 1 tablet (325 mg total) by mouth daily. 90 tablet 3  . azithromycin (ZITHROMAX) 250 MG tablet Take 1 tablet (250 mg total) by mouth daily. Take first 2 tablets together, then 1 every day until finished. 6 tablet 0  . cyanocobalamin (,VITAMIN B-12,) 1000 MCG/ML injection INJECT 1ML INTO THE MUSCLE EVERY 30 DAYS 12 mL 0  . loperamide (IMODIUM A-D) 2 MG tablet Take 2 mg by mouth as needed for diarrhea or loose stools.     . Pancrelipase, Lip-Prot-Amyl, ([name]) 25000 units CPEP Take 2 capsules by mouth 3 (three) times daily with meals. 160 capsule 12  . pantoprazole (PROTONIX) 40 MG tablet Take 1 tablet (40 mg total) by mouth every morning. 30 tablet 12  . predniSONE (DELTASONE) 10 MG tablet 2 tablets po x 3 days, then 1.5 tablets po x 3 days ,then 1 tablet po x 3 days, then 1 tablet po x 3 days then stop 21 tablet 0  . traMADol (ULTRAM) 50 MG tablet      No current facility-administered medications on file prior to visit.     Allergies  Allergen Reactions  . Plavix  [Clopidogrel Bisulfate] Itching  . Sulfamethoxazole-Trimethoprim Rash    Past Medical History:  Diagnosis Date  . Chronic pancreatitis ([place])   . Esophageal reflux   . Essential hypertension, benign   . Gastric polyps   . Hiatal hernia   . Other B-complex deficiencies   . Pure hypercholesterolemia   . Stroke ([place]) 01/16/2015   Pt. started on plavix    Past Surgical History:  Procedure Laterality Date  . APPENDECTOMY    . BACK SURGERY     Diskectomy between L4 and L5  . CATARACT EXTRACTION W/ INTRAOCULAR LENS  IMPLANT, BILATERAL  12/15 and 2/16  . CHOLECYSTECTOMY    . MELANOMA EXCISION  ~2009   right upper abdomen    Family History  Problem Relation Age of Onset  . Stroke Mother   . Crohn's disease Sister   . Colitis Brother   . Colon cancer Neg Hx     Social History   Socioeconomic History  . Marital status: Married    Spouse name: Not on file  . Number of children: 2  . Years of education: Not on file  . Highest education level: Not on file  Social Needs  . Financial resource strain: Not on file  . Food insecurity - worry: Not on file  . Food insecurity - inability: Not on file  . Transportation needs - medical: Not on file  .  Transportation needs - non-medical: Not on file  Occupational History  . 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 Smoking status: Former Smoker    Packs/day: 1.00    Types: Cigarettes    Last attempt to quit: 01/16/2015    Years since quitting: 2.3  . Smokeless tobacco: Never Used  . Tobacco comment: Counseling sheet given in exam room to quit smoking   Substance and Sexual Activity  . Alcohol use: No    Alcohol/week: 0.0 oz  . Drug use: No  . Sexual activity: Not on file  Other Topics Concern  . Not on file  Social History Narrative   No living will   Wife should make decisions for him if needed   Would accept resuscitation attempts   Not sure about tube feeds   Review of Systems Sleeps  well Wears seat belt Full dentures    Objective:   Physical Exam  Constitutional: He is oriented to person, place, and time. No distress.  Neurological: He is alert and oriented to person, place, and time.  President--- "[name]" [phone] D-l-[id number] Recall 3/3          Assessment & Plan:

## 2017-05-30 DIAGNOSIS — D692 Other nonthrombocytopenic purpura: Secondary | ICD-10-CM | POA: Diagnosis not present

## 2017-05-30 DIAGNOSIS — L578 Other skin changes due to chronic exposure to nonionizing radiation: Secondary | ICD-10-CM | POA: Diagnosis not present

## 2017-05-30 DIAGNOSIS — L820 Inflamed seborrheic keratosis: Secondary | ICD-10-CM | POA: Diagnosis not present

## 2017-05-30 DIAGNOSIS — L821 Other seborrheic keratosis: Secondary | ICD-10-CM | POA: Diagnosis not present

## 2017-05-30 DIAGNOSIS — L570 Actinic keratosis: Secondary | ICD-10-CM | POA: Diagnosis not present

## 2017-05-30 DIAGNOSIS — Z1283 Encounter for screening for malignant neoplasm of skin: Secondary | ICD-10-CM | POA: Diagnosis not present

## 2017-05-30 DIAGNOSIS — D225 Melanocytic nevi of trunk: Secondary | ICD-10-CM | POA: Diagnosis not present

## 2017-05-30 DIAGNOSIS — Z85820 Personal history of malignant melanoma of skin: Secondary | ICD-10-CM | POA: Diagnosis not present

## 2017-05-30 DIAGNOSIS — D1801 Hemangioma of skin and subcutaneous tissue: Secondary | ICD-10-CM | POA: Diagnosis not present

## 2017-05-30 DIAGNOSIS — L853 Xerosis cutis: Secondary | ICD-10-CM | POA: Diagnosis not present

## 2017-05-30 DIAGNOSIS — L812 Freckles: Secondary | ICD-10-CM | POA: Diagnosis not present

## 2017-05-30 DIAGNOSIS — Z85828 Personal history of other malignant neoplasm of skin: Secondary | ICD-10-CM | POA: Diagnosis not present

## 2017-06-07 ENCOUNTER — Telehealth: Payer: Self-pay | Admitting: Internal Medicine

## 2017-06-07 NOTE — Telephone Encounter (Signed)
Pt is requesting a copy of labs from his 1/29 appointment mailed to him, if possible.

## 2017-06-07 NOTE — Telephone Encounter (Signed)
I mailed him the labs from 04-29-17.

## 2017-06-14 DIAGNOSIS — H5000 Unspecified esotropia: Secondary | ICD-10-CM | POA: Diagnosis not present

## 2017-06-21 DIAGNOSIS — H532 Diplopia: Secondary | ICD-10-CM | POA: Diagnosis not present

## 2017-08-04 ENCOUNTER — Encounter (HOSPITAL_COMMUNITY): Payer: Self-pay | Admitting: *Deleted

## 2017-08-04 ENCOUNTER — Ambulatory Visit (HOSPITAL_COMMUNITY)
Admission: EM | Admit: 2017-08-04 | Discharge: 2017-08-04 | Disposition: A | Discharge status: Home / Self Care | Payer: PPO | Attending: Internal Medicine | Admitting: Internal Medicine

## 2017-08-04 DIAGNOSIS — S30860A Insect bite (nonvenomous) of lower back and pelvis, initial encounter: Secondary | ICD-10-CM | POA: Diagnosis not present

## 2017-08-04 DIAGNOSIS — W57XXXA Bitten or stung by nonvenomous insect and other nonvenomous arthropods, initial encounter: Secondary | ICD-10-CM | POA: Diagnosis not present

## 2017-08-04 MED ORDER — DOXYCYCLINE HYCLATE 100 MG PO CAPS: ORAL_CAPSULE | ORAL | 0 refills | Status: DC

## 2017-08-04 NOTE — ED Triage Notes (Addendum)
Just found out this morning on the right side of his butt, per pt his wife tried to take it out but is broke off, per pt he was working in his yard yesterday

## 2017-08-04 NOTE — Discharge Instructions (Addendum)
Prescribed single prophylactic dose of doxycycline 200 mg To prevent tick bites, wear long sleeves, long pants, and light colors. Use insect repellent. Follow the instructions on the bottle. If the tick is biting, do not try to remove it with heat, alcohol, petroleum jelly, or fingernail polish. Use tweezers, curved forceps, or a tick-removal tool to grasp the tick. Gently pull up until the tick lets go. Do not twist or jerk the tick. Do not squeeze or crush the tick. Return here or go to ER if you have any new or worsening symptoms (rash, nausea, vomiting, fever, chills, headache, fatigue 

## 2017-08-04 NOTE — ED Provider Notes (Signed)
Cody Willis    CSN: [id number] Arrival date & time: 08/04/17  1234     History   Chief Complaint Chief Complaint  Patient presents with  . Tick Removal    HPI Cody Willis is a 77 y.o. male.   Patient presents post tick removal from right buttocks this morning.  States he has been working in the yard since Thursday.  Wife noticed tick on his right buttocks.  Wife attempted to remove tick, but states the head broke off.  He denies skin changes, rash, fever, chills, nausea, vomiting, chest pain , SOB, fatigue, headache.       Past Medical History:  Diagnosis Date  . Chronic pancreatitis ([place])   . Esophageal reflux   . Essential hypertension, benign   . Gastric polyps   . Hiatal hernia   . Other B-complex deficiencies   . Pure hypercholesterolemia   . Stroke ([place]) 01/16/2015   Pt. started on plavix    Patient Active Problem List   Diagnosis Date Noted  . Acute non-recurrent maxillary sinusitis 04/29/2017  . Proteinuria 04/29/2017  . Epididymitis 09/17/2016  . Testicular pain 01/09/2016  . Former smoker 10/31/2015  . Preventative health care 07/26/2015  . Advance directive discussed with patient 07/26/2015  . Pain in hand 07/25/2015  . Occipital neuralgia of right side 05/02/2015  . History of CVA (cerebrovascular accident) 03/21/2015  . Smoker 03/21/2015  . Essential hypertension 03/21/2015  . Stroke ([place]) 01/17/2015  . HLD (hyperlipidemia)   . COPD (chronic obstructive pulmonary disease) ([place]) 08/17/2014  . CHRONIC PANCREATITIS 01/03/2010  . B12 DEFICIENCY 05/13/2009  . ESSENTIAL HYPERTENSION, BENIGN 03/22/2009  . HYPERCHOLESTEROLEMIA 07/02/2007  . ACID REFLUX DISEASE 07/02/2007    Past Surgical History:  Procedure Laterality Date  . APPENDECTOMY    . BACK SURGERY     Diskectomy between L4 and L5  . CATARACT EXTRACTION W/ INTRAOCULAR LENS  IMPLANT, BILATERAL  12/15 and 2/16  . CHOLECYSTECTOMY    . MELANOMA EXCISION  ~2009   right upper  abdomen       Home Medications    Prior to Admission medications   Medication Sig Start Date End Date Taking? Authorizing Provider  albuterol (PROVENTIL HFA;VENTOLIN HFA) 108 (90 Base) MCG/ACT inhaler 2 puffs inhaled every 6 hours x 36 hours then as needed for wheeze and cough 05/04/17   Young, Vanessa [place], PA-C  aspirin EC 325 MG tablet Take 1 tablet (325 mg total) by mouth daily. 07/25/15   Rosalin Hawking, MD  azithromycin (ZITHROMAX) 250 MG tablet Take 1 tablet (250 mg total) by mouth daily. Take first 2 tablets together, then 1 every day until finished. 05/04/17   Bjorn Pippin, PA-C  cyanocobalamin (,VITAMIN B-12,) 1000 MCG/ML injection INJECT 1ML INTO THE MUSCLE EVERY 30 DAYS 05/07/17   Pyrtle, Lajuan Lines, MD  doxycycline (VIBRAMYCIN) 100 MG capsule Take 2 capsules (200 mg total) by mouth 1 (one) time daily 08/04/17   Arabel Barcenas, [place], PA-C  loperamide (IMODIUM A-D) 2 MG tablet Take 2 mg by mouth as needed for diarrhea or loose stools.     [provider]  lovastatin (MEVACOR) 40 MG tablet TAKE 1 TABLET BY MOUTH AT BEDTIME 05/07/17   Venia Carbon, MD  Pancrelipase, Lip-Prot-Amyl, ([name]) 25000 units CPEP Take 2 capsules by mouth 3 (three) times daily with meals. 11/26/16   Pyrtle, Lajuan Lines, MD  pantoprazole (PROTONIX) 40 MG tablet Take 1 tablet (40 mg total) by mouth every morning. 11/26/16  Jerene Bears, MD  traMADol Veatrice Bourbon) 50 MG tablet  11/12/16   [provider]    Family History Family History  Problem Relation Age of Onset  . Stroke Mother   . Crohn's disease Sister   . Colitis Brother   . Colon cancer Neg Hx     Social History Social History   Tobacco Use  . Smoking status: Former Smoker    Packs/day: 1.00    Types: Cigarettes    Last attempt to quit: 01/16/2015    Years since quitting: 2.5  . Smokeless tobacco: Never Used  . Tobacco comment: Counseling sheet given in exam room to quit smoking   Substance Use Topics  . Alcohol use: No    Alcohol/week:  0.0 oz  . Drug use: No     Allergies   Plavix [clopidogrel bisulfate] and Sulfamethoxazole-trimethoprim   Review of Systems Review of Systems  Constitutional: Negative for chills, fatigue and fever.  Respiratory: Negative for shortness of breath.   Cardiovascular: Negative for chest pain.  Gastrointestinal: Negative for constipation, diarrhea, nausea and vomiting.  Skin: Negative for rash.  Neurological: Negative for headaches.     Physical Exam Triage Vital Signs ED Triage Vitals [08/04/17 1353]  Enc Vitals Group     BP 136/73     Pulse Rate 67     Resp      Temp 97.6 F (36.4 C)     Temp Source Oral     SpO2 99 %     Weight      Height      Head Circumference      Peak Flow      Pain Score 0     Pain Loc      Pain Edu?      Excl. in [place]?    No data found.  Updated Vital Signs BP 136/73 (BP Location: Left Arm)   Pulse 67   Temp 97.6 F (36.4 C) (Oral)   SpO2 99%   Visual Acuity Right Eye Distance:   Left Eye Distance:   Bilateral Distance:    Right Eye Near:   Left Eye Near:    Bilateral Near:     Physical Exam  Constitutional: He is oriented to person, place, and time. He appears well-developed and well-nourished. No distress.  HENT:  Head: Normocephalic and atraumatic.  Right Ear: External ear normal.  Left Ear: External ear normal.  Nose: Nose normal.  Eyes: EOM are normal.  Neck: Normal range of motion.  Cardiovascular: Normal rate, regular rhythm and normal heart sounds. Exam reveals no friction rub.  No murmur heard. Radial pulse 2+ bilaterally    Pulmonary/Chest: Effort normal and breath sounds normal. No stridor. He has no wheezes. He has no rales.  Neurological: He is alert and oriented to person, place, and time.  Skin: Skin is warm and dry. Capillary refill takes less than 2 seconds. He is not diaphoretic.  Small punctate lesion on right lateral buttock.  Remaining portion of tick removed with forceps in office.   Psychiatric: He  has a normal mood and affect. His behavior is normal. Judgment and thought content normal.     UC Treatments / Results  Labs (all labs ordered are listed, but only abnormal results are displayed) Labs Reviewed - No data to display  EKG None Radiology No results found.  Procedures Procedures (including critical care time)  Medications Ordered in UC Medications - No data to display   Initial Impression /  Assessment and Plan / UC Course  I have reviewed the triage vital signs and the nursing notes.  Pertinent labs & imaging results that were available during my care of the patient were reviewed by me and considered in my medical decision making (see chart for details).     Patient complains of tick removal today from right buttock.  States he has been working in the yard since Thursday, and is unsure how long the tick has been there.  Partial removal of tick performed by wife at home.  Remainder removed in office with forceps.  Band-Aid placed.  Prescribed a single prophylactic dose of 200mg  of doxycyclin.  Return and ER precautions given.  Final Clinical Impressions(s) / UC Diagnoses   Final diagnoses:  Tick bite of back, initial encounter    ED Discharge Orders        Ordered    doxycycline (VIBRAMYCIN) 100 MG capsule     08/04/17 1421       Controlled Substance Prescriptions [place] Controlled Substance Registry consulted? Not Applicable   Lestine Box, [place] 08/04/17 1432

## 2017-08-22 ENCOUNTER — Telehealth: Payer: Self-pay | Admitting: Internal Medicine

## 2017-08-22 ENCOUNTER — Other Ambulatory Visit: Payer: Self-pay | Admitting: Internal Medicine

## 2017-08-23 MED ORDER — CYANOCOBALAMIN 1000 MCG/ML IJ SOLN: INTRAMUSCULAR | 0 refills | Status: DC

## 2017-08-23 NOTE — Telephone Encounter (Signed)
Patient has scheduled an appointment for 09/16/17 at 11:00 am.  I advised I will send B12 to get him through until that time.

## 2017-08-23 NOTE — Telephone Encounter (Signed)
I have left a voicemail for patient to call back. 

## 2017-09-05 ENCOUNTER — Encounter: Payer: Self-pay | Admitting: *Deleted

## 2017-09-16 ENCOUNTER — Encounter: Payer: Self-pay | Admitting: Internal Medicine

## 2017-09-16 ENCOUNTER — Ambulatory Visit (INDEPENDENT_AMBULATORY_CARE_PROVIDER_SITE_OTHER): Payer: PPO | Admitting: Internal Medicine

## 2017-09-16 VITALS — BP 112/74 | HR 68 | Ht 65.5 in | Wt 180.2 lb

## 2017-09-16 DIAGNOSIS — K219 Gastro-esophageal reflux disease without esophagitis: Secondary | ICD-10-CM | POA: Diagnosis not present

## 2017-09-16 DIAGNOSIS — K861 Other chronic pancreatitis: Secondary | ICD-10-CM | POA: Diagnosis not present

## 2017-09-16 DIAGNOSIS — Z8719 Personal history of other diseases of the digestive system: Secondary | ICD-10-CM

## 2017-09-16 MED ORDER — CYANOCOBALAMIN 1000 MCG/ML IJ SOLN: INTRAMUSCULAR | 11 refills | Status: DC

## 2017-09-16 MED ORDER — PANCRELIPASE (LIP-PROT-AMYL) 25000-79000 UNITS PO CPEP: 2.0000 | ORAL_CAPSULE | Freq: Three times a day (TID) | ORAL | 11 refills | Status: DC

## 2017-09-16 MED ORDER — PANTOPRAZOLE SODIUM 40 MG PO TBEC: 40.0000 mg | DELAYED_RELEASE_TABLET | Freq: Every morning | ORAL | 11 refills | Status: DC

## 2017-09-16 NOTE — Patient Instructions (Signed)
We have sent the following medications to your pharmacy for you to pick up at your convenience: Pantoprazole zenpep B12 injections  Please follow up with Dr Hilarie Fredrickson in 2 years.  If you are age 76 or older, your body mass index should be between 23-30. Your Body mass index is 29.54 kg/m. If this is out of the aforementioned range listed, please consider follow up with your Primary Care Provider.  If you are age 27 or younger, your body mass index should be between 19-25. Your Body mass index is 29.54 kg/m. If this is out of the aformentioned range listed, please consider follow up with your Primary Care Provider.

## 2017-09-16 NOTE — Progress Notes (Signed)
Subjective:    Patient ID: Cody Willis, male    DOB: 1940-12-14, 77 y.o.   MRN: [id number]  HPI Cody Willis is a 77 year old  male with a past medical history of chronic idiopathic pancreatitis, hyperplastic gastric polyp, GERD, B12 deficiency, hypertension, hyperlipidemia and history of stroke is here for follow-up.  Last seen in the time of surveillance upper endoscopy on 11/26/2016 and in the office on 03/13/2016.  He reports that he has been doing well.  He wonders if he can reduce [name] because the medication is expensive.  He is taking 1 to 2 capsules 3 times a day.  For the most part he feels that this does help control his loose stools.  Occasionally he will have loose stools if he eats larger meals and if so he will use Imodium with success.  No abdominal pain.  No early satiety, nausea or vomiting.  No blood in his stool or melena.  He has continue pantoprazole 40 mg daily which he states works very well.  He is tried going without this medication but has recurrent heartburn and dyspepsia.  No dysphagia or odynophagia.  Last endoscopy performed 11 months ago revealed a 2 cm hiatal hernia, a 10 to 12 mm sessile polyp on the lesser curve of the gastric antrum which was biopsied and unchanged.  And at 10 to 12 mm polyp in the duodenal bulb which was biopsied and unchanged.  The antral polyp is hyperplastic without dysplasia.  No H. pylori.  No metaplasia.  Duodenal bulb polyp was gastric type mucosa with mild inflammation and reactive changes.  No dysplasia.  He also continues B12 injections once monthly which he says definitively helps with his energy levels.  His last colonoscopy was in 2011 and without polyps.   Review of Systems As per HPI, otherwise negative  Current Medications, Allergies, Past Medical History, Past Surgical History, Family History and Social History were reviewed in [employer] record.     Objective:   Physical Exam BP 112/74 (BP  Location: Left Arm, Patient Position: Sitting, Cuff Size: Normal)   Pulse 68   Ht 5' 5.5" (1.664 m)   Wt 180 lb 4 oz (81.8 kg)   BMI 29.54 kg/m  Constitutional: Well-developed and well-nourished. No distress. HEENT: Normocephalic and atraumatic. Oropharynx is clear and moist. Conjunctivae are normal.  No scleral icterus. Neck: Neck supple. Trachea midline. Cardiovascular: Normal rate, regular rhythm and intact distal pulses. No M/R/G Pulmonary/chest: Effort normal and breath sounds normal. No wheezing, rales or rhonchi. Abdominal: Soft, nontender, nondistended. Bowel sounds active throughout. There are no masses palpable. No hepatosplenomegaly. Extremities: no clubbing, cyanosis, or edema Neurological: Alert and oriented to person place and time. Skin: Skin is warm and dry. Psychiatric: Normal mood and affect. Behavior is normal.       Assessment & Plan:  77 year old  male with a past medical history of chronic idiopathic pancreatitis, hyperplastic gastric polyp, GERD, B12 deficiency, hypertension, hyperlipidemia and history of stroke is here for follow-up.  1.  GERD --complete symptom control with daily pantoprazole.  He has tried going without this medication and feels that he needs it on a daily basis.  Risks and benefits of chronic PPI reviewed and we will continue therapy at 40 mg daily.  2.  Hyperplastic gastric polyp --unchanged at surveillance in August 2018.  Can consider looking at this lesion again next year though if it remains stable further surveillance should not be needed.  No evidence of  H. pylori.  No dysplasia.  3.  Chronic pancreatitis --idiopathic.  He would like to experiment without [name] to see if he has worsening of his loose stools.  I made him aware of the possible side symptoms of pancreatic insufficiency including abdominal cramping, including abdominal pain, loose stools, gas and bloating.  If this happens without [name] that he should resume 1 to 2 tablets  with meals.  He can use loperamide per box instruction.  4.  CRC screening --normal colonoscopy in 2011.  Consider repeat in 2021.  25 minutes spent with the patient today. Greater than 50% was spent in counseling and coordination of care with the patient  Annual follow-up, sooner if needed

## 2017-10-02 IMAGING — RF DG ESOPHAGUS
13 series · 21 of 24 positions shown · non-contrast
Comparison: Chest radiograph 01/16/2015

CLINICAL DATA: Dysphagia with foods and pills getting stuck.
Esophagus dilated September 2015.

EXAM:
ESOPHOGRAM / BARIUM SWALLOW / BARIUM TABLET STUDY
TECHNIQUE: Combined double contrast and single contrast examination performed
using effervescent crystals, thick barium liquid, and thin barium
liquid. The patient was observed with fluoroscopy swallowing a 13 mm
barium sulphate tablet.
FLUOROSCOPY TIME:  Fluoroscopy Time:  2.3 minutes
Radiation Exposure Index (if provided by the fluoroscopic device):
12.9 mGy
Number of Acquired Spot Images: 0

[Series 1: cp_standard · 0.34mm/px · 2 of 15 frames shown (1 of 13)]
[frame 3/15]
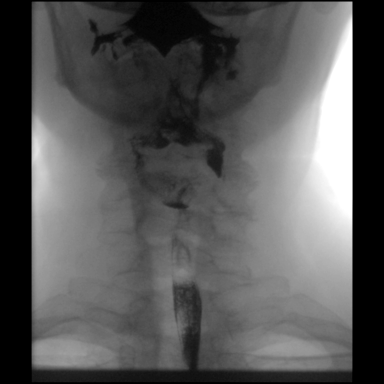
[frame 13/15]
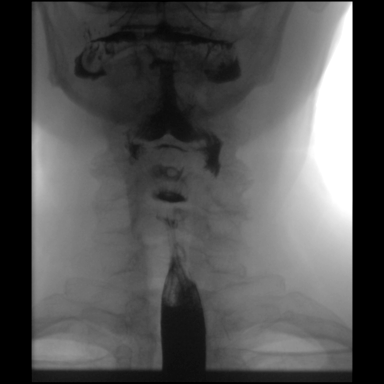

[Series 2: cp_standard · 0.34mm/px · 1 of 62 frames shown (2 of 13)]
[frame 10/62]
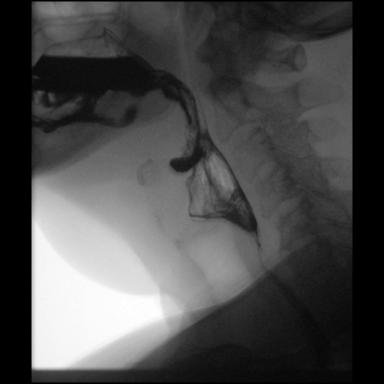

[Series 3: cp_standard · 0.34mm/px · 2 of 6 frames shown (3 of 13)]
[frame 4/6]
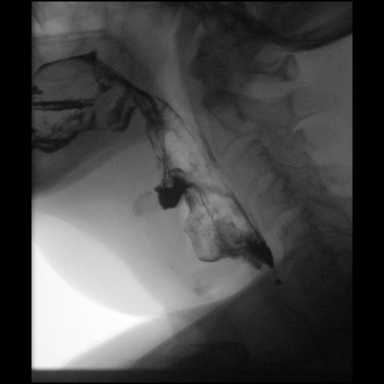
[frame 6/6]
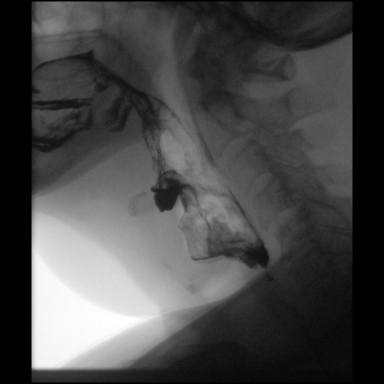

[Series 4: cp_standard · 0.34mm/px · 2 of 66 frames shown (4 of 13)]
[frame 10/66]
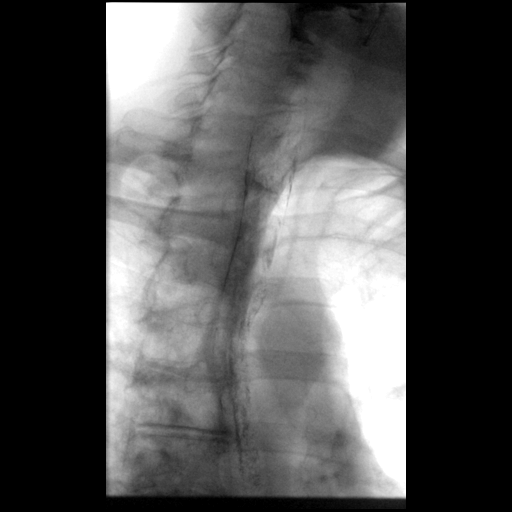
[frame 57/66]
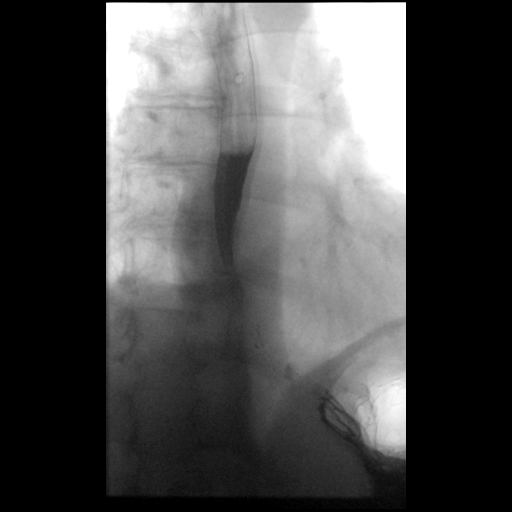

[Series 5: cp_standard · 0.35mm/px · 1 of 53 frames shown (5 of 13)]
[frame 27/53]
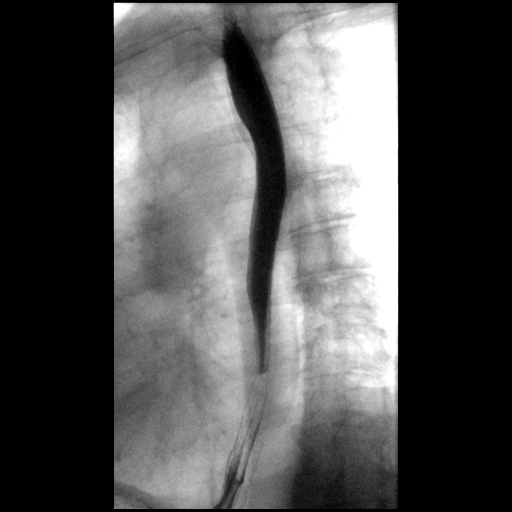

[Series 6: cp_standard · 0.35mm/px · 2 of 46 frames shown (6 of 13)]
[frame 7/46]
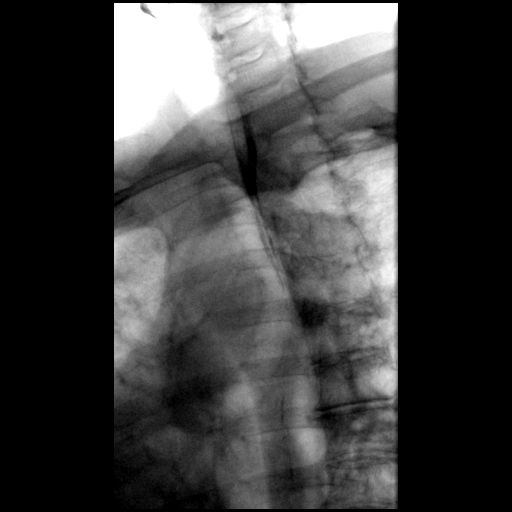
[frame 40/46]
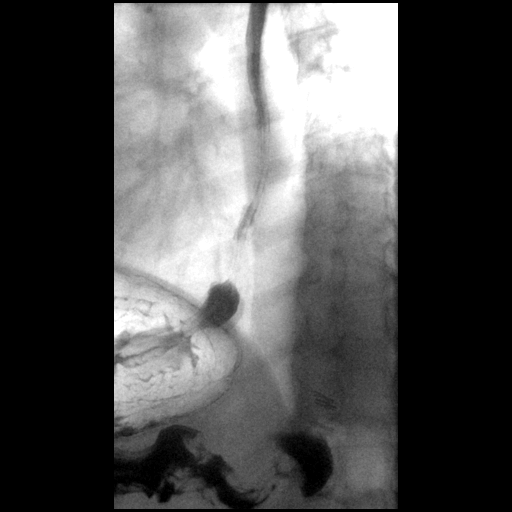

[Series 7: cp_standard · 0.35mm/px · 1 of 48 frames shown (7 of 13)]
[frame 41/48]
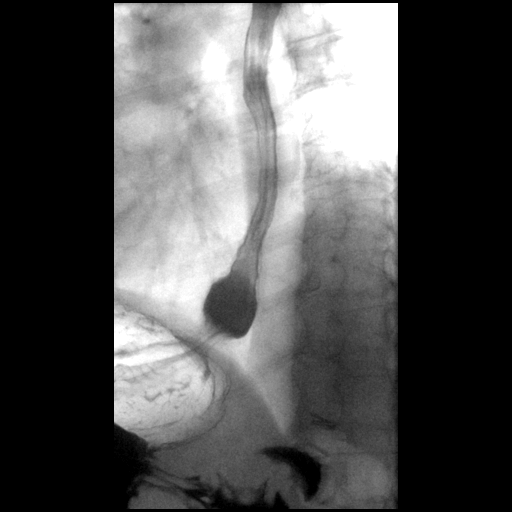

[Series 8: cp_standard · 0.35mm/px · 2 of 50 frames shown (8 of 13)]
[frame 8/50]
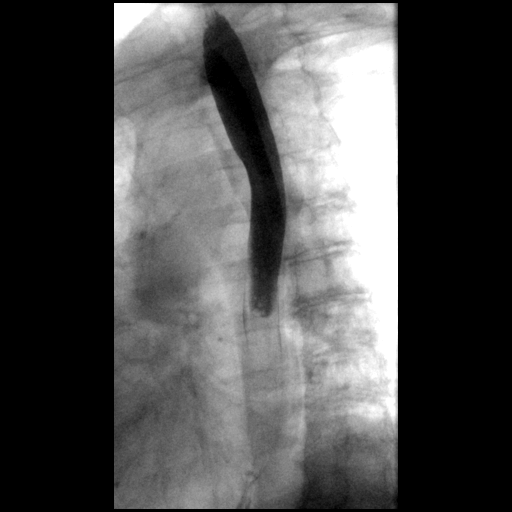
[frame 39/50]
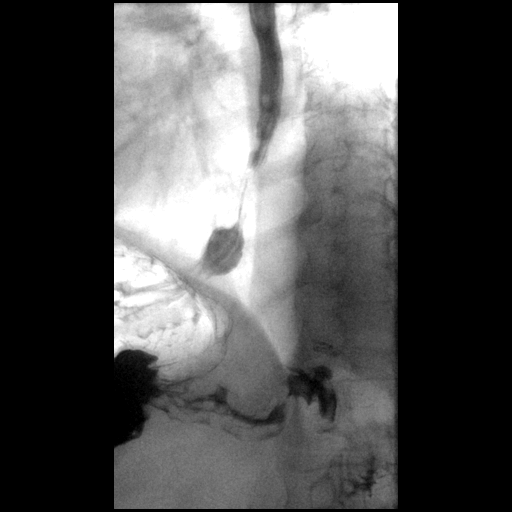

[Series 9: cp_standard · 0.36mm/px · 2 of 10 frames shown (9 of 13)]
[frame 6/10]
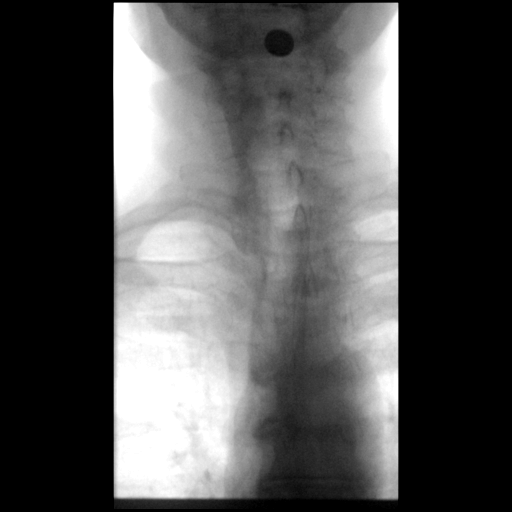
[frame 9/10]
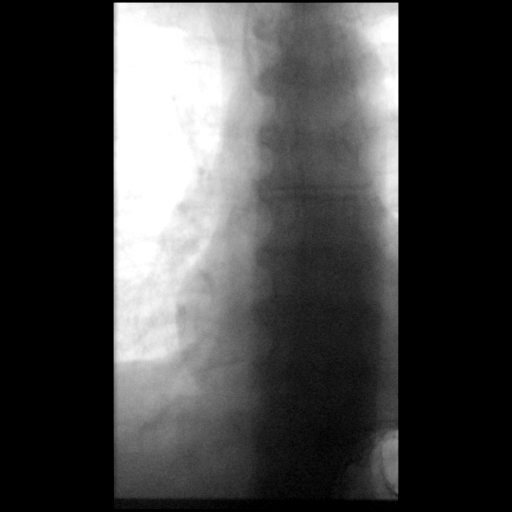

[Series 10: cp_standard · 0.35mm/px · 1 of 12 frames shown (10 of 13)]
[frame 7/12]
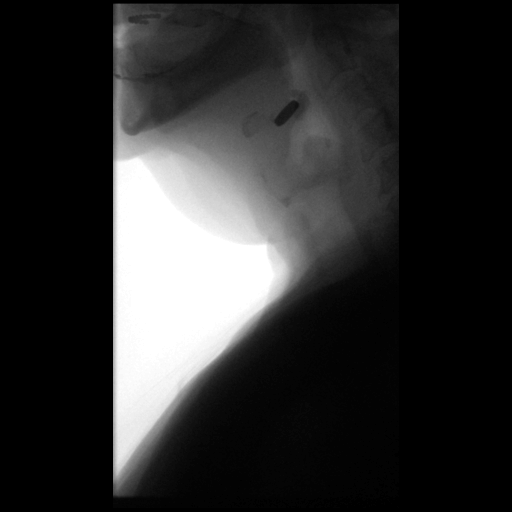

[Series 11: cp_standard · 0.35mm/px · 1 of 24 frames shown (11 of 13)]
[frame 4/24]
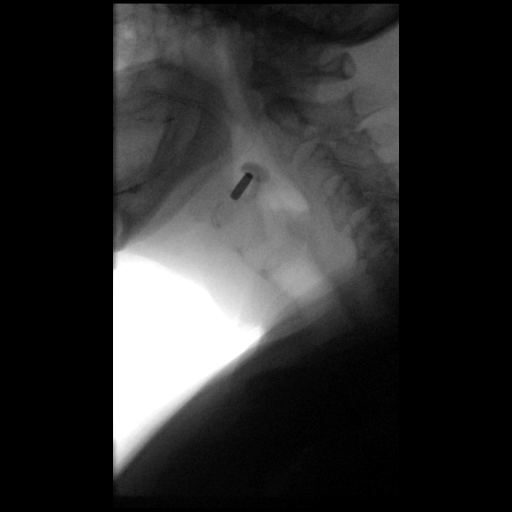

[Series 12: cp_standard · 0.35mm/px · 2 of 39 frames shown (12 of 13)]
[frame 6/39]
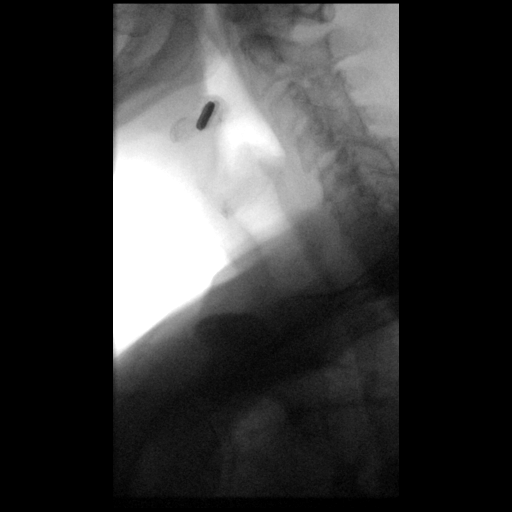
[frame 34/39]
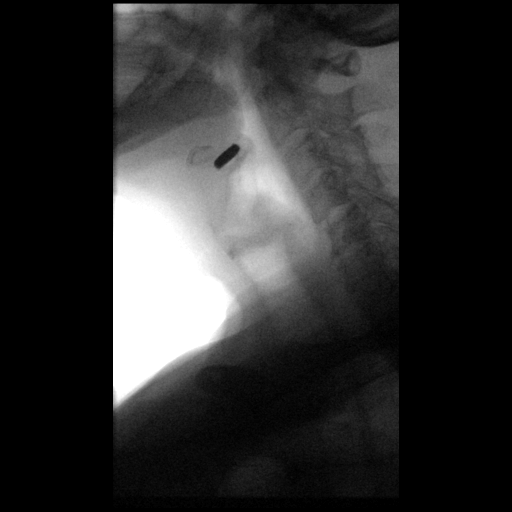

[Series 13: cp_standard · 0.35mm/px · 2 of 44 frames shown (13 of 13)]
[frame 21/44]
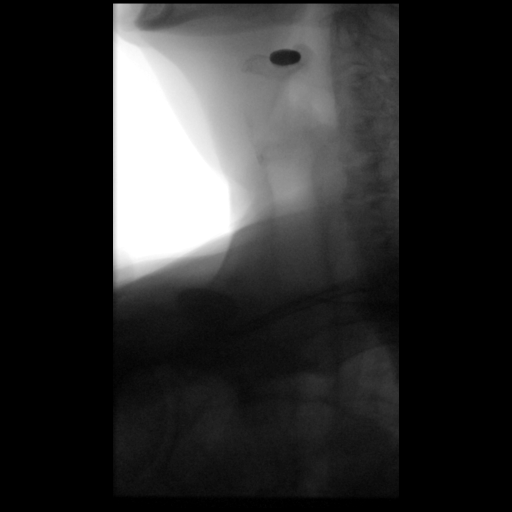
[frame 38/44]
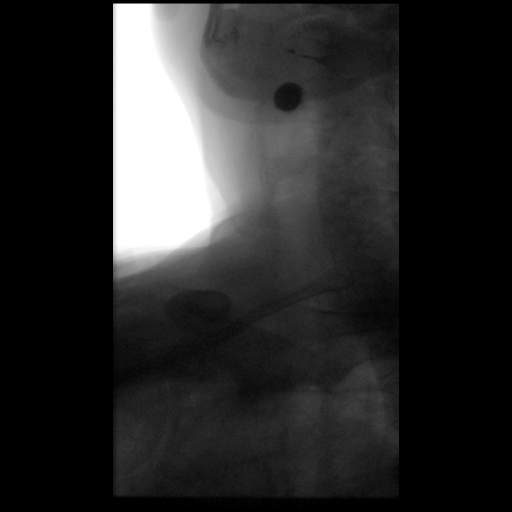

[21 of 24 positions shown; findings below may reference images not displayed]

FINDINGS: The pharyngeal phase of swallowing demonstrates a prominent
cricopharyngeus muscle and [place] diverticulum. There is
pooling of contrast especially in the vallecula. When the patient
swallowed a barium pill, it got stuck in the vallecula and did not
pass distally despite multiple subsequent swallows of water.

Small type 1 hiatal hernia. Subtle fold thickening in the distal
esophageal mucosal folds. Primary peristaltic waves in the esophagus
were normal on [DATE] swallows.

No esophageal stricture is identified.  No esophageal ulceration.

Thoracic spondylosis noted.
IMPRESSION: 1. There is pooling of contrast in the vallecula after swallowing. A
13 mm barium pill got stuck in the vallecula and did not pass
despite multiple swallows of water. This recreated the patient's
globus sensation.
2. Small type 1 hiatal hernia. No esophageal stricture currently
identified. Primary peristaltic waves in the esophagus were normal.
3. Subtle full thickening in the distal esophagitis could reflect
low-grade esophagitis.

## 2017-10-17 ENCOUNTER — Ambulatory Visit (INDEPENDENT_AMBULATORY_CARE_PROVIDER_SITE_OTHER): Payer: PPO | Admitting: Otolaryngology

## 2017-10-17 DIAGNOSIS — H6123 Impacted cerumen, bilateral: Secondary | ICD-10-CM | POA: Diagnosis not present

## 2017-10-17 DIAGNOSIS — R1312 Dysphagia, oropharyngeal phase: Secondary | ICD-10-CM

## 2017-11-22 DIAGNOSIS — H532 Diplopia: Secondary | ICD-10-CM | POA: Diagnosis not present

## 2017-12-12 ENCOUNTER — Encounter (HOSPITAL_COMMUNITY): Payer: Self-pay | Admitting: *Deleted

## 2017-12-12 ENCOUNTER — Ambulatory Visit (HOSPITAL_COMMUNITY)
Admission: EM | Admit: 2017-12-12 | Discharge: 2017-12-12 | Disposition: A | Discharge status: Home / Self Care | Payer: PPO | Attending: Family Medicine | Admitting: Family Medicine

## 2017-12-12 ENCOUNTER — Other Ambulatory Visit: Payer: Self-pay

## 2017-12-12 DIAGNOSIS — J22 Unspecified acute lower respiratory infection: Secondary | ICD-10-CM | POA: Diagnosis not present

## 2017-12-12 MED ORDER — AZITHROMYCIN 250 MG PO TABS: ORAL_TABLET | ORAL | 0 refills | Status: AC

## 2017-12-12 MED ORDER — BENZONATATE 100 MG PO CAPS: 100.0000 mg | ORAL_CAPSULE | Freq: Three times a day (TID) | ORAL | 0 refills | Status: DC

## 2017-12-12 NOTE — ED Triage Notes (Signed)
C/o productive cough and generalized bodyaches onset Monday

## 2017-12-12 NOTE — ED Provider Notes (Signed)
[place]    CSN: [id number] Arrival date & time: 12/12/17  9562     History   Chief Complaint Chief Complaint  Patient presents with  . Cough  . Generalized Body Aches    HPI Cody Willis is a 77 y.o. male.   Cody Willis presents with complaints of productive cough and body aches which started three days ago and has not been improving. Poor sleep due to cough. Denies wheezing, denies shortness of breath, denies chest pain. Has congestion and mild sore throat. No known ill contacts. Denies gi/gu complaints. Has tried using throat lozenges which have helped with cough. No specific known fevers. Denies asthma/copd history. Doesn't smoke. Hx of stroke, pancreatitis, reflux. COPd is listed in [place].      ROS per HPI.      Past Medical History:  Diagnosis Date  . Chronic pancreatitis ([place])   . Esophageal reflux   . Essential hypertension, benign   . Gastric polyps    hyperplastic  . Hiatal hernia   . Other B-complex deficiencies   . Pure hypercholesterolemia   . Stroke ([place]) 01/16/2015   Pt. started on plavix    Patient Active Problem List   Diagnosis Date Noted  . Acute non-recurrent maxillary sinusitis 04/29/2017  . Proteinuria 04/29/2017  . Epididymitis 09/17/2016  . Testicular pain 01/09/2016  . Former smoker 10/31/2015  . Preventative health care 07/26/2015  . Advance directive discussed with patient 07/26/2015  . Pain in hand 07/25/2015  . Occipital neuralgia of right side 05/02/2015  . History of CVA (cerebrovascular accident) 03/21/2015  . Smoker 03/21/2015  . Essential hypertension 03/21/2015  . Stroke ([place]) 01/17/2015  . HLD (hyperlipidemia)   . COPD (chronic obstructive pulmonary disease) ([place]) 08/17/2014  . CHRONIC PANCREATITIS 01/03/2010  . B12 DEFICIENCY 05/13/2009  . ESSENTIAL HYPERTENSION, BENIGN 03/22/2009  . HYPERCHOLESTEROLEMIA 07/02/2007  . ACID REFLUX DISEASE 07/02/2007    Past Surgical History:  Procedure Laterality Date    . APPENDECTOMY    . BACK SURGERY     Diskectomy between L4 and L5  . CATARACT EXTRACTION W/ INTRAOCULAR LENS  IMPLANT, BILATERAL  12/15 and 2/16  . CHOLECYSTECTOMY    . MELANOMA EXCISION  ~2009   right upper abdomen       Home Medications    Prior to Admission medications   Medication Sig Start Date End Date Taking? Authorizing Provider  aspirin EC 325 MG tablet Take 1 tablet (325 mg total) by mouth daily. 07/25/15   Rosalin Hawking, MD  azithromycin (ZITHROMAX) 250 MG tablet Take 2 tablets (500 mg total) by mouth daily for 1 day, THEN 1 tablet (250 mg total) daily for 4 days. 12/12/17 12/17/17  Zigmund Gottron, NP  benzonatate (TESSALON) 100 MG capsule Take 1 capsule (100 mg total) by mouth every 8 (eight) hours. 12/12/17   Augusto Gamble B, NP  cyanocobalamin (,VITAMIN B-12,) 1000 MCG/ML injection INJECT 1ML INTO THE MUSCLE EVERY 30 DAYS 09/16/17   Pyrtle, Lajuan Lines, MD  loperamide (IMODIUM A-D) 2 MG tablet Take 2 mg by mouth as needed for diarrhea or loose stools.     [provider]  lovastatin (MEVACOR) 40 MG tablet TAKE 1 TABLET BY MOUTH AT BEDTIME 05/07/17   Viviana Simpler I, MD  Pancrelipase, Lip-Prot-Amyl, ([name]) 25000-79000 units CPEP Take 2 capsules by mouth 3 (three) times daily with meals. 09/16/17   Pyrtle, Lajuan Lines, MD  pantoprazole (PROTONIX) 40 MG tablet Take 1 tablet (40 mg total) by mouth  every morning. 09/16/17   Pyrtle, Lajuan Lines, MD  traMADol Veatrice Bourbon) 50 MG tablet  11/12/16   [provider]    Family History Family History  Problem Relation Age of Onset  . Stroke Mother   . Crohn's disease Sister   . Colitis Brother   . Colon cancer Neg Hx     Social History Social History   Tobacco Use  . Smoking status: Former Smoker    Packs/day: 1.00    Types: Cigarettes    Last attempt to quit: 01/16/2015    Years since quitting: 2.9  . Smokeless tobacco: Never Used  . Tobacco comment: Counseling sheet given in exam room to quit smoking   Substance Use Topics  .  Alcohol use: No    Alcohol/week: 0.0 standard drinks  . Drug use: No     Allergies   Plavix [clopidogrel bisulfate] and Sulfamethoxazole-trimethoprim   Review of Systems Review of Systems   Physical Exam Triage Vital Signs ED Triage Vitals  Enc Vitals Group     BP 12/12/17 0914 112/85     Pulse Rate 12/12/17 0914 92     Resp 12/12/17 0914 20     Temp 12/12/17 0914 98.3 F (36.8 C)     Temp Source 12/12/17 0914 Oral     SpO2 12/12/17 0914 95 %     Weight --      Height --      Head Circumference --      Peak Flow --      Pain Score 12/12/17 0915 10     Pain Loc --      Pain Edu? --      Excl. in [place]? --    No data found.  Updated Vital Signs BP 112/85 (BP Location: Left Arm)   Pulse 92   Temp 98.3 F (36.8 C) (Oral)   Resp 20   SpO2 95%    Physical Exam  Constitutional: He is oriented to person, place, and time. He appears well-developed and well-nourished.  HENT:  Head: Normocephalic and atraumatic.  Right Ear: Tympanic membrane, external ear and ear canal normal.  Left Ear: Tympanic membrane, external ear and ear canal normal.  Nose: Nose normal. Right sinus exhibits no maxillary sinus tenderness and no frontal sinus tenderness. Left sinus exhibits no maxillary sinus tenderness and no frontal sinus tenderness.  Mouth/Throat: Uvula is midline, oropharynx is clear and moist and mucous membranes are normal.  Eyes: Pupils are equal, round, and reactive to light. Conjunctivae are normal.  Neck: Normal range of motion.  Cardiovascular: Normal rate and regular rhythm.  Pulmonary/Chest: Effort normal. No accessory muscle usage. No tachypnea. No respiratory distress. He has decreased breath sounds.  mily decreased lung sounds throughout; no increased work of breathing  Lymphadenopathy:    He has no cervical adenopathy.  Neurological: He is alert and oriented to person, place, and time.  Skin: Skin is warm and dry.  Vitals reviewed.    UC Treatments / Results    Labs (all labs ordered are listed, but only abnormal results are displayed) Labs Reviewed - No data to display  EKG None  Radiology No results found.  Procedures Procedures (including critical care time)  Medications Ordered in UC Medications - No data to display  Initial Impression / Assessment and Plan / UC Course  I have reviewed the triage vital signs and the nursing notes.  Pertinent labs & imaging results that were available during my care of the patient  were reviewed by me and considered in my medical decision making (see chart for details).     Course of azithromycin and tessalon provided for this 77 yo male with persistent productive cough and body aches. No respiratory distress at this time. Afebrile today. No tachycardia or tachypnea. Encouraged close follow up with PCP. Return precautions provided. Patient verbalized understanding and agreeable to plan.  Ambulatory out of clinic without difficulty.   Final Clinical Impressions(s) / UC Diagnoses   Final diagnoses:  Lower respiratory tract infection     Discharge Instructions     Push fluids to ensure adequate hydration and keep secretions thin.  Complete course of antibiotics.   Tessalon as needed for cough. You may also use mucinex as an expectorant.  Please follow up with your primary care provider for recheck in the next week.  If develop worsening of shortness of breath, chest pain , difficulty breathing, wheezing, please return or go to the Er.    ED Prescriptions    Medication Sig Dispense Auth. Provider   azithromycin (ZITHROMAX) 250 MG tablet Take 2 tablets (500 mg total) by mouth daily for 1 day, THEN 1 tablet (250 mg total) daily for 4 days. 6 tablet Augusto Gamble B, NP   benzonatate (TESSALON) 100 MG capsule Take 1 capsule (100 mg total) by mouth every 8 (eight) hours. 21 capsule Zigmund Gottron, NP     Controlled Substance Prescriptions [place] Controlled Substance Registry consulted? Not  Applicable   Zigmund Gottron, NP 12/12/17 1001

## 2017-12-12 NOTE — Discharge Instructions (Signed)
Push fluids to ensure adequate hydration and keep secretions thin.  Complete course of antibiotics.   Tessalon as needed for cough. You may also use mucinex as an expectorant.  Please follow up with your primary care provider for recheck in the next week.  If develop worsening of shortness of breath, chest pain , difficulty breathing, wheezing, please return or go to the Er.

## 2017-12-14 ENCOUNTER — Ambulatory Visit (INDEPENDENT_AMBULATORY_CARE_PROVIDER_SITE_OTHER): Payer: PPO

## 2017-12-14 ENCOUNTER — Ambulatory Visit (HOSPITAL_COMMUNITY)
Admission: EM | Admit: 2017-12-14 | Discharge: 2017-12-14 | Disposition: A | Discharge status: Home / Self Care | Payer: PPO | Attending: Internal Medicine | Admitting: Internal Medicine

## 2017-12-14 ENCOUNTER — Encounter (HOSPITAL_COMMUNITY): Payer: Self-pay | Admitting: *Deleted

## 2017-12-14 ENCOUNTER — Other Ambulatory Visit: Payer: Self-pay

## 2017-12-14 DIAGNOSIS — R062 Wheezing: Secondary | ICD-10-CM | POA: Diagnosis not present

## 2017-12-14 DIAGNOSIS — R5383 Other fatigue: Secondary | ICD-10-CM

## 2017-12-14 DIAGNOSIS — R05 Cough: Secondary | ICD-10-CM

## 2017-12-14 DIAGNOSIS — J209 Acute bronchitis, unspecified: Secondary | ICD-10-CM | POA: Diagnosis not present

## 2017-12-14 MED ORDER — ALBUTEROL SULFATE HFA 108 (90 BASE) MCG/ACT IN AERS: 1.0000 | INHALATION_SPRAY | Freq: Four times a day (QID) | RESPIRATORY_TRACT | 0 refills | Status: DC | PRN

## 2017-12-14 MED ORDER — GUAIFENESIN-CODEINE 100-10 MG/5ML PO SOLN: 5.0000 mL | Freq: Every day | ORAL | 0 refills | Status: DC

## 2017-12-14 MED ORDER — GUAIFENESIN-CODEINE 100-10 MG/5ML PO SOLN: 5.0000 mL | Freq: Every day | ORAL | 0 refills | Status: AC

## 2017-12-14 MED ORDER — IPRATROPIUM-ALBUTEROL 0.5-2.5 (3) MG/3ML IN SOLN
RESPIRATORY_TRACT | Status: AC
  Filled 2017-12-14: qty 3

## 2017-12-14 MED ORDER — PREDNISONE 50 MG PO TABS: 50.0000 mg | ORAL_TABLET | Freq: Every day | ORAL | 0 refills | Status: DC

## 2017-12-14 MED ORDER — IPRATROPIUM-ALBUTEROL 0.5-2.5 (3) MG/3ML IN SOLN
3.0000 mL | Freq: Once | RESPIRATORY_TRACT | Status: AC
  Administered 2017-12-14: 3 mL via RESPIRATORY_TRACT

## 2017-12-14 MED ORDER — PREDNISONE 50 MG PO TABS: 50.0000 mg | ORAL_TABLET | Freq: Every day | ORAL | 0 refills | Status: AC

## 2017-12-14 NOTE — Discharge Instructions (Addendum)
Please continue course of azithromycin Begin taking prednisone daily with food for 5 days Use albuterol inhaler as needed for wheezing, chest tightness and shortness of breath Continue Tessalon as needed for cough; may use robitussin ac at bedtime May try mucinex to help break up mucus  Please follow-up with symptoms continuing to not improve, developing fever, shortness of breath, worsening chest discomfort

## 2017-12-14 NOTE — ED Triage Notes (Addendum)
Pt c/o persistent cough, sore throat, and body aches. Pt was seen here on 9/5 and states "the antibiotics are not helping."

## 2017-12-14 NOTE — ED Provider Notes (Signed)
[place]    CSN: [id number] Arrival date & time: 12/14/17  1538     History   Chief Complaint Chief Complaint  Patient presents with  . Cough    HPI Cody Willis is a 77 y.o. male history of hypertension, hyperlipidemia, COPD, presenting today for evaluation of a cough.  Patient states that he has had a cough since Monday.  Cough is been productive with significant amount of phlegm.  Cough has been keeping him up at night, states that he has had only 4 hours of sleep this week.  Cough improved slightly during the day.  Has had some mild rhinorrhea with this as well.  Denies sore throat.  Denies fevers.  Does endorse some wheezing.  Patient is a former smoker, quit approximately 3 years ago, previously a 2 pack/day smoker.  Has had some chest tightness.  Denies history of asthma.  HPI  Past Medical History:  Diagnosis Date  . Chronic pancreatitis ([place])   . Esophageal reflux   . Essential hypertension, benign   . Gastric polyps    hyperplastic  . Hiatal hernia   . Other B-complex deficiencies   . Pure hypercholesterolemia   . Stroke ([place]) 01/16/2015   Pt. started on plavix    Patient Active Problem List   Diagnosis Date Noted  . Acute non-recurrent maxillary sinusitis 04/29/2017  . Proteinuria 04/29/2017  . Epididymitis 09/17/2016  . Testicular pain 01/09/2016  . Former smoker 10/31/2015  . Preventative health care 07/26/2015  . Advance directive discussed with patient 07/26/2015  . Pain in hand 07/25/2015  . Occipital neuralgia of right side 05/02/2015  . History of CVA (cerebrovascular accident) 03/21/2015  . Smoker 03/21/2015  . Essential hypertension 03/21/2015  . Stroke ([place]) 01/17/2015  . HLD (hyperlipidemia)   . COPD (chronic obstructive pulmonary disease) ([place]) 08/17/2014  . CHRONIC PANCREATITIS 01/03/2010  . B12 DEFICIENCY 05/13/2009  . ESSENTIAL HYPERTENSION, BENIGN 03/22/2009  . HYPERCHOLESTEROLEMIA 07/02/2007  . ACID REFLUX DISEASE  07/02/2007    Past Surgical History:  Procedure Laterality Date  . APPENDECTOMY    . BACK SURGERY     Diskectomy between L4 and L5  . CATARACT EXTRACTION W/ INTRAOCULAR LENS  IMPLANT, BILATERAL  12/15 and 2/16  . CHOLECYSTECTOMY    . MELANOMA EXCISION  ~2009   right upper abdomen       Home Medications    Prior to Admission medications   Medication Sig Start Date End Date Taking? Authorizing Provider  aspirin EC 325 MG tablet Take 1 tablet (325 mg total) by mouth daily. 07/25/15  Yes Rosalin Hawking, MD  azithromycin (ZITHROMAX) 250 MG tablet Take 2 tablets (500 mg total) by mouth daily for 1 day, THEN 1 tablet (250 mg total) daily for 4 days. 12/12/17 12/17/17 Yes Burky, Malachy Moan, NP  benzonatate (TESSALON) 100 MG capsule Take 1 capsule (100 mg total) by mouth every 8 (eight) hours. 12/12/17  Yes Burky, Lanelle Bal B, NP  cyanocobalamin (,VITAMIN B-12,) 1000 MCG/ML injection INJECT 1ML INTO THE MUSCLE EVERY 30 DAYS 09/16/17  Yes Pyrtle, Lajuan Lines, MD  loperamide (IMODIUM A-D) 2 MG tablet Take 2 mg by mouth as needed for diarrhea or loose stools.    Yes [provider]  lovastatin (MEVACOR) 40 MG tablet TAKE 1 TABLET BY MOUTH AT BEDTIME 05/07/17  Yes Venia Carbon, MD  Pancrelipase, Lip-Prot-Amyl, ([name]) 25000-79000 units CPEP Take 2 capsules by mouth 3 (three) times daily with meals. 09/16/17  Yes Pyrtle, Ulice Dash  M, MD  pantoprazole (PROTONIX) 40 MG tablet Take 1 tablet (40 mg total) by mouth every morning. 09/16/17  Yes Pyrtle, Lajuan Lines, MD  albuterol (PROVENTIL HFA;VENTOLIN HFA) 108 (90 Base) MCG/ACT inhaler Inhale 1-2 puffs into the lungs every 6 (six) hours as needed for wheezing or shortness of breath. 12/14/17   Wieters, Hallie C, PA-C  guaiFENesin-codeine 100-10 MG/5ML syrup Take 5 mLs by mouth at bedtime for 5 days. 12/14/17 12/19/17  Wieters, Hallie C, PA-C  predniSONE (DELTASONE) 50 MG tablet Take 1 tablet (50 mg total) by mouth daily for 5 days. 12/14/17 12/19/17  Wieters, Elesa Hacker, PA-C    traMADol Veatrice Bourbon) 50 MG tablet  11/12/16   [provider]    Family History Family History  Problem Relation Age of Onset  . Stroke Mother   . Crohn's disease Sister   . Colitis Brother   . Colon cancer Neg Hx     Social History Social History   Tobacco Use  . Smoking status: Former Smoker    Packs/day: 1.00    Types: Cigarettes    Last attempt to quit: 01/16/2015    Years since quitting: 2.9  . Smokeless tobacco: Never Used  . Tobacco comment: Counseling sheet given in exam room to quit smoking   Substance Use Topics  . Alcohol use: No    Alcohol/week: 0.0 standard drinks  . Drug use: No     Allergies   Plavix [clopidogrel bisulfate] and Sulfamethoxazole-trimethoprim   Review of Systems Review of Systems  Constitutional: Positive for fatigue. Negative for activity change, appetite change, chills and fever.  HENT: Positive for congestion and rhinorrhea. Negative for ear pain, sinus pressure, sore throat and trouble swallowing.   Eyes: Negative for discharge and redness.  Respiratory: Positive for cough, chest tightness and wheezing. Negative for shortness of breath.   Cardiovascular: Negative for chest pain.  Gastrointestinal: Negative for abdominal pain, diarrhea, nausea and vomiting.  Musculoskeletal: Negative for myalgias.  Skin: Negative for rash.  Neurological: Negative for dizziness, light-headedness and headaches.     Physical Exam Triage Vital Signs ED Triage Vitals  Enc Vitals Group     BP 12/14/17 1708 129/62     Pulse Rate 12/14/17 1708 78     Resp 12/14/17 1708 20     Temp 12/14/17 1708 97.9 F (36.6 C)     Temp Source 12/14/17 1708 Oral     SpO2 12/14/17 1708 98 %     Weight --      Height --      Head Circumference --      Peak Flow --      Pain Score 12/14/17 1711 3     Pain Loc --      Pain Edu? --      Excl. in [place]? --    No data found.  Updated Vital Signs BP 129/62   Pulse 78   Temp 97.9 F (36.6 C) (Oral)   Resp 20    SpO2 98%   Visual Acuity Right Eye Distance:   Left Eye Distance:   Bilateral Distance:    Right Eye Near:   Left Eye Near:    Bilateral Near:     Physical Exam  Constitutional: He is oriented to person, place, and time. He appears well-developed and well-nourished.  HENT:  Head: Normocephalic and atraumatic.  Mouth/Throat: Oropharynx is clear and moist.  Bilateral ears without tenderness to palpation of external auricle, tragus and mastoid, EAC's without erythema or  swelling, TM's with good bony landmarks and cone of light. Non erythematous.  No rhinorrhea in bilateral nares  Oral mucosa pink and moist, no tonsillar enlargement or exudate. Posterior pharynx patent and erythematous, no uvula deviation or swelling. Normal phonation.  Eyes: Conjunctivae are normal.  Neck: Neck supple.  Cardiovascular: Normal rate and regular rhythm.  No murmur heard. Pulmonary/Chest: Effort normal. No respiratory distress.  Mild expiratory wheezing throughout bilateral lower lung fields; deep breaths occasionally eliciting cough Speaking in full sentences  Abdominal: Soft. There is no tenderness.  Musculoskeletal: He exhibits no edema.  Neurological: He is alert and oriented to person, place, and time.  Skin: Skin is warm and dry.  Psychiatric: He has a normal mood and affect.  Nursing note and vitals reviewed.    UC Treatments / Results  Labs (all labs ordered are listed, but only abnormal results are displayed) Labs Reviewed - No data to display  EKG None  Radiology Dg Chest 2 View  Result Date: 12/14/2017 CLINICAL DATA:  Cough for 5 days. EXAM: CHEST - 2 VIEW COMPARISON:  04/06/2016 FINDINGS: The heart size and mediastinal contours are within normal limits. Stable left basilar scarring. Both lungs are otherwise clear. The visualized skeletal structures are unremarkable. IMPRESSION: No active cardiopulmonary disease. Electronically Signed   By: Earle Gell M.D.   On: 12/14/2017 17:53     Procedures Procedures (including critical care time)  Medications Ordered in UC Medications  ipratropium-albuterol (DUONEB) 0.5-2.5 (3) MG/3ML nebulizer solution 3 mL (3 mLs Nebulization Given 12/14/17 1735)    Initial Impression / Assessment and Plan / UC Course  I have reviewed the triage vital signs and the nursing notes.  Pertinent labs & imaging results that were available during my care of the patient were reviewed by me and considered in my medical decision making (see chart for details).     Chest x-ray negative for pneumonia, likely with acute on chronic bronchitis, will have patient finish course of azithromycin, adding in prednisone, albuterol.  Did provide patient with Robitussin-AC to use only at bedtime to help with sleep and nighttime cough, continue Tessalon during the day.  Discussed trying Mucinex.Discussed strict return precautions. Patient verbalized understanding and is agreeable with plan.  Final Clinical Impressions(s) / UC Diagnoses   Final diagnoses:  Acute bronchitis, unspecified organism     Discharge Instructions     Please continue course of azithromycin Begin taking prednisone daily with food for 5 days Use albuterol inhaler as needed for wheezing, chest tightness and shortness of breath Continue Tessalon as needed for cough; may use robitussin ac at bedtime May try mucinex to help break up mucus  Please follow-up with symptoms continuing to not improve, developing fever, shortness of breath, worsening chest discomfort    ED Prescriptions    Medication Sig Dispense Auth. Provider   predniSONE (DELTASONE) 50 MG tablet  (Status: Discontinued) Take 1 tablet (50 mg total) by mouth daily for 5 days. 5 tablet Wieters, Hallie C, PA-C   albuterol (PROVENTIL HFA;VENTOLIN HFA) 108 (90 Base) MCG/ACT inhaler  (Status: Discontinued) Inhale 1-2 puffs into the lungs every 6 (six) hours as needed for wheezing or shortness of breath. 1 Inhaler Wieters, Hallie  C, PA-C   guaiFENesin-codeine 100-10 MG/5ML syrup  (Status: Discontinued) Take 5 mLs by mouth at bedtime for 5 days. 75 mL Wieters, Hallie C, PA-C   albuterol (PROVENTIL HFA;VENTOLIN HFA) 108 (90 Base) MCG/ACT inhaler Inhale 1-2 puffs into the lungs every 6 (six) hours as needed  for wheezing or shortness of breath. 1 Inhaler Wieters, Hallie C, PA-C   predniSONE (DELTASONE) 50 MG tablet Take 1 tablet (50 mg total) by mouth daily for 5 days. 5 tablet Wieters, Hallie C, PA-C   guaiFENesin-codeine 100-10 MG/5ML syrup Take 5 mLs by mouth at bedtime for 5 days. 75 mL Wieters, Hallie C, PA-C     Controlled Substance Prescriptions [place] Controlled Substance Registry consulted? No   Janith Lima, [place] 12/14/17 6389

## 2018-03-27 DIAGNOSIS — Z1283 Encounter for screening for malignant neoplasm of skin: Secondary | ICD-10-CM | POA: Diagnosis not present

## 2018-03-27 DIAGNOSIS — D1800 Hemangioma unspecified site: Secondary | ICD-10-CM | POA: Diagnosis not present

## 2018-03-27 DIAGNOSIS — L812 Freckles: Secondary | ICD-10-CM | POA: Diagnosis not present

## 2018-03-27 DIAGNOSIS — D225 Melanocytic nevi of trunk: Secondary | ICD-10-CM | POA: Diagnosis not present

## 2018-03-27 DIAGNOSIS — L309 Dermatitis, unspecified: Secondary | ICD-10-CM | POA: Diagnosis not present

## 2018-03-27 DIAGNOSIS — L820 Inflamed seborrheic keratosis: Secondary | ICD-10-CM | POA: Diagnosis not present

## 2018-03-27 DIAGNOSIS — L578 Other skin changes due to chronic exposure to nonionizing radiation: Secondary | ICD-10-CM | POA: Diagnosis not present

## 2018-03-27 DIAGNOSIS — L570 Actinic keratosis: Secondary | ICD-10-CM | POA: Diagnosis not present

## 2018-03-27 DIAGNOSIS — L821 Other seborrheic keratosis: Secondary | ICD-10-CM | POA: Diagnosis not present

## 2018-03-27 DIAGNOSIS — L853 Xerosis cutis: Secondary | ICD-10-CM | POA: Diagnosis not present

## 2018-03-27 DIAGNOSIS — Z85828 Personal history of other malignant neoplasm of skin: Secondary | ICD-10-CM | POA: Diagnosis not present

## 2018-04-24 ENCOUNTER — Ambulatory Visit (INDEPENDENT_AMBULATORY_CARE_PROVIDER_SITE_OTHER): Payer: PPO | Admitting: Otolaryngology

## 2018-04-24 DIAGNOSIS — R1312 Dysphagia, oropharyngeal phase: Secondary | ICD-10-CM | POA: Diagnosis not present

## 2018-04-24 DIAGNOSIS — H6123 Impacted cerumen, bilateral: Secondary | ICD-10-CM | POA: Diagnosis not present

## 2018-05-01 ENCOUNTER — Telehealth: Payer: Self-pay | Admitting: Internal Medicine

## 2018-05-01 NOTE — Telephone Encounter (Signed)
Fine with me. Will defer to PCP

## 2018-05-01 NOTE — Telephone Encounter (Signed)
Pt want to be dismissed from the care of Dr Silvio Pate and want Dr Einar Pheasant to take him on as a New  Pt. Please advise.

## 2018-05-01 NOTE — Telephone Encounter (Signed)
I am okay with the transfer of care

## 2018-05-05 NOTE — Telephone Encounter (Signed)
I left msg for pt to return my call to set up appt. OK to sch with Dr Einar Pheasant for [facility].

## 2018-05-09 ENCOUNTER — Encounter: Payer: PPO | Admitting: Internal Medicine

## 2018-05-29 ENCOUNTER — Encounter: Payer: Self-pay | Admitting: Family Medicine

## 2018-05-29 ENCOUNTER — Ambulatory Visit (INDEPENDENT_AMBULATORY_CARE_PROVIDER_SITE_OTHER): Payer: PPO | Admitting: Family Medicine

## 2018-05-29 VITALS — BP 140/72 | HR 62 | Temp 97.5°F | Ht 65.25 in | Wt 174.8 lb

## 2018-05-29 DIAGNOSIS — I10 Essential (primary) hypertension: Secondary | ICD-10-CM | POA: Diagnosis not present

## 2018-05-29 DIAGNOSIS — E782 Mixed hyperlipidemia: Secondary | ICD-10-CM

## 2018-05-29 DIAGNOSIS — Z9889 Other specified postprocedural states: Secondary | ICD-10-CM

## 2018-05-29 DIAGNOSIS — D72828 Other elevated white blood cell count: Secondary | ICD-10-CM

## 2018-05-29 DIAGNOSIS — Z85820 Personal history of malignant melanoma of skin: Secondary | ICD-10-CM | POA: Insufficient documentation

## 2018-05-29 DIAGNOSIS — K861 Other chronic pancreatitis: Secondary | ICD-10-CM

## 2018-05-29 DIAGNOSIS — D72829 Elevated white blood cell count, unspecified: Secondary | ICD-10-CM | POA: Insufficient documentation

## 2018-05-29 DIAGNOSIS — Z87891 Personal history of nicotine dependence: Secondary | ICD-10-CM

## 2018-05-29 DIAGNOSIS — Z8673 Personal history of transient ischemic attack (TIA), and cerebral infarction without residual deficits: Secondary | ICD-10-CM | POA: Diagnosis not present

## 2018-05-29 DIAGNOSIS — R7303 Prediabetes: Secondary | ICD-10-CM | POA: Diagnosis not present

## 2018-05-29 LAB — LIPID PANEL
CHOL/HDL RATIO: 4
Cholesterol: 162 mg/dL (ref 0–200)
HDL: 41.2 mg/dL (ref >39.00)
LDL CALC: 92 mg/dL (ref 0–99)
NONHDL: 120.99
Triglycerides: 147 mg/dL (ref 0.0–149.0)
VLDL: 29.4 mg/dL (ref 0.0–40.0)

## 2018-05-29 LAB — CBC WITH DIFFERENTIAL/PLATELET
BASOS PCT: 1.4 % (ref 0.0–3.0)
Basophils Absolute: 0.1 10*3/uL (ref 0.0–0.1)
EOS PCT: 2.3 % (ref 0.0–5.0)
Eosinophils Absolute: 0.2 10*3/uL (ref 0.0–0.7)
HEMATOCRIT: 46.3 % (ref 39.0–52.0)
HEMOGLOBIN: 15.7 g/dL (ref 13.0–17.0)
Lymphocytes Relative: 21.7 % (ref 12.0–46.0)
Lymphs Abs: 2 10*3/uL (ref 0.7–4.0)
MCHC: 33.9 g/dL (ref 30.0–36.0)
MCV: 90.1 fl (ref 78.0–100.0)
MONOS PCT: 11.3 % (ref 3.0–12.0)
Monocytes Absolute: 1.1 10*3/uL — ABNORMAL HIGH (ref 0.1–1.0)
Neutro Abs: 5.9 10*3/uL (ref 1.4–7.7)
Neutrophils Relative %: 63.3 % (ref 43.0–77.0)
Platelets: 168 10*3/uL (ref 150.0–400.0)
RBC: 5.14 Mil/uL (ref 4.22–5.81)
RDW: 14.1 % (ref 11.5–15.5)
WBC: 9.4 10*3/uL (ref 4.0–10.5)

## 2018-05-29 LAB — COMPREHENSIVE METABOLIC PANEL
ALK PHOS: 51 U/L (ref 39–117)
ALT: 21 U/L (ref 0–53)
AST: 25 U/L (ref 0–37)
Albumin: 4.8 g/dL (ref 3.5–5.2)
BUN: 17 mg/dL (ref 6–23)
CHLORIDE: 103 meq/L (ref 96–112)
CO2: 33 mEq/L — ABNORMAL HIGH (ref 19–32)
Calcium: 9.7 mg/dL (ref 8.4–10.5)
Creatinine, Ser: 1.11 mg/dL (ref 0.40–1.50)
GFR: 64.24 mL/min (ref >60.00)
Glucose, Bld: 95 mg/dL (ref 70–99)
POTASSIUM: 4.1 meq/L (ref 3.5–5.1)
Sodium: 141 mEq/L (ref 135–145)
TOTAL PROTEIN: 7.1 g/dL (ref 6.0–8.3)
Total Bilirubin: 1.4 mg/dL — ABNORMAL HIGH (ref 0.2–1.2)

## 2018-05-29 LAB — HEMOGLOBIN A1C: Hgb A1c MFr Bld: 5.3 % (ref 4.6–6.5)

## 2018-05-29 NOTE — Assessment & Plan Note (Signed)
Sees Dermatology

## 2018-05-29 NOTE — Patient Instructions (Addendum)
#  Chronic Pancreatitis - recommend reaching out the Gastroenterologist (GI) doctor to discuss if you still need the [name] or if there is a less expensive alternative  #Lung Cancer Screening - Consider whether or not you would like to be screened for lung cancer  #High cholesterol #history of stroke - we will check cholesterol today - continue to take lovastatin to decrease your risk of heart attack and stroke   Lung Cancer Screening A lung cancer screening is a test that checks for lung cancer. Lung cancer screening is done to look for lung cancer in its very early stages, before it spreads and becomes harder to treat and before symptoms appear. Finding cancer early improves the chances of successful treatment. It may save your life. Should I be screened for lung cancer? You should be screened for lung cancer if all of these apply:  You currently smoke or you have quit smoking within the past 15 years.  You are 30-75 years old. Screening may be recommended up to age 65 depending on your overall health and other factors.  You are in good general health.  You have a smoking history of 1 pack a day for 30 years or 2 packs a day for 15 years. Screening may also be recommended if you are at high risk for the disease. You may be at high risk if:  You have a family history of lung cancer.  You have been exposed to asbestos.  You have chronic obstructive pulmonary disease (COPD).  You have a history of previous lung cancer. How often should I be screened for lung cancer?  If you are at risk for lung cancer, it is recommended that you are screened once a year. The recommended screening test is a low-dose CT scan. How can I lower my risk of lung cancer? To lower your risk of developing lung cancer:  If you smoke, stop smoking all tobacco products.  Avoid secondhand smoke.  Avoid exposure to radiation.  Avoid exposure to radon gas. Have your home checked for radon  regularly.  Avoid things that cause cancer (carcinogens).  Avoid living or working in places with high air pollution. Where to find more information Ask your health care provider about the risks and benefits of screening. More information and resources are available from these organizations:  [place] (ACS): www.cancer.org  American Lung Association: www.lung.org Contact a health care provider if:  You start to show symptoms of lung cancer, including: ? Coughing that will not go away. ? Wheezing. ? Chest pain. ? Coughing up blood. ? Shortness of breath. ? Weight loss that cannot be explained. ? Constant fatigue. Summary  Lung cancer screening may find lung cancer before symptoms appear. Finding cancer early improves the chances of successful treatment. It may save your life.  If you are at risk for lung cancer, it is recommended that you are screened once a year. The recommended screening test is a low-dose CT scan.  You can make lifestyle changes to lower your risk of lung cancer.  Ask your health care provider about the risks and benefits of screening. This information is not intended to replace advice given to you by your health care provider. Make sure you discuss any questions you have with your health care provider. Document Released: 02/15/2016 Document Revised: 06/18/2017 Document Reviewed: 02/15/2016 Elsevier Interactive Patient Education  [employer].

## 2018-05-29 NOTE — Assessment & Plan Note (Signed)
BP mildly elevated today, but no longer on medication. Recheck in 3 months.

## 2018-05-29 NOTE — Assessment & Plan Note (Signed)
Reiterated that due to prior stroke would recommend continuous statin therapy.

## 2018-05-29 NOTE — Assessment & Plan Note (Signed)
Meet criteria for lung cancer screening. Long discussion today, but patient unable to decide whether he would like screening or not. Will readdress at follow-up. Information given in AVS

## 2018-05-29 NOTE — Assessment & Plan Note (Signed)
Noted on last year's blood work. Repeat today to follow-up.

## 2018-05-29 NOTE — Assessment & Plan Note (Signed)
Previous fasting CBG have been in normal range but HgbA1c was elevated in 2016. Repeat today.

## 2018-05-29 NOTE — Progress Notes (Signed)
Subjective:     Cody Willis is a 78 y.o. male presenting for Transfer of Care (from Dr. Silvio Pate); Idiopathic chronic pancreatitis (was diagnosed by GI. notes in epic. Would like to discuss if he should be taking Probiotics.); and Medication Management (patient would like to know if he should be taking Lovastatin still. He would like to get levels checked. He is fasting today.)     HPI  #Chronic pancreatitis - taking enzymes - has to take with meals - wondering about the cost of the medication  - hits the donut whole at around 6 months - and then the medication is very expensive - wondering if there is an alternative medication  - endorses having chronic diarrhea and weight loss prior to starting the treatment  #HTN - stopped bp medication 4 years ago - was on lisinopril at one point in time  #HLD -on a statin medication - wondering if he still needs it - hx of stroke - no residual effects  #Melanoma - 12 years ago - sees dermatology  #hx of tobacco use - 50 years, quit 4 years ago - briefly discussed lung cancer screening  Review of Systems  Constitutional: Negative for chills and fever.  Eyes: Negative for visual disturbance.  Respiratory: Negative for shortness of breath.   Cardiovascular: Negative for chest pain.  Neurological: Negative for headaches.     Social History   Tobacco Use  Smoking Status Former Smoker  . Packs/day: 1.00  . Years: 50.00  . Pack years: 50.00  . Types: Cigarettes  . Last attempt to quit: 01/16/2015  . Years since quitting: 3.3  Smokeless Tobacco Never Used  Tobacco Comment   Counseling sheet given in exam room to quit smoking         Objective:    BP Readings from Last 3 Encounters:  05/29/18 140/72  12/14/17 129/62  12/12/17 112/85   Wt Readings from Last 3 Encounters:  05/29/18 174 lb 12 oz (79.3 kg)  09/16/17 180 lb 4 oz (81.8 kg)  05/07/17 176 lb 12 oz (80.2 kg)    BP 140/72   Pulse 62   Temp (!) 97.5  F (36.4 C)   Ht 5' 5.25" (1.657 m)   Wt 174 lb 12 oz (79.3 kg)   SpO2 98%   BMI 28.86 kg/m    Physical Exam Constitutional:      Appearance: Normal appearance. He is not ill-appearing or diaphoretic.  HENT:     Right Ear: External ear normal.     Left Ear: External ear normal.     Nose: Nose normal.  Eyes:     General: No scleral icterus.    Extraocular Movements: Extraocular movements intact.     Conjunctiva/sclera: Conjunctivae normal.  Neck:     Musculoskeletal: Neck supple.  Cardiovascular:     Rate and Rhythm: Normal rate and regular rhythm.     Heart sounds: No murmur.  Pulmonary:     Effort: Pulmonary effort is normal. No respiratory distress.     Breath sounds: Normal breath sounds. No wheezing.  Skin:    General: Skin is warm and dry.  Neurological:     Mental Status: He is alert. Mental status is at baseline.  Psychiatric:        Mood and Affect: Mood normal.           Assessment & Plan:   Problem List Items Addressed This Visit      Cardiovascular and Mediastinum  ESSENTIAL HYPERTENSION, BENIGN - Primary    BP mildly elevated today, but no longer on medication. Recheck in 3 months.       Relevant Orders   Comprehensive metabolic panel     Digestive   CHRONIC PANCREATITIS    Pt concerned about the cost of pancreatic enzymes. Discussed that if he were to stop his symptoms may return. Advised checking with GI for less expensive alternatives        Other   HLD (hyperlipidemia)    Reiterated that due to prior stroke would recommend continuous statin therapy.       Relevant Orders   Comprehensive metabolic panel   Lipid panel   History of CVA (cerebrovascular accident)   Former smoker    Meet criteria for lung cancer screening. Long discussion today, but patient unable to decide whether he would like screening or not. Will readdress at follow-up. Information given in AVS      Prediabetes    Previous fasting CBG have been in normal range  but HgbA1c was elevated in 2016. Repeat today.       Relevant Orders   Hemoglobin A1c   Hx of melanoma excision    Sees Dermatology      Leucocytosis    Noted on last year's blood work. Repeat today to follow-up.       Relevant Orders   CBC with Differential       Return in about 3 months (around 08/27/2018).  Lesleigh Noe, MD

## 2018-05-29 NOTE — Progress Notes (Signed)
Letter for lab results

## 2018-05-29 NOTE — Assessment & Plan Note (Signed)
Pt concerned about the cost of pancreatic enzymes. Discussed that if he were to stop his symptoms may return. Advised checking with GI for less expensive alternatives

## 2018-06-20 ENCOUNTER — Other Ambulatory Visit: Payer: Self-pay | Admitting: Internal Medicine

## 2018-08-21 ENCOUNTER — Other Ambulatory Visit: Payer: Self-pay | Admitting: Internal Medicine

## 2018-08-28 ENCOUNTER — Ambulatory Visit: Payer: PPO | Admitting: Family Medicine

## 2018-09-26 ENCOUNTER — Other Ambulatory Visit: Payer: Self-pay | Admitting: Internal Medicine

## 2018-09-26 DIAGNOSIS — Z8719 Personal history of other diseases of the digestive system: Secondary | ICD-10-CM

## 2018-10-15 ENCOUNTER — Telehealth: Payer: Self-pay | Admitting: Nurse Practitioner

## 2018-10-16 ENCOUNTER — Ambulatory Visit (INDEPENDENT_AMBULATORY_CARE_PROVIDER_SITE_OTHER): Payer: PPO | Admitting: Nurse Practitioner

## 2018-10-16 ENCOUNTER — Other Ambulatory Visit: Payer: Self-pay

## 2018-10-16 ENCOUNTER — Encounter: Payer: Self-pay | Admitting: Nurse Practitioner

## 2018-10-16 VITALS — Ht 66.0 in | Wt 175.0 lb

## 2018-10-16 DIAGNOSIS — E538 Deficiency of other specified B group vitamins: Secondary | ICD-10-CM

## 2018-10-16 DIAGNOSIS — K861 Other chronic pancreatitis: Secondary | ICD-10-CM

## 2018-10-16 DIAGNOSIS — K219 Gastro-esophageal reflux disease without esophagitis: Secondary | ICD-10-CM

## 2018-10-16 DIAGNOSIS — Z8719 Personal history of other diseases of the digestive system: Secondary | ICD-10-CM | POA: Diagnosis not present

## 2018-10-16 MED ORDER — PANTOPRAZOLE SODIUM 40 MG PO TBEC: 40.0000 mg | DELAYED_RELEASE_TABLET | ORAL | 11 refills | Status: DC

## 2018-10-16 MED ORDER — CYANOCOBALAMIN 1000 MCG/ML IJ SOLN: INTRAMUSCULAR | 11 refills | Status: DC

## 2018-10-16 MED ORDER — ZENPEP 25000-79000 UNITS PO CPEP: 2.0000 | ORAL_CAPSULE | Freq: Three times a day (TID) | ORAL | 11 refills | Status: DC

## 2018-10-16 NOTE — Patient Instructions (Signed)
We have sent the following medications to your pharmacy for you to pick up at your convenience: Protonix, Vitamin B12, [name].

## 2018-10-16 NOTE — Progress Notes (Addendum)
Telephonic Visit in setting of [facility]  Patient has given consent for a telephonic visit today and understands that is the same as is required for any face-to-face patient encounter except that the service was provided via telephone.   At the time of this telephonic visit the patient was located at home and I, the Provider, at the office of [facility].   No one other than the patient and I participated in this telephonic visit today.   Total time of telephonic visit :  11 minutes.          Chief Complaint:    Needs refill  IMPRESSION and PLAN:    1.  GERD-  asymptomatic on daily pantoprazole.  Last office visit June 2019, patient requesting refills. -Will refill Protonix 40 mg #30 +11 refills  2.  Chronic idiopathic pancreatitis-doing well on two [name] TID.  Occasional diarrhea managed with Imodium. -Refill [name] #180 with 11 refills  3.  Vitamin B12 deficiency.  Patiently does self injections monthly.  Requesting refill.  -We will refill vitamin B-12 1 vial monthly with 11 refills  4.  History of hyperplastic gastric polyps, last surveillance exam August 2018.  No dysplasia at that time.  5.  Colorectal screening, he is up-to-date with normal colonoscopy in 2011.  Due for surveillance in 2021  Addendum: Reviewed and agree with assessment and management plan. Pyrtle, Lajuan Lines, MD     HPI:     Patient is a 78 yo male with hypertension, hyperlipidemia, history of CVA.  He is known to Dr. Hilarie Fredrickson for history of chronic idiopathic pancreatitis, hyperplastic gastric polyps, GERD and B12 deficiency.  Patient was last seen June 2019, he is requesting refills on medications but preferred to have a visit over the telephone.  Patient says he is doing well, no GI complaints.  He has occasional diarrhea managed with Imodium as needed . He is compliant with [name] and Protonix as well as monthly vitamin B12 injections.  His weight is stable.  He is up-to-date on EGD and  screening colonoscopy.  Next screening colonoscopy due 2021  Review of systems:     No chest pain, no SOB, no fevers, no urinary sx   Past Medical History:  Diagnosis Date   Chronic pancreatitis ([place])    Esophageal reflux    Essential hypertension, benign    Gastric polyps    hyperplastic   Hiatal hernia    Other B-complex deficiencies    Pure hypercholesterolemia    Stroke ([place]) 01/16/2015   Pt. started on plavix    Patient's surgical history, family medical history, social history, medications and allergies were all reviewed in Epic   Creatinine clearance cannot be calculated (Patient's most recent lab result is older than the maximum 21 days allowed.)  Current Outpatient Medications  Medication Sig Dispense Refill   aspirin EC 325 MG tablet Take 1 tablet (325 mg total) by mouth daily. 90 tablet 3   cyanocobalamin (,VITAMIN B-12,) 1000 MCG/ML injection INJECT 1ML INTO THE MUSCLE EVERY 30 DAYS. MUST HAVE OFFICE VISIT FOR FURTHER REFILLS 1 mL 0   loperamide (IMODIUM A-D) 2 MG tablet Take 2 mg by mouth as needed for diarrhea or loose stools.      lovastatin (MEVACOR) 40 MG tablet TAKE 1 TABLET BY MOUTH ONCE DAILY AT BEDTIME 90 tablet 3   Naproxen Sodium (ALEVE) 220 MG CAPS Take by mouth daily as needed.     Pancrelipase, Lip-Prot-Amyl, ([name]) 25000-79000 units CPEP Take 2 capsules by  mouth 3 (three) times daily with meals. 180 capsule 11   pantoprazole (PROTONIX) 40 MG tablet Take 1 tablet (40 mg total) by mouth every morning. MUST HAVE OFFICE VISIT FOR FURTHER REFILLS 30 tablet 0   triamcinolone (KENALOG) 0.025 % ointment daily as needed for rash.     traMADol (ULTRAM) 50 MG tablet Take 50 mg by mouth as needed.      No current facility-administered medications for this visit.     Physical Exam:     Ht 5\' 6"  (1.676 m)    Wt 175 lb (79.4 kg) Comment: estimate   BMI 28.25 kg/m   Exam : N/A   Tye Savoy , NP 10/16/2018, 8:47 AM

## 2018-10-16 NOTE — Addendum Note (Signed)
Addended by: Marzella Schlein on: 10/16/2018 09:05 AM   Modules accepted: Orders

## 2018-10-18 ENCOUNTER — Encounter: Payer: Self-pay | Admitting: Internal Medicine

## 2018-10-23 ENCOUNTER — Ambulatory Visit (INDEPENDENT_AMBULATORY_CARE_PROVIDER_SITE_OTHER): Payer: PPO | Admitting: Otolaryngology

## 2018-10-23 DIAGNOSIS — H6123 Impacted cerumen, bilateral: Secondary | ICD-10-CM

## 2018-11-01 NOTE — Addendum Note (Signed)
Addended by: Jerene Bears on: 11/01/2018 08:36 AM   Modules accepted: Level of Service

## 2019-01-28 ENCOUNTER — Other Ambulatory Visit: Payer: Self-pay | Admitting: Nurse Practitioner

## 2019-01-28 DIAGNOSIS — Z8719 Personal history of other diseases of the digestive system: Secondary | ICD-10-CM

## 2019-04-16 DIAGNOSIS — L299 Pruritus, unspecified: Secondary | ICD-10-CM | POA: Diagnosis not present

## 2019-04-16 DIAGNOSIS — D229 Melanocytic nevi, unspecified: Secondary | ICD-10-CM | POA: Diagnosis not present

## 2019-04-16 DIAGNOSIS — D692 Other nonthrombocytopenic purpura: Secondary | ICD-10-CM | POA: Diagnosis not present

## 2019-04-16 DIAGNOSIS — B353 Tinea pedis: Secondary | ICD-10-CM | POA: Diagnosis not present

## 2019-04-16 DIAGNOSIS — L918 Other hypertrophic disorders of the skin: Secondary | ICD-10-CM | POA: Diagnosis not present

## 2019-04-16 DIAGNOSIS — L820 Inflamed seborrheic keratosis: Secondary | ICD-10-CM | POA: Diagnosis not present

## 2019-04-16 DIAGNOSIS — L821 Other seborrheic keratosis: Secondary | ICD-10-CM | POA: Diagnosis not present

## 2019-04-16 DIAGNOSIS — L720 Epidermal cyst: Secondary | ICD-10-CM | POA: Diagnosis not present

## 2019-04-16 DIAGNOSIS — L814 Other melanin hyperpigmentation: Secondary | ICD-10-CM | POA: Diagnosis not present

## 2019-04-16 DIAGNOSIS — L570 Actinic keratosis: Secondary | ICD-10-CM | POA: Diagnosis not present

## 2019-04-16 DIAGNOSIS — D2230 Melanocytic nevi of unspecified part of face: Secondary | ICD-10-CM | POA: Diagnosis not present

## 2019-04-16 DIAGNOSIS — D225 Melanocytic nevi of trunk: Secondary | ICD-10-CM | POA: Diagnosis not present

## 2019-04-23 DIAGNOSIS — R0602 Shortness of breath: Secondary | ICD-10-CM | POA: Diagnosis not present

## 2019-04-23 DIAGNOSIS — J449 Chronic obstructive pulmonary disease, unspecified: Secondary | ICD-10-CM | POA: Diagnosis not present

## 2019-04-23 DIAGNOSIS — J209 Acute bronchitis, unspecified: Secondary | ICD-10-CM | POA: Diagnosis not present

## 2019-04-23 DIAGNOSIS — J9801 Acute bronchospasm: Secondary | ICD-10-CM | POA: Diagnosis not present

## 2019-04-30 DIAGNOSIS — H9311 Tinnitus, right ear: Secondary | ICD-10-CM | POA: Diagnosis not present

## 2019-04-30 DIAGNOSIS — H6121 Impacted cerumen, right ear: Secondary | ICD-10-CM | POA: Diagnosis not present

## 2019-04-30 DIAGNOSIS — H903 Sensorineural hearing loss, bilateral: Secondary | ICD-10-CM | POA: Diagnosis not present

## 2019-06-05 ENCOUNTER — Encounter: Payer: Self-pay | Admitting: Internal Medicine

## 2019-07-17 ENCOUNTER — Ambulatory Visit (AMBULATORY_SURGERY_CENTER): Payer: Self-pay

## 2019-07-17 ENCOUNTER — Other Ambulatory Visit: Payer: Self-pay | Admitting: Internal Medicine

## 2019-07-17 ENCOUNTER — Other Ambulatory Visit: Payer: Self-pay

## 2019-07-17 VITALS — Temp 97.0°F | Ht 65.0 in | Wt 169.0 lb

## 2019-07-17 DIAGNOSIS — Z1211 Encounter for screening for malignant neoplasm of colon: Secondary | ICD-10-CM

## 2019-07-17 DIAGNOSIS — Z8719 Personal history of other diseases of the digestive system: Secondary | ICD-10-CM

## 2019-07-17 MED ORDER — PANTOPRAZOLE SODIUM 40 MG PO TBEC: 40.0000 mg | DELAYED_RELEASE_TABLET | Freq: Every morning | ORAL | 3 refills | Status: DC

## 2019-07-17 MED ORDER — NA SULFATE-K SULFATE-MG SULF 17.5-3.13-1.6 GM/177ML PO SOLN: 1.0000 | Freq: Once | ORAL | 0 refills | Status: AC

## 2019-07-17 MED ORDER — CYANOCOBALAMIN 1000 MCG/ML IJ SOLN: INTRAMUSCULAR | 11 refills | Status: DC

## 2019-07-17 MED ORDER — ZENPEP 25000-79000 UNITS PO CPEP: 2.0000 | ORAL_CAPSULE | Freq: Three times a day (TID) | ORAL | 11 refills | Status: DC

## 2019-07-17 NOTE — Progress Notes (Signed)
No allergies to soy or egg Pt is not on blood thinners or diet pills Denies issues with sedation/intubation Denies atrial flutter/fib Denies constipation   Emmi instructions given to pt  Pt is aware of Covid safety and care partner requirements.  Care partner 33.3

## 2019-07-17 NOTE — Telephone Encounter (Signed)
Refilled as directed. Cody Willis had his pre-visit today and he is scheduled for his colonoscopy on 07-31-2019

## 2019-07-29 ENCOUNTER — Encounter: Payer: Self-pay | Admitting: Internal Medicine

## 2019-07-31 ENCOUNTER — Encounter: Payer: Self-pay | Admitting: Internal Medicine

## 2019-07-31 ENCOUNTER — Other Ambulatory Visit: Payer: Self-pay

## 2019-07-31 ENCOUNTER — Ambulatory Visit (AMBULATORY_SURGERY_CENTER): Payer: PPO | Admitting: Internal Medicine

## 2019-07-31 VITALS — BP 125/63 | HR 57 | Temp 97.1°F | Resp 14 | Ht 65.0 in | Wt 169.0 lb

## 2019-07-31 DIAGNOSIS — Z1211 Encounter for screening for malignant neoplasm of colon: Secondary | ICD-10-CM

## 2019-07-31 MED ORDER — SODIUM CHLORIDE 0.9 % IV SOLN: 500.0000 mL | INTRAVENOUS | Status: DC

## 2019-07-31 NOTE — Progress Notes (Signed)
To PACU, VSS. Report to [email or web address].tb 

## 2019-07-31 NOTE — Patient Instructions (Signed)
Handout on diverticulosis given.   YOU HAD AN ENDOSCOPIC PROCEDURE TODAY AT THE [facility]:   Refer to the procedure report that was given to you for any specific questions about what was found during the examination.  If the procedure report does not answer your questions, please call your gastroenterologist to clarify.  If you requested that your care partner not be given the details of your procedure findings, then the procedure report has been included in a sealed envelope for you to review at your convenience later.  YOU SHOULD EXPECT: Some feelings of bloating in the abdomen. Passage of more gas than usual.  Walking can help get rid of the air that was put into your GI tract during the procedure and reduce the bloating. If you had a lower endoscopy (such as a colonoscopy or flexible sigmoidoscopy) you may notice spotting of blood in your stool or on the toilet paper. If you underwent a bowel prep for your procedure, you may not have a normal bowel movement for a few days.  Please Note:  You might notice some irritation and congestion in your nose or some drainage.  This is from the oxygen used during your procedure.  There is no need for concern and it should clear up in a day or so.  SYMPTOMS TO REPORT IMMEDIATELY:   Following lower endoscopy (colonoscopy or flexible sigmoidoscopy):  Excessive amounts of blood in the stool  Significant tenderness or worsening of abdominal pains  Swelling of the abdomen that is new, acute  Fever of 100F or higher   For urgent or emergent issues, a gastroenterologist can be reached at any hour by calling [phone]. Do not use MyChart messaging for urgent concerns.    DIET:  We do recommend a small meal at first, but then you may proceed to your regular diet.  Drink plenty of fluids but you should avoid alcoholic beverages for 24 hours.  ACTIVITY:  You should plan to take it easy for the rest of today and you should NOT DRIVE or use  heavy machinery until tomorrow (because of the sedation medicines used during the test).    FOLLOW UP: Our staff will call the number listed on your records 48-72 hours following your procedure to check on you and address any questions or concerns that you may have regarding the information given to you following your procedure. If we do not reach you, we will leave a message.  We will attempt to reach you two times.  During this call, we will ask if you have developed any symptoms of COVID 19. If you develop any symptoms (ie: fever, flu-like symptoms, shortness of breath, cough etc.) before then, please call [phone].  If you test positive for Covid 19 in the 2 weeks post procedure, please call and report this information to us.    If any biopsies were taken you will be contacted by phone or by letter within the next 1-3 weeks.  Please call us at [phone] if you have not heard about the biopsies in 3 weeks.    SIGNATURES/CONFIDENTIALITY: You and/or your care partner have signed paperwork which will be entered into your electronic medical record.  These signatures attest to the fact that that the information above on your After Visit Summary has been reviewed and is understood.  Full responsibility of the confidentiality of this discharge information lies with you and/or your care-partner. 

## 2019-07-31 NOTE — Progress Notes (Signed)
Temp by JB and vitals by CW

## 2019-07-31 NOTE — Op Note (Signed)
[facility] Patient Name: Cody Willis Procedure Date: 07/31/2019 8:27 AM MRN: [id number] Endoscopist: Jerene Bears , MD Age: 79 Referring MD:  Date of Birth: [birth year] Gender: Male Account #: [id number] Procedure:                Colonoscopy Indications:              Screening for colorectal malignant neoplasm, Last                            colonoscopy 10 years ago Medicines:                Propofol per Anesthesia Procedure:                Pre-Anesthesia Assessment:                           - Prior to the procedure, a History and Physical                            was performed, and patient medications and                            allergies were reviewed. The patient's tolerance of                            previous anesthesia was also reviewed. The risks                            and benefits of the procedure and the sedation                            options and risks were discussed with the patient.                            All questions were answered, and informed consent                            was obtained. Prior Anticoagulants: The patient has                            taken no previous anticoagulant or antiplatelet                            agents. ASA Grade Assessment: II - A patient with                            mild systemic disease. After reviewing the risks                            and benefits, the patient was deemed in                            satisfactory condition to undergo the procedure.  After obtaining informed consent, the colonoscope                            was passed under direct vision. Throughout the                            procedure, the patient's blood pressure, pulse, and                            oxygen saturations were monitored continuously. The                            Colonoscope was introduced through the anus and                            advanced to the cecum, identified by  appendiceal                            orifice and ileocecal valve. The colonoscopy was                            performed without difficulty. The patient tolerated                            the procedure well. The quality of the bowel                            preparation was excellent. The ileocecal valve,                            appendiceal orifice, and rectum were photographed. Scope In: 8:31:53 AM Scope Out: 8:43:43 AM Scope Withdrawal Time: 0 hours 9 minutes 29 seconds  Total Procedure Duration: 0 hours 11 minutes 50 seconds  Findings:                 Multiple small-mouthed diverticula were found in                            the sigmoid colon and cecum.                           Internal hemorrhoids were found during                            retroflexion. The hemorrhoids were small. Complications:            No immediate complications. Estimated Blood Loss:     Estimated blood loss: none. Impression:               - Diverticulosis in the sigmoid colon and in the                            cecum.                           - Small internal hemorrhoids.                           -  No specimens collected. Recommendation:           - Patient has a contact number available for                            emergencies. The signs and symptoms of potential                            delayed complications were discussed with the                            patient. Return to normal activities tomorrow.                            Written discharge instructions were provided to the                            patient.                           - Resume previous diet.                           - Continue present medications.                           - No repeat colonoscopy due to age and the absence                            of colonic polyps. Jerene Bears, MD 07/31/2019 8:46:52 AM This report has been signed electronically.

## 2019-08-04 ENCOUNTER — Telehealth: Payer: Self-pay | Admitting: *Deleted

## 2019-08-04 NOTE — Telephone Encounter (Signed)
  Follow up Call-  Call back number 07/31/2019 11/26/2016  Post procedure Call Back phone  # [phone]  Permission to leave phone message Yes Yes  Some recent data might be hidden     Patient questions:  Message left to call us if necessary.

## 2019-08-04 NOTE — Telephone Encounter (Signed)
1. Have you developed a fever since your procedure? no  2.   Have you had an respiratory symptoms (SOB or cough) since your procedure? no  3.   Have you tested positive for COVID 19 since your procedure no  4.   Have you had any family members/close contacts diagnosed with the COVID 19 since your procedure?  no   If yes to any of these questions please route to Joylene John, RN and Erenest Rasher, RN     Follow up Call-  Call back number 07/31/2019 11/26/2016  Post procedure Call Back phone  # [phone]  Permission to leave phone message Yes Yes  Some recent data might be hidden     Patient questions:  Do you have a fever, pain , or abdominal swelling? No. Pain Score  0 *  Have you tolerated food without any problems? Yes.    Have you been able to return to your normal activities? Yes.    Do you have any questions about your discharge instructions: Diet   No. Medications  No. Follow up visit  No.  Do you have questions or concerns about your Care? No.  Actions: * If pain score is 4 or above: No action needed, pain <4.

## 2019-08-20 ENCOUNTER — Telehealth: Payer: Self-pay

## 2019-08-20 ENCOUNTER — Ambulatory Visit (INDEPENDENT_AMBULATORY_CARE_PROVIDER_SITE_OTHER): Payer: PPO | Admitting: Family Medicine

## 2019-08-20 ENCOUNTER — Other Ambulatory Visit: Payer: Self-pay

## 2019-08-20 VITALS — BP 140/76 | HR 65 | Temp 98.1°F | Ht 65.25 in | Wt 169.2 lb

## 2019-08-20 DIAGNOSIS — K861 Other chronic pancreatitis: Secondary | ICD-10-CM

## 2019-08-20 DIAGNOSIS — Z5320 Procedure and treatment not carried out because of patient's decision for unspecified reasons: Secondary | ICD-10-CM

## 2019-08-20 DIAGNOSIS — Z87891 Personal history of nicotine dependence: Secondary | ICD-10-CM

## 2019-08-20 DIAGNOSIS — Z8673 Personal history of transient ischemic attack (TIA), and cerebral infarction without residual deficits: Secondary | ICD-10-CM

## 2019-08-20 DIAGNOSIS — K2100 Gastro-esophageal reflux disease with esophagitis, without bleeding: Secondary | ICD-10-CM | POA: Diagnosis not present

## 2019-08-20 DIAGNOSIS — E782 Mixed hyperlipidemia: Secondary | ICD-10-CM

## 2019-08-20 DIAGNOSIS — I10 Essential (primary) hypertension: Secondary | ICD-10-CM | POA: Diagnosis not present

## 2019-08-20 DIAGNOSIS — Z125 Encounter for screening for malignant neoplasm of prostate: Secondary | ICD-10-CM | POA: Diagnosis not present

## 2019-08-20 LAB — COMPREHENSIVE METABOLIC PANEL
ALT: 15 U/L (ref 0–53)
AST: 20 U/L (ref 0–37)
Albumin: 4.5 g/dL (ref 3.5–5.2)
Alkaline Phosphatase: 52 U/L (ref 39–117)
BUN: 20 mg/dL (ref 6–23)
CO2: 33 mEq/L — ABNORMAL HIGH (ref 19–32)
Calcium: 9.2 mg/dL (ref 8.4–10.5)
Chloride: 103 mEq/L (ref 96–112)
Creatinine, Ser: 1.17 mg/dL (ref 0.40–1.50)
GFR: 60.26 mL/min (ref >60.00)
Glucose, Bld: 91 mg/dL (ref 70–99)
Potassium: 4 mEq/L (ref 3.5–5.1)
Sodium: 141 mEq/L (ref 135–145)
Total Bilirubin: 1.1 mg/dL (ref 0.2–1.2)
Total Protein: 6.3 g/dL (ref 6.0–8.3)

## 2019-08-20 LAB — LIPID PANEL
Cholesterol: 151 mg/dL (ref 0–200)
HDL: 42.5 mg/dL (ref >39.00)
LDL Cholesterol: 88 mg/dL (ref 0–99)
NonHDL: 108.54
Total CHOL/HDL Ratio: 4
Triglycerides: 104 mg/dL (ref 0.0–149.0)
VLDL: 20.8 mg/dL (ref 0.0–40.0)

## 2019-08-20 LAB — PSA: PSA: 0.29 ng/mL (ref 0.10–4.00)

## 2019-08-20 MED ORDER — LOVASTATIN 40 MG PO TABS: ORAL_TABLET | ORAL | 3 refills | Status: DC

## 2019-08-20 NOTE — Patient Instructions (Signed)
Great to see you!  Return in 6 months for Medicare Wellness visit

## 2019-08-20 NOTE — Progress Notes (Signed)
Subjective:     Cody Willis is a 79 y.o. male presenting for Hypertension (follow up)     HPI   #HTN - does not check bp at home  #pancreatitis - doing ok - taking zenpep  #prior stroke - told to stay <140/90   Review of Systems   Social History   Tobacco Use  Smoking Status Former Smoker  . Packs/day: 1.00  . Years: 50.00  . Pack years: 50.00  . Types: Cigarettes  . Quit date: 01/16/2015  . Years since quitting: 4.5  Smokeless Tobacco Never Used  Tobacco Comment   Counseling sheet given in exam room to quit smoking         Objective:    BP Readings from Last 3 Encounters:  08/20/19 140/76  07/31/19 125/63  05/29/18 140/72   Wt Readings from Last 3 Encounters:  08/20/19 169 lb 4 oz (76.8 kg)  07/31/19 169 lb (76.7 kg)  07/17/19 169 lb (76.7 kg)    BP 140/76   Pulse 65   Temp 98.1 F (36.7 C)   Ht 5' 5.25" (1.657 m)   Wt 169 lb 4 oz (76.8 kg)   SpO2 98%   BMI 27.95 kg/m    Physical Exam Constitutional:      Appearance: Normal appearance. He is not ill-appearing or diaphoretic.  HENT:     Right Ear: External ear normal.     Left Ear: External ear normal.  Eyes:     General: No scleral icterus.    Extraocular Movements: Extraocular movements intact.     Conjunctiva/sclera: Conjunctivae normal.  Cardiovascular:     Rate and Rhythm: Normal rate and regular rhythm.     Heart sounds: No murmur.  Pulmonary:     Effort: Pulmonary effort is normal. No respiratory distress.     Breath sounds: Normal breath sounds. No wheezing.  Musculoskeletal:     Cervical back: Neck supple.  Skin:    General: Skin is warm and dry.  Neurological:     Mental Status: He is alert. Mental status is at baseline.  Psychiatric:        Mood and Affect: Mood normal.           Assessment & Plan:   Problem List Items Addressed This Visit      Cardiovascular and Mediastinum   Essential hypertension    BP mildly elevated. Home monitoring and return  in 6 months for re-check      Relevant Medications   lovastatin (MEVACOR) 40 MG tablet   Other Relevant Orders   Comprehensive metabolic panel     Digestive   ACID REFLUX DISEASE   Relevant Orders   Comprehensive metabolic panel   CHRONIC PANCREATITIS    Follows with Dr. Raquel James (GI). Doing well. No recent flares.       Relevant Orders   Comprehensive metabolic panel     Other   HLD (hyperlipidemia)    Labs today. Continue statin      Relevant Medications   lovastatin (MEVACOR) 40 MG tablet   Other Relevant Orders   Lipid panel   History of CVA (cerebrovascular accident) - Primary    Taking statin and asa      Relevant Medications   lovastatin (MEVACOR) 40 MG tablet   Former smoker    Initially declined lung cancer screening due to concern for insurance coverage. Then decided he would consider if covered by insurance. Referral placed to help provide more information regarding  coverage      Relevant Orders   Ambulatory Referral for Lung Cancer Scre    Other Visit Diagnoses    Lung cancer screening declined by patient       Screening for prostate cancer       Relevant Orders   PSA       Return in about 6 months (around 02/20/2020) for Medicare Wellness visit.  Lesleigh Noe, MD

## 2019-08-20 NOTE — Assessment & Plan Note (Signed)
Taking statin and asa

## 2019-08-20 NOTE — Assessment & Plan Note (Signed)
Labs today. Continue statin.  

## 2019-08-20 NOTE — Assessment & Plan Note (Signed)
BP mildly elevated. Home monitoring and return in 6 months for re-check

## 2019-08-20 NOTE — Assessment & Plan Note (Signed)
Follows with Dr. Raquel James (GI). Doing well. No recent flares.

## 2019-08-20 NOTE — Telephone Encounter (Signed)
Patient says the Dr. asked for him to bring back his vaccine info.  1st vaccination dose:March 20th,2021 2nd vaccination dose:April10th 2021  [facility] [place]

## 2019-08-20 NOTE — Assessment & Plan Note (Signed)
Initially declined lung cancer screening due to concern for insurance coverage. Then decided he would consider if covered by insurance. Referral placed to help provide more information regarding coverage

## 2019-08-20 NOTE — Telephone Encounter (Signed)
Updated patients chart °

## 2019-08-21 ENCOUNTER — Telehealth: Payer: Self-pay | Admitting: Family Medicine

## 2019-08-21 NOTE — Telephone Encounter (Signed)
Called to speak with patient but he was not available, let patient's wife know that I am mailing his lab results to him as requested-ok per DPR on file to speak with his wife

## 2019-08-21 NOTE — Telephone Encounter (Signed)
Pt returned Anastasiya's call. I relayed the message to him from Dr. Einar Pheasant. He is wondering if he can have a copy of his results mailed to him.

## 2019-08-25 ENCOUNTER — Telehealth: Payer: Self-pay | Admitting: Acute Care

## 2019-08-27 NOTE — Telephone Encounter (Signed)
LMTC x 1  

## 2019-08-31 NOTE — Telephone Encounter (Signed)
Spoke with pt regarding lung cancer screening. Pt states he called his insurance and they told him that he would not be covered because of his age (63). I discussed new screening guidelines with pt that covers up until age 78.  I called [employer] to discuss pt's coverage and they will have pt's concierge call me back to discuss  - Ref# [id number] Will await call back. Please leave in my box to f/u on.

## 2019-09-03 NOTE — Telephone Encounter (Signed)
LMTC x `1 for pt 

## 2019-09-03 NOTE — Telephone Encounter (Signed)
Spoke with pt and he states that he has not heard back from [employer] I called and spoke with Oneshia B. At [employer] and she states that the ticket for this is still open and I should wait for someone to call back . Will await call. Please leave in my box.

## 2019-09-10 NOTE — Telephone Encounter (Signed)
Spoke with pt and let him know that I have not heard back from his insurance company. I advised pt that he could try to call the healthcare concierge number on the back of his card to get more info. Pt states he will try this and get back with me. Will close this message and refer to referral notes.

## 2019-09-25 DIAGNOSIS — H353211 Exudative age-related macular degeneration, right eye, with active choroidal neovascularization: Secondary | ICD-10-CM | POA: Diagnosis not present

## 2019-10-09 ENCOUNTER — Other Ambulatory Visit: Payer: Self-pay | Admitting: Dermatology

## 2019-10-22 ENCOUNTER — Ambulatory Visit: Payer: PPO | Admitting: Dermatology

## 2019-10-22 ENCOUNTER — Other Ambulatory Visit: Payer: Self-pay

## 2019-10-22 DIAGNOSIS — Z1283 Encounter for screening for malignant neoplasm of skin: Secondary | ICD-10-CM

## 2019-10-22 DIAGNOSIS — Z85820 Personal history of malignant melanoma of skin: Secondary | ICD-10-CM

## 2019-10-22 DIAGNOSIS — L820 Inflamed seborrheic keratosis: Secondary | ICD-10-CM | POA: Diagnosis not present

## 2019-10-22 DIAGNOSIS — L578 Other skin changes due to chronic exposure to nonionizing radiation: Secondary | ICD-10-CM

## 2019-10-22 DIAGNOSIS — T1490XA Injury, unspecified, initial encounter: Secondary | ICD-10-CM | POA: Diagnosis not present

## 2019-10-22 DIAGNOSIS — L570 Actinic keratosis: Secondary | ICD-10-CM | POA: Diagnosis not present

## 2019-10-22 DIAGNOSIS — L720 Epidermal cyst: Secondary | ICD-10-CM | POA: Diagnosis not present

## 2019-10-22 DIAGNOSIS — B353 Tinea pedis: Secondary | ICD-10-CM | POA: Diagnosis not present

## 2019-10-22 DIAGNOSIS — L821 Other seborrheic keratosis: Secondary | ICD-10-CM

## 2019-10-22 MED ORDER — MUPIROCIN 2 % EX OINT: TOPICAL_OINTMENT | CUTANEOUS | 2 refills | Status: DC

## 2019-10-22 NOTE — Patient Instructions (Addendum)
Recommend daily broad spectrum sunscreen SPF 30+ to sun-exposed areas, reapply every 2 hours as needed. Call for new or changing lesions.  Cryotherapy Aftercare  . Wash gently with soap and water everyday.   Marland Kitchen Apply Vaseline and Band-Aid daily until healed.  Continue ketoconazole 2% cream to feet and in between the toes Monday thru Friday at bedtime.   Pre-Operative Instructions  You are scheduled for a surgical procedure at [facility]. We recommend you read the following instructions. If you have any questions or concerns, please call the office at [phone].  1. Shower and wash the entire body with soap and water the day of your surgery paying special attention to cleansing at and around the planned surgery site.  2. Avoid aspirin or aspirin containing products at least fourteen (14) days prior to your surgical procedure and for at least one week (7 Days) after your surgical procedure. If you take aspirin on a regular basis for heart disease or history of stroke or for any other reason, we may recommend you continue taking aspirin but please notify us if you take this on a regular basis. Aspirin can cause more bleeding to occur during surgery as well as prolonged bleeding and bruising after surgery.   3. Avoid other nonsteroidal pain medications at least one week prior to surgery and at least one week prior to your surgery. These include medications such as Ibuprofen (Motrin, Advil and Nuprin), Naprosyn, Voltaren, Relafen, etc. If medications are used for therapeutic reasons, please inform us as they can cause increased bleeding or prolonged bleeding during and bruising after surgical procedures.   4. Please advice [place] if you are taking any "blood thinner" medications such as Coumadin or Dipyridamole or Plavix or similar medications. These cause increased bleeding and prolonged bleeding during and bruising after surgical procedures. We may have to consider discontinuing these  medications briefly prior to and shortly after your surgery, if safe to do so.   5. Please inform us of all medications you are currently taking. All medications that are taken regularly should be taken the day of surgery as you always do. Nevertheless, we need to be informed of what medications you are taking prior to surgery to whether they will affect the procedure or cause any complications.   6. Please inform us of any medication allergies. Also inform us of whether you have allergies to Latex or rubber products or whether you have had any adverse reaction to Lidocaine or Epinephrine.  7. Please inform us of any prosthetic or artificial body parts such as artificial heart valve, joint replacements, etc., or similar condition that might require preoperative antibiotics.   8. We recommend avoidance of alcohol at least two weeks prior to surgery and continued avoidence for at least two weeks after surgery.   9. We recommend discontinuation of tobacco smoking at least two weeks prior to surgery and continued abstinence for at least two weeks after surgery.  10. Do not plan strenuous exercise, strenuous work or strenuous lifting for approximately four weeks after your surgery.   11. We request if you are unable to make your scheduled surgical appointment, please call us at least a week in advance or as soon as you are aware of a problem sot aht we can cancel or reschedule you.   12. You MAKE TAKE TYLENOL (acetaminophen) for pain as it is not a blood thinner.   13. PLEASE PLAN TO BE IN TOWN FOR TWO WEEKS FOLLOWING SURGERY, THIS IS IMPORTANT  SO YOU CAN BE CHECKED FOR DRESSING CHANGES, SUTURE REMOVAL AND TO MONITOR FOR POSSIBLE COMPLICATIONS.

## 2019-10-22 NOTE — Progress Notes (Signed)
Follow-Up Visit   Subjective  Cody Willis is a 79 y.o. male who presents for the following: Follow-up.  Patient here today for 6 month follow up. He was treated for AK's of the face, ISK's of the scalp and hands. Patient advises there are new spots at the right elbow and one at the chest. He is also being treated for tinea pedis with ketoconazole 2% cream and advises it has improved and the cream does help. Patient would also like 1.0cm cyst at the right upper back checked since it has become symptomatic. He does have a history of melanoma.  The patient presents for Upper Body Skin Exam (UBSE) for skin cancer screening and mole check.  The following portions of the chart were reviewed this encounter and updated as appropriate:  Tobacco  Allergies  Meds  Problems  Med Hx  Surg Hx  Fam Hx     Review of Systems:  No other skin or systemic complaints except as noted in HPI or Assessment and Plan.  Objective  Well appearing patient in no apparent distress; mood and affect are within normal limits.  All skin waist up examined.  Objective  Right Upper Back: 1.5cm firm sq nodule  Objective  Left Back x 1, left chest x 1 right elbow x 1 (3): Erythematous keratotic or waxy stuck-on papule or plaque.   Objective  Feet: Scaling and maceration web spaces and over distal and lateral soles.   Objective  Face (16): Erythematous thin papules/macules with gritty scale.   Objective  Right Index Finger: crust   Assessment & Plan    Epidermal inclusion cyst Right Upper Back  Benign, observe.   Discussed excision, including resulting scar.      Inflamed seborrheic keratosis (3) Left Back x 1, left chest x 1 right elbow x 1  Destruction of lesion - Left Back x 1, left chest x 1 right elbow x 1 Complexity: simple   Destruction method: cryotherapy   Informed consent: discussed and consent obtained   Timeout:  patient name, date of birth, surgical site, and procedure  verified Lesion destroyed using liquid nitrogen: Yes   Region frozen until ice ball extended beyond lesion: Yes   Outcome: patient tolerated procedure well with no complications   Post-procedure details: wound care instructions given    Tinea pedis of left foot Feet  Continue ketoconazole 2% cream Monday thru Friday at bedtime.   AK (actinic keratosis) (16) Face  Destruction of lesion - Face Complexity: simple   Destruction method: cryotherapy   Informed consent: discussed and consent obtained   Timeout:  patient name, date of birth, surgical site, and procedure verified Lesion destroyed using liquid nitrogen: Yes   Region frozen until ice ball extended beyond lesion: Yes   Outcome: patient tolerated procedure well with no complications   Post-procedure details: wound care instructions given    Traumatic injury Right Index Finger  Start mupirocin daily as needed.  Ordered Medications: mupirocin ointment (BACTROBAN) 2 %  Skin cancer screening   Seborrheic Keratoses - Stuck-on, waxy, tan-brown papules and plaques  - Discussed benign etiology and prognosis. - Observe - Call for any changes  Actinic Damage - diffuse scaly erythematous macules with underlying dyspigmentation - Recommend daily broad spectrum sunscreen SPF 30+ to sun-exposed areas, reapply every 2 hours as needed.  - Call for new or changing lesions.   Return in about 6 months (around 04/23/2020) for TBSE, schedule for surgery/cyst .  Graciella Belton, RMA, am  acting as scribe for Sarina Ser, MD . Documentation: I have reviewed the above documentation for accuracy and completeness, and I agree with the above.  Sarina Ser, MD

## 2019-10-26 ENCOUNTER — Encounter: Payer: Self-pay | Admitting: Dermatology

## 2019-10-26 DIAGNOSIS — H353211 Exudative age-related macular degeneration, right eye, with active choroidal neovascularization: Secondary | ICD-10-CM | POA: Diagnosis not present

## 2019-10-29 DIAGNOSIS — H9313 Tinnitus, bilateral: Secondary | ICD-10-CM | POA: Diagnosis not present

## 2019-10-29 DIAGNOSIS — H6123 Impacted cerumen, bilateral: Secondary | ICD-10-CM | POA: Diagnosis not present

## 2019-10-29 DIAGNOSIS — H903 Sensorineural hearing loss, bilateral: Secondary | ICD-10-CM | POA: Diagnosis not present

## 2019-10-29 DIAGNOSIS — H5711 Ocular pain, right eye: Secondary | ICD-10-CM | POA: Diagnosis not present

## 2019-12-03 DIAGNOSIS — H353211 Exudative age-related macular degeneration, right eye, with active choroidal neovascularization: Secondary | ICD-10-CM | POA: Diagnosis not present

## 2020-01-21 DIAGNOSIS — H43811 Vitreous degeneration, right eye: Secondary | ICD-10-CM | POA: Diagnosis not present

## 2020-01-21 DIAGNOSIS — H353211 Exudative age-related macular degeneration, right eye, with active choroidal neovascularization: Secondary | ICD-10-CM | POA: Diagnosis not present

## 2020-01-26 ENCOUNTER — Telehealth: Payer: Self-pay | Admitting: Family Medicine

## 2020-01-26 NOTE — Telephone Encounter (Signed)
Called pt to schedule physical. Left VM

## 2020-02-11 ENCOUNTER — Other Ambulatory Visit: Payer: Self-pay | Admitting: Dermatology

## 2020-02-11 DIAGNOSIS — T1490XA Injury, unspecified, initial encounter: Secondary | ICD-10-CM

## 2020-02-16 DIAGNOSIS — M545 Low back pain, unspecified: Secondary | ICD-10-CM | POA: Diagnosis not present

## 2020-03-17 DIAGNOSIS — H353211 Exudative age-related macular degeneration, right eye, with active choroidal neovascularization: Secondary | ICD-10-CM | POA: Diagnosis not present

## 2020-04-29 DIAGNOSIS — H6123 Impacted cerumen, bilateral: Secondary | ICD-10-CM | POA: Diagnosis not present

## 2020-05-12 ENCOUNTER — Encounter: Payer: PPO | Admitting: Dermatology

## 2020-05-26 DIAGNOSIS — H353211 Exudative age-related macular degeneration, right eye, with active choroidal neovascularization: Secondary | ICD-10-CM | POA: Diagnosis not present

## 2020-06-16 ENCOUNTER — Other Ambulatory Visit: Payer: Self-pay | Admitting: Family Medicine

## 2020-06-16 ENCOUNTER — Ambulatory Visit: Payer: PPO | Admitting: Dermatology

## 2020-06-16 ENCOUNTER — Other Ambulatory Visit: Payer: Self-pay

## 2020-06-16 DIAGNOSIS — L570 Actinic keratosis: Secondary | ICD-10-CM | POA: Diagnosis not present

## 2020-06-16 DIAGNOSIS — L720 Epidermal cyst: Secondary | ICD-10-CM

## 2020-06-16 DIAGNOSIS — L820 Inflamed seborrheic keratosis: Secondary | ICD-10-CM | POA: Diagnosis not present

## 2020-06-16 DIAGNOSIS — L578 Other skin changes due to chronic exposure to nonionizing radiation: Secondary | ICD-10-CM

## 2020-06-16 DIAGNOSIS — L821 Other seborrheic keratosis: Secondary | ICD-10-CM | POA: Diagnosis not present

## 2020-06-16 DIAGNOSIS — D229 Melanocytic nevi, unspecified: Secondary | ICD-10-CM

## 2020-06-16 DIAGNOSIS — L814 Other melanin hyperpigmentation: Secondary | ICD-10-CM

## 2020-06-16 DIAGNOSIS — Z1283 Encounter for screening for malignant neoplasm of skin: Secondary | ICD-10-CM

## 2020-06-16 DIAGNOSIS — D1800 Hemangioma unspecified site: Secondary | ICD-10-CM | POA: Diagnosis not present

## 2020-06-16 DIAGNOSIS — Z8673 Personal history of transient ischemic attack (TIA), and cerebral infarction without residual deficits: Secondary | ICD-10-CM

## 2020-06-16 DIAGNOSIS — Z85828 Personal history of other malignant neoplasm of skin: Secondary | ICD-10-CM

## 2020-06-16 DIAGNOSIS — Z85820 Personal history of malignant melanoma of skin: Secondary | ICD-10-CM

## 2020-06-16 DIAGNOSIS — E782 Mixed hyperlipidemia: Secondary | ICD-10-CM

## 2020-06-16 NOTE — Patient Instructions (Signed)

## 2020-06-16 NOTE — Progress Notes (Signed)
Follow-Up Visit   Subjective  Cody Willis is a 80 y.o. male who presents for the following: Annual Exam (History of Melanoma and BCC - TBSE today). He has noticed irritating spots of the back and legs.  He also has rough areas on the face and scalp. The patient presents for Total-Body Skin Exam (TBSE) for skin cancer screening and mole check.  The following portions of the chart were reviewed this encounter and updated as appropriate:   Tobacco  Allergies  Meds  Problems  Med Hx  Surg Hx  Fam Hx     Review of Systems:  No other skin or systemic complaints except as noted in HPI or Assessment and Plan.  Objective  Well appearing patient in no apparent distress; mood and affect are within normal limits.  A full examination was performed including scalp, head, eyes, ears, nose, lips, neck, chest, axillae, abdomen, back, buttocks, bilateral upper extremities, bilateral lower extremities, hands, feet, fingers, toes, fingernails, and toenails. All findings within normal limits unless otherwise noted below.  Objective  Right Abdomen: Well healed scar with no evidence of recurrence, no lymphadenopathy.   Objective  Right Upper Back x 1, left thigh x 1, left pretibial x 1 (3): Erythematous keratotic or waxy stuck-on papule or plaque.   Objective  Right Upper Back medial sup scapula: 1.5 cm cystic papule  Objective  Face, ears (20): Erythematous thin papules/macules with gritty scale.    Assessment & Plan    History of Basal Cell Carcinoma of the Skin - No evidence of recurrence today - Recommend regular full body skin exams - Recommend daily broad spectrum sunscreen SPF 30+ to sun-exposed areas, reapply every 2 hours as needed.  - Call if any new or changing lesions are noted between office visits  Lentigines - Scattered tan macules - Due to sun exposure - Benign-appering, observe - Recommend daily broad spectrum sunscreen SPF 30+ to sun-exposed areas, reapply  every 2 hours as needed. - Call for any changes  Seborrheic Keratoses - Stuck-on, waxy, tan-brown papules and plaques  - Discussed benign etiology and prognosis. - Observe - Call for any changes  Melanocytic Nevi - Tan-brown and/or pink-flesh-colored symmetric macules and papules - Benign appearing on exam today - Observation - Call clinic for new or changing moles - Recommend daily use of broad spectrum spf 30+ sunscreen to sun-exposed areas.   Hemangiomas - Red papules - Discussed benign nature - Observe - Call for any changes  Actinic Damage - Chronic, secondary to cumulative UV/sun exposure - diffuse scaly erythematous macules with underlying dyspigmentation - Recommend daily broad spectrum sunscreen SPF 30+ to sun-exposed areas, reapply every 2 hours as needed.  - Call for new or changing lesions.  Skin cancer screening performed today.  History of melanoma Right Abdomen  Clear. Observe for recurrence. Call clinic for new or changing lesions.  Recommend regular skin exams, daily broad-spectrum spf 30+ sunscreen use, and photoprotection.     Inflamed seborrheic keratosis (3) Right Upper Back x 1, left thigh x 1, left pretibial x 1  Destruction of lesion - Right Upper Back x 1, left thigh x 1, left pretibial x 1 Complexity: simple   Destruction method: cryotherapy   Informed consent: discussed and consent obtained   Timeout:  patient name, date of birth, surgical site, and procedure verified Lesion destroyed using liquid nitrogen: Yes   Region frozen until ice ball extended beyond lesion: Yes   Outcome: patient tolerated procedure well with no  complications   Post-procedure details: wound care instructions given    Epidermal inclusion cyst Right Upper Back medial sup scapula  Will schedule appointment for excision due to increase in size  AK (actinic keratosis) (20) Face, ears  Destruction of lesion - Face, ears Complexity: simple   Destruction method:  cryotherapy   Informed consent: discussed and consent obtained   Timeout:  patient name, date of birth, surgical site, and procedure verified Lesion destroyed using liquid nitrogen: Yes   Region frozen until ice ball extended beyond lesion: Yes   Outcome: patient tolerated procedure well with no complications   Post-procedure details: wound care instructions given    Skin cancer screening  Return for Surgery cyst of back, 6 months sun exposed areas.   I, Ashok Cordia, CMA, am acting as scribe for Sarina Ser, MD .  Documentation: I have reviewed the above documentation for accuracy and completeness, and I agree with the above.  Sarina Ser, MD

## 2020-06-17 NOTE — Telephone Encounter (Signed)
Pt needs appt. For medicare wellness please. He should have meds to last him till may.  Thank you!

## 2020-06-17 NOTE — Telephone Encounter (Signed)
Called patient and scheduled 5/19.

## 2020-06-29 ENCOUNTER — Encounter: Payer: Self-pay | Admitting: Dermatology

## 2020-07-13 ENCOUNTER — Encounter: Payer: PPO | Admitting: Dermatology

## 2020-07-19 ENCOUNTER — Encounter: Payer: Self-pay | Admitting: Dermatology

## 2020-07-19 ENCOUNTER — Other Ambulatory Visit: Payer: Self-pay

## 2020-07-19 ENCOUNTER — Ambulatory Visit: Payer: PPO | Admitting: Dermatology

## 2020-07-19 DIAGNOSIS — L720 Epidermal cyst: Secondary | ICD-10-CM

## 2020-07-19 MED ORDER — MUPIROCIN 2 % EX OINT: 1.0000 "application " | TOPICAL_OINTMENT | Freq: Every day | CUTANEOUS | 0 refills | Status: DC

## 2020-07-19 NOTE — Progress Notes (Signed)
   Follow-Up Visit   Subjective  Cody Willis is a 80 y.o. male who presents for the following: Cyst (Vs other of right upper back medial/sup scapula - [facility]ise today). The following portions of the chart were reviewed this encounter and updated as appropriate:   Tobacco  Allergies  Meds  Problems  Med Hx  Surg Hx  Fam Hx     Review of Systems:  No other skin or systemic complaints except as noted in HPI or Assessment and Plan.  Objective  Well appearing patient in no apparent distress; mood and affect are within normal limits.  A focused examination was performed including back. Relevant physical exam findings are noted in the Assessment and Plan.  Objective  Right Upper Back medial / superior scapula: Cystic pap 2.2cm   Assessment & Plan  Epidermal cyst Right Upper Back medial / superior scapula  Cyst vs other, excised today  Start Mupirocin oint qd to excision site  Skin excision - Right Upper Back medial / superior scapula  Lesion length (cm):  2.2 Lesion width (cm):  2.2 Margin per side (cm):  0.2 Total excision diameter (cm):  2.6 Informed consent: discussed and consent obtained   Timeout: patient name, date of birth, surgical site, and procedure verified   Procedure prep:  Patient was prepped and draped in usual sterile fashion Prep type:  Isopropyl alcohol and povidone-iodine Anesthesia: the lesion was anesthetized in a standard fashion   Anesthesia comment:  6.0cc Anesthetic:  1% lidocaine w/ epinephrine 1-100,000 buffered w/ 8.4% NaHCO3 (0.5% bupivicaine buffered w/ NaHC03) Instrument used: #15 blade   Hemostasis achieved with: pressure   Hemostasis achieved with comment:  Electrocautery Outcome: patient tolerated procedure well with no complications   Post-procedure details: sterile dressing applied and wound care instructions given   Dressing type: bandage and pressure dressing (Mupirocin)    Skin repair - Right Upper Back medial / superior  scapula Complexity:  Complex Final length (cm):  3.5 Reason for type of repair: reduce tension to allow closure, reduce the risk of dehiscence, infection, and necrosis, reduce subcutaneous dead space and avoid a hematoma, allow closure of the large defect, preserve normal anatomy, preserve normal anatomical and functional relationships and enhance both functionality and cosmetic results   Undermining: area extensively undermined   Undermining comment:  Undermining Defect 2.6 cm Subcutaneous layers (deep stitches):  Suture size:  2-0 Suture type: Vicryl (polyglactin 910)   Subcutaneous suture technique: Inverted Dermal. Fine/surface layer approximation (top stitches):  Suture size:  3-0 Suture type: nylon   Stitches: simple running   Suture removal (days):  7 Hemostasis achieved with: pressure Outcome: patient tolerated procedure well with no complications   Post-procedure details: sterile dressing applied and wound care instructions given   Dressing type: bandage, pressure dressing and bacitracin (Mupirocin)    mupirocin ointment (BACTROBAN) 2 % - Right Upper Back medial / superior scapula  Specimen 1 - Surgical pathology Differential Diagnosis: D48.5 Cyst vs toehr  Check Margins: No Cystic pap 2.2cm  Return in about 1 week (around 07/26/2020) for suture removal.   I, Othelia Pulling, RMA, am acting as scribe for Sarina Ser, MD .  Documentation: I have reviewed the above documentation for accuracy and completeness, and I agree with the above.  Sarina Ser, MD

## 2020-07-19 NOTE — Patient Instructions (Signed)

## 2020-07-20 ENCOUNTER — Telehealth: Payer: Self-pay

## 2020-07-20 NOTE — Telephone Encounter (Signed)
Spoke to patient's wife and she said patient was doing fine after yesterday's surgery.Cody Willis

## 2020-07-26 ENCOUNTER — Other Ambulatory Visit: Payer: Self-pay

## 2020-07-26 ENCOUNTER — Ambulatory Visit (INDEPENDENT_AMBULATORY_CARE_PROVIDER_SITE_OTHER): Payer: PPO | Admitting: Dermatology

## 2020-07-26 DIAGNOSIS — L720 Epidermal cyst: Secondary | ICD-10-CM

## 2020-07-26 DIAGNOSIS — L570 Actinic keratosis: Secondary | ICD-10-CM | POA: Diagnosis not present

## 2020-07-26 DIAGNOSIS — Z4802 Encounter for removal of sutures: Secondary | ICD-10-CM

## 2020-07-26 NOTE — Patient Instructions (Addendum)

## 2020-07-26 NOTE — Progress Notes (Signed)
   Follow-Up Visit   Subjective  Cody Willis is a 80 y.o. male who presents for the following: Bx proven cyst (R upper back medial/sup scapula, pt presents for suture removal) and red spots (Nose, comes and goes).  The following portions of the chart were reviewed this encounter and updated as appropriate:   Tobacco  Allergies  Meds  Problems  Med Hx  Surg Hx  Fam Hx     Review of Systems:  No other skin or systemic complaints except as noted in HPI or Assessment and Plan.  Objective  Well appearing patient in no apparent distress; mood and affect are within normal limits.  A focused examination was performed including back. Relevant physical exam findings are noted in the Assessment and Plan.  Objective  Right Upper Back medial/superior scapula: Excision site healing well  Objective  R temple x 1, nose x 1 (2): Pink scaly macules    Assessment & Plan  Epidermal cyst Right Upper Back medial/superior scapula  Bx proven  Encounter for Removal of Sutures - Incision site at the R upper back medial/superior scapula is clean, dry and intact - Wound cleansed, sutures removed, wound cleansed and steri strips applied.  - Discussed pathology results showing benign cyst  - Patient advised to keep steri-strips dry until they fall off. - Scars remodel for a full year. - Once steri-strips fall off, patient can apply over-the-counter silicone scar cream each night to help with scar remodeling if desired. - Patient advised to call with any concerns or if they notice any new or changing lesions.   Other Related Medications mupirocin ointment (BACTROBAN) 2 %  AK (actinic keratosis) (2) R temple x 1, nose x 1  Destruction of lesion - R temple x 1, nose x 1 Complexity: simple   Destruction method: cryotherapy   Informed consent: discussed and consent obtained   Timeout:  patient name, date of birth, surgical site, and procedure verified Lesion destroyed using liquid  nitrogen: Yes   Region frozen until ice ball extended beyond lesion: Yes   Outcome: patient tolerated procedure well with no complications   Post-procedure details: wound care instructions given    Return for as scheduled for 27m f/u, recheck AK R temple and nose.   I, Othelia Pulling, RMA, am acting as scribe for Sarina Ser, MD .  Documentation: I have reviewed the above documentation for accuracy and completeness, and I agree with the above.  Sarina Ser, MD

## 2020-07-28 ENCOUNTER — Encounter: Payer: Self-pay | Admitting: Dermatology

## 2020-08-05 ENCOUNTER — Ambulatory Visit
Admission: EM | Admit: 2020-08-05 | Discharge: 2020-08-05 | Disposition: A | Discharge status: Home / Self Care | Payer: PPO | Attending: Emergency Medicine | Admitting: Emergency Medicine

## 2020-08-05 ENCOUNTER — Other Ambulatory Visit: Payer: Self-pay

## 2020-08-05 DIAGNOSIS — I10 Essential (primary) hypertension: Secondary | ICD-10-CM

## 2020-08-05 DIAGNOSIS — B029 Zoster without complications: Secondary | ICD-10-CM | POA: Diagnosis not present

## 2020-08-05 MED ORDER — VALACYCLOVIR HCL 1 G PO TABS: 1000.0000 mg | ORAL_TABLET | Freq: Three times a day (TID) | ORAL | 0 refills | Status: AC

## 2020-08-05 NOTE — ED Triage Notes (Signed)
Pt presents with rash to his left eyebrow x 2 days. Concern for shingles. Reports history of same. Reports rash is painful.

## 2020-08-05 NOTE — Discharge Instructions (Addendum)
Take the valacyclovir as directed.  Follow up with your primary care provider if your symptoms are not improving.    Your blood pressure is elevated today at 178/78.  Please have this rechecked by your primary care provider in 1-2 weeks.

## 2020-08-05 NOTE — ED Provider Notes (Signed)
Roderic Palau    CSN: [id number] Arrival date & time: 08/05/20  0831      History   Chief Complaint Chief Complaint  Patient presents with  . Rash    HPI Cody Willis is a 80 y.o. male.   Patient presents with painful blister-like rash in and above his left eyebrow x 2 days.  He states the rash is similar to previous episode of  "shingles."  No other rash or lesions.  He denies eye pain, changes in vision, eye drainage, fever, chills, cough, chest pain, shortness of breath, abdominal pain, dizziness, headache, or other symptoms.  No treatments attempted at home.  His medical history includes hypertension, stroke, COPD, chronic pancreatitis.  The history is provided by the patient and medical records.    Past Medical History:  Diagnosis Date  . Arthritis    fingers  . Basal cell carcinoma 02/10/2008   left top of shoulder  . Cataract    bilateral surgery repair  . Chronic pancreatitis ([place])   . Esophageal reflux   . Essential hypertension, benign   . Gastric polyps    hyperplastic  . Hiatal hernia   . Other B-complex deficiencies   . Pure hypercholesterolemia   . Stroke ([place]) 01/16/2015   Pt. started on plavix    Patient Active Problem List   Diagnosis Date Noted  . Prediabetes 05/29/2018  . Hx of melanoma excision 05/29/2018  . Leucocytosis 05/29/2018  . Proteinuria 04/29/2017  . Former smoker 10/31/2015  . Preventative health care 07/26/2015  . Advance directive discussed with patient 07/26/2015  . Pain in hand 07/25/2015  . Occipital neuralgia of right side 05/02/2015  . History of CVA (cerebrovascular accident) 03/21/2015  . Essential hypertension 03/21/2015  . HLD (hyperlipidemia)   . COPD (chronic obstructive pulmonary disease) ([place]) 08/17/2014  . CHRONIC PANCREATITIS 01/03/2010  . B12 DEFICIENCY 05/13/2009  . ESSENTIAL HYPERTENSION, BENIGN 03/22/2009  . HYPERCHOLESTEROLEMIA 07/02/2007  . ACID REFLUX DISEASE 07/02/2007    Past Surgical  History:  Procedure Laterality Date  . APPENDECTOMY    . BACK SURGERY     Diskectomy between L4 and L5  . CATARACT EXTRACTION W/ INTRAOCULAR LENS  IMPLANT, BILATERAL  12/15 and 2/16  . CHOLECYSTECTOMY    . COLONOSCOPY  2011  . MELANOMA EXCISION  ~2009   right upper abdomen       Home Medications    Prior to Admission medications   Medication Sig Start Date End Date Taking? Authorizing Provider  valACYclovir (VALTREX) 1000 MG tablet Take 1 tablet (1,000 mg total) by mouth 3 (three) times daily for 7 days. 08/05/20 08/12/20 Yes Sharion Balloon, NP  aspirin EC 325 MG tablet Take 1 tablet (325 mg total) by mouth daily. 07/25/15   Rosalin Hawking, MD  cyanocobalamin (,VITAMIN B-12,) 1000 MCG/ML injection INJECT 1ML INTO THE MUSCLE EVERY 30 DAYS. MUST HAVE OFFICE VISIT FOR FURTHER REFILLS 07/17/19   Pyrtle, Lajuan Lines, MD  lovastatin (MEVACOR) 40 MG tablet TAKE 1 TABLET BY MOUTH ONCE DAILY AT BEDTIME 08/20/19   Lesleigh Noe, MD  mupirocin ointment (BACTROBAN) 2 % APPLY DAILY TO ANY SORES OR INJURIES 02/11/20   Ralene Bathe, MD  mupirocin ointment (BACTROBAN) 2 % Apply 1 application topically daily. Qd to excision site 07/19/20   Ralene Bathe, MD  Pancrelipase, Lip-Prot-Amyl, ([name]) 25000-79000 units CPEP Take 2 capsules by mouth 3 (three) times daily with meals. 07/17/19   Pyrtle, Lajuan Lines, MD  pantoprazole ([place])  40 MG tablet Take 1 tablet (40 mg total) by mouth every morning. 07/17/19   Pyrtle, Lajuan Lines, MD  triamcinolone (KENALOG) 0.025 % ointment daily as needed for rash. 03/27/18   [provider]    Family History Family History  Problem Relation Age of Onset  . Stroke Mother   . Diabetes Mother   . Emphysema Father        smoker  . Crohn's disease Sister   . Colitis Brother   . Colon cancer Neg Hx   . Colon polyps Neg Hx   . Esophageal cancer Neg Hx   . Rectal cancer Neg Hx   . Stomach cancer Neg Hx     Social History Social History   Tobacco Use  . Smoking status:  Former Smoker    Packs/day: 1.00    Years: 50.00    Pack years: 50.00    Types: Cigarettes    Quit date: 01/16/2015    Years since quitting: 5.5  . Smokeless tobacco: Never Used  . Tobacco comment: Counseling sheet given in exam room to quit smoking   Vaping Use  . Vaping Use: Never used  Substance Use Topics  . Alcohol use: No    Alcohol/week: 0.0 standard drinks  . Drug use: No     Allergies   Plavix [clopidogrel bisulfate] and Sulfamethoxazole-trimethoprim   Review of Systems Review of Systems  Constitutional: Negative for chills and fever.  HENT: Negative for ear pain and sore throat.   Eyes: Negative for pain, discharge and visual disturbance.  Respiratory: Negative for cough and shortness of breath.   Cardiovascular: Negative for chest pain and palpitations.  Gastrointestinal: Negative for abdominal pain and vomiting.  Genitourinary: Negative for dysuria and hematuria.  Musculoskeletal: Negative for arthralgias and back pain.  Skin: Positive for rash. Negative for color change.  Neurological: Negative for seizures and syncope.  All other systems reviewed and are negative.    Physical Exam Triage Vital Signs ED Triage Vitals  Enc Vitals Group     BP      Pulse      Resp      Temp      Temp src      SpO2      Weight      Height      Head Circumference      Peak Flow      Pain Score      Pain Loc      Pain Edu?      Excl. in [place]?    No data found.  Updated Vital Signs BP (!) 178/78   Pulse 60   Temp 98 F (36.7 C)   Resp 19   SpO2 97%   Visual Acuity Right Eye Distance:   Left Eye Distance:   Bilateral Distance:    Right Eye Near:   Left Eye Near:    Bilateral Near:     Physical Exam Vitals and nursing note reviewed.  Constitutional:      General: He is not in acute distress.    Appearance: He is well-developed. He is not ill-appearing.  HENT:     Head: Normocephalic and atraumatic.     Mouth/Throat:     Mouth: Mucous membranes are  moist.  Eyes:     Extraocular Movements: Extraocular movements intact.     Conjunctiva/sclera: Conjunctivae normal.     Pupils: Pupils are equal, round, and reactive to light.  Cardiovascular:     Rate  and Rhythm: Normal rate and regular rhythm.     Heart sounds: Normal heart sounds.  Pulmonary:     Effort: Pulmonary effort is normal. No respiratory distress.     Breath sounds: Normal breath sounds.  Abdominal:     Palpations: Abdomen is soft.     Tenderness: There is no abdominal tenderness.  Musculoskeletal:     Cervical back: Neck supple.  Skin:    General: Skin is warm and dry.     Findings: Rash present.     Comments: Vesicular rash in an above left eyebrow.    Neurological:     General: No focal deficit present.     Mental Status: He is alert and oriented to person, place, and time.     Gait: Gait normal.  Psychiatric:        Mood and Affect: Mood normal.        Behavior: Behavior normal.      UC Treatments / Results  Labs (all labs ordered are listed, but only abnormal results are displayed) Labs Reviewed - No data to display  EKG   Radiology No results found.  Procedures Procedures (including critical care time)  Medications Ordered in UC Medications - No data to display  Initial Impression / Assessment and Plan / UC Course  I have reviewed the triage vital signs and the nursing notes.  Pertinent labs & imaging results that were available during my care of the patient were reviewed by me and considered in my medical decision making (see chart for details).   Herpes zoster; Elevated blood pressure with known hypertension.  Rash is consistent with shingles.  Treating with valacyclovir x7 days.  Instructed patient to follow-up with his PCP if his symptoms are not improving.  Also discussed that his blood pressure is elevated today and needs to be rechecked by his PCP in 1 to 2 weeks.  He reports no symptoms related to elevated blood pressure.  Education  provided on managing hypertension.  Patient agrees to plan of care.   Final Clinical Impressions(s) / UC Diagnoses   Final diagnoses:  Herpes zoster without complication  Elevated blood pressure reading in office with diagnosis of hypertension     Discharge Instructions     Take the valacyclovir as directed.  Follow up with your primary care provider if your symptoms are not improving.    Your blood pressure is elevated today at 178/78.  Please have this rechecked by your primary care provider in 1-2 weeks.         ED Prescriptions    Medication Sig Dispense Auth. Provider   valACYclovir (VALTREX) 1000 MG tablet Take 1 tablet (1,000 mg total) by mouth 3 (three) times daily for 7 days. 21 tablet Sharion Balloon, NP     PDMP not reviewed this encounter.   Sharion Balloon, NP 08/05/20 [phone]

## 2020-08-18 DIAGNOSIS — H353211 Exudative age-related macular degeneration, right eye, with active choroidal neovascularization: Secondary | ICD-10-CM | POA: Diagnosis not present

## 2020-08-19 ENCOUNTER — Other Ambulatory Visit: Payer: Self-pay

## 2020-08-19 ENCOUNTER — Ambulatory Visit
Admission: EM | Admit: 2020-08-19 | Discharge: 2020-08-19 | Disposition: A | Discharge status: Home / Self Care | Payer: PPO | Attending: Emergency Medicine | Admitting: Emergency Medicine

## 2020-08-19 DIAGNOSIS — Z1152 Encounter for screening for COVID-19: Secondary | ICD-10-CM

## 2020-08-19 DIAGNOSIS — I10 Essential (primary) hypertension: Secondary | ICD-10-CM | POA: Diagnosis not present

## 2020-08-19 DIAGNOSIS — J069 Acute upper respiratory infection, unspecified: Secondary | ICD-10-CM | POA: Diagnosis not present

## 2020-08-19 MED ORDER — BENZONATATE 100 MG PO CAPS: 100.0000 mg | ORAL_CAPSULE | Freq: Three times a day (TID) | ORAL | 0 refills | Status: DC | PRN

## 2020-08-19 NOTE — Discharge Instructions (Addendum)
Take the Tessalon as needed for cough.  Your COVID test is pending.  You should self quarantine until the test result is back.  Follow up with your primary care provider if your symptoms are not improving.    Your blood pressure is elevated today at 151/71.  Please have this rechecked by your primary care provider in 2-4 weeks.

## 2020-08-19 NOTE — ED Provider Notes (Signed)
Roderic Palau    CSN: [id number] Arrival date & time: 08/19/20  2831      History   Chief Complaint Chief Complaint  Patient presents with  . Cough    HPI Cody Willis is a 80 y.o. male.   Patient presents with 5-day history of nasal congestion, postnasal drip, cough productive of clear phlegm.  He denies fever, rash, shortness of breath, vomiting, diarrhea, or other symptoms.  OTC treatment at home.  His medical history includes COPD, hypertension, stroke, prediabetes, chronic pancreatitis, GERD.  The history is provided by the patient and medical records.    Past Medical History:  Diagnosis Date  . Arthritis    fingers  . Basal cell carcinoma 02/10/2008   left top of shoulder  . Cataract    bilateral surgery repair  . Chronic pancreatitis ([place])   . Esophageal reflux   . Essential hypertension, benign   . Gastric polyps    hyperplastic  . Hiatal hernia   . Other B-complex deficiencies   . Pure hypercholesterolemia   . Stroke ([place]) 01/16/2015   Pt. started on plavix    Patient Active Problem List   Diagnosis Date Noted  . Prediabetes 05/29/2018  . Hx of melanoma excision 05/29/2018  . Leucocytosis 05/29/2018  . Proteinuria 04/29/2017  . Former smoker 10/31/2015  . Preventative health care 07/26/2015  . Advance directive discussed with patient 07/26/2015  . Pain in hand 07/25/2015  . Occipital neuralgia of right side 05/02/2015  . History of CVA (cerebrovascular accident) 03/21/2015  . Essential hypertension 03/21/2015  . HLD (hyperlipidemia)   . COPD (chronic obstructive pulmonary disease) ([place]) 08/17/2014  . CHRONIC PANCREATITIS 01/03/2010  . B12 DEFICIENCY 05/13/2009  . ESSENTIAL HYPERTENSION, BENIGN 03/22/2009  . HYPERCHOLESTEROLEMIA 07/02/2007  . ACID REFLUX DISEASE 07/02/2007    Past Surgical History:  Procedure Laterality Date  . APPENDECTOMY    . BACK SURGERY     Diskectomy between L4 and L5  . CATARACT EXTRACTION W/ INTRAOCULAR  LENS  IMPLANT, BILATERAL  12/15 and 2/16  . CHOLECYSTECTOMY    . COLONOSCOPY  2011  . MELANOMA EXCISION  ~2009   right upper abdomen       Home Medications    Prior to Admission medications   Medication Sig Start Date End Date Taking? Authorizing Provider  benzonatate (TESSALON) 100 MG capsule Take 1 capsule (100 mg total) by mouth 3 (three) times daily as needed for cough. 08/19/20  Yes Sharion Balloon, NP  aspirin EC 325 MG tablet Take 1 tablet (325 mg total) by mouth daily. 07/25/15   Rosalin Hawking, MD  cyanocobalamin (,VITAMIN B-12,) 1000 MCG/ML injection INJECT 1ML INTO THE MUSCLE EVERY 30 DAYS. MUST HAVE OFFICE VISIT FOR FURTHER REFILLS 07/17/19   Pyrtle, Lajuan Lines, MD  lovastatin (MEVACOR) 40 MG tablet TAKE 1 TABLET BY MOUTH ONCE DAILY AT BEDTIME 08/20/19   Lesleigh Noe, MD  mupirocin ointment (BACTROBAN) 2 % APPLY DAILY TO ANY SORES OR INJURIES 02/11/20   Ralene Bathe, MD  mupirocin ointment (BACTROBAN) 2 % Apply 1 application topically daily. Qd to excision site 07/19/20   Ralene Bathe, MD  Pancrelipase, Lip-Prot-Amyl, ([name]) 25000-79000 units CPEP Take 2 capsules by mouth 3 (three) times daily with meals. 07/17/19   Pyrtle, Lajuan Lines, MD  pantoprazole (PROTONIX) 40 MG tablet Take 1 tablet (40 mg total) by mouth every morning. 07/17/19   Pyrtle, Lajuan Lines, MD  triamcinolone (KENALOG) 0.025 % ointment daily as needed  for rash. 03/27/18   [provider]    Family History Family History  Problem Relation Age of Onset  . Stroke Mother   . Diabetes Mother   . Emphysema Father        smoker  . Crohn's disease Sister   . Colitis Brother   . Colon cancer Neg Hx   . Colon polyps Neg Hx   . Esophageal cancer Neg Hx   . Rectal cancer Neg Hx   . Stomach cancer Neg Hx     Social History Social History   Tobacco Use  . Smoking status: Former Smoker    Packs/day: 1.00    Years: 50.00    Pack years: 50.00    Types: Cigarettes    Quit date: 01/16/2015    Years since quitting:  5.5  . Smokeless tobacco: Never Used  . Tobacco comment: Counseling sheet given in exam room to quit smoking   Vaping Use  . Vaping Use: Never used  Substance Use Topics  . Alcohol use: No    Alcohol/week: 0.0 standard drinks  . Drug use: No     Allergies   Plavix [clopidogrel bisulfate] and Sulfamethoxazole-trimethoprim   Review of Systems Review of Systems  Constitutional: Negative for chills and fever.  HENT: Positive for congestion and postnasal drip. Negative for ear pain and sore throat.   Respiratory: Positive for cough. Negative for shortness of breath.   Cardiovascular: Negative for chest pain and palpitations.  Gastrointestinal: Negative for abdominal pain, diarrhea and vomiting.  Skin: Negative for color change and rash.  All other systems reviewed and are negative.    Physical Exam Triage Vital Signs ED Triage Vitals  Enc Vitals Group     BP      Pulse      Resp      Temp      Temp src      SpO2      Weight      Height      Head Circumference      Peak Flow      Pain Score      Pain Loc      Pain Edu?      Excl. in [place]?    No data found.  Updated Vital Signs BP (!) 151/71   Pulse 86   Temp 98.3 F (36.8 C)   Resp 18   Ht 5\' 5"  (1.651 m)   Wt 175 lb (79.4 kg)   BMI 29.12 kg/m   Visual Acuity Right Eye Distance:   Left Eye Distance:   Bilateral Distance:    Right Eye Near:   Left Eye Near:    Bilateral Near:     Physical Exam Vitals and nursing note reviewed.  Constitutional:      General: He is not in acute distress.    Appearance: He is well-developed. He is not ill-appearing.  HENT:     Head: Normocephalic and atraumatic.     Right Ear: Tympanic membrane normal.     Left Ear: Tympanic membrane normal.     Nose: Nose normal.     Mouth/Throat:     Mouth: Mucous membranes are moist.     Pharynx: Oropharynx is clear.     Comments: Clear postnasal drip. Eyes:     Conjunctiva/sclera: Conjunctivae normal.  Cardiovascular:      Rate and Rhythm: Normal rate and regular rhythm.     Heart sounds: Normal heart sounds.  Pulmonary:  Effort: Pulmonary effort is normal. No respiratory distress.     Breath sounds: Normal breath sounds.  Abdominal:     Palpations: Abdomen is soft.     Tenderness: There is no abdominal tenderness.  Musculoskeletal:     Cervical back: Neck supple.  Skin:    General: Skin is warm and dry.  Neurological:     General: No focal deficit present.     Mental Status: He is alert and oriented to person, place, and time.     Gait: Gait normal.  Psychiatric:        Mood and Affect: Mood normal.        Behavior: Behavior normal.      UC Treatments / Results  Labs (all labs ordered are listed, but only abnormal results are displayed) Labs Reviewed  NOVEL CORONAVIRUS, NAA    EKG   Radiology No results found.  Procedures Procedures (including critical care time)  Medications Ordered in UC Medications - No data to display  Initial Impression / Assessment and Plan / UC Course  I have reviewed the triage vital signs and the nursing notes.  Pertinent labs & imaging results that were available during my care of the patient were reviewed by me and considered in my medical decision making (see chart for details).   Viral URI with cough.  Elevated blood pressure reading with known hypertension.  Patient is well-appearing and his exam is reassuring.  Treating cough with Tessalon [place].  PCR COVID pending.  Instructed patient to self quarantine until the test result is back.  Instructed him to follow-up with his PCP if his symptoms are not improving.  Also discussed that his blood pressure is elevated today and needs to be rechecked by his PCP in 2 to 4 weeks.  Education provided on managing hypertension.  Patient agrees to plan of care.   Final Clinical Impressions(s) / UC Diagnoses   Final diagnoses:  Encounter for screening for COVID-19  Viral URI with cough  Elevated blood  pressure reading in office with diagnosis of hypertension     Discharge Instructions     Take the Tessalon as needed for cough.  Your COVID test is pending.  You should self quarantine until the test result is back.  Follow up with your primary care provider if your symptoms are not improving.    Your blood pressure is elevated today at 151/71.  Please have this rechecked by your primary care provider in 2-4 weeks.           ED Prescriptions    Medication Sig Dispense Auth. Provider   benzonatate (TESSALON) 100 MG capsule Take 1 capsule (100 mg total) by mouth 3 (three) times daily as needed for cough. 21 capsule Sharion Balloon, NP     PDMP not reviewed this encounter.   Sharion Balloon, NP 08/19/20 [phone]

## 2020-08-19 NOTE — ED Triage Notes (Signed)
Pt reports having productive cough and nasal congestion that began on Monday. No other symptoms or concerns.

## 2020-08-20 LAB — NOVEL CORONAVIRUS, NAA: SARS-CoV-2, NAA: NOT DETECTED

## 2020-08-20 LAB — SARS-COV-2, NAA 2 DAY TAT

## 2020-08-24 ENCOUNTER — Other Ambulatory Visit: Payer: Self-pay

## 2020-08-24 ENCOUNTER — Ambulatory Visit (INDEPENDENT_AMBULATORY_CARE_PROVIDER_SITE_OTHER): Payer: PPO

## 2020-08-24 ENCOUNTER — Ambulatory Visit (HOSPITAL_COMMUNITY)
Admission: EM | Admit: 2020-08-24 | Discharge: 2020-08-24 | Disposition: A | Discharge status: Home / Self Care | Payer: PPO | Attending: Medical Oncology | Admitting: Medical Oncology

## 2020-08-24 ENCOUNTER — Telehealth: Payer: Self-pay | Admitting: Family Medicine

## 2020-08-24 ENCOUNTER — Encounter (HOSPITAL_COMMUNITY): Payer: Self-pay

## 2020-08-24 DIAGNOSIS — J441 Chronic obstructive pulmonary disease with (acute) exacerbation: Secondary | ICD-10-CM | POA: Diagnosis not present

## 2020-08-24 DIAGNOSIS — U071 COVID-19: Secondary | ICD-10-CM | POA: Insufficient documentation

## 2020-08-24 DIAGNOSIS — R059 Cough, unspecified: Secondary | ICD-10-CM

## 2020-08-24 LAB — BASIC METABOLIC PANEL
Anion gap: 6 (ref 5–15)
BUN: 20 mg/dL (ref 8–23)
CO2: 32 mmol/L (ref 22–32)
Calcium: 9.1 mg/dL (ref 8.9–10.3)
Chloride: 101 mmol/L (ref 98–111)
Creatinine, Ser: 1.04 mg/dL (ref 0.61–1.24)
GFR, Estimated: >60 mL/min (ref >60)
Glucose, Bld: 94 mg/dL (ref 70–99)
Potassium: 4.6 mmol/L (ref 3.5–5.1)
Sodium: 139 mmol/L (ref 135–145)

## 2020-08-24 LAB — CBC
HCT: 41.1 % (ref 39.0–52.0)
Hemoglobin: 13.5 g/dL (ref 13.0–17.0)
MCH: 31.1 pg (ref 26.0–34.0)
MCHC: 32.8 g/dL (ref 30.0–36.0)
MCV: 94.7 fL (ref 80.0–100.0)
Platelets: 196 10*3/uL (ref 150–400)
RBC: 4.34 MIL/uL (ref 4.22–5.81)
RDW: 14.6 % (ref 11.5–15.5)
WBC: 19.3 10*3/uL — ABNORMAL HIGH (ref 4.0–10.5)
nRBC: 0 % (ref 0.0–0.2)

## 2020-08-24 MED ORDER — AZITHROMYCIN 250 MG PO TABS: ORAL_TABLET | ORAL | 0 refills | Status: DC

## 2020-08-24 MED ORDER — PREDNISONE 10 MG PO TABS: ORAL_TABLET | ORAL | 0 refills | Status: DC

## 2020-08-24 NOTE — Telephone Encounter (Signed)
  LAST APPOINTMENT DATE: 06/16/2020   NEXT APPOINTMENT DATE:@7 /14/2022  MEDICATION: lovastatin  [place]  Let patient know to contact pharmacy at the end of the day to make sure medication is ready.  Please notify patient to allow 48-72 hours to process  Encourage patient to contact the pharmacy for refills or they can request refills through [facility]:   LAST REFILL:  QTY:  REFILL DATE:    OTHER COMMENTS:    Okay for refill?  Please advise

## 2020-08-24 NOTE — ED Provider Notes (Signed)
[place]    CSN: [id number] Arrival date & time: 08/24/20  0957      History   Chief Complaint Chief Complaint  Patient presents with  . Cough    HPI Cody Willis is a 80 y.o. male.   HPI   Cough: Pt reports that he was diagnosed with cough and cold symptoms one week ago. He has tried OTC medications but feels that he is getting worse. He feels that he has been disorientated at times and believes that he may have pneumonia. He does have a history of COPD which is well controlled. He has been using tessalon for symptoms but has been coughing up yellow sputum.     Past Medical History:  Diagnosis Date  . Arthritis    fingers  . Basal cell carcinoma 02/10/2008   left top of shoulder  . Cataract    bilateral surgery repair  . Chronic pancreatitis ([place])   . Esophageal reflux   . Essential hypertension, benign   . Gastric polyps    hyperplastic  . Hiatal hernia   . Other B-complex deficiencies   . Pure hypercholesterolemia   . Stroke ([place]) 01/16/2015   Pt. started on plavix    Patient Active Problem List   Diagnosis Date Noted  . Prediabetes 05/29/2018  . Hx of melanoma excision 05/29/2018  . Leucocytosis 05/29/2018  . Proteinuria 04/29/2017  . Former smoker 10/31/2015  . Preventative health care 07/26/2015  . Advance directive discussed with patient 07/26/2015  . Pain in hand 07/25/2015  . Occipital neuralgia of right side 05/02/2015  . History of CVA (cerebrovascular accident) 03/21/2015  . Essential hypertension 03/21/2015  . HLD (hyperlipidemia)   . COPD (chronic obstructive pulmonary disease) ([place]) 08/17/2014  . CHRONIC PANCREATITIS 01/03/2010  . B12 DEFICIENCY 05/13/2009  . ESSENTIAL HYPERTENSION, BENIGN 03/22/2009  . HYPERCHOLESTEROLEMIA 07/02/2007  . ACID REFLUX DISEASE 07/02/2007    Past Surgical History:  Procedure Laterality Date  . APPENDECTOMY    . BACK SURGERY     Diskectomy between L4 and L5  . CATARACT EXTRACTION W/  INTRAOCULAR LENS  IMPLANT, BILATERAL  12/15 and 2/16  . CHOLECYSTECTOMY    . COLONOSCOPY  2011  . MELANOMA EXCISION  ~2009   right upper abdomen       Home Medications    Prior to Admission medications   Medication Sig Start Date End Date Taking? Authorizing Provider  aspirin EC 325 MG tablet Take 1 tablet (325 mg total) by mouth daily. 07/25/15   Rosalin Hawking, MD  benzonatate (TESSALON) 100 MG capsule Take 1 capsule (100 mg total) by mouth 3 (three) times daily as needed for cough. 08/19/20   Sharion Balloon, NP  cyanocobalamin (,VITAMIN B-12,) 1000 MCG/ML injection INJECT 1ML INTO THE MUSCLE EVERY 30 DAYS. MUST HAVE OFFICE VISIT FOR FURTHER REFILLS 07/17/19   Pyrtle, Lajuan Lines, MD  lovastatin (MEVACOR) 40 MG tablet TAKE 1 TABLET BY MOUTH ONCE DAILY AT BEDTIME 08/20/19   Lesleigh Noe, MD  mupirocin ointment (BACTROBAN) 2 % APPLY DAILY TO ANY SORES OR INJURIES 02/11/20   Ralene Bathe, MD  mupirocin ointment (BACTROBAN) 2 % Apply 1 application topically daily. Qd to excision site 07/19/20   Ralene Bathe, MD  Pancrelipase, Lip-Prot-Amyl, ([name]) 25000-79000 units CPEP Take 2 capsules by mouth 3 (three) times daily with meals. 07/17/19   Pyrtle, Lajuan Lines, MD  pantoprazole (PROTONIX) 40 MG tablet Take 1 tablet (40 mg total) by mouth every morning.  07/17/19   Pyrtle, Lajuan Lines, MD  triamcinolone (KENALOG) 0.025 % ointment daily as needed for rash. 03/27/18   [provider]    Family History Family History  Problem Relation Age of Onset  . Stroke Mother   . Diabetes Mother   . Emphysema Father        smoker  . Crohn's disease Sister   . Colitis Brother   . Colon cancer Neg Hx   . Colon polyps Neg Hx   . Esophageal cancer Neg Hx   . Rectal cancer Neg Hx   . Stomach cancer Neg Hx     Social History Social History   Tobacco Use  . Smoking status: Former Smoker    Packs/day: 1.00    Years: 50.00    Pack years: 50.00    Types: Cigarettes    Quit date: 01/16/2015    Years since  quitting: 5.6  . Smokeless tobacco: Never Used  . Tobacco comment: Counseling sheet given in exam room to quit smoking   Vaping Use  . Vaping Use: Never used  Substance Use Topics  . Alcohol use: No    Alcohol/week: 0.0 standard drinks  . Drug use: No     Allergies   Plavix [clopidogrel bisulfate] and Sulfamethoxazole-trimethoprim   Review of Systems Review of Systems  As stated above in HPI Physical Exam Triage Vital Signs ED Triage Vitals  Enc Vitals Group     BP 08/24/20 1119 (!) 153/54     Pulse Rate 08/24/20 1117 85     Resp 08/24/20 1117 16     Temp 08/24/20 1117 97.8 F (36.6 C)     Temp Source 08/24/20 1117 Oral     SpO2 08/24/20 1117 96 %     Weight --      Height --      Head Circumference --      Peak Flow --      Pain Score 08/24/20 1116 6     Pain Loc --      Pain Edu? --      Excl. in [place]? --    No data found.  Updated Vital Signs BP (!) 153/54   Pulse 85   Temp 97.8 F (36.6 C) (Oral)   Resp 16   SpO2 96%   Physical Exam Vitals and nursing note reviewed.  Constitutional:      General: He is not in acute distress.    Appearance: Normal appearance. He is not ill-appearing, toxic-appearing or diaphoretic.  HENT:     Head: Normocephalic and atraumatic.     Right Ear: Tympanic membrane, ear canal and external ear normal.     Left Ear: Tympanic membrane, ear canal and external ear normal.     Nose: Nose normal.     Mouth/Throat:     Mouth: Mucous membranes are dry.  Eyes:     Extraocular Movements: Extraocular movements intact.     Pupils: Pupils are equal, round, and reactive to light.  Cardiovascular:     Rate and Rhythm: Normal rate and regular rhythm.     Pulses: Normal pulses.     Heart sounds: Normal heart sounds.  Pulmonary:     Effort: Respiratory distress: right middle lobe.     Breath sounds: Wheezing and rhonchi (right middle lobe) present.  Musculoskeletal:     Cervical back: Neck supple.  Lymphadenopathy:     Cervical: No  cervical adenopathy.  Skin:    General: Skin is warm.  Neurological:     Mental Status: He is alert and oriented to person, place, and time.     Cranial Nerves: No cranial nerve deficit.     Sensory: No sensory deficit.     Motor: No weakness.     Gait: Gait normal.  Psychiatric:        Mood and Affect: Mood normal.        Behavior: Behavior normal.        Thought Content: Thought content normal.        Judgment: Judgment normal.      UC Treatments / Results  Labs (all labs ordered are listed, but only abnormal results are displayed) Labs Reviewed - No data to display  EKG   Radiology No results found.  Procedures Procedures (including critical care time)  Medications Ordered in UC Medications - No data to display  Initial Impression / Assessment and Plan / UC Course  I have reviewed the triage vital signs and the nursing notes.  Pertinent labs & imaging results that were available during my care of the patient were reviewed by me and considered in my medical decision making (see chart for details).     New. Treating for COPD exacerbation with prednisone and azithromycin which he reports has worked well for him 4 years ago we he had similar symptoms. Discussed how to take along with common side effects and precautions. Azithromycin will also cover for atypical pneumonia given his HPI and physical exam findings.    Final Clinical Impressions(s) / UC Diagnoses   Final diagnoses:  None   Discharge Instructions   None    ED Prescriptions    None     PDMP not reviewed this encounter.   Hughie Closs, [place] 08/24/20 1232

## 2020-08-24 NOTE — ED Triage Notes (Signed)
Pt c/o cough x 1 week.  Pt states he has taken OTC medicine and was tested for COVID. He states he has been getting worse. He states he has been disoriented and states he might have pneumonia.

## 2020-08-25 ENCOUNTER — Telehealth: Payer: PPO | Admitting: Family Medicine

## 2020-08-25 ENCOUNTER — Other Ambulatory Visit: Payer: Self-pay

## 2020-08-25 DIAGNOSIS — E782 Mixed hyperlipidemia: Secondary | ICD-10-CM

## 2020-08-25 DIAGNOSIS — Z8673 Personal history of transient ischemic attack (TIA), and cerebral infarction without residual deficits: Secondary | ICD-10-CM

## 2020-08-25 MED ORDER — LOVASTATIN 40 MG PO TABS: ORAL_TABLET | ORAL | 0 refills | Status: DC

## 2020-08-25 NOTE — Telephone Encounter (Signed)
Refill sent in for 90 days to [facility]

## 2020-08-26 ENCOUNTER — Other Ambulatory Visit: Payer: Self-pay | Admitting: Internal Medicine

## 2020-09-29 ENCOUNTER — Ambulatory Visit: Payer: PPO | Admitting: Internal Medicine

## 2020-09-29 ENCOUNTER — Other Ambulatory Visit: Payer: Self-pay

## 2020-09-29 ENCOUNTER — Encounter: Payer: Self-pay | Admitting: Internal Medicine

## 2020-09-29 ENCOUNTER — Other Ambulatory Visit: Payer: PPO

## 2020-09-29 VITALS — BP 124/62 | HR 66 | Ht 66.5 in | Wt 164.0 lb

## 2020-09-29 DIAGNOSIS — Z87891 Personal history of nicotine dependence: Secondary | ICD-10-CM

## 2020-09-29 DIAGNOSIS — R0609 Other forms of dyspnea: Secondary | ICD-10-CM

## 2020-09-29 DIAGNOSIS — Z8709 Personal history of other diseases of the respiratory system: Secondary | ICD-10-CM | POA: Diagnosis not present

## 2020-09-29 DIAGNOSIS — R0600 Dyspnea, unspecified: Secondary | ICD-10-CM | POA: Diagnosis not present

## 2020-09-29 NOTE — Patient Instructions (Signed)
ICD-10-CM   1. DOE (dyspnea on exertion)  R06.00     2. Stopped smoking with greater than 40 pack year history  Z87.891     3. History of frequent upper respiratory infection  Z87.09       Likel have mild copd but need to confirm this and also rule out Glad your are not smoking anymore  Do PFT Do HRCT supine and prone Do blood test for alpha 1 AT phenotype  Do above in [place]  [place]  - return to see NP in few to several weeks but after completting aboe

## 2020-09-29 NOTE — Progress Notes (Signed)
OV 09/29/2020  Subjective:  Patient ID: Cody Willis, male , DOB: Feb 06, 1941 , age 80 y.o. , MRN: [id number] , ADDRESS: [address] [place] [address] PCP Lesleigh Noe, MD Patient Care Team: Lesleigh Noe, MD as PCP - General (Family Medicine)  This Provider for this visit: Treatment Team:  Attending Provider: Brand Males, MD    09/29/2020 -   Chief Complaint  Patient presents with   Consult    Pt is a self referral consult for COPD.  Pt states about a month ago he had a upper respiratory infection and was also told then that he had COPD. Pt states that he does have SOB due to age and if he exerts himself.     HPI Cody Willis 80 y.o.  -has been referred by the emergency department for pulm evaluation for possible COPD.  He presents with his wife.  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  They have a mutual friend called Cody Willis who is also my patient.  He tells me that he quit smoking 5 years ago.  He has a 50 pack smoking history.  He is quite active and can climb stairs and do heavy work with just very mild shortness of breath or no shortness of breath.  There is no associated chest pain cough or wheezing.  However every 3 years he gets a respiratory exacerbation that then makes him require urgent care visit and antibiotic and prednisone.  Most recent ones were on 08/19/2020 and 08/24/2020.  After the onset of this while driving he was so sick that he felt he had a brain fog episode and could not focus and had to pull out.  This made him really scared.  He went to the emergency department and they told him that he had COPD and therefore has been referred here.  He is not on any inhaler therapy at this point.  Otherwise feeling good right now.  Between episodes actually feels good.  Walking desaturation test is normal except for tachycardia.  He has not had a PFT or CT scan of the chest.  Of note on 08/24/2020 he  had a chest x-ray that I personally visualized and it shows hyperinflation  Social: The child is adopting a grandchild from [place] and they are happy about that and to share that with me because I am of [place] heritage.  They prefer to get all test done in [place] instead of [place].  They do not trust the healthcare in [place]   Simple office walk 185 feet x  3 laps goal with forehead probe 09/29/2020   O2 used RA  Number laps completed 3  Comments about pace Avg pace  Resting Pulse Ox/HR 99% and 66/min  Final Pulse Ox/HR 98% and 94/min  Desaturated </= 88% no  Desaturated <= 3% points no  Got Tachycardic >/= 90/min yes  Symptoms at end of test No complaints  Miscellaneous comments x     CT Chest data  No results found.    PFT  No flowsheet data found.  Echo 2016  Study Conclusions   - Left ventricle: The cavity size was normal. Wall thickness was    normal. Systolic function was normal. The estimated ejection    fraction was in the range of 60% to 65%. Wall motion was normal;    there were no regional wall motion abnormalities. Left  ventricular diastolic function parameters were normal.  - Pulmonary arteries: PA peak pressure: 33 mm Hg (S).  BLOOD  Results for Cody, Willis (MRN [id number]) as of 09/29/2020 15:32  Ref. Range 08/24/2020 12:26  Creatinine Latest Ref Range: 0.61 - 1.24 mg/dL 1.04  Results for Cody, Willis (MRN [id number]) as of 09/29/2020 15:32  Ref. Range 08/24/2020 12:26  Hemoglobin Latest Ref Range: 13.0 - 17.0 g/dL 13.5     has a past medical history of Arthritis, Basal cell carcinoma (02/10/2008), Cataract, Chronic pancreatitis ([place]), Esophageal reflux, Essential hypertension, benign, Gastric polyps, Hiatal hernia, Other B-complex deficiencies, Pure hypercholesterolemia, and Stroke ([place]) (01/16/2015).   reports that he quit smoking about 5 years ago. His smoking use included cigarettes. He has a 50.00 pack-year smoking history. He  has never used smokeless tobacco.  Past Surgical History:  Procedure Laterality Date   APPENDECTOMY     BACK SURGERY     Diskectomy between L4 and L5   CATARACT EXTRACTION W/ INTRAOCULAR LENS  IMPLANT, BILATERAL  12/15 and 2/16   CHOLECYSTECTOMY     COLONOSCOPY  2011   MELANOMA EXCISION  ~2009   right upper abdomen    Allergies  Allergen Reactions   Plavix [Clopidogrel Bisulfate] Itching   Sulfamethoxazole-Trimethoprim Rash    Immunization History  Administered Date(s) Administered   PFIZER(Purple Top)SARS-COV-2 Vaccination 06/27/2019, 07/18/2019, 02/22/2020   Pneumococcal Conjugate-13 03/09/2014   Pneumococcal Polysaccharide-23 04/30/2015   Td 04/09/2009    Family History  Problem Relation Age of Onset   Stroke Mother    Diabetes Mother    Emphysema Father        smoker   Crohn's disease Sister    Colitis Brother    Colon cancer Neg Hx    Colon polyps Neg Hx    Esophageal cancer Neg Hx    Rectal cancer Neg Hx    Stomach cancer Neg Hx      Current Outpatient Medications:    aspirin EC 325 MG tablet, Take 1 tablet (325 mg total) by mouth daily., Disp: 90 tablet, Rfl: 3   cyanocobalamin (,VITAMIN B-12,) 1000 MCG/ML injection, INJECT 1ML INTO THE MUSCLE EVERY 30 DAYS. MUST HAVE OFFICE VISIT FOR FURTHER REFILLS, Disp: 1 mL, Rfl: 11   lovastatin (MEVACOR) 40 MG tablet, TAKE 1 TABLET BY MOUTH ONCE DAILY AT BEDTIME, Disp: 90 tablet, Rfl: 0   mupirocin ointment (BACTROBAN) 2 %, APPLY DAILY TO ANY SORES OR INJURIES, Disp: 22 g, Rfl: 2   pantoprazole (PROTONIX) 40 MG tablet, Take 1 tablet (40 mg total) by mouth every morning., Disp: 90 tablet, Rfl: 3   triamcinolone (KENALOG) 0.025 % ointment, daily as needed for rash., Disp: , Rfl:    [name] 25000-79000 units CPEP, TAKE 2 CAPSULES BY MOUTH 3 TIMES DAILY WITH MEALS, Disp: 180 capsule, Rfl: 0      Objective:   Vitals:   09/29/20 1451  BP: 124/62  Pulse: 66  SpO2: 99%  Weight: 164 lb (74.4 kg)  Height: 5' 6.5"  (1.689 m)    Estimated body mass index is 26.07 kg/m as calculated from the following:   Height as of this encounter: 5' 6.5" (1.689 m).   Weight as of this encounter: 164 lb (74.4 kg).  @WEIGHTCHANGE @  [employer]   09/29/20 1451  Weight: 164 lb (74.4 kg)     Physical Exam  General: No distress. Look swell Neuro: Alert and Oriented x 3. GCS 15. Speech normal Psych: Pleasant Resp:  Barrel Chest -  no.  Wheeze - no, Crackles - no, No overt respiratory distress CVS: Normal heart sounds. Murmurs - no Ext: Stigmata of Connective Tissue Disease - no HEENT: Normal upper airway. PEERL +. No post nasal drip        Assessment:       ICD-10-CM   1. DOE (dyspnea on exertion)  R06.00     2. Stopped smoking with greater than 40 pack year history  Z87.891     3. History of frequent upper respiratory infection  Z87.09          Plan:     Patient Instructions     ICD-10-CM   1. DOE (dyspnea on exertion)  R06.00     2. Stopped smoking with greater than 40 pack year history  Z87.891     3. History of frequent upper respiratory infection  Z87.09       Likel have mild copd but need to confirm this and also rule out Glad your are not smoking anymore  Do PFT Do HRCT supine and prone Do blood test for alpha 1 AT phenotype  Do above in [place]  [place]  - return to see NP in few to several weeks but after completting aboe    SIGNATURE    Dr. Brand Males, M.D., F.C.C.P,  Pulmonary and Critical Care Medicine Staff Physician, [place] Director - Interstitial Lung Disease  Program  Pulmonary [place] at [place], [place], [address]  Pager: [phone], If no answer or between  15:00h - 7:00h: call [phone] Telephone: [phone]  3:56 PM 09/29/2020

## 2020-10-13 ENCOUNTER — Telehealth: Payer: Self-pay | Admitting: Internal Medicine

## 2020-10-13 DIAGNOSIS — Z8719 Personal history of other diseases of the digestive system: Secondary | ICD-10-CM

## 2020-10-13 DIAGNOSIS — H353211 Exudative age-related macular degeneration, right eye, with active choroidal neovascularization: Secondary | ICD-10-CM | POA: Diagnosis not present

## 2020-10-13 MED ORDER — ZENPEP 25000-79000 UNITS PO CPEP: ORAL_CAPSULE | ORAL | 0 refills | Status: DC

## 2020-10-13 NOTE — Telephone Encounter (Signed)
Rx sent. Office visit needed for further refills.

## 2020-10-13 NOTE — Telephone Encounter (Signed)
Inbound call from patient requesting additional refills for [name] medication to be sent to pharmacy in chart please.

## 2020-10-14 LAB — ALPHA-1 ANTITRYPSIN PHENOTYPE: A-1 Antitrypsin, Ser: 132 mg/dL (ref 83–199)

## 2020-10-19 ENCOUNTER — Other Ambulatory Visit: Payer: PPO

## 2020-10-20 ENCOUNTER — Other Ambulatory Visit: Payer: Self-pay

## 2020-10-20 ENCOUNTER — Encounter: Payer: Self-pay | Admitting: Family Medicine

## 2020-10-20 ENCOUNTER — Ambulatory Visit (INDEPENDENT_AMBULATORY_CARE_PROVIDER_SITE_OTHER): Payer: PPO | Admitting: Family Medicine

## 2020-10-20 ENCOUNTER — Other Ambulatory Visit: Payer: Self-pay | Admitting: Internal Medicine

## 2020-10-20 VITALS — BP 142/70 | HR 62 | Temp 97.8°F | Ht 66.0 in | Wt 163.8 lb

## 2020-10-20 DIAGNOSIS — Z1159 Encounter for screening for other viral diseases: Secondary | ICD-10-CM | POA: Diagnosis not present

## 2020-10-20 DIAGNOSIS — Z0000 Encounter for general adult medical examination without abnormal findings: Secondary | ICD-10-CM

## 2020-10-20 DIAGNOSIS — Z8673 Personal history of transient ischemic attack (TIA), and cerebral infarction without residual deficits: Secondary | ICD-10-CM

## 2020-10-20 DIAGNOSIS — E782 Mixed hyperlipidemia: Secondary | ICD-10-CM | POA: Diagnosis not present

## 2020-10-20 DIAGNOSIS — I10 Essential (primary) hypertension: Secondary | ICD-10-CM

## 2020-10-20 DIAGNOSIS — Z8719 Personal history of other diseases of the digestive system: Secondary | ICD-10-CM

## 2020-10-20 MED ORDER — LOVASTATIN 40 MG PO TABS: ORAL_TABLET | ORAL | 3 refills | Status: DC

## 2020-10-20 NOTE — Progress Notes (Signed)
Subjective:   Cody Willis is a 80 y.o. male who presents for Medicare Annual/Subsequent preventive examination.  Review of Systems    Review of Systems  Constitutional:  Negative for chills and fever.  HENT:  Negative for congestion and sore throat.   Eyes:  Negative for blurred vision and double vision.  Respiratory:  Negative for shortness of breath.   Cardiovascular:  Negative for chest pain.  Gastrointestinal:  Negative for heartburn, nausea and vomiting.  Genitourinary: Negative.   Musculoskeletal: Negative.  Negative for myalgias.  Skin:  Negative for rash.  Neurological:  Negative for dizziness and headaches.  Endo/Heme/Allergies:  Does not bruise/bleed easily.  Psychiatric/Behavioral:  Negative for depression. The patient is not nervous/anxious.    Cardiac Risk Factors include: advanced age (>43men, >45 women);male gender;hypertension     Objective:    Today's Vitals   10/20/20 1441  BP: (!) 142/70  Pulse: 62  Temp: 97.8 F (36.6 C)  TempSrc: Temporal  SpO2: 97%  Weight: 163 lb 12 oz (74.3 kg)  Height: 5\' 6"  (1.676 m)   Body mass index is 26.43 kg/m.  Advanced Directives 10/20/2020 08/19/2020 07/26/2015 02/08/2015 01/16/2015  Does Patient Have a Medical Advance Directive? No No No No No  Would patient like information on creating a medical advance directive? Yes (MAU/Ambulatory/Procedural Areas - Information given) - Yes - Educational materials given - No - patient declined information    Current Medications (verified) Outpatient Encounter Medications as of 10/20/2020  Medication Sig   aspirin EC 325 MG tablet Take 1 tablet (325 mg total) by mouth daily.   cyanocobalamin (,VITAMIN B-12,) 1000 MCG/ML injection INJECT 1ML INTO THE MUSCLE EVERY 30 DAYS. MUST HAVE OFFICE VISIT FOR FURTHER REFILLS   mupirocin ointment (BACTROBAN) 2 % APPLY DAILY TO ANY SORES OR INJURIES   Pancrelipase, Lip-Prot-Amyl, ([name]) 25000-79000 units CPEP TAKE 2 CAPSULES BY MOUTH 3  TIMES DAILY WITH MEALS. MUST HAVE OFFICE VISIT FOR ADDITIONAL REFILLS.   pantoprazole (PROTONIX) 40 MG tablet Take 1 tablet (40 mg total) by mouth every morning.   triamcinolone (KENALOG) 0.025 % ointment daily as needed for rash.   [DISCONTINUED] lovastatin (MEVACOR) 40 MG tablet TAKE 1 TABLET BY MOUTH ONCE DAILY AT BEDTIME   lovastatin (MEVACOR) 40 MG tablet TAKE 1 TABLET BY MOUTH ONCE DAILY AT BEDTIME   No facility-administered encounter medications on file as of 10/20/2020.    Allergies (verified) Plavix [clopidogrel bisulfate] and Sulfamethoxazole-trimethoprim   History: Past Medical History:  Diagnosis Date   Arthritis    fingers   Basal cell carcinoma 02/10/2008   left top of shoulder   Cataract    bilateral surgery repair   Chronic pancreatitis ([place])    Esophageal reflux    Essential hypertension, benign    Gastric polyps    hyperplastic   Hiatal hernia    Other B-complex deficiencies    Pure hypercholesterolemia    Stroke ([place]) 01/16/2015   Pt. started on plavix   Past Surgical History:  Procedure Laterality Date   APPENDECTOMY     BACK SURGERY     Diskectomy between L4 and L5   CATARACT EXTRACTION W/ INTRAOCULAR LENS  IMPLANT, BILATERAL  12/15 and 2/16   CHOLECYSTECTOMY     COLONOSCOPY  2011   MELANOMA EXCISION  ~2009   right upper abdomen   Family History  Problem Relation Age of Onset   Stroke Mother    Diabetes Mother    Emphysema Father  smoker   Crohn's disease Sister    Colitis Brother    Colon cancer Neg Hx    Colon polyps Neg Hx    Esophageal cancer Neg Hx    Rectal cancer Neg Hx    Stomach cancer Neg Hx    Social History   Socioeconomic History   Marital status: Married    Spouse name: [name]   Number of children: 2   Years of education: high school, some college   Highest education level: Not on file  Occupational History   Occupation: Truck driver-- local    Comment: part time  Tobacco Use   Smoking status: Former     Packs/day: 1.00    Years: 50.00    Pack years: 50.00    Types: Cigarettes    Quit date: 01/16/2015    Years since quitting: 5.7   Smokeless tobacco: Never  Vaping Use   Vaping Use: Never used  Substance and Sexual Activity   Alcohol use: No    Alcohol/week: 0.0 standard drinks   Drug use: No   Sexual activity: Not Currently  Other Topics Concern   Not on file  Social History Narrative   No living will   Wife should make decisions for him if needed   Would accept resuscitation attempts   Not sure about tube feeds   Lives [name]   2 children and 2 step children - some children live nearby   Has grandchildren and step grandchildren - 25 total   Enjoys: work   Work - Administrator, has to stay in good shape for that   Social Determinants of Radio broadcast assistant Strain: Not on Art therapist Insecurity: Not on file  Transportation Needs: Not on file  Physical Activity: Not on file  Stress: Not on file  Social Connections: Not on file    Tobacco Counseling Counseling given: Not Answered   Clinical Intake:  Pre-visit preparation completed: No  Pain : No/denies pain     BMI - recorded: 26.43 Nutritional Status: BMI 25 -29 Overweight Nutritional Risks: None Diabetes: No  How often do you need to have someone help you when you read instructions, pamphlets, or other written materials from your doctor or pharmacy?: 1 - Never What is the last grade level you completed in school?: high school  Diabetic?no  Interpreter Needed?: No      Activities of Daily Living In your present state of health, do you have any difficulty performing the following activities: 10/20/2020  Hearing? N  Vision? N  Difficulty concentrating or making decisions? N  Walking or climbing stairs? N  Dressing or bathing? N  Doing errands, shopping? N  Preparing Food and eating ? N  Using the Toilet? N  In the past six months, have you accidently leaked urine? N  Do you have problems with  loss of bowel control? Y  Comment following with GI to help with this  Managing your Medications? N  Managing your Finances? N  Housekeeping or managing your Housekeeping? N  Some recent data might be hidden    Patient Care Team: Lesleigh Noe, MD as PCP - General (Family Medicine)  Indicate any recent Medical Services you may have received from other than [facility] providers in the past year (date may be approximate).     Assessment:   This is a routine wellness examination for Onycha.  Hearing/Vision screen Hearing Screening   250Hz  500Hz  1000Hz  2000Hz  4000Hz   Right ear 20 20  20 20 0  Left ear 20 20 20 20  0  Vision Screening - Comments:: Last eye exam 10/13/20 at [place] issues and exercise activities discussed: Current Exercise Habits: The patient has a physically strenuous job, but has no regular exercise apart from work., Exercise limited by: None identified   Goals Addressed             This Visit's Progress    Exercise 4x per week (30 min per time)        Depression Screen PHQ 2/9 Scores 10/20/2020 08/20/2019 05/29/2018 05/07/2017 07/26/2015 08/17/2014  PHQ - 2 Score 0 0 0 0 0 0  PHQ- 9 Score - - 0 - - -    Fall Risk Fall Risk  10/20/2020 10/20/2020 08/20/2019 05/29/2018 05/07/2017  Falls in the past year? 0 0 0 0 No  Number falls in past yr: 0 0 0 0 -  Injury with Fall? 0 - 0 0 -  Risk for fall due to : No Fall Risks - - - -  Follow up Falls evaluation completed - - - -    FALL RISK PREVENTION PERTAINING TO THE HOME:  Any stairs in or around the home? No  If so, are there any without handrails?  N/a Home free of loose throw rugs in walkways, pet beds, electrical cords, etc? Yes  Adequate lighting in your home to reduce risk of falls? Yes   ASSISTIVE DEVICES UTILIZED TO PREVENT FALLS:  Life alert? No  Use of a cane, walker or w/c? No  Grab bars in the bathroom? No  Shower chair or bench in shower? No  Elevated toilet seat or a  handicapped toilet? No     Cognitive Function:       Mini-Cog - 10/20/20 1502     Normal clock drawing test? yes    How many words correct? 2                Immunizations Immunization History  Administered Date(s) Administered   PFIZER(Purple Top)SARS-COV-2 Vaccination 06/27/2019, 07/18/2019, 02/22/2020   Pneumococcal Conjugate-13 03/09/2014   Pneumococcal Polysaccharide-23 04/30/2015   Td 04/09/2009    TDAP status: Due, Education has been provided regarding the importance of this vaccine. Advised may receive this vaccine at local pharmacy or Health Dept. Aware to provide a copy of the vaccination record if obtained from local pharmacy or Health Dept. Verbalized acceptance and understanding.  Flu Vaccine status: Up to date  Pneumococcal vaccine status: Up to date  Covid-19 vaccine status: Completed vaccines  Qualifies for Shingles Vaccine? Yes   Zostavax completed No   Shingrix Completed?: No.    Education has been provided regarding the importance of this vaccine. Patient has been advised to call insurance company to determine out of pocket expense if they have not yet received this vaccine. Advised may also receive vaccine at local pharmacy or Health Dept. Verbalized acceptance and understanding.  Screening Tests Health Maintenance  Topic Date Due   Hepatitis C Screening  Never done   Zoster Vaccines- Shingrix (1 of 2) Never done   TETANUS/TDAP  04/10/2019   COVID-19 Vaccine (4 - Booster for Pfizer series) 05/24/2020   INFLUENZA VACCINE  11/07/2020   PNA vac Low Risk Adult  Completed   HPV VACCINES  Aged Out    Health Maintenance  Health Maintenance Due  Topic Date Due   Hepatitis C Screening  Never done   Zoster Vaccines- Shingrix (1 of 2) Never done  TETANUS/TDAP  04/10/2019   COVID-19 Vaccine (4 - Booster for Pfizer series) 05/24/2020    Colorectal cancer screening: No longer required.   Lung Cancer Screening: (Low Dose CT Chest recommended if  Age 35-80 years, 30 pack-year currently smoking OR have quit w/in 15years.) does qualify.   Lung Cancer Screening Referral: already placed  Additional Screening:  Hepatitis C Screening: does qualify; Completed today  Vision Screening: Recommended annual ophthalmology exams for early detection of glaucoma and other disorders of the eye. Is the patient up to date with their annual eye exam?  Yes  Who is the provider or what is the name of the office in which the patient attends annual eye exams? Dr. Valetta Close If pt is not established with a provider, would they like to be referred to a provider to establish care?  N/a .   Dental Screening: Recommended annual dental exams for proper oral hygiene  Community Resource Referral / Chronic Care Management: CRR required this visit?  No   CCM required this visit?  No      Plan:     I have personally reviewed and noted the following in the patient's chart:   Medical and social history Use of alcohol, tobacco or illicit drugs  Current medications and supplements including opioid prescriptions. Patient is not currently taking opioid prescriptions. Functional ability and status Nutritional status Physical activity Advanced directives List of other physicians Hospitalizations, surgeries, and ER visits in previous 12 months Vitals Screenings to include cognitive, depression, and falls Referrals and appointments  In addition, I have reviewed and discussed with patient certain preventive protocols, quality metrics, and best practice recommendations. A written personalized care plan for preventive services as well as general preventive health recommendations were provided to patient.     Lesleigh Noe, MD   10/20/2020

## 2020-10-20 NOTE — Patient Instructions (Addendum)
#  Vaccines - get updated Tdap, covid, and shingles vaccines at your pharmacy  Your blood pressure high.   High blood pressure increases your risk for heart attack and stroke.   Please check your blood pressure 2-4 times a week.   To check your blood pressure 1) Sit in a quiet and relaxed place for 5 minutes 2) Make sure your feet are flat on the ground 3) Consider checking first thing in the morning   Normal blood pressure is less than 140/90 Ideally you blood pressure should be around 120/80

## 2020-10-21 ENCOUNTER — Ambulatory Visit (INDEPENDENT_AMBULATORY_CARE_PROVIDER_SITE_OTHER)
Admission: RE | Admit: 2020-10-21 | Discharge: 2020-10-21 | Disposition: A | Discharge status: Home / Self Care | Payer: PPO | Source: Ambulatory Visit | Attending: Internal Medicine | Admitting: Internal Medicine

## 2020-10-21 DIAGNOSIS — J849 Interstitial pulmonary disease, unspecified: Secondary | ICD-10-CM | POA: Diagnosis not present

## 2020-10-21 DIAGNOSIS — J439 Emphysema, unspecified: Secondary | ICD-10-CM | POA: Diagnosis not present

## 2020-10-21 DIAGNOSIS — R918 Other nonspecific abnormal finding of lung field: Secondary | ICD-10-CM | POA: Diagnosis not present

## 2020-10-21 DIAGNOSIS — R0600 Dyspnea, unspecified: Secondary | ICD-10-CM

## 2020-10-21 DIAGNOSIS — R911 Solitary pulmonary nodule: Secondary | ICD-10-CM | POA: Diagnosis not present

## 2020-10-21 DIAGNOSIS — R0609 Other forms of dyspnea: Secondary | ICD-10-CM

## 2020-10-21 DIAGNOSIS — Z87891 Personal history of nicotine dependence: Secondary | ICD-10-CM | POA: Diagnosis not present

## 2020-10-21 DIAGNOSIS — I700 Atherosclerosis of aorta: Secondary | ICD-10-CM | POA: Diagnosis not present

## 2020-10-21 LAB — LIPID PANEL
Cholesterol: 138 mg/dL (ref 0–200)
HDL: 39.3 mg/dL (ref >39.00)
NonHDL: 98.76
Total CHOL/HDL Ratio: 4
Triglycerides: 247 mg/dL — ABNORMAL HIGH (ref 0.0–149.0)
VLDL: 49.4 mg/dL — ABNORMAL HIGH (ref 0.0–40.0)

## 2020-10-21 LAB — LDL CHOLESTEROL, DIRECT: Direct LDL: 76 mg/dL

## 2020-10-21 LAB — COMPREHENSIVE METABOLIC PANEL
ALT: 16 U/L (ref 0–53)
AST: 22 U/L (ref 0–37)
Albumin: 4.5 g/dL (ref 3.5–5.2)
Alkaline Phosphatase: 60 U/L (ref 39–117)
BUN: 18 mg/dL (ref 6–23)
CO2: 31 mEq/L (ref 19–32)
Calcium: 9.4 mg/dL (ref 8.4–10.5)
Chloride: 104 mEq/L (ref 96–112)
Creatinine, Ser: 1.07 mg/dL (ref 0.40–1.50)
GFR: 66.06 mL/min (ref >60.00)
Glucose, Bld: 117 mg/dL — ABNORMAL HIGH (ref 70–99)
Potassium: 4.3 mEq/L (ref 3.5–5.1)
Sodium: 142 mEq/L (ref 135–145)
Total Bilirubin: 0.9 mg/dL (ref 0.2–1.2)
Total Protein: 6.7 g/dL (ref 6.0–8.3)

## 2020-10-21 LAB — HEPATITIS C ANTIBODY
Hepatitis C Ab: NONREACTIVE
SIGNAL TO CUT-OFF: 0.01 (ref <1.00)

## 2020-10-28 ENCOUNTER — Ambulatory Visit (INDEPENDENT_AMBULATORY_CARE_PROVIDER_SITE_OTHER): Payer: PPO | Admitting: Internal Medicine

## 2020-10-28 ENCOUNTER — Other Ambulatory Visit: Payer: Self-pay

## 2020-10-28 ENCOUNTER — Ambulatory Visit: Payer: PPO | Admitting: Primary Care

## 2020-10-28 VITALS — BP 122/62 | HR 64 | Ht 66.0 in | Wt 163.0 lb

## 2020-10-28 DIAGNOSIS — R911 Solitary pulmonary nodule: Secondary | ICD-10-CM | POA: Diagnosis not present

## 2020-10-28 DIAGNOSIS — R0600 Dyspnea, unspecified: Secondary | ICD-10-CM

## 2020-10-28 DIAGNOSIS — I700 Atherosclerosis of aorta: Secondary | ICD-10-CM | POA: Insufficient documentation

## 2020-10-28 DIAGNOSIS — Z87891 Personal history of nicotine dependence: Secondary | ICD-10-CM

## 2020-10-28 DIAGNOSIS — J449 Chronic obstructive pulmonary disease, unspecified: Secondary | ICD-10-CM | POA: Diagnosis not present

## 2020-10-28 DIAGNOSIS — R918 Other nonspecific abnormal finding of lung field: Secondary | ICD-10-CM | POA: Diagnosis not present

## 2020-10-28 DIAGNOSIS — H6123 Impacted cerumen, bilateral: Secondary | ICD-10-CM | POA: Diagnosis not present

## 2020-10-28 DIAGNOSIS — I2510 Atherosclerotic heart disease of native coronary artery without angina pectoris: Secondary | ICD-10-CM | POA: Diagnosis not present

## 2020-10-28 DIAGNOSIS — R0609 Other forms of dyspnea: Secondary | ICD-10-CM

## 2020-10-28 LAB — PULMONARY FUNCTION TEST
DL/VA % pred: 84 %
DL/VA: 3.34 ml/min/mmHg/L
DLCO cor % pred: 104 %
DLCO cor: 22.67 ml/min/mmHg
DLCO unc % pred: 104 %
DLCO unc: 22.67 ml/min/mmHg
FEF 25-75 Post: 0.92 L/sec
FEF 25-75 Pre: 0.53 L/sec
FEF2575-%Change-Post: 74 %
FEF2575-%Pred-Post: 55 %
FEF2575-%Pred-Pre: 31 %
FEV1-%Change-Post: 21 %
FEV1-%Pred-Post: 65 %
FEV1-%Pred-Pre: 53 %
FEV1-Post: 1.58 L
FEV1-Pre: 1.3 L
FEV1FVC-%Change-Post: 8 %
FEV1FVC-%Pred-Pre: 75 %
FEV6-%Change-Post: 14 %
FEV6-%Pred-Post: 82 %
FEV6-%Pred-Pre: 72 %
FEV6-Post: 2.62 L
FEV6-Pre: 2.28 L
FEV6FVC-%Change-Post: 2 %
FEV6FVC-%Pred-Post: 105 %
FEV6FVC-%Pred-Pre: 102 %
FVC-%Change-Post: 11 %
FVC-%Pred-Post: 78 %
FVC-%Pred-Pre: 70 %
FVC-Post: 2.69 L
FVC-Pre: 2.4 L
Post FEV1/FVC ratio: 59 %
Post FEV6/FVC ratio: 97 %
Pre FEV1/FVC ratio: 54 %
Pre FEV6/FVC Ratio: 95 %
RV % pred: 155 %
RV: 3.75 L
TLC % pred: 107 %
TLC: 6.71 L

## 2020-10-28 MED ORDER — SPIRIVA RESPIMAT 2.5 MCG/ACT IN AERS: 2.0000 | INHALATION_SPRAY | Freq: Every day | RESPIRATORY_TRACT | 0 refills | Status: DC

## 2020-10-28 NOTE — Assessment & Plan Note (Signed)
-   CT imaging in July 2022 showed tiny pulmonary nodules 2-61m right lung, statistically benign. Recommend repeating CT chest wo contrast in 12 months as patient is considered high risk d/t past heavy smoking hx

## 2020-10-28 NOTE — Assessment & Plan Note (Addendum)
-   He is fairly asymptomatic, except for reported dyspnea symptoms with heavy exertion - Pulmonary function testing today showed moderate obstructive airway disease with reversibility -HRCT in July 2022 showed no evidence of ILD, mild centrilobular and paraseptal emphysema -Trial Spiriva Respimat 2.5 mcg 1 puff daily x2 weeks -Follow-up telephone visit 2 weeks with Beth NP to assess response to new inhaler regimen

## 2020-10-28 NOTE — Progress Notes (Signed)
PFT done today. 

## 2020-10-28 NOTE — Patient Instructions (Addendum)
CT chest showed no evidence of interstitial lung disease, tiny 2 to 3 mm pulmonary nodules right lung which are nonspecific but likely benign.  Recommended repeating CT chest in 12 months if patient is high risk.  Incidental findings of aortic arthrosclerosis in addition to left main and three-vessel coronary artery disease  Pulmonary function testing today showed evidence of moderate COPD  (see patient education below)  Recommendations: Trial Spiriva 2.91mg - take one puff every day in the morning for 2 weeks (see patient education below)  Referral: Cardiology re: aortic atherosclerosis, three vessel disease   Orders: CT chest wo contrast re: pulmonary nodules <1cm, high risk patient   Follow-up Televisit with Beth NP in 2 weeks to assess response to trial inhaler regimen    Chronic Obstructive Pulmonary Disease  Chronic obstructive pulmonary disease (COPD) is a long-term (chronic) lung problem. When you have COPD, it is hard for air to get in and out ofyour lungs. Usually the condition gets worse over time, and your lungs will never return tonormal. There are things you can do to keep yourself as healthy as possible. What are the causes? Smoking. This is the most common cause. Certain genes passed from parent to child (inherited). What increases the risk? Being exposed to secondhand smoke from cigarettes, pipes, or cigars. Being exposed to chemicals and other irritants, such as fumes and dust in the work environment. Having chronic lung conditions or infections. What are the signs or symptoms? Shortness of breath, especially during physical activity. A long-term cough with a large amount of thick mucus. Sometimes, the cough may not have any mucus (dry cough). Wheezing. Breathing quickly. Skin that looks gray or blue, especially in the fingers, toes, or lips. Feeling tired (fatigue). Weight loss. Chest tightness. Having infections often. Episodes when breathing symptoms become  much worse (exacerbations). At the later stages of this disease, you may have swelling in the ankles, feet,or legs. How is this treated? Taking medicines. Quitting smoking, if you smoke. Rehabilitation. This includes steps to make your body work better. It may involve a team of specialists. Doing exercises. Making changes to your diet. Using oxygen. Lung surgery. Lung transplant. Comfort measures (palliative care). Follow these instructions at home: Medicines Take over-the-counter and prescription medicines only as told by your doctor. Talk to your doctor before taking any cough or allergy medicines. You may need to avoid medicines that cause your lungs to be dry. Lifestyle If you smoke, stop smoking. Smoking makes the problem worse. Do not smoke or use any products that contain nicotine or tobacco. If you need help quitting, ask your doctor. Avoid being around things that make your breathing worse. This may include smoke, chemicals, and fumes. Stay active, but remember to rest as well. Learn and use tips on how to manage stress and control your breathing. Make sure you get enough sleep. Most adults need at least 7 hours of sleep every night. Eat healthy foods. Eat smaller meals more often. Rest before meals. Controlled breathing Learn and use tips on how to control your breathing as told by your doctor. Try: Breathing in (inhaling) through your nose for 1 second. Then, pucker your lips and breath out (exhale) through your lips for 2 seconds. Putting one hand on your belly (abdomen). Breathe in slowly through your nose for 1 second. Your hand on your belly should move out. Pucker your lips and breathe out slowly through your lips. Your hand on your belly should move in as you breathe out.  Controlled coughing Learn and use controlled coughing to clear mucus from your lungs. Follow these steps: Lean your head a little forward. Breathe in deeply. Try to hold your breath for 3  seconds. Keep your mouth slightly open while coughing 2 times. Spit any mucus out into a tissue. Rest and do the steps again 1 or 2 times as needed. General instructions Make sure you get all the shots (vaccines) that your doctor recommends. Ask your doctor about a flu shot and a pneumonia shot. Use oxygen therapy and pulmonary rehabilitation if told by your doctor. If you need home oxygen therapy, ask your doctor if you should buy a tool to measure your oxygen level (oximeter). Make a COPD action plan with your doctor. This helps you to know what to do if you feel worse than usual. Manage any other conditions you have as told by your doctor. Avoid going outside when it is very hot, cold, or humid. Avoid people who have a sickness you can catch (contagious). Keep all follow-up visits. Contact a doctor if: You cough up more mucus than usual. There is a change in the color or thickness of the mucus. It is harder to breathe than usual. Your breathing is faster than usual. You have trouble sleeping. You need to use your medicines more often than usual. You have trouble doing your normal activities such as getting dressed or walking around the house. Get help right away if: You have shortness of breath while resting. You have shortness of breath that stops you from: Being able to talk. Doing normal activities. Your chest hurts for longer than 5 minutes. Your skin color is more blue than usual. Your pulse oximeter shows that you have low oxygen for longer than 5 minutes. You have a fever. You feel too tired to breathe normally. These symptoms may represent a serious problem that is an emergency. Do not wait to see if the symptoms will go away. Get medical help right away. Call your local emergency services (911 in the [place] Do not drive yourself to the hospital. Summary Chronic obstructive pulmonary disease (COPD) is a long-term lung problem. The way your lungs work will never return to  normal. Usually the condition gets worse over time. There are things you can do to keep yourself as healthy as possible. Take over-the-counter and prescription medicines only as told by your doctor. If you smoke, stop. Smoking makes the problem worse. This information is not intended to replace advice given to you by your health care provider. Make sure you discuss any questions you have with your healthcare provider. Document Revised: 02/02/2020 Document Reviewed: 02/02/2020 Elsevier Patient Education  [employer].  Tiotropium respiratory inhalation spray (Spiriva Respimat) What is this medication? TIOTROPIUM (tee oh TRO pee um) is a bronchodilator. It helps open up the airways in your lungs to make it easier to breathe. This medicine is used to treat chronic obstructive pulmonary disease (COPD), including emphysema and chronic bronchitis. It is also used to treat asthma. Do not use this medicinefor an acute attack. This medicine may be used for other purposes; ask your health care provider orpharmacist if you have questions. COMMON BRAND NAME(S): Spiriva Respimat What should I tell my care team before I take this medication? They need to know if you have any of these conditions: bladder problems or difficulty passing urine glaucoma kidney disease prostate disease an unusual or allergic reaction to tiotropium, ipratropium, atropine, other medicines, foods, dyes, or preservatives pregnant or trying to  get pregnant breast-feeding How should I use this medication? This medicine is inhaled through the mouth. It is used once per day. Follow the directions on the prescription label. Take your medicine at regular intervals. Do not take your medicine more often than directed. Do not stop taking except on your doctor's advice. Make sure that you are using your inhaler correctly.Ask you doctor or health care provider if you have any questions. Talk to your pediatrician regarding the use of this  medicine in children. While this drug may be prescribed for children as young as 6 years for selectedconditions, precautions do apply. Overdosage: If you think you have taken too much of this medicine contact apoison control center or emergency room at once. NOTE: This medicine is only for you. Do not share this medicine with others. What if I miss a dose? If you miss a dose, use it as soon as you can. If it is almost time for your next dose, use only that dose and continue with your regular schedule. Do notuse double or extra doses. What may interact with this medication? This medicine may also interact with the following medications: atropine antihistamines for allergy, cough and cold certain medicines for bladder problems like oxybutynin, tolterodine certain medicines for stomach problems like dicyclomine, hyoscyamine certain medicines for travel sickness like scopolamine certain medicines for Parkinson's disease like benztropine, trihexyphenidyl ipratropium This list may not describe all possible interactions. Give your health care provider a list of all the medicines, herbs, non-prescription drugs, or dietary supplements you use. Also tell them if you smoke, drink alcohol, or use illegaldrugs. Some items may interact with your medicine. What should I watch for while using this medication? Visit your doctor or health care professional for regular checks on your progress. Tell your doctor if your symptoms do not improve. Do not use more medicine than directed. If your symptoms get worse while you are using thismedicine, call your doctor right away. Do not get the this medicine in your eyes. It can cause irritation, pain, orblurred vision. You may get dizzy. Do not drive, use machinery, or do anything that needs mental alertness until you know how this medicine affects you. Do not stand or sit up quickly, especially if you are an older patient. This reduces the riskof dizzy or fainting  spells. Clean the inhaler as directed in the patient information sheet that comes withthis medicine. What side effects may I notice from receiving this medication? Side effects that you should report to your doctor or health care professionalas soon as possible: allergic reactions like skin rash or hives, swelling of the face, lips, or tongue breathing problems right after inhaling your medicine changes in vision chest pain difficulty breathing or wheezing that increases or does not go away fast, irregular heartbeat general ill feeling or flu-like symptoms stomach pain trouble passing urine or change in the amount of urine Side effects that usually do not require medical attention (report to yourdoctor or health care professional if they continue or are bothersome): constipation cough dizziness dry mouth sore throat This list may not describe all possible side effects. Call your doctor for medical advice about side effects. You may report side effects to FDA at1-800-FDA-1088. Where should I keep my medication? Keep out of the reach of children. Store at a room temperature between 15 and 30 degrees C (59 and 86 degrees F). Do not freeze. The inhaler should be discarded after 3 months or when the inhaler becomes locked, whichever comes first. Throw  away any unopened packagesafter the expiration date. NOTE: This sheet is a summary. It may not cover all possible information. If you have questions about this medicine, talk to your doctor, pharmacist, orhealth care provider.  2022 Elsevier/Gold Standard (2015-05-31 11:34:37)

## 2020-10-28 NOTE — Assessment & Plan Note (Addendum)
-   Patient has symptoms of exertional dyspnea. He does have moderate COPD, we are trying him on maintenance LAMA inhaler. Refer to cardiology for eval and management of underlying coronary artery disease. Total cholesterol 138, LDL 76 in July 2022.  Continue Lovastatin '40mg'$  daily.

## 2020-10-28 NOTE — Progress Notes (Signed)
$'@Patient'J$  ID: Cody Willis, male    DOB: 1940-05-18, 80 y.o.   MRN: [id number]  Chief Complaint  Patient presents with   Shortness of Breath    PFT today, CT follow up    Referring provider: Lesleigh Noe, MD  HPI: 80 year old male, former smoker quit in 2016 (50-pack-year history).  Past medical history significant for COPD, hypertension, chronic pancreatitis, acid reflux, B12 deficiency, history of CVA, hyperlipidemia, history of melanoma, prediabetes.  Patient of Dr. Chase Caller, last seen in office for consult on 09/29/2020 for dyspnea on exertion.  10/28/2020- Interim hx  Patient presents today for 1 month follow-up with PFTs. He is doing extremely well, no acute complaints. He gets out of breath with heavy exertion. 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 He manages all this quite well. Denies chest tightness, wheezing or cough.    Pulmonary function testing 10/28/2020 FVC 2.69 (78%), FEV1 1.58 (65%), ratio 59.  TLC 107%, DLCOunc 22.67 (104%) Moderate obstructive airway disease with positive bronchodilator response  09/29/20 - Alpha 1 phenotype MM, 132 (wnl)  Imaging 10/21/2020 HRCT-no evidence of interstitial lung disease, diffuse bronchial wall thickening with mild centrilobular and paraseptal emphysema , tiny 2 to 3 mm pulmonary nodules right lung, nonspecific but likely benign.  No follow-up needed if patient is low risk, noncontrast CT chest recommended in 12 months if patient is high risk.  Incidental findings of aortic arthrosclerosis in addition to left main and three-vessel coronary artery disease    Allergies  Allergen Reactions   Plavix [Clopidogrel Bisulfate] Itching   Sulfamethoxazole-Trimethoprim Rash    Immunization History  Administered Date(s) Administered   PFIZER(Purple Top)SARS-COV-2 Vaccination 06/27/2019, 07/18/2019, 02/22/2020   Pneumococcal Conjugate-13 03/09/2014   Pneumococcal Polysaccharide-23 04/30/2015   Td 04/09/2009     Past Medical History:  Diagnosis Date   Arthritis    fingers   Basal cell carcinoma 02/10/2008   left top of shoulder   Cataract    bilateral surgery repair   Chronic pancreatitis ([place])    Esophageal reflux    Essential hypertension, benign    Gastric polyps    hyperplastic   Hiatal hernia    Other B-complex deficiencies    Pure hypercholesterolemia    Stroke ([place]) 01/16/2015   Pt. started on plavix    Tobacco History: Social History   Tobacco Use  Smoking Status Former   Packs/day: 1.00   Years: 50.00   Pack years: 50.00   Types: Cigarettes   Quit date: 01/16/2015   Years since quitting: 5.7  Smokeless Tobacco Never   Counseling given: Not Answered   Outpatient Medications Prior to Visit  Medication Sig Dispense Refill   aspirin EC 325 MG tablet Take 1 tablet (325 mg total) by mouth daily. 90 tablet 3   cyanocobalamin (,VITAMIN B-12,) 1000 MCG/ML injection INJECT 1ML INTO THE MUSCLE EVERY 30 DAYS. MUST HAVE OFFICE VISIT FOR FURTHER REFILLS 1 mL 11   lovastatin (MEVACOR) 40 MG tablet TAKE 1 TABLET BY MOUTH ONCE DAILY AT BEDTIME 90 tablet 3   mupirocin ointment (BACTROBAN) 2 % APPLY DAILY TO ANY SORES OR INJURIES 22 g 2   Pancrelipase, Lip-Prot-Amyl, ([name]) 25000-79000 units CPEP TAKE 2 CAPSULES BY MOUTH 3 TIMES DAILY WITH MEALS. MUST HAVE OFFICE VISIT FOR ADDITIONAL REFILLS. 180 capsule 0   pantoprazole (PROTONIX) 40 MG tablet TAKE ONE TABLET BY MOUTH EVERY MORNING 90 tablet 0   triamcinolone (KENALOG) 0.025 % ointment daily as needed for rash.  No facility-administered medications prior to visit.      Review of Systems  Review of Systems  Constitutional: Negative.   HENT: Negative.    Respiratory:  Negative for cough, shortness of breath and wheezing.   Cardiovascular: Negative.     Physical Exam  BP 122/62   Pulse 64   Ht '5\' 6"'$  (1.676 m)   Wt 163 lb (73.9 kg)   SpO2 97%   BMI 26.31 kg/m  Physical Exam Constitutional:      Appearance:  He is well-developed.  HENT:     Head: Normocephalic and atraumatic.     Mouth/Throat:     Mouth: Mucous membranes are moist.     Pharynx: Oropharynx is clear.  Cardiovascular:     Rate and Rhythm: Normal rate and regular rhythm.  Pulmonary:     Effort: Pulmonary effort is normal.     Breath sounds: No wheezing, rhonchi or rales.  Musculoskeletal:        General: Normal range of motion.  Skin:    General: Skin is warm and dry.  Neurological:     General: No focal deficit present.     Mental Status: He is alert and oriented to person, place, and time. Mental status is at baseline.  Psychiatric:        Mood and Affect: Mood normal.        Behavior: Behavior normal.        Thought Content: Thought content normal.        Judgment: Judgment normal.     Lab Results:  CBC    Component Value Date/Time   WBC 19.3 (H) 08/24/2020 1226   RBC 4.34 08/24/2020 1226   HGB 13.5 08/24/2020 1226   HGB 13.7 07/28/2012 1858   HCT 41.1 08/24/2020 1226   HCT 40.2 07/28/2012 1858   PLT 196 08/24/2020 1226   PLT 243 07/28/2012 1858   MCV 94.7 08/24/2020 1226   MCV 90 07/28/2012 1858   MCH 31.1 08/24/2020 1226   MCHC 32.8 08/24/2020 1226   RDW 14.6 08/24/2020 1226   RDW 14.2 07/28/2012 1858   LYMPHSABS 2.0 05/29/2018 1028   MONOABS 1.1 (H) 05/29/2018 1028   EOSABS 0.2 05/29/2018 1028   BASOSABS 0.1 05/29/2018 1028    BMET    Component Value Date/Time   NA 142 10/20/2020 1515   NA 139 07/28/2012 1858   K 4.3 10/20/2020 1515   K 3.7 07/28/2012 1858   CL 104 10/20/2020 1515   CL 107 07/28/2012 1858   CO2 31 10/20/2020 1515   CO2 27 07/28/2012 1858   GLUCOSE 117 (H) 10/20/2020 1515   GLUCOSE 114 (H) 07/28/2012 1858   BUN 18 10/20/2020 1515   BUN 16 07/28/2012 1858   CREATININE 1.07 10/20/2020 1515   CREATININE 1.27 07/28/2012 1858   CALCIUM 9.4 10/20/2020 1515   CALCIUM 8.9 07/28/2012 1858   GFRNONAA >60 08/24/2020 1226   GFRNONAA 56 (L) 07/28/2012 1858   GFRAA >60 02/08/2015  1128   GFRAA >60 07/28/2012 1858    BNP    Component Value Date/Time   BNP 227 (H) 07/28/2012 1858    ProBNP No results found for: PROBNP  Imaging: CT Chest High Resolution  Result Date: 10/21/2020 CLINICAL DATA:  80 year old male former smoker (quit in 2016) with dyspnea on exertion. Evaluate for interstitial lung disease. EXAM: CT CHEST WITHOUT CONTRAST TECHNIQUE: Multidetector CT imaging of the chest was performed following the standard protocol without intravenous contrast. High resolution imaging  of the lungs, as well as inspiratory and expiratory imaging, was performed. COMPARISON:  No priors. FINDINGS: Cardiovascular: Heart size is normal. There is no significant pericardial fluid, thickening or pericardial calcification. There is aortic atherosclerosis, as well as atherosclerosis of the great vessels of the mediastinum and the coronary arteries, including calcified atherosclerotic plaque in the left main, left anterior descending, left circumflex and right coronary arteries. Mediastinum/Nodes: No pathologically enlarged mediastinal or hilar lymph nodes. Please note that accurate exclusion of hilar adenopathy is limited on noncontrast CT scans. Esophagus is unremarkable in appearance. No axillary lymphadenopathy. Lungs/Pleura: High-resolution images demonstrate no significant regions of ground-glass attenuation, septal thickening,, subpleural reticulation, traction bronchiectasis or frank honeycombing to indicate interstitial lung disease. Inspiratory and expiratory imaging is unremarkable. No acute consolidative airspace disease. No pleural effusions. Diffuse bronchial wall thickening with mild centrilobular and paraseptal emphysema. 2 mm right upper lobe pulmonary nodule (axial image 34 of series 3) and 3 mm right lower lobe pulmonary nodule (axial image 81 of series 3). No other larger more suspicious appearing pulmonary nodules or masses are noted. Upper Abdomen: Aortic atherosclerosis.  Status post cholecystectomy. Calcified granuloma in the right lower lobe. Musculoskeletal: There are no aggressive appearing lytic or blastic lesions noted in the visualized portions of the skeleton. IMPRESSION: 1. No findings to suggest interstitial lung disease. 2. Tiny 2-3 mm pulmonary nodules in the right lung, as above, nonspecific, but statistically likely benign. No follow-up needed if patient is low-risk (and has no known or suspected primary neoplasm). Non-contrast chest CT can be considered in 12 months if patient is high-risk. This recommendation follows the consensus statement: Guidelines for Management of Incidental Pulmonary Nodules Detected on CT Images: From the Fleischner Society 2017; Radiology 2017; 284:228-243. 3. Aortic atherosclerosis, in addition to left main and 3 vessel coronary artery disease. Assessment for potential risk factor modification, dietary therapy or pharmacologic therapy may be warranted, if clinically indicated. Aortic Atherosclerosis (ICD10-I70.0). Electronically Signed   By: Vinnie Langton M.D.   On: 10/21/2020 12:26     Assessment & Plan:   COPD (chronic obstructive pulmonary disease) - He is fairly asymptomatic, except for reported dyspnea symptoms with heavy exertion - Pulmonary function testing today showed moderate obstructive airway disease with reversibility -HRCT in July 2022 showed no evidence of ILD, mild centrilobular and paraseptal emphysema -Trial Spiriva Respimat 2.5 mcg 1 puff daily x2 weeks -Follow-up telephone visit 2 weeks with Eustaquio Maize NP to assess response to new inhaler regimen  Pulmonary nodules - CT imaging in July 2022 showed tiny pulmonary nodules 2-77m right lung, statistically benign. Recommend repeating CT chest wo contrast in 12 months as patient is considered high risk d/t past heavy smoking hx   Aortic atherosclerosis ([place] - Patient has symptoms of exertional dyspnea. He does have moderate COPD, we are trying him on maintenance  LAMA inhaler. Refer to cardiology for eval and management of underlying coronary artery disease. Total cholesterol 138, LDL 76 in July 2022.  Continue Lovastatin '40mg'$  daily.   EMartyn Ehrich NP 10/28/2020

## 2020-11-01 MED ORDER — PANTOPRAZOLE SODIUM 40 MG PO TBEC: 40.0000 mg | DELAYED_RELEASE_TABLET | Freq: Every morning | ORAL | 0 refills | Status: DC

## 2020-11-01 NOTE — Addendum Note (Signed)
Addended by: Larina Bras on: 11/01/2020 09:59 AM   Modules accepted: Orders

## 2020-11-01 NOTE — Telephone Encounter (Signed)
Rx sent 

## 2020-11-01 NOTE — Telephone Encounter (Signed)
Inbound call from patient requesting refill for 3 month protonix to [facility]. Appt scheduled 10/27

## 2020-11-07 ENCOUNTER — Encounter: Payer: Self-pay | Admitting: *Deleted

## 2020-11-17 ENCOUNTER — Other Ambulatory Visit: Payer: Self-pay

## 2020-11-17 ENCOUNTER — Encounter: Payer: Self-pay | Admitting: Primary Care

## 2020-11-17 ENCOUNTER — Telehealth (INDEPENDENT_AMBULATORY_CARE_PROVIDER_SITE_OTHER): Payer: PPO | Admitting: Primary Care

## 2020-11-17 ENCOUNTER — Telehealth: Payer: Self-pay

## 2020-11-17 DIAGNOSIS — I2510 Atherosclerotic heart disease of native coronary artery without angina pectoris: Secondary | ICD-10-CM | POA: Diagnosis not present

## 2020-11-17 DIAGNOSIS — R0609 Other forms of dyspnea: Secondary | ICD-10-CM

## 2020-11-17 DIAGNOSIS — J449 Chronic obstructive pulmonary disease, unspecified: Secondary | ICD-10-CM | POA: Diagnosis not present

## 2020-11-17 DIAGNOSIS — R0600 Dyspnea, unspecified: Secondary | ICD-10-CM | POA: Diagnosis not present

## 2020-11-17 MED ORDER — ALBUTEROL SULFATE HFA 108 (90 BASE) MCG/ACT IN AERS: 2.0000 | INHALATION_SPRAY | Freq: Four times a day (QID) | RESPIRATORY_TRACT | 3 refills | Status: DC | PRN

## 2020-11-17 NOTE — Patient Instructions (Signed)
Stop Spiriva Use albuterol 2 puffs every 6 hours as needed for shortness of breath/wheezing Please call cardiology for evaluation of coronary artery disease  Follow-up with our office in 6 months or sooner if needed

## 2020-11-17 NOTE — Telephone Encounter (Signed)
Lvm for patient in regards to todays appt at 12pm with BW. Will try again later.

## 2020-11-17 NOTE — Progress Notes (Signed)
Virtual Visit via Telephone Note  I connected with [place] on 11/17/20 at 12:00 PM EDT by telephone and verified that I am speaking with the correct person using two identifiers.  Location: Patient: Home Provider: Office    I discussed the limitations, risks, security and privacy concerns of performing an evaluation and management service by telephone and the availability of in person appointments. I also discussed with the patient that there may be a patient responsible charge related to this service. The patient expressed understanding and agreed to proceed.  HPI: 80 year old male, former smoker quit in 2016 (50-pack-year history).  Past medical history significant for COPD, hypertension, chronic pancreatitis, acid reflux, B12 deficiency, history of CVA, hyperlipidemia, history of melanoma, prediabetes.  Patient of Dr. Chase Caller, last seen in office for consult on 09/29/2020 for dyspnea on exertion.  Previous [facility] pulmonary encounter: 10/28/2020- NP, Volanda Napoleon  Patient presents today for 1 month follow-up with PFTs. He is doing extremely well, no acute complaints. He gets out of breath with heavy exertion. 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 He manages all this quite well. Denies chest tightness, wheezing or cough.   11/17/2020- Interim hx  Patient contacted today for 2 week follow-up. During last visit we started him on Spiriva Respimat for moderate COPD/mild emphysema. He used Spiriva for 1 week but did not see a significant difference. His breathing does not bother him with his daily work. He gets winded when cutting wood. He takes breaks when his back gets to hurting him. He has not followed up with cardiology, wanted him to see then for evaluation of 3 vessel coronary artery disease and dyspnea on exertion. Denies chest tightness, wheezing or cough.     Observations/Objective:  Able to speak in full sentences without overt shortness of breath or wheezing    Pulmonary function testing 10/28/2020 FVC 2.69 (78%), FEV1 1.58 (65%), ratio 59.  TLC 107%, DLCOunc 22.67 (104%) Moderate obstructive airway disease with positive bronchodilator response  09/29/20 - Alpha 1 phenotype MM, 132 (wnl)  Imaging 10/21/2020 HRCT-no evidence of interstitial lung disease, diffuse bronchial wall thickening with mild centrilobular and paraseptal emphysema , tiny 2 to 3 mm pulmonary nodules right lung, nonspecific but likely benign.  No follow-up needed if patient is low risk, noncontrast CT chest recommended in 12 months if patient is high risk.  Incidental findings of aortic arthrosclerosis in addition to left main and three-vessel coronary artery disease  Assessment and Plan:  Moderate COPD with mild emphysema: - Patient is mostly asymptomatic except for DOE with heavy exertion. He saw no improvement from trial of Spiriva respimat 2.84mg daily. We will not continue this as there was no benefit. He denies chest tightness, wheezing or cough. Sent in RX for Albuterol hfa to have on hand for breakthrough symptoms. If breathing worsens may consider trial of LABA/LAMA.   Coronary artery disease: - CT imaging showed aortic atherosclerosis in addition to left main and 3 vessel coronary artery disease. He has dyspnea with moderate-heavy exertion not felt to be related to his COPD. Recommending cardiology evaluation, they attempted to contact him but were unsuccessful. I have given him heartcare's number.   Pulmonary nodules: - Former smoker. HRCT 10/21/20 showed tiny 2 to 3 mm pulmonary nodules right lung, nonspecific but likely benign - Needs repeat CT chest wo contrast in 12 months (ordered- due July 2023)   Follow Up Instructions:   - 6 months with Dr. RChase Calleror sooner if needed  I discussed the assessment and treatment plan with the patient. The patient was provided an opportunity to ask questions and all were answered. The patient agreed with the plan and  demonstrated an understanding of the instructions.   The patient was advised to call back or seek an in-person evaluation if the symptoms worsen or if the condition fails to improve as anticipated.  I provided 30 minutes of non-face-to-face time during this encounter.   Martyn Ehrich, NP

## 2020-12-15 ENCOUNTER — Ambulatory Visit: Payer: PPO | Admitting: Dermatology

## 2020-12-15 ENCOUNTER — Other Ambulatory Visit: Payer: Self-pay

## 2020-12-15 DIAGNOSIS — D692 Other nonthrombocytopenic purpura: Secondary | ICD-10-CM

## 2020-12-15 DIAGNOSIS — L219 Seborrheic dermatitis, unspecified: Secondary | ICD-10-CM | POA: Diagnosis not present

## 2020-12-15 DIAGNOSIS — L700 Acne vulgaris: Secondary | ICD-10-CM

## 2020-12-15 DIAGNOSIS — L820 Inflamed seborrheic keratosis: Secondary | ICD-10-CM

## 2020-12-15 DIAGNOSIS — L570 Actinic keratosis: Secondary | ICD-10-CM | POA: Diagnosis not present

## 2020-12-15 DIAGNOSIS — L578 Other skin changes due to chronic exposure to nonionizing radiation: Secondary | ICD-10-CM | POA: Diagnosis not present

## 2020-12-15 MED ORDER — KETOCONAZOLE 2 % EX SHAM: MEDICATED_SHAMPOO | CUTANEOUS | 6 refills | Status: DC

## 2020-12-15 NOTE — Patient Instructions (Addendum)

## 2020-12-15 NOTE — Progress Notes (Signed)
Follow-Up Visit   Subjective  Cody Willis is a 80 y.o. male who presents for the following: Actinic Keratosis (6 months f/u hx of Aks ). Pt c/o itchy irritated scaly scalp. Check several places on the face growing and changing color.   The following portions of the chart were reviewed this encounter and updated as appropriate:   Tobacco  Allergies  Meds  Problems  Med Hx  Surg Hx  Fam Hx     Review of Systems:  No other skin or systemic complaints except as noted in HPI or Assessment and Plan.  Objective  Well appearing patient in no apparent distress; mood and affect are within normal limits.  A focused examination was performed including face. Relevant physical exam findings are noted in the Assessment and Plan.  right cheek White head   Head - Anterior (Face) (10) Erythematous thin papules/macules with gritty scale.   right mandible, right forehead  (2) (2) Erythematous keratotic or waxy stuck-on papule or plaque.   Scalp Pink patches with greasy scale.    Assessment & Plan  [place] right cheek persistent Start sample of [place] apply to face once a day   AK (actinic keratosis) (10) Head - Anterior (Face)  Destruction of lesion - Head - Anterior (Face) Complexity: simple   Destruction method: cryotherapy   Informed consent: discussed and consent obtained   Timeout:  patient name, date of birth, surgical site, and procedure verified Lesion destroyed using liquid nitrogen: Yes   Region frozen until ice ball extended beyond lesion: Yes   Outcome: patient tolerated procedure well with no complications   Post-procedure details: wound care instructions given    Inflamed seborrheic keratosis right mandible, right forehead  (2)  Destruction of lesion - right mandible, right forehead  (2) Complexity: simple   Destruction method: cryotherapy   Informed consent: discussed and consent obtained   Timeout:  patient name, date of birth, surgical site, and  procedure verified Lesion destroyed using liquid nitrogen: Yes   Region frozen until ice ball extended beyond lesion: Yes   Outcome: patient tolerated procedure well with no complications   Post-procedure details: wound care instructions given    Seborrheic dermatitis Scalp  Seborrheic Dermatitis with pruritis  -  is a chronic persistent rash characterized by pinkness and scaling most commonly of the mid face but also can occur on the scalp (dandruff), ears; mid chest, mid back and groin.  It tends to be exacerbated by stress and cooler weather.  People who have neurologic disease may experience new onset or exacerbation of existing seborrheic dermatitis.  The condition is not curable but treatable and can be controlled.   Start Ketoconazole 2% shampoo apply two-three times per week, massage into scalp and leave in for 10 minutes before rinsing out  Start otc dandruff shampoo apply two- three times per week, massage into scalp and leave in for 10 minutes before rinsing out   Related Medications ketoconazole (NIZORAL) 2 % shampoo apply three times per week, massage into scalp and leave in for 10 minutes before rinsing out  Actinic Damage - chronic, secondary to cumulative UV radiation exposure/sun exposure over time - diffuse scaly erythematous macules with underlying dyspigmentation - Recommend daily broad spectrum sunscreen SPF 30+ to sun-exposed areas, reapply every 2 hours as needed.  - Recommend staying in the shade or wearing long sleeves, sun glasses (UVA+UVB protection) and wide brim hats (4-inch brim around the entire circumference of the hat). - Call for new  or changing lesions.   Purpura - Chronic; persistent and recurrent.  Treatable, but not curable. - Violaceous macules and patches - Benign - Related to trauma, age, sun damage and/or use of blood thinners, chronic use of topical and/or oral steroids - Observe - Can use OTC arnica containing moisturizer such as Dermend  Bruise Formula if desired - Call for worsening or other concerns   Return in about 6 months (around 06/14/2021) for TBSE, hx of melanoma.  IMarye Round, CMA, am acting as scribe for Sarina Ser, MD .  Documentation: I have reviewed the above documentation for accuracy and completeness, and I agree with the above.  Sarina Ser, MD

## 2020-12-19 ENCOUNTER — Encounter: Payer: Self-pay | Admitting: Dermatology

## 2021-01-05 DIAGNOSIS — H353211 Exudative age-related macular degeneration, right eye, with active choroidal neovascularization: Secondary | ICD-10-CM | POA: Diagnosis not present

## 2021-02-02 ENCOUNTER — Encounter: Payer: Self-pay | Admitting: Internal Medicine

## 2021-02-02 ENCOUNTER — Ambulatory Visit: Payer: PPO | Admitting: Cardiology

## 2021-02-02 ENCOUNTER — Other Ambulatory Visit: Payer: Self-pay

## 2021-02-02 ENCOUNTER — Ambulatory Visit (INDEPENDENT_AMBULATORY_CARE_PROVIDER_SITE_OTHER): Payer: PPO | Admitting: Internal Medicine

## 2021-02-02 VITALS — BP 140/70 | HR 60 | Ht 66.0 in | Wt 164.0 lb

## 2021-02-02 VITALS — BP 132/60 | HR 70 | Ht 66.0 in | Wt 164.0 lb

## 2021-02-02 DIAGNOSIS — R079 Chest pain, unspecified: Secondary | ICD-10-CM | POA: Diagnosis not present

## 2021-02-02 DIAGNOSIS — Z8673 Personal history of transient ischemic attack (TIA), and cerebral infarction without residual deficits: Secondary | ICD-10-CM

## 2021-02-02 DIAGNOSIS — K861 Other chronic pancreatitis: Secondary | ICD-10-CM

## 2021-02-02 DIAGNOSIS — I700 Atherosclerosis of aorta: Secondary | ICD-10-CM

## 2021-02-02 DIAGNOSIS — Z01812 Encounter for preprocedural laboratory examination: Secondary | ICD-10-CM | POA: Diagnosis not present

## 2021-02-02 DIAGNOSIS — R0989 Other specified symptoms and signs involving the circulatory and respiratory systems: Secondary | ICD-10-CM | POA: Insufficient documentation

## 2021-02-02 DIAGNOSIS — E538 Deficiency of other specified B group vitamins: Secondary | ICD-10-CM | POA: Diagnosis not present

## 2021-02-02 DIAGNOSIS — R072 Precordial pain: Secondary | ICD-10-CM

## 2021-02-02 DIAGNOSIS — Z8719 Personal history of other diseases of the digestive system: Secondary | ICD-10-CM | POA: Diagnosis not present

## 2021-02-02 DIAGNOSIS — Z79899 Other long term (current) drug therapy: Secondary | ICD-10-CM | POA: Diagnosis not present

## 2021-02-02 DIAGNOSIS — I2510 Atherosclerotic heart disease of native coronary artery without angina pectoris: Secondary | ICD-10-CM | POA: Diagnosis not present

## 2021-02-02 DIAGNOSIS — R0602 Shortness of breath: Secondary | ICD-10-CM | POA: Diagnosis not present

## 2021-02-02 DIAGNOSIS — R0600 Dyspnea, unspecified: Secondary | ICD-10-CM | POA: Insufficient documentation

## 2021-02-02 DIAGNOSIS — E782 Mixed hyperlipidemia: Secondary | ICD-10-CM | POA: Diagnosis not present

## 2021-02-02 DIAGNOSIS — I10 Essential (primary) hypertension: Secondary | ICD-10-CM | POA: Diagnosis not present

## 2021-02-02 DIAGNOSIS — K219 Gastro-esophageal reflux disease without esophagitis: Secondary | ICD-10-CM | POA: Diagnosis not present

## 2021-02-02 LAB — BASIC METABOLIC PANEL
BUN/Creatinine Ratio: 19 (ref 10–24)
BUN: 19 mg/dL (ref 8–27)
CO2: 26 mmol/L (ref 20–29)
Calcium: 9.8 mg/dL (ref 8.6–10.2)
Chloride: 102 mmol/L (ref 96–106)
Creatinine, Ser: 1.01 mg/dL (ref 0.76–1.27)
Glucose: 77 mg/dL (ref 70–99)
Potassium: 4.4 mmol/L (ref 3.5–5.2)
Sodium: 142 mmol/L (ref 134–144)
eGFR: 76 mL/min/{1.73_m2} (ref >59)

## 2021-02-02 MED ORDER — PANTOPRAZOLE SODIUM 40 MG PO TBEC: 40.0000 mg | DELAYED_RELEASE_TABLET | Freq: Every morning | ORAL | 3 refills | Status: DC

## 2021-02-02 MED ORDER — CYANOCOBALAMIN 1000 MCG/ML IJ SOLN: INTRAMUSCULAR | 3 refills | Status: DC

## 2021-02-02 MED ORDER — ZENPEP 25000-79000 UNITS PO CPEP: ORAL_CAPSULE | ORAL | 0 refills | Status: DC

## 2021-02-02 MED ORDER — ROSUVASTATIN CALCIUM 20 MG PO TABS: 20.0000 mg | ORAL_TABLET | Freq: Every day | ORAL | 3 refills | Status: DC

## 2021-02-02 MED ORDER — ASPIRIN EC 81 MG PO TBEC: 81.0000 mg | DELAYED_RELEASE_TABLET | Freq: Every day | ORAL | 3 refills | Status: DC

## 2021-02-02 MED ORDER — METOPROLOL TARTRATE 50 MG PO TABS: 50.0000 mg | ORAL_TABLET | ORAL | 0 refills | Status: DC

## 2021-02-02 NOTE — Assessment & Plan Note (Signed)
Change lovastatin 40 mg over to Crestor 20 mg.  High intensity statin.  LDL goal less than 70.

## 2021-02-02 NOTE — Assessment & Plan Note (Signed)
Previously his antihypertensive was stopped.  His blood pressures have been in the 524 systolic at times.  Keep an eye on this.  We may need to reinitiate.

## 2021-02-02 NOTE — Assessment & Plan Note (Signed)
Statin, aspirin, blood pressure control

## 2021-02-02 NOTE — Assessment & Plan Note (Signed)
Agree with low-dose aspirin 81 mg.  Intensify statin regimen.  LDL goal less than 70.  At last check LDL was 76.

## 2021-02-02 NOTE — Assessment & Plan Note (Signed)
We will check echocardiogram.  Has been several years since this was last evaluated.

## 2021-02-02 NOTE — Patient Instructions (Signed)
We have sent the following medications to your pharmacy for you to pick up at your convenience: [name] B12 Pantoprazole  If you are age 79 or older, your body mass index should be between 23-30. Your Body mass index is 26.47 kg/m. If this is out of the aforementioned range listed, please consider follow up with your Primary Care Provider.  If you are age 31 or younger, your body mass index should be between 19-25. Your Body mass index is 26.47 kg/m. If this is out of the aformentioned range listed, please consider follow up with your Primary Care Provider.   ________________________________________________________  The [place] [facility] providers would like to encourage you to use [facility] to communicate with providers for non-urgent requests or questions.  Due to long hold times on the telephone, sending your provider a message by [facility] may be a faster and more efficient way to get a response.  Please allow 48 business hours for a response.  Please remember that this is for non-urgent requests.  _______________________________________________________ Due to recent changes in healthcare laws, you may see the results of your imaging and laboratory studies on MyChart before your provider has had a chance to review them.  We understand that in some cases there may be results that are confusing or concerning to you. Not all laboratory results come back in the same time frame and the provider may be waiting for multiple results in order to interpret others.  Please give [place] 48 hours in order for your provider to thoroughly review all the results before contacting the office for clarification of your results.

## 2021-02-02 NOTE — Patient Instructions (Signed)
Medication Instructions:  Please decrease Aspirin to 81 mg a day. Discontinue your Mevacor. Start Crestor 20 mg a day. Continue all other medications as listed.  *If you need a refill on your cardiac medications before your next appointment, please call your pharmacy*  Lab Work: Please have blood work today (BMP) And in 3 months (Lipid/ALT)  If you have labs (blood work) drawn today and your tests are completely normal, you will receive your results only by: [place] (if you have MyChart) OR A paper copy in the mail If you have any lab test that is abnormal or we need to change your treatment, we will call you to review the results.   Testing/Procedures: Your physician has requested that you have an echocardiogram. Echocardiography is a painless test that uses sound waves to create images of your heart. It provides your doctor with information about the size and shape of your heart and how well your heart's chambers and valves are working. This procedure takes approximately one hour. There are no restrictions for this procedure.  Your physician has requested that you have a carotid duplex. This test is an ultrasound of the carotid arteries in your neck. It looks at blood flow through these arteries that supply the brain with blood. Allow one hour for this exam. There are no restrictions or special instructions.    Your cardiac CT will be scheduled at one of the below locations:   [facility] [address] [place], [place] [address] [phone]  Please arrive at the [facility] main entrance (entrance A) of [facility] 30 minutes prior to test start time. You can use the FREE valet parking offered at the main entrance (encouraged to control the heart rate for the test) Proceed to the [facility] Radiology Department (first floor) to check-in and test prep.  Please follow these instructions carefully (unless otherwise directed):  Hold all erectile  dysfunction medications at least 3 days (72 hrs) prior to test.  On the Night Before the Test: Be sure to Drink plenty of water. Do not consume any caffeinated/decaffeinated beverages or chocolate 12 hours prior to your test. Do not take any antihistamines 12 hours prior to your test.  On the Day of the Test: Drink plenty of water until 1 hour prior to the test. Do not eat any food 4 hours prior to the test. You may take your regular medications prior to the test.  Take metoprolol (Lopressor) two hours prior to test. HOLD Furosemide/Hydrochlorothiazide morning of the test.  After the Test: Drink plenty of water. After receiving IV contrast, you may experience a mild flushed feeling. This is normal. On occasion, you may experience a mild rash up to 24 hours after the test. This is not dangerous. If this occurs, you can take Benadryl 25 mg and increase your fluid intake. If you experience trouble breathing, this can be serious. If it is severe call 911 IMMEDIATELY. If it is mild, please call our office. If you take any of these medications: Glipizide/Metformin, Avandament, Glucavance, please do not take 48 hours after completing test unless otherwise instructed.  Please allow 2-4 weeks for scheduling of routine cardiac CTs. Some insurance companies require a pre-authorization which may delay scheduling of this test.   For non-scheduling related questions, please contact the cardiac imaging nurse navigator should you have any questions/concerns: Marchia Bond, Cardiac Imaging Nurse Navigator Gordy Clement, Cardiac Imaging Nurse Navigator  Heart and Vascular Services Direct Office Dial: [phone]  For scheduling needs, including cancellations and rescheduling, please call [place], [phone].  Follow-Up: At [facility], you and your health needs are our priority.  As part of our continuing mission to provide you with exceptional heart care, we have created designated  Provider Care Teams.  These Care Teams include your primary Cardiologist (physician) and Advanced Practice Providers (APPs -  Physician Assistants and Nurse Practitioners) who all work together to provide you with the care you need, when you need it.  We recommend signing up for the patient portal called "MyChart".  Sign up information is provided on this After Visit Summary.  MyChart is used to connect with patients for Virtual Visits (Telemedicine).  Patients are able to view lab/test results, encounter notes, upcoming appointments, etc.  Non-urgent messages can be sent to your provider as well.   To learn more about what you can do with MyChart, go to [email or web address].    Your next appointment:   6 month(s)  The format for your next appointment:   In Person  Provider:   Candee Furbish, MD   Thank you for choosing [facility]!!

## 2021-02-02 NOTE — Progress Notes (Signed)
Subjective:    Patient ID: Cody Willis, male    DOB: Jun 21, 1940, 80 y.o.   MRN: [id number]  HPI Cody Willis is a 80 yo male with PMH of chronic idiopathic pancreatitis, gastritis and hyperplastic gastric polyp, GERD, B12 def, HTN, HL, prior CVA who is here for follow-up.  He is here alone today.  He was last seen in the time of his last colonoscopy.  He reports he is doing well.  He has found [name] to be helpful for loose stools though due to the expense of the medication he is only been using 1 twice daily.  It is written for 2 capsules 3 times daily.  He is using the loperamide 1 to 2 mg once a day to help control loose stools and this works well.  He has no abdominal pain.  His reflux is well controlled with pantoprazole daily.  No dysphagia.  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  He is eating well and eats 3 meals daily.  No weight loss.  No blood in stool or melena.  He was diagnosed with COPD but quit smoking in 2016.  He denies recent chest pain or dyspnea.   Review of Systems As per HPI, otherwise negative  Current Medications, Allergies, Past Medical History, Past Surgical History, Family History and Social History were reviewed in [employer] record.    Objective:   Physical Exam BP 132/60   Pulse 70   Ht 5\' 6"  (1.676 m)   Wt 164 lb (74.4 kg)   BMI 26.47 kg/m  Gen: awake, alert, NAD HEENT: anicteric  CV: RRR, no mrg Pulm: CTA b/l Abd: soft, NT/ND, +BS throughout Ext: no c/c/e Neuro: nonfocal  CBC    Component Value Date/Time   WBC 19.3 (H) 08/24/2020 1226   RBC 4.34 08/24/2020 1226   HGB 13.5 08/24/2020 1226   HGB 13.7 07/28/2012 1858   HCT 41.1 08/24/2020 1226   HCT 40.2 07/28/2012 1858   PLT 196 08/24/2020 1226   PLT 243 07/28/2012 1858   MCV 94.7 08/24/2020 1226   MCV 90 07/28/2012 1858   MCH 31.1 08/24/2020 1226   MCHC 32.8 08/24/2020 1226   RDW 14.6 08/24/2020 1226   RDW 14.2  07/28/2012 1858   LYMPHSABS 2.0 05/29/2018 1028   MONOABS 1.1 (H) 05/29/2018 1028   EOSABS 0.2 05/29/2018 1028   BASOSABS 0.1 05/29/2018 1028   CMP     Component Value Date/Time   NA 142 10/20/2020 1515   NA 139 07/28/2012 1858   K 4.3 10/20/2020 1515   K 3.7 07/28/2012 1858   CL 104 10/20/2020 1515   CL 107 07/28/2012 1858   CO2 31 10/20/2020 1515   CO2 27 07/28/2012 1858   GLUCOSE 117 (H) 10/20/2020 1515   GLUCOSE 114 (H) 07/28/2012 1858   BUN 18 10/20/2020 1515   BUN 16 07/28/2012 1858   CREATININE 1.07 10/20/2020 1515   CREATININE 1.27 07/28/2012 1858   CALCIUM 9.4 10/20/2020 1515   CALCIUM 8.9 07/28/2012 1858   PROT 6.7 10/20/2020 1515   ALBUMIN 4.5 10/20/2020 1515   AST 22 10/20/2020 1515   ALT 16 10/20/2020 1515   ALKPHOS 60 10/20/2020 1515   BILITOT 0.9 10/20/2020 1515   GFRNONAA >60 08/24/2020 1226   GFRNONAA 56 (L) 07/28/2012 1858   GFRAA >60 02/08/2015 1128   GFRAA >60 07/28/2012 1858         Assessment &  Plan:  80 yo male with PMH of chronic idiopathic pancreatitis, gastritis and hyperplastic gastric polyp, GERD, B12 def, HTN, HL, prior CVA who is here for follow-up.    Chronic idiopathic pancreatitis --doing very well with no recent episodes of acute pancreatitis.  No evidence for significant pancreatic insufficiency, malnutrition and he does not have chronic pain.  We discussed that [name] works best when taken with meals and if possible due to cost constraints I would like him to take 1 capsule 3 times a day with meals.  If he does not eat he does not need supplementation. --[name] 1 capsule with meals --He can still use loperamide if needed for loose stools, though this medicine would likely not be needed with appropriate [name] dosing  2.  GERD --well-controlled on pantoprazole.  No dysphagia or upper GI alarm symptoms --Continue pantoprazole 40 mg daily  3.  B12 deficiency --doing well with IM B12 every 30 days, continue  4.  CRC screening --no  polyps a colonoscopy in 2021.  Screening/surveillance colonoscopies discontinued due to age.  He can follow-up annually, sooner if needed, okay to refill until appointment  30 minutes total spent today including patient facing time, coordination of care, reviewing medical history/procedures/pertinent radiology studies, and documentation of the encounter.

## 2021-02-02 NOTE — Progress Notes (Signed)
Cardiology Office Note:    Date:  02/02/2021   ID:  HAIDAR MUSE, DOB 24-Jul-1940, MRN [id number]  PCP:  Lesleigh Noe, MD   [facility] Providers Cardiologist:  None     Referring MD: Martyn Ehrich, NP    History of Present Illness:    Cody Willis is a 80 y.o. male here for the evaluation of aortic atherosclerosis at the request of Geraldo Pitter, NP.  Review of prior office visit from 10/28/2020, pulmonary clinic, he is a former smoker quit in 2016 after 50-pack-year history and has COPD hypertension chronic pancreatitis acid reflux B12 deficiency history of stroke hyperlipidemia melanoma and prediabetes.  He was having dyspnea on exertion.  His FEV1 was 65%.  Imaging of the lungs in October 21, 2020 showed no evidence of interstitial lung disease.  Mild emphysema.  He did have aortic atherosclerosis in addition to left main and three-vessel coronary artery disease/calcification.  Reviewed CT scan personally and interpreted the aortic atherosclerosis as well as triple-vessel coronary artery disease findings.  Occasionally has sharp left sided chest pain.  Moderate induration lasting few minutes duration off and on.  Brother 3 years younger had a heart problem on the back side of heart, treated medically. He died 2 years ago.   Had a heart cath, Dr. Gwenlyn Found, 30 years ago he says, 20%-30% narrowing.   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 started on plavix    Past Surgical History:  Procedure Laterality Date   APPENDECTOMY     BACK SURGERY     Diskectomy  between L4 and L5   CATARACT EXTRACTION W/ INTRAOCULAR LENS  IMPLANT, BILATERAL  12/15 and 2/16   CHOLECYSTECTOMY     COLONOSCOPY  2011   MELANOMA EXCISION  ~2009   right upper abdomen    Current Medications: Current Meds  Medication Sig   albuterol (VENTOLIN HFA) 108 (90 Base) MCG/ACT inhaler Inhale 2 puffs into the lungs every 6 (six) hours as needed for wheezing or shortness of breath.   aspirin EC 81 MG tablet Take 1 tablet (81 mg total) by mouth daily. Swallow whole.   cyanocobalamin (,VITAMIN B-12,) 1000 MCG/ML injection INJECT 1ML INTO THE MUSCLE EVERY 30 DAYS. PHARMACY-PLEASE PROVIDE APPROPRIATE NEEDLES AND SYRINGES   ketoconazole (NIZORAL) 2 % shampoo apply three times per week, massage into scalp and leave in for 10 minutes before rinsing out   metoprolol tartrate (LOPRESSOR) 50 MG tablet Take 1 tablet (50 mg total) by mouth as directed. Take (1) tablet 2 hours before your CT scan   mupirocin ointment (BACTROBAN) 2 % APPLY DAILY TO ANY SORES OR INJURIES   Pancrelipase, Lip-Prot-Amyl, ([name]) 25000-79000 units CPEP TAKE 2 CAPSULES BY MOUTH 3 TIMES DAILY WITH MEALS.   pantoprazole (PROTONIX) 40 MG tablet Take 1 tablet (40 mg total) by mouth every morning.   rosuvastatin (CRESTOR) 20 MG tablet Take 1 tablet (20 mg total) by mouth daily.   triamcinolone (KENALOG)  0.025 % ointment daily as needed for rash.   [DISCONTINUED] aspirin EC 325 MG tablet Take 1 tablet (325 mg total) by mouth daily.   [DISCONTINUED] lovastatin (MEVACOR) 40 MG tablet TAKE 1 TABLET BY MOUTH ONCE DAILY AT BEDTIME     Allergies:   Plavix [clopidogrel bisulfate] and Sulfamethoxazole-trimethoprim   Social History   Socioeconomic History   Marital status: Married    Spouse name: [name]   Number of children: 2   Years of education: high school, some college   Highest education level: Not on file  Occupational History   Occupation: Truck driver-- local    Comment: part time  Tobacco Use   Smoking status:  Former    Packs/day: 1.00    Years: 50.00    Pack years: 50.00    Types: Cigarettes    Quit date: 01/16/2015    Years since quitting: 6.0   Smokeless tobacco: Never  Vaping Use   Vaping Use: Never used  Substance and Sexual Activity   Alcohol use: No    Alcohol/week: 0.0 standard drinks   Drug use: No   Sexual activity: Not Currently  Other Topics Concern   Not on file  Social History Narrative   No living will   Wife should make decisions for him if needed   Would accept resuscitation attempts   Not sure about tube feeds   Lives [name]   2 children and 2 step children - some children live nearby   Has grandchildren and step grandchildren - 74 total   Enjoys: work   Work - Administrator, has to stay in good shape for that   Social Determinants of Radio broadcast assistant Strain: Not on Art therapist Insecurity: Not on file  Transportation Needs: Not on file  Physical Activity: Not on file  Stress: Not on file  Social Connections: Not on file     Family History: The patient's family history includes Colitis in his brother; Crohn's disease in his sister; Diabetes in his mother; Emphysema in his father; Stroke in his mother. There is no history of Colon cancer, Colon polyps, Esophageal cancer, Rectal cancer, or Stomach cancer.  ROS:   Please see the history of present illness.    No fevers chills nausea vomiting syncope bleeding all other systems reviewed and are negative.  EKGs/Labs/Other Studies Reviewed:    The following studies were reviewed today:  CT scan of chest 10/21/2020-triple-vessel CAD, aortic atherosclerosis  Echocardiogram 01/17/2015: - Left ventricle: The cavity size was normal. Wall thickness was    normal. Systolic function was normal. The estimated ejection    fraction was in the range of 60% to 65%. Wall motion was normal;    there were no regional wall motion abnormalities. Left    ventricular diastolic function parameters were normal.  -  Pulmonary arteries: PA peak pressure: 33 mm Hg (S).   EKG:  EKG is  ordered today.  The ekg ordered today demonstrates SR 60  Recent Labs: 08/24/2020: Hemoglobin 13.5; Platelets 196 10/20/2020: ALT 16; BUN 18; Creatinine, Ser 1.07; Potassium 4.3; Sodium 142  Recent Lipid Panel    Component Value Date/Time   CHOL 138 10/20/2020 1515   TRIG 247.0 (H) 10/20/2020 1515   HDL 39.30 10/20/2020 1515   CHOLHDL 4 10/20/2020 1515   VLDL 49.4 (H) 10/20/2020 1515   LDLCALC 88 08/20/2019 1030   LDLDIRECT 76.0 10/20/2020 1515     Risk Assessment/Calculations:  Physical Exam:    VS:  BP 140/70 (BP Location: Left Arm, Patient Position: Sitting, Cuff Size: Normal)   Pulse 60   Ht 5\' 6"  (1.676 m)   Wt 164 lb (74.4 kg)   SpO2 97%   BMI 26.47 kg/m     Wt Readings from Last 3 Encounters:  02/02/21 164 lb (74.4 kg)  02/02/21 164 lb (74.4 kg)  10/28/20 163 lb (73.9 kg)     GEN:  Well nourished, well developed in no acute distress HEENT: Normal NECK: No JVD; No carotid bruits LYMPHATICS: No lymphadenopathy CARDIAC: RRR, no murmurs, rubs, gallops RESPIRATORY:  Clear to auscultation without rales, wheezing or rhonchi  ABDOMEN: Soft, non-tender, non-distended MUSCULOSKELETAL:  No edema; No deformity  SKIN: Warm and dry NEUROLOGIC:  Alert and oriented x 3 PSYCHIATRIC:  Normal affect   ASSESSMENT:    1. Precordial pain   2. Coronary artery disease involving native coronary artery of native heart without angina pectoris   3. Mixed hyperlipidemia   4. History of CVA (cerebrovascular accident)   5. Aortic atherosclerosis ([place])   6. Shortness of breath   7. Chest pain of uncertain etiology   8. Carotid bruit, unspecified laterality   9. Essential hypertension   10. Pre-procedure lab exam   11. Medication management    PLAN:    In order of problems listed above:  Coronary artery disease involving native coronary artery of native heart without angina pectoris Fairly  significant/diffuse coronary calcification noted in multi vessels.  Given his prior longstanding smoking history, symptoms of dyspnea on exertion which in part may be anginal equivalent, I will go ahead and proceed with coronary CT scan with possible FFR analysis to evaluate for potential surgical disease. Aspirin 81 mg would be reasonable.  Change lovastatin 40 mg over to Crestor 20 mg.  High intensity statin.  LDL goal less than 70.  Mixed hyperlipidemia Change lovastatin 40 mg over to Crestor 20 mg.  High intensity statin.  LDL goal less than 70.  History of CVA (cerebrovascular accident) Agree with low-dose aspirin 81 mg.  Intensify statin regimen.  LDL goal less than 70.  At last check LDL was 76.  Aortic atherosclerosis (HCC) Statin, aspirin, blood pressure control  Dyspnea We will check echocardiogram.  Has been several years since this was last evaluated.  Chest pain of uncertain etiology Checking coronary CT.  Carotid bruit Checking carotid Dopplers.  He has known triple-vessel coronary artery disease.  Could have carotid artery disease as well.  Essential hypertension Previously his antihypertensive was stopped.  His blood pressures have been in the 832 systolic at times.  Keep an eye on this.  We may need to reinitiate.         Medication Adjustments/Labs and Tests Ordered: Current medicines are reviewed at length with the patient today.  Concerns regarding medicines are outlined above.  Orders Placed This Encounter  Procedures   CT CORONARY MORPH W/CTA COR W/SCORE W/CA W/CM &/OR WO/CM   ALT   Lipid panel   Basic metabolic panel   EKG 54-DIYM   ECHOCARDIOGRAM COMPLETE   VAS US CAROTID    Meds ordered this encounter  Medications   aspirin EC 81 MG tablet    Sig: Take 1 tablet (81 mg total) by mouth daily. Swallow whole.    Dispense:  90 tablet    Refill:  3   metoprolol tartrate (LOPRESSOR) 50 MG tablet    Sig: Take 1 tablet (50 mg total) by  mouth as  directed. Take (1) tablet 2 hours before your CT scan    Dispense:  1 tablet    Refill:  0   rosuvastatin (CRESTOR) 20 MG tablet    Sig: Take 1 tablet (20 mg total) by mouth daily.    Dispense:  90 tablet    Refill:  3     Patient Instructions  Medication Instructions:  Please decrease Aspirin to 81 mg a day. Discontinue your Mevacor. Start Crestor 20 mg a day. Continue all other medications as listed.  *If you need a refill on your cardiac medications before your next appointment, please call your pharmacy*  Lab Work: Please have blood work today (BMP) And in 3 months (Lipid/ALT)  If you have labs (blood work) drawn today and your tests are completely normal, you will receive your results only by: [place] (if you have MyChart) OR A paper copy in the mail If you have any lab test that is abnormal or we need to change your treatment, we will call you to review the results.   Testing/Procedures: Your physician has requested that you have an echocardiogram. Echocardiography is a painless test that uses sound waves to create images of your heart. It provides your doctor with information about the size and shape of your heart and how well your heart's chambers and valves are working. This procedure takes approximately one hour. There are no restrictions for this procedure.  Your physician has requested that you have a carotid duplex. This test is an ultrasound of the carotid arteries in your neck. It looks at blood flow through these arteries that supply the brain with blood. Allow one hour for this exam. There are no restrictions or special instructions.    Your cardiac CT will be scheduled at one of the below locations:   [facility] [address] [place], [place] [address] [phone]  Please arrive at the [facility] main entrance (entrance A) of [facility] 30 minutes prior to test start time. You can use the FREE valet parking offered at  the main entrance (encouraged to control the heart rate for the test) Proceed to the [facility] Radiology Department (first floor) to check-in and test prep.  Please follow these instructions carefully (unless otherwise directed):  Hold all erectile dysfunction medications at least 3 days (72 hrs) prior to test.  On the Night Before the Test: Be sure to Drink plenty of water. Do not consume any caffeinated/decaffeinated beverages or chocolate 12 hours prior to your test. Do not take any antihistamines 12 hours prior to your test.  On the Day of the Test: Drink plenty of water until 1 hour prior to the test. Do not eat any food 4 hours prior to the test. You may take your regular medications prior to the test.  Take metoprolol (Lopressor) two hours prior to test. HOLD Furosemide/Hydrochlorothiazide morning of the test.  After the Test: Drink plenty of water. After receiving IV contrast, you may experience a mild flushed feeling. This is normal. On occasion, you may experience a mild rash up to 24 hours after the test. This is not dangerous. If this occurs, you can take Benadryl 25 mg and increase your fluid intake. If you experience trouble breathing, this can be serious. If it is severe call 911 IMMEDIATELY. If it is mild, please call our office. If you take any of these medications: Glipizide/Metformin, Avandament, Glucavance, please do not take 48 hours after completing test unless  otherwise instructed.  Please allow 2-4 weeks for scheduling of routine cardiac CTs. Some insurance companies require a pre-authorization which may delay scheduling of this test.   For non-scheduling related questions, please contact the cardiac imaging nurse navigator should you have any questions/concerns: Marchia Bond, Cardiac Imaging Nurse Navigator Gordy Clement, Cardiac Imaging Nurse Navigator  Heart and Vascular Services Direct Office Dial: [phone]   For scheduling needs,  including cancellations and rescheduling, please call [place], [phone].  Follow-Up: At [facility], you and your health needs are our priority.  As part of our continuing mission to provide you with exceptional heart care, we have created designated Provider Care Teams.  These Care Teams include your primary Cardiologist (physician) and Advanced Practice Providers (APPs -  Physician Assistants and Nurse Practitioners) who all work together to provide you with the care you need, when you need it.  We recommend signing up for the patient portal called "MyChart".  Sign up information is provided on this After Visit Summary.  MyChart is used to connect with patients for Virtual Visits (Telemedicine).  Patients are able to view lab/test results, encounter notes, upcoming appointments, etc.  Non-urgent messages can be sent to your provider as well.   To learn more about what you can do with MyChart, go to [email or web address].    Your next appointment:   6 month(s)  The format for your next appointment:   In Person  Provider:   Candee Furbish, MD   Thank you for choosing [facility]!!     Signed, Candee Furbish, MD  02/02/2021 12:30 PM    [place]

## 2021-02-02 NOTE — Assessment & Plan Note (Signed)
Fairly significant/diffuse coronary calcification noted in multi vessels.  Given his prior longstanding smoking history, symptoms of dyspnea on exertion which in part may be anginal equivalent, I will go ahead and proceed with coronary CT scan with possible FFR analysis to evaluate for potential surgical disease. Aspirin 81 mg would be reasonable.  Change lovastatin 40 mg over to Crestor 20 mg.  High intensity statin.  LDL goal less than 70.

## 2021-02-02 NOTE — Assessment & Plan Note (Signed)
Checking carotid Dopplers.  He has known triple-vessel coronary artery disease.  Could have carotid artery disease as well.

## 2021-02-02 NOTE — Assessment & Plan Note (Signed)
Checking coronary CT.

## 2021-02-09 ENCOUNTER — Telehealth (HOSPITAL_COMMUNITY): Payer: Self-pay | Admitting: *Deleted

## 2021-02-09 NOTE — Telephone Encounter (Signed)
Attempted to call patient regarding upcoming cardiac CT appointment. °Left message on voicemail with name and callback number ° °Dmari Schubring RN Navigator Cardiac Imaging °[facility] °[phone] Office °[phone] Cell ° °

## 2021-02-10 ENCOUNTER — Ambulatory Visit (HOSPITAL_COMMUNITY)
Admission: RE | Admit: 2021-02-10 | Discharge: 2021-02-10 | Disposition: A | Discharge status: Home / Self Care | Payer: PPO | Source: Ambulatory Visit | Attending: Cardiology | Admitting: Cardiology

## 2021-02-10 ENCOUNTER — Other Ambulatory Visit: Payer: Self-pay

## 2021-02-10 DIAGNOSIS — R0602 Shortness of breath: Secondary | ICD-10-CM | POA: Insufficient documentation

## 2021-02-10 DIAGNOSIS — I2510 Atherosclerotic heart disease of native coronary artery without angina pectoris: Secondary | ICD-10-CM | POA: Insufficient documentation

## 2021-02-10 DIAGNOSIS — J449 Chronic obstructive pulmonary disease, unspecified: Secondary | ICD-10-CM | POA: Diagnosis not present

## 2021-02-10 DIAGNOSIS — R0989 Other specified symptoms and signs involving the circulatory and respiratory systems: Secondary | ICD-10-CM | POA: Insufficient documentation

## 2021-02-10 DIAGNOSIS — R072 Precordial pain: Secondary | ICD-10-CM | POA: Insufficient documentation

## 2021-02-10 DIAGNOSIS — I10 Essential (primary) hypertension: Secondary | ICD-10-CM | POA: Insufficient documentation

## 2021-02-10 DIAGNOSIS — I700 Atherosclerosis of aorta: Secondary | ICD-10-CM | POA: Diagnosis not present

## 2021-02-10 DIAGNOSIS — Z8673 Personal history of transient ischemic attack (TIA), and cerebral infarction without residual deficits: Secondary | ICD-10-CM | POA: Insufficient documentation

## 2021-02-10 MED ORDER — NITROGLYCERIN 0.4 MG SL SUBL
0.8000 mg | SUBLINGUAL_TABLET | Freq: Once | SUBLINGUAL | Status: AC
  Administered 2021-02-10: 0.8 mg via SUBLINGUAL

## 2021-02-10 MED ORDER — NITROGLYCERIN 0.4 MG SL SUBL
SUBLINGUAL_TABLET | SUBLINGUAL | Status: AC
  Filled 2021-02-10: qty 2

## 2021-02-10 MED ORDER — IOHEXOL 350 MG/ML SOLN
100.0000 mL | Freq: Once | INTRAVENOUS | Status: AC | PRN
  Administered 2021-02-10: 100 mL via INTRAVENOUS

## 2021-02-24 ENCOUNTER — Ambulatory Visit (HOSPITAL_COMMUNITY): Payer: PPO | Attending: Internal Medicine

## 2021-02-24 ENCOUNTER — Other Ambulatory Visit: Payer: Self-pay

## 2021-02-24 DIAGNOSIS — R079 Chest pain, unspecified: Secondary | ICD-10-CM | POA: Diagnosis not present

## 2021-02-24 DIAGNOSIS — R0602 Shortness of breath: Secondary | ICD-10-CM | POA: Diagnosis not present

## 2021-02-24 LAB — ECHOCARDIOGRAM COMPLETE
Area-P 1/2: 2.73 cm2
S' Lateral: 2.5 cm

## 2021-03-03 ENCOUNTER — Other Ambulatory Visit: Payer: Self-pay

## 2021-03-03 ENCOUNTER — Ambulatory Visit (HOSPITAL_COMMUNITY)
Admission: EM | Admit: 2021-03-03 | Discharge: 2021-03-03 | Disposition: A | Discharge status: Home / Self Care | Payer: PPO | Attending: Emergency Medicine | Admitting: Emergency Medicine

## 2021-03-03 ENCOUNTER — Encounter (HOSPITAL_COMMUNITY): Payer: Self-pay | Admitting: Emergency Medicine

## 2021-03-03 DIAGNOSIS — J441 Chronic obstructive pulmonary disease with (acute) exacerbation: Secondary | ICD-10-CM | POA: Diagnosis not present

## 2021-03-03 DIAGNOSIS — Z20822 Contact with and (suspected) exposure to covid-19: Secondary | ICD-10-CM | POA: Diagnosis not present

## 2021-03-03 LAB — POC INFLUENZA A AND B ANTIGEN (URGENT CARE ONLY)
INFLUENZA A ANTIGEN, POC: NEGATIVE
INFLUENZA B ANTIGEN, POC: NEGATIVE

## 2021-03-03 LAB — SARS CORONAVIRUS 2 (TAT 6-24 HRS): SARS Coronavirus 2: NEGATIVE

## 2021-03-03 MED ORDER — PREDNISONE 20 MG PO TABS: 40.0000 mg | ORAL_TABLET | Freq: Every day | ORAL | 0 refills | Status: AC

## 2021-03-03 MED ORDER — AMOXICILLIN-POT CLAVULANATE 875-125 MG PO TABS: 1.0000 | ORAL_TABLET | Freq: Two times a day (BID) | ORAL | 0 refills | Status: AC

## 2021-03-03 MED ORDER — ALBUTEROL SULFATE HFA 108 (90 BASE) MCG/ACT IN AERS: 1.0000 | INHALATION_SPRAY | RESPIRATORY_TRACT | 0 refills | Status: DC | PRN

## 2021-03-03 MED ORDER — AEROCHAMBER PLUS MISC: 2 refills | Status: DC

## 2021-03-03 NOTE — ED Provider Notes (Signed)
HPI  SUBJECTIVE:  Cody Willis is a 80 y.o. male who presents with a cough productive of yellowish-gray sputum for the past 3 days.  He states that he is coughing up more sputum and it is a different color than normal.  He reports wheezing, clear rhinorrhea, postnasal drip starting this morning.  Headache yesterday.  No fevers, body aches, nasal congestion, loss of sense of smell or taste, chest pain, shortness of breath, dyspnea on exertion, nausea, vomiting, fevers, abdominal pain.  He had a negative home COVID test yesterday.  No known COVID or flu exposure.  He got the third dose of the COVID vaccine.  He did not get this years flu vaccine.  No antibiotics in the past 3 months.  No antipyretic in the past 6 hours.  He has been taking Aleve and Mucinex with improvement in his symptoms.  No aggravating factors.  He has a past medical history of COPD, is not on any medications for it.  He also has a history of hypertension, aortic atherosclerosis, coronary artery disease, stroke.  PMD: Lesleigh Noe, MD   Past Medical History:  Diagnosis Date   Arthritis    fingers   Basal cell carcinoma 02/10/2008   left top of shoulder   Cataract    bilateral surgery repair   Chronic pancreatitis ([place])    COVID-19    Esophageal reflux    Essential hypertension, benign    Gastric polyps    hyperplastic   Herpes zoster    Hiatal hernia    Other B-complex deficiencies    Pure hypercholesterolemia    Stroke ([place]) 01/16/2015   Pt. started on plavix    Past Surgical History:  Procedure Laterality Date   APPENDECTOMY     BACK SURGERY     Diskectomy between L4 and L5   CATARACT EXTRACTION W/ INTRAOCULAR LENS  IMPLANT, BILATERAL  12/15 and 2/16   CHOLECYSTECTOMY     COLONOSCOPY  2011   MELANOMA EXCISION  ~2009   right upper abdomen    Family History  Problem Relation Age of Onset   Stroke Mother    Diabetes Mother    Emphysema Father        smoker   Crohn's disease Sister    Colitis  Brother    Colon cancer Neg Hx    Colon polyps Neg Hx    Esophageal cancer Neg Hx    Rectal cancer Neg Hx    Stomach cancer Neg Hx     Social History   Tobacco Use   Smoking status: Former    Packs/day: 1.00    Years: 50.00    Pack years: 50.00    Types: Cigarettes    Quit date: 01/16/2015    Years since quitting: 6.1   Smokeless tobacco: Never  Vaping Use   Vaping Use: Never used  Substance Use Topics   Alcohol use: No    Alcohol/week: 0.0 standard drinks   Drug use: No    No current facility-administered medications for this encounter.  Current Outpatient Medications:    albuterol (VENTOLIN HFA) 108 (90 Base) MCG/ACT inhaler, Inhale 1-2 puffs into the lungs every 4 (four) hours as needed for wheezing or shortness of breath., Disp: 1 each, Rfl: 0   amoxicillin-clavulanate (AUGMENTIN) 875-125 MG tablet, Take 1 tablet by mouth 2 (two) times daily for 7 days., Disp: 14 tablet, Rfl: 0   predniSONE (DELTASONE) 20 MG tablet, Take 2 tablets (40 mg total) by mouth daily  with breakfast for 5 days., Disp: 10 tablet, Rfl: 0   Spacer/Aero-Holding Chambers (AEROCHAMBER PLUS) inhaler, Use with inhaler, Disp: 1 each, Rfl: 2   aspirin EC 81 MG tablet, Take 1 tablet (81 mg total) by mouth daily. Swallow whole., Disp: 90 tablet, Rfl: 3   cyanocobalamin (,VITAMIN B-12,) 1000 MCG/ML injection, INJECT 1ML INTO THE MUSCLE EVERY 30 DAYS. PHARMACY-PLEASE PROVIDE APPROPRIATE NEEDLES AND SYRINGES, Disp: 3 mL, Rfl: 3   ketoconazole (NIZORAL) 2 % shampoo, apply three times per week, massage into scalp and leave in for 10 minutes before rinsing out, Disp: 120 mL, Rfl: 6   metoprolol tartrate (LOPRESSOR) 50 MG tablet, Take 1 tablet (50 mg total) by mouth as directed. Take (1) tablet 2 hours before your CT scan, Disp: 1 tablet, Rfl: 0   mupirocin ointment (BACTROBAN) 2 %, APPLY DAILY TO ANY SORES OR INJURIES, Disp: 22 g, Rfl: 2   Pancrelipase, Lip-Prot-Amyl, ([name]) 25000-79000 units CPEP, TAKE 2 CAPSULES  BY MOUTH 3 TIMES DAILY WITH MEALS., Disp: 540 capsule, Rfl: 0   pantoprazole (PROTONIX) 40 MG tablet, Take 1 tablet (40 mg total) by mouth every morning., Disp: 90 tablet, Rfl: 3   rosuvastatin (CRESTOR) 20 MG tablet, Take 1 tablet (20 mg total) by mouth daily., Disp: 90 tablet, Rfl: 3   triamcinolone (KENALOG) 0.025 % ointment, daily as needed for rash., Disp: , Rfl:   Allergies  Allergen Reactions   Plavix [Clopidogrel Bisulfate] Itching   Sulfamethoxazole-Trimethoprim Rash     ROS  As noted in HPI.   Physical Exam  BP (!) 159/59   Pulse 76   Temp 97.9 F (36.6 C)   Resp 18   SpO2 97%   Constitutional: Well developed, well nourished, no acute distress Eyes:  EOMI, conjunctiva normal bilaterally HENT: Normocephalic, atraumatic,mucus membranes moist.  Positive nasal congestion.  No maxillary, frontal sinus tenderness.  No postnasal drip. Respiratory: Normal inspiratory effort, poor air movement, faint diffuse expiratory wheezing throughout all lung fields.  No rales or rhonchi.  No anterior, lateral chest wall tenderness Cardiovascular: Normal rate, regular rhythm, no murmurs or gallops GI: nondistended skin: No rash, skin intact Musculoskeletal: no deformities Neurologic: Alert & oriented x 3, no focal neuro deficits Psychiatric: Speech and behavior appropriate   ED Course   Medications - No data to display  Orders Placed This Encounter  Procedures   SARS CORONAVIRUS 2 (TAT 6-24 HRS) Nasopharyngeal Nasopharyngeal Swab    Standing Status:   Standing    Number of Occurrences:   1   POC Influenza A & B Ag (Urgent Care)    Standing Status:   Standing    Number of Occurrences:   1    Results for orders placed or performed during the hospital encounter of 03/03/21 (from the past 24 hour(s))  SARS CORONAVIRUS 2 (TAT 6-24 HRS) Nasopharyngeal Nasopharyngeal Swab     Status: None   Collection Time: 03/03/21 10:22 AM   Specimen: Nasopharyngeal Swab  Result Value Ref  Range   SARS Coronavirus 2 NEGATIVE NEGATIVE  POC Influenza A & B Ag (Urgent Care)     Status: None   Collection Time: 03/03/21 10:55 AM  Result Value Ref Range   INFLUENZA A ANTIGEN, POC NEGATIVE NEGATIVE   INFLUENZA B ANTIGEN, POC NEGATIVE NEGATIVE   No results found.  ED Clinical Impression  1. COPD exacerbation ([place])   2. Encounter for laboratory testing for COVID-19 virus      ED Assessment/Plan  Suspect COPD exacerbation.  Patient looks well, satting well on room air.  He has poor air movement, diffuse expiratory wheezing.  However, will check flu and COVID because it would change management.  Will prescribe Tamiflu if influenza is positive and Molnupiravir if COVID is positive.  Meantime, prednisone 40 mg for 5 days, Augmentin, regularly scheduled albuterol for the next 4 days, then as needed.  Continue Aleve and Mucinex.  Follow-up with PMD if not better in 5 days, ER return precautions given  Rapid influenza negative.  COVID-negative.  Plan as above.  Discussed labs, MDM, treatment plan, and plan for follow-up with patient. Discussed sn/sx that should prompt return to the ED. patient agrees with plan.   Meds ordered this encounter  Medications   amoxicillin-clavulanate (AUGMENTIN) 875-125 MG tablet    Sig: Take 1 tablet by mouth 2 (two) times daily for 7 days.    Dispense:  14 tablet    Refill:  0   predniSONE (DELTASONE) 20 MG tablet    Sig: Take 2 tablets (40 mg total) by mouth daily with breakfast for 5 days.    Dispense:  10 tablet    Refill:  0   Spacer/Aero-Holding Chambers (AEROCHAMBER PLUS) inhaler    Sig: Use with inhaler    Dispense:  1 each    Refill:  2    Please educate patient on use   albuterol (VENTOLIN HFA) 108 (90 Base) MCG/ACT inhaler    Sig: Inhale 1-2 puffs into the lungs every 4 (four) hours as needed for wheezing or shortness of breath.    Dispense:  1 each    Refill:  0      *This clinic note was created using [employer].  Therefore, there may be occasional mistakes despite careful proofreading.  ?    Melynda Ripple, MD 03/04/21 [phone]

## 2021-03-03 NOTE — ED Triage Notes (Signed)
Pt is present today with cough and congestion. Pt sx started Wednesday

## 2021-03-03 NOTE — Discharge Instructions (Addendum)
2 puffs from your albuterol inhaler every 4 hours for 2 days, then every 6 hours for 2 days, then as needed.  He may back off on the albuterol if you start to feel better sooner.  Finish the Augmentin and prednisone, unless a provider tells you to stop.  Continue Aleve and Mucinex.  I will contact you if your flu or COVID come back positive and will prescribe the appropriate medication.  COVID will be back tomorrow.

## 2021-04-06 DIAGNOSIS — H353210 Exudative age-related macular degeneration, right eye, stage unspecified: Secondary | ICD-10-CM | POA: Diagnosis not present

## 2021-04-27 DIAGNOSIS — H353210 Exudative age-related macular degeneration, right eye, stage unspecified: Secondary | ICD-10-CM | POA: Diagnosis not present

## 2021-05-05 ENCOUNTER — Other Ambulatory Visit: Payer: PPO

## 2021-05-26 DIAGNOSIS — R202 Paresthesia of skin: Secondary | ICD-10-CM | POA: Diagnosis not present

## 2021-05-26 DIAGNOSIS — M79642 Pain in left hand: Secondary | ICD-10-CM | POA: Diagnosis not present

## 2021-05-26 DIAGNOSIS — R200 Anesthesia of skin: Secondary | ICD-10-CM | POA: Diagnosis not present

## 2021-05-26 DIAGNOSIS — M79641 Pain in right hand: Secondary | ICD-10-CM | POA: Diagnosis not present

## 2021-06-02 DIAGNOSIS — H6123 Impacted cerumen, bilateral: Secondary | ICD-10-CM | POA: Diagnosis not present

## 2021-06-07 DIAGNOSIS — G5613 Other lesions of median nerve, bilateral upper limbs: Secondary | ICD-10-CM | POA: Diagnosis not present

## 2021-06-16 ENCOUNTER — Encounter (HOSPITAL_BASED_OUTPATIENT_CLINIC_OR_DEPARTMENT_OTHER): Payer: Self-pay | Admitting: Orthopedic Surgery

## 2021-06-16 ENCOUNTER — Other Ambulatory Visit: Payer: Self-pay

## 2021-06-16 ENCOUNTER — Other Ambulatory Visit: Payer: Self-pay | Admitting: Orthopedic Surgery

## 2021-06-16 DIAGNOSIS — G5603 Carpal tunnel syndrome, bilateral upper limbs: Secondary | ICD-10-CM | POA: Diagnosis not present

## 2021-06-20 ENCOUNTER — Telehealth: Payer: Self-pay | Admitting: Cardiology

## 2021-06-20 ENCOUNTER — Telehealth: Payer: Self-pay | Admitting: Family Medicine

## 2021-06-20 NOTE — Telephone Encounter (Signed)
Cody Willis with [facility] called asking if pt should hold on taking his 81 mg asprin and if so how many days prior to surgery which is 06/27/21. Please advise. ?

## 2021-06-20 NOTE — Telephone Encounter (Signed)
? ?  Pre-operative Risk Assessment  ?  ?Patient Name: Cody Willis  ?DOB: 05-02-1940 ?MRN: [id number]  ? ?  ? ?Request for Surgical Clearance   ? ?Procedure:  left carpal tunnel release ? ?Date of Surgery:  Clearance 06/27/21                              ?   ?Surgeon:  Dr. Daryll Brod ?Surgeon's Group or Practice Name:  [facility] ?Phone number:  [phone], Hassan Rowan ?Fax number:  [phone] ?  ?Type of Clearance Requested:   ?- Medical  ?- Pharmacy:  Hold Aspirin need to know if it needs to be held and if so how long ?  ?Type of Anesthesia:  IV regional forearm block ?  ?Additional requests/questions:   ? ?Signed, ?Selena Zobro   ?06/20/2021, 4:14 PM   ?

## 2021-06-20 NOTE — Telephone Encounter (Signed)
Attempted to call Cody Willis back. Left vm asking for call back again.  ?

## 2021-06-20 NOTE — Telephone Encounter (Signed)
? ?  Name: Cody Willis  ?DOB: 25-May-1940  ?MRN: [id number]  ? ?Primary Cardiologist: None ? ?Chart reviewed as part of pre-operative protocol coverage. Patient was contacted 06/20/2021 in reference to pre-operative risk assessment for pending surgery as outlined below.  Cody Willis was last seen on 02/02/2021 by Dr. Marlou Porch.  Since that day, Cody Willis has done well without exertional chest pain or worsening dyspnea.  Recent coronary CT in November 2022 showed mild CAD with no significant obstruction.  Echocardiogram was normal. ? ?Therefore, based on ACC/AHA guidelines, the patient would be at acceptable risk for the planned procedure without further cardiovascular testing.  ? ?He may hold aspirin for 5 days prior to the procedure and restart as soon as possible afterward at the surgeon's discretion. ? ?The patient was advised that if he develops new symptoms prior to surgery to contact our office to arrange for a follow-up visit, and he verbalized understanding. ? ?I will route this recommendation to the requesting party via Epic fax function and remove from pre-op pool. Please call with questions. ? ?Almyra Deforest, [place] ?06/20/2021, 6:47 PM ? ?

## 2021-06-20 NOTE — Telephone Encounter (Signed)
Preoperative clearance paperwork has not been completed as patient has not been seen in several months. ? ? ?Would recommend office visit to discuss risks with patient. ? ? ? ?

## 2021-06-20 NOTE — Telephone Encounter (Signed)
Pt is not scheduled for preop visit until 06/26/2021 ? ?ASA 81 mg started by cardiology who saw pt in November. Would recommend Hassan Rowan reach out to Cardiologist for guidance of holding in advance of surgery as I have not seen pt and did not start medication.  ?

## 2021-06-20 NOTE — Telephone Encounter (Signed)
Left message for Cody Willis to call me back .  ?

## 2021-06-20 NOTE — Telephone Encounter (Signed)
Cody Willis returning your call. Cody Willis states she is getting ready to go to lunch if you will call her back around 2:00. ?

## 2021-06-22 ENCOUNTER — Ambulatory Visit: Payer: PPO | Admitting: Dermatology

## 2021-06-26 ENCOUNTER — Other Ambulatory Visit: Payer: Self-pay

## 2021-06-26 ENCOUNTER — Ambulatory Visit (INDEPENDENT_AMBULATORY_CARE_PROVIDER_SITE_OTHER): Payer: PPO | Admitting: Family Medicine

## 2021-06-26 ENCOUNTER — Encounter: Payer: Self-pay | Admitting: Family Medicine

## 2021-06-26 VITALS — BP 124/70 | HR 63 | Ht 66.0 in | Wt 168.8 lb

## 2021-06-26 DIAGNOSIS — I10 Essential (primary) hypertension: Secondary | ICD-10-CM

## 2021-06-26 DIAGNOSIS — E782 Mixed hyperlipidemia: Secondary | ICD-10-CM | POA: Diagnosis not present

## 2021-06-26 DIAGNOSIS — J449 Chronic obstructive pulmonary disease, unspecified: Secondary | ICD-10-CM | POA: Diagnosis not present

## 2021-06-26 DIAGNOSIS — I2510 Atherosclerotic heart disease of native coronary artery without angina pectoris: Secondary | ICD-10-CM | POA: Diagnosis not present

## 2021-06-26 DIAGNOSIS — D72828 Other elevated white blood cell count: Secondary | ICD-10-CM

## 2021-06-26 LAB — COMPREHENSIVE METABOLIC PANEL
ALT: 15 U/L (ref 0–53)
AST: 19 U/L (ref 0–37)
Albumin: 4.6 g/dL (ref 3.5–5.2)
Alkaline Phosphatase: 52 U/L (ref 39–117)
BUN: 22 mg/dL (ref 6–23)
CO2: 32 mEq/L (ref 19–32)
Calcium: 9.3 mg/dL (ref 8.4–10.5)
Chloride: 104 mEq/L (ref 96–112)
Creatinine, Ser: 1.06 mg/dL (ref 0.40–1.50)
GFR: 66.49 mL/min (ref >60.00)
Glucose, Bld: 101 mg/dL — ABNORMAL HIGH (ref 70–99)
Potassium: 4.7 mEq/L (ref 3.5–5.1)
Sodium: 141 mEq/L (ref 135–145)
Total Bilirubin: 0.9 mg/dL (ref 0.2–1.2)
Total Protein: 6.6 g/dL (ref 6.0–8.3)

## 2021-06-26 LAB — CBC
HCT: 42.8 % (ref 39.0–52.0)
Hemoglobin: 14.6 g/dL (ref 13.0–17.0)
MCHC: 34.2 g/dL (ref 30.0–36.0)
MCV: 89.4 fl (ref 78.0–100.0)
Platelets: 155 10*3/uL (ref 150.0–400.0)
RBC: 4.79 Mil/uL (ref 4.22–5.81)
RDW: 14.7 % (ref 11.5–15.5)
WBC: 8.9 10*3/uL (ref 4.0–10.5)

## 2021-06-26 LAB — LIPID PANEL
Cholesterol: 114 mg/dL (ref 0–200)
HDL: 45.5 mg/dL (ref >39.00)
LDL Cholesterol: 47 mg/dL (ref 0–99)
NonHDL: 68.59
Total CHOL/HDL Ratio: 3
Triglycerides: 107 mg/dL (ref 0.0–149.0)
VLDL: 21.4 mg/dL (ref 0.0–40.0)

## 2021-06-26 NOTE — Patient Instructions (Signed)
Labs today ?I will send in paperwork for surgery ?Good luck tomorrow! ?

## 2021-06-26 NOTE — Assessment & Plan Note (Signed)
Blood pressure at goal.  Not currently needing medication ?

## 2021-06-26 NOTE — Assessment & Plan Note (Signed)
Recently switched to Crestor 20 mg.  Repeat cholesterol today. ?

## 2021-06-26 NOTE — Progress Notes (Signed)
? ? Subjective:  ? ? Cody Willis is a 81 y.o. male who presents to the office today for a preoperative consultation at the request of surgeon Daryll Brod who plans on performing Carpal Tunnel Release on March 20.  ? ?This consultation is requested for the specific conditions prompting preoperative evaluation (i.e. because of potential affect on operative risk): COPD and heart disease. Planned anesthesia is regional. The patient has the following known anesthesia issues:  n/a .  ? ? ?Patient has a bleeding risk of: no recent abnormal bleeding. Patient does not have objections to receiving blood products if needed. ? ?Respiratory Risk Factors:  ?Denies: cough, hemoptysis, wheezing, or dyspnea on exertion ?Social History  ? ?Tobacco Use  ? Smoking status: Former  ?  Packs/day: 1.00  ?  Years: 50.00  ?  Pack years: 50.00  ?  Types: Cigarettes  ?  Quit date: 01/16/2015  ?  Years since quitting: 6.4  ? Smokeless tobacco: Never  ?Substance Use Topics  ? Alcohol use: No  ?  Alcohol/week: 0.0 standard drinks  ? ? ?If symptoms present: Consider pulmonary function testing or peak flow, CXR, ECG (>40 yo), hemoglobin, glucose (>45 yo) ? ?Cardiac Risk Factors ?Age >10, other risks ?If presents - should obtain ECG. Further tests indicated if ECG with abnormalities ? ? ?METs - Is able to complete exercise of 4 or more METs Yes ?> 4 METs: climbing 1 flight of stairs, mowing the lawn (walking), gardening, golfing w/o a cart, doubles tennis, swimming, riding a bike, square dancing, jogging ? ? ?Patient is having a Intermediate Risk risk surgery.  ?High Risk surgery: emergency surgery, anticipated increased blood loss, aortic or peripheral vascular surgery ?Intermediate Risk: Abdominal/thoracic, head and neck, carotid endarterectomy, orthopedic surgery, prostate surgery ?Low Risk: Breast surgery, cataract surgery, superficial surgery, endoscopy ? ? ?The following portions of the patient's history were reviewed and updated as  appropriate: allergies, current medications, past family history, past medical history, past social history, past surgical history, and problem list. ? ?Review of Systems ?Review of Systems  ?Constitutional:  Negative for chills and fever.  ?HENT:  Negative for congestion and sore throat.   ?Eyes:  Negative for blurred vision and double vision.  ?Respiratory:  Negative for shortness of breath.   ?Cardiovascular:  Negative for chest pain.  ?Gastrointestinal:  Negative for heartburn, nausea and vomiting.  ?Genitourinary: Negative.   ?Musculoskeletal: Negative.  Negative for myalgias.  ?Skin:  Negative for rash.  ?Neurological:  Negative for dizziness and headaches.  ?Endo/Heme/Allergies:  Does not bruise/bleed easily.  ?Psychiatric/Behavioral:  Negative for depression. The patient is not nervous/anxious.   ? ?  ? Objective:  ? ?  ? ?Physical Exam ?BP Readings from Last 3 Encounters:  ?06/26/21 124/70  ?03/03/21 (!) 159/59  ?02/10/21 111/72  ? ?Wt Readings from Last 3 Encounters:  ?06/26/21 168 lb 12.8 oz (76.6 kg)  ?02/02/21 164 lb (74.4 kg)  ?02/02/21 164 lb (74.4 kg)  ? ? ?Physical Exam ?Constitutional:   ?   Appearance: Normal appearance. He is not ill-appearing or diaphoretic.  ?HENT:  ?   Head: Normocephalic and atraumatic.  ?   Right Ear: External ear normal.  ?   Left Ear: External ear normal.  ?   Nose: Nose normal. No rhinorrhea.  ?   Mouth/Throat:  ?   Dentition: Has dentures.  ?   Pharynx: No oropharyngeal exudate or posterior oropharyngeal erythema.  ?Eyes:  ?   General: No scleral icterus. ?  Extraocular Movements: Extraocular movements intact.  ?   Conjunctiva/sclera: Conjunctivae normal.  ?Cardiovascular:  ?   Rate and Rhythm: Normal rate and regular rhythm.  ?   Heart sounds: No murmur heard. ?Pulmonary:  ?   Effort: Pulmonary effort is normal. No respiratory distress.  ?   Breath sounds: Normal breath sounds. No wheezing.  ?Musculoskeletal:  ?   Cervical back: Neck supple.  ?Lymphadenopathy:  ?    Cervical: No cervical adenopathy.  ?Skin: ?   General: Skin is warm and dry.  ?Neurological:  ?   Mental Status: He is alert. Mental status is at baseline.  ?Psychiatric:     ?   Mood and Affect: Mood normal.  ? ? ? ?Predictors of intubation difficulty: ? Morbid obesity? no ? Anatomically abnormal facies? no ? Prominent incisors? no ? Receding mandible? no ? Short, thick neck? no ? Neck range of motion: normal ? ? ?Cardiographics ?ECG: 02/02/2021 - NSR, no st changes, no t wave abnormalities ?Echocardiogram: ECHOCARDIOGRAM COMPLETE ?   ECHOCARDIOGRAM REPORT   ? ?  ? ?Patient Name:   Cody Willis Date of Exam: 02/24/2021 ?Medical Rec #:  [id number]        Height:       66.0 in ?Accession #:    [id number]       Weight:       164.0 lb ?Date of Birth:  09-05-1940        BSA:          1.838 m? ?Patient Age:    34 years         BP:           140/70 mmHg ?Patient Gender: M                HR:           68 bpm. ?Exam Location:  [facility] ? ?Procedure: 2D Echo, Cardiac Doppler and Color Doppler ? ?Indications:    R06.00 Dyspnea ?  ?History:        Patient has prior history of Echocardiogram examinations, most ?                recent 01/17/2015. CAD, Stroke and COPD, Signs/Symptoms:Chest ?                Pain; Risk Factors:Hypertension, Dyslipidemia and Former Smoker. ?                Prediabetes. Aortic atherosclerosis. COVID-19. Carotid bruit. ?  ?Sonographer:    Diamond Nickel RCS ?Referring Phys: Jerline Pain ? ?IMPRESSIONS ? ? 1. Left ventricular ejection fraction, by estimation, is 60 to 65%. The left ventricle has normal function. The left ventricle has no regional wall motion abnormalities. Left ventricular diastolic parameters are consistent with Grade I diastolic  ?dysfunction (impaired relaxation). ? 2. Right ventricular systolic function is normal. The right ventricular size is normal. Tricuspid regurgitation signal is inadequate for assessing PA pressure. ? 3. The mitral valve is normal in structure. No  evidence of mitral valve regurgitation. No evidence of mitral stenosis. ? 4. The aortic valve is tricuspid. Aortic valve regurgitation is not visualized. No aortic stenosis is present. ? ?Comparison(s): A prior study was performed on 01/17/2015. No significant change from prior study. ? ?FINDINGS ? Left Ventricle: Left ventricular ejection fraction, by estimation, is 60 to 65%. The left ventricle has normal function. The left ventricle has no regional wall motion abnormalities. The left ventricular internal cavity size was  small. There is no left  ?ventricular hypertrophy. Left ventricular diastolic parameters are consistent with Grade I diastolic dysfunction (impaired relaxation). ? ?Right Ventricle: The right ventricular size is normal. No increase in right ventricular wall thickness. Right ventricular systolic function is normal. Tricuspid regurgitation signal is inadequate for assessing PA pressure. ? ?Left Atrium: Left atrial size was normal in size. ? ?Right Atrium: Right atrial size was normal in size. ? ?Pericardium: There is no evidence of pericardial effusion. ? ?Mitral Valve: The mitral valve is normal in structure. No evidence of mitral valve regurgitation. No evidence of mitral valve stenosis. ? ?Tricuspid Valve: The tricuspid valve is normal in structure. Tricuspid valve regurgitation is not demonstrated. ? ?Aortic Valve: The aortic valve is tricuspid. Aortic valve regurgitation is not visualized. No aortic stenosis is present. ? ?Pulmonic Valve: The pulmonic valve was not well visualized. Pulmonic valve regurgitation is not visualized. No evidence of pulmonic stenosis. ? ?Aorta: The aortic root is normal in size and structure. ? ?IAS/Shunts: The atrial septum is grossly normal. ? ?  ?LEFT VENTRICLE ?PLAX 2D ?LVIDd:         3.50 cm   Diastology ?LVIDs:         2.50 cm   LV e' medial:    10.30 cm/s ?LV PW:         0.80 cm   LV E/e' medial:  6.7 ?LV IVS:        1.00 cm   LV e' lateral:   11.00 cm/s ?LVOT  diam:     2.35 cm   LV E/e' lateral: 6.3 ?LV SV:         116 ?LV SV Index:   63 ?LVOT Area:     4.34 cm? ?  ? ?RIGHT VENTRICLE ?RV Basal diam:  2.90 cm ?RV S prime:     11.90 cm/s ?TAPSE (M-mode): 1.8 cm ? ?LEFT

## 2021-06-26 NOTE — Assessment & Plan Note (Signed)
Stable, normal lung sounds.  Continue albuterol as needed. ?

## 2021-06-26 NOTE — Telephone Encounter (Signed)
Pt seen in office on 06/26/21 and clearance form filled out. ?

## 2021-06-27 ENCOUNTER — Encounter (HOSPITAL_BASED_OUTPATIENT_CLINIC_OR_DEPARTMENT_OTHER)
Admission: RE | Disposition: A | Discharge status: Home / Self Care | Payer: Self-pay | Source: Home / Self Care | Attending: Orthopedic Surgery

## 2021-06-27 ENCOUNTER — Encounter (HOSPITAL_BASED_OUTPATIENT_CLINIC_OR_DEPARTMENT_OTHER): Payer: Self-pay | Admitting: Orthopedic Surgery

## 2021-06-27 ENCOUNTER — Ambulatory Visit (HOSPITAL_BASED_OUTPATIENT_CLINIC_OR_DEPARTMENT_OTHER): Payer: PPO | Admitting: Anesthesiology

## 2021-06-27 ENCOUNTER — Ambulatory Visit (HOSPITAL_BASED_OUTPATIENT_CLINIC_OR_DEPARTMENT_OTHER)
Admission: RE | Admit: 2021-06-27 | Discharge: 2021-06-27 | Disposition: A | Discharge status: Home / Self Care | Payer: PPO | Attending: Orthopedic Surgery | Admitting: Orthopedic Surgery

## 2021-06-27 DIAGNOSIS — Z833 Family history of diabetes mellitus: Secondary | ICD-10-CM | POA: Insufficient documentation

## 2021-06-27 DIAGNOSIS — J449 Chronic obstructive pulmonary disease, unspecified: Secondary | ICD-10-CM | POA: Insufficient documentation

## 2021-06-27 DIAGNOSIS — Z8673 Personal history of transient ischemic attack (TIA), and cerebral infarction without residual deficits: Secondary | ICD-10-CM | POA: Diagnosis not present

## 2021-06-27 DIAGNOSIS — G5602 Carpal tunnel syndrome, left upper limb: Secondary | ICD-10-CM | POA: Insufficient documentation

## 2021-06-27 DIAGNOSIS — Z87891 Personal history of nicotine dependence: Secondary | ICD-10-CM | POA: Diagnosis not present

## 2021-06-27 DIAGNOSIS — I2510 Atherosclerotic heart disease of native coronary artery without angina pectoris: Secondary | ICD-10-CM | POA: Insufficient documentation

## 2021-06-27 DIAGNOSIS — M1990 Unspecified osteoarthritis, unspecified site: Secondary | ICD-10-CM | POA: Diagnosis not present

## 2021-06-27 DIAGNOSIS — Z8781 Personal history of (healed) traumatic fracture: Secondary | ICD-10-CM | POA: Insufficient documentation

## 2021-06-27 HISTORY — DX: Carpal tunnel syndrome, unspecified upper limb: G56.00

## 2021-06-27 HISTORY — DX: Chronic obstructive pulmonary disease, unspecified: J44.9

## 2021-06-27 HISTORY — PX: CARPAL TUNNEL RELEASE: SHX101

## 2021-06-27 SURGERY — CARPAL TUNNEL RELEASE
Anesthesia: Regional | Site: Wrist | Laterality: Left

## 2021-06-27 MED ORDER — ACETAMINOPHEN 500 MG PO TABS
1000.0000 mg | ORAL_TABLET | Freq: Once | ORAL | Status: AC
  Administered 2021-06-27: 1000 mg via ORAL

## 2021-06-27 MED ORDER — BUPIVACAINE HCL (PF) 0.25 % IJ SOLN
INTRAMUSCULAR | Status: DC | PRN
  Administered 2021-06-27: 9 mL

## 2021-06-27 MED ORDER — LACTATED RINGERS IV SOLN: INTRAVENOUS | Status: DC

## 2021-06-27 MED ORDER — ATROPINE SULFATE 0.4 MG/ML IV SOLN
INTRAVENOUS | Status: AC
Start: 2021-06-27 — End: ?
  Filled 2021-06-27: qty 1

## 2021-06-27 MED ORDER — LIDOCAINE HCL (PF) 0.5 % IJ SOLN
INTRAMUSCULAR | Status: DC | PRN
  Administered 2021-06-27: 30 mL via INTRAVENOUS

## 2021-06-27 MED ORDER — PROPOFOL 500 MG/50ML IV EMUL
INTRAVENOUS | Status: DC | PRN
  Administered 2021-06-27: 75 ug/kg/min via INTRAVENOUS

## 2021-06-27 MED ORDER — CEFAZOLIN SODIUM-DEXTROSE 2-4 GM/100ML-% IV SOLN
2.0000 g | INTRAVENOUS | Status: AC
Start: 2021-06-27 — End: 2021-06-27
  Administered 2021-06-27: 2 g via INTRAVENOUS

## 2021-06-27 MED ORDER — ONDANSETRON HCL 4 MG/2ML IJ SOLN
INTRAMUSCULAR | Status: AC
  Filled 2021-06-27: qty 2

## 2021-06-27 MED ORDER — PHENYLEPHRINE 40 MCG/ML (10ML) SYRINGE FOR IV PUSH (FOR BLOOD PRESSURE SUPPORT)
PREFILLED_SYRINGE | INTRAVENOUS | Status: AC
  Filled 2021-06-27: qty 10

## 2021-06-27 MED ORDER — EPHEDRINE 5 MG/ML INJ
INTRAVENOUS | Status: AC
  Filled 2021-06-27: qty 5

## 2021-06-27 MED ORDER — SUCCINYLCHOLINE CHLORIDE 200 MG/10ML IV SOSY
PREFILLED_SYRINGE | INTRAVENOUS | Status: AC
  Filled 2021-06-27: qty 10

## 2021-06-27 MED ORDER — FENTANYL CITRATE (PF) 100 MCG/2ML IJ SOLN
INTRAMUSCULAR | Status: DC | PRN
  Administered 2021-06-27: 50 ug via INTRAVENOUS

## 2021-06-27 MED ORDER — MIDAZOLAM HCL 5 MG/5ML IJ SOLN
INTRAMUSCULAR | Status: DC | PRN
  Administered 2021-06-27: 1 mg via INTRAVENOUS

## 2021-06-27 MED ORDER — ONDANSETRON HCL 4 MG/2ML IJ SOLN: 4.0000 mg | Freq: Once | INTRAMUSCULAR | Status: DC | PRN

## 2021-06-27 MED ORDER — LIDOCAINE 2% (20 MG/ML) 5 ML SYRINGE
INTRAMUSCULAR | Status: AC
Start: 2021-06-27 — End: ?
  Filled 2021-06-27: qty 5

## 2021-06-27 MED ORDER — CEFAZOLIN SODIUM-DEXTROSE 2-4 GM/100ML-% IV SOLN
INTRAVENOUS | Status: AC
  Filled 2021-06-27: qty 100

## 2021-06-27 MED ORDER — FENTANYL CITRATE (PF) 100 MCG/2ML IJ SOLN: 25.0000 ug | INTRAMUSCULAR | Status: DC | PRN

## 2021-06-27 MED ORDER — ONDANSETRON HCL 4 MG/2ML IJ SOLN
INTRAMUSCULAR | Status: DC | PRN
  Administered 2021-06-27: 4 mg via INTRAVENOUS

## 2021-06-27 MED ORDER — TRAMADOL HCL 50 MG PO TABS: 50.0000 mg | ORAL_TABLET | Freq: Four times a day (QID) | ORAL | 0 refills | Status: DC | PRN

## 2021-06-27 MED ORDER — AMISULPRIDE (ANTIEMETIC) 5 MG/2ML IV SOLN: 10.0000 mg | Freq: Once | INTRAVENOUS | Status: DC | PRN

## 2021-06-27 MED ORDER — MIDAZOLAM HCL 2 MG/2ML IJ SOLN
INTRAMUSCULAR | Status: AC
  Filled 2021-06-27: qty 2

## 2021-06-27 MED ORDER — ACETAMINOPHEN 500 MG PO TABS
ORAL_TABLET | ORAL | Status: AC
  Filled 2021-06-27: qty 2

## 2021-06-27 MED ORDER — 0.9 % SODIUM CHLORIDE (POUR BTL) OPTIME
TOPICAL | Status: DC | PRN
  Administered 2021-06-27: 60 mL

## 2021-06-27 MED ORDER — FENTANYL CITRATE (PF) 100 MCG/2ML IJ SOLN
INTRAMUSCULAR | Status: AC
Start: 2021-06-27 — End: ?
  Filled 2021-06-27: qty 2

## 2021-06-27 SURGICAL SUPPLY — 32 items
APL PRP STRL LF DISP 70% ISPRP (MISCELLANEOUS) ×1
BLADE SURG 15 STRL LF DISP TIS (BLADE) ×2 IMPLANT
BLADE SURG 15 STRL SS (BLADE) ×2
BNDG CMPR 5X3 CHSV STRCH STRL (GAUZE/BANDAGES/DRESSINGS) ×1
BNDG CMPR 9X4 STRL LF SNTH (GAUZE/BANDAGES/DRESSINGS) ×1
BNDG COHESIVE 3X5 TAN ST LF (GAUZE/BANDAGES/DRESSINGS) ×3 IMPLANT
BNDG ESMARK 4X9 LF (GAUZE/BANDAGES/DRESSINGS) ×1 IMPLANT
BNDG GAUZE ELAST 4 BULKY (GAUZE/BANDAGES/DRESSINGS) ×3 IMPLANT
CHLORAPREP W/TINT 26 (MISCELLANEOUS) ×3 IMPLANT
CORD BIPOLAR FORCEPS 12FT (ELECTRODE) ×3 IMPLANT
COVER BACK TABLE 60X90IN (DRAPES) ×3 IMPLANT
COVER MAYO STAND STRL (DRAPES) ×3 IMPLANT
DRAPE EXTREMITY T 121X128X90 (DISPOSABLE) ×3 IMPLANT
DRAPE U-SHAPE 47X51 STRL (DRAPES) ×1 IMPLANT
DRSG PAD ABDOMINAL 8X10 ST (GAUZE/BANDAGES/DRESSINGS) ×3 IMPLANT
GAUZE SPONGE 4X4 12PLY STRL (GAUZE/BANDAGES/DRESSINGS) ×3 IMPLANT
GAUZE XEROFORM 1X8 LF (GAUZE/BANDAGES/DRESSINGS) ×3 IMPLANT
GLOVE SURG ORTHO LTX SZ8 (GLOVE) ×3 IMPLANT
GLOVE SURG UNDER POLY LF SZ8.5 (GLOVE) ×3 IMPLANT
GOWN STRL REUS W/ TWL LRG LVL3 (GOWN DISPOSABLE) ×2 IMPLANT
GOWN STRL REUS W/TWL LRG LVL3 (GOWN DISPOSABLE) ×2
GOWN STRL REUS W/TWL XL LVL3 (GOWN DISPOSABLE) ×3 IMPLANT
NDL HYPO 27GX1-1/4 (NEEDLE) IMPLANT
NEEDLE HYPO 27GX1-1/4 (NEEDLE) ×2 IMPLANT
NS IRRIG 1000ML POUR BTL (IV SOLUTION) ×3 IMPLANT
PACK BASIN DAY SURGERY FS (CUSTOM PROCEDURE TRAY) ×3 IMPLANT
STOCKINETTE 4X48 STRL (DRAPES) ×3 IMPLANT
SUT ETHILON 4 0 PS 2 18 (SUTURE) ×3 IMPLANT
SYR BULB EAR ULCER 3OZ GRN STR (SYRINGE) ×3 IMPLANT
SYR CONTROL 10ML LL (SYRINGE) ×1 IMPLANT
TOWEL GREEN STERILE FF (TOWEL DISPOSABLE) ×3 IMPLANT
UNDERPAD 30X36 HEAVY ABSORB (UNDERPADS AND DIAPERS) ×3 IMPLANT

## 2021-06-27 NOTE — Op Note (Signed)
NAME: Cody Willis ?MEDICAL RECORD NO: [id number] ?DATE OF BIRTH: 1940-04-11 ?FACILITY: [facility] ?LOCATION: [facility] ?PHYSICIAN: Wynonia Sours, MD ?  ?OPERATIVE REPORT ?  ?DATE OF PROCEDURE: 06/27/21  ?  ?PREOPERATIVE DIAGNOSIS: Carpal tunnel syndrome left ?  ?POSTOPERATIVE DIAGNOSIS: Same ?  ?PROCEDURE: Decompression median nerve left ?  ?SURGEON: Daryll Brod, M.D. ?  ?ASSISTANT: none ?  ?ANESTHESIA:  Bier block with sedation and Local ?  ?INTRAVENOUS FLUIDS:  Per anesthesia flow sheet. ?  ?ESTIMATED BLOOD LOSS:  Minimal. ?  ?COMPLICATIONS:  None. ?  ?SPECIMENS:  none ?  ?TOURNIQUET TIME:   ? ?Total Tourniquet Time Documented: ?Forearm (Left) - 21 minutes ?Total: Forearm (Left) - 21 minutes ? ?  ?DISPOSITION:  Stable to PACU. ?  ?INDICATIONS: Patient is a an 81 year old male with a history of numbness and tingling bilateral hands nerve conductions are positive.  He is elected undergo surgical decompression of the median nerve left side.  Preperi-and postoperative course been discussed along with risk and complications.  He is aware that there is no guarantee to the surgery the possibility of infection recurrence injury to arteries nerves tendons incomplete relief of symptoms dystrophy.  In the preoperative area the patient is seen the extremity marked by both patient and surgeon antibiotic ? ?OPERATIVE COURSE: Patient brought the operating room placed in the supine position with the left arm free.  A forearm IV regional anesthetic was carried out without difficulty under the direction the anesthesia department.  He was prepped with ChloraPrep.  A 3-minute dry time was allowed and timeout taken confirm patient procedure.  A longitudinal incision was made in the left palm carried down through subcutaneous tissue.  Bleeders were electrocauterized with bipolar.  The palmar fascia was split.  The superficial palmar arch was identified along with the flexor tendon to the ring and small finger.   Retractors were placed retracting median nerve flexor tendons radially ulnar nerve ulnarly and the flexor retinaculum was then released on its ulnar border.  A sect see and sewell retractors were placed between skin and forearm fascia and the deep structures dissected with blunt dissection free and the proximal flexor retinaculum distal forearm fascia was then released for approximately 2 to 3 cm proximal to the wrist crease under direct vision.  The canal was explored.  Area compression of the nerve was apparent.  Motor branch entered muscle distally.  The wound was copiously irrigated with saline.  The skin was closed interrupted 4-0 nylon sutures.  Local infiltration of quarter percent bupivacaine without epinephrine 9 cc was used.  A sterile compressive dressing with the fingers free was applied.  Deflation of the tourniquet all fingers immediately pink.  He was taken to the recovery room for observation in satisfactory condition.  He will be discharged home to return to the hand center of [facility] on Tylenol ibuprofen for pain with Ultram for breakthrough. ? ? ?Daryll Brod, MD ?Electronically signed, 06/27/21 ? ?

## 2021-06-27 NOTE — Discharge Instructions (Addendum)
Hand Center Instructions ?Hand Surgery ? ?Wound Care: ?Keep your hand elevated above the level of your heart.  Do not allow it to dangle by your side.  Keep the dressing dry and do not remove it unless your doctor advises you to do so.  He will usually change it at the time of your post-op visit.  Moving your fingers is advised to stimulate circulation but will depend on the site of your surgery.  If you have a splint applied, your doctor will advise you regarding movement. ? ?Activity: ?Do not drive or operate machinery today.  Rest today and then you may return to your normal activity and work as indicated by your physician. ? ?Diet:  ?Drink liquids today or eat a light diet.  You may resume a regular diet tomorrow.   ? ?General expectations: ?Pain for two to three days. ?Fingers may become slightly swollen. ? ?Call your doctor if any of the following occur: ?Severe pain not relieved by pain medication. ?Elevated temperature. ?Dressing soaked with blood. ?Inability to move fingers. ?White or bluish color to fingers.  ? ? ?No tylenol until 2pm ? ? ?Post Anesthesia Home Care Instructions ? ?Activity: ?Get plenty of rest for the remainder of the day. A responsible individual must stay with you for 24 hours following the procedure.  ?For the next 24 hours, DO NOT: ?-Drive a car ?-Paediatric nurse ?-Drink alcoholic beverages ?-Take any medication unless instructed by your physician ?-Make any legal decisions or sign important papers. ? ?Meals: ?Start with liquid foods such as gelatin or soup. Progress to regular foods as tolerated. Avoid greasy, spicy, heavy foods. If nausea and/or vomiting occur, drink only clear liquids until the nausea and/or vomiting subsides. Call your physician if vomiting continues. ? ?Special Instructions/Symptoms: ?Your throat may feel dry or sore from the anesthesia or the breathing tube placed in your throat during surgery. If this causes discomfort, gargle with warm salt water. The  discomfort should disappear within 24 hours. ? ?If you had a scopolamine patch placed behind your ear for the management of post- operative nausea and/or vomiting: ? ?1. The medication in the patch is effective for 72 hours, after which it should be removed.  Wrap patch in a tissue and discard in the trash. Wash hands thoroughly with soap and water. ?2. You may remove the patch earlier than 72 hours if you experience unpleasant side effects which may include dry mouth, dizziness or visual disturbances. ?3. Avoid touching the patch. Wash your hands with soap and water after contact with the patch. ?    ?

## 2021-06-27 NOTE — Transfer of Care (Signed)
Immediate Anesthesia Transfer of Care Note ? ?Patient: Cody Willis ? ?Procedure(s) Performed: CARPAL TUNNEL RELEASE LEFT (Left: Wrist) ? ?Patient Location: PACU ? ?Anesthesia Type:MAC and Bier block ? ?Level of Consciousness: awake, alert  and oriented ? ?Airway & Oxygen Therapy: Patient Spontanous Breathing and Patient connected to face mask oxygen ? ?Post-op Assessment: Report given to RN and Post -op Vital signs reviewed and stable ? ?Post vital signs: Reviewed and stable ? ?Last Vitals:  ?Vitals Value Taken Time  ?BP    ?Temp    ?Pulse 60 06/27/21 1011  ?Resp 13 06/27/21 1011  ?SpO2 99 % 06/27/21 1011  ?Vitals shown include unvalidated device data. ? ?Last Pain:  ?Vitals:  ? 06/27/21 0750  ?TempSrc: Oral  ?   ? ?Patients Stated Pain Goal: 3 (06/27/21 0750) ? ?Complications: No notable events documented. ?

## 2021-06-27 NOTE — Anesthesia Preprocedure Evaluation (Addendum)
Anesthesia Evaluation  ?Patient identified by MRN, date of birth, ID band ?Patient awake ? ? ? ?Reviewed: ?Allergy & Precautions, NPO status , Patient's Chart, lab work & pertinent test results ? ?Airway ?Mallampati: II ? ?TM Distance: >3 FB ?Neck ROM: Full ? ? ? Dental ? ?(+) Chipped,  ?  ?Pulmonary ?COPD,  COPD inhaler, former smoker,  ?  ?Pulmonary exam normal ? ? ? ? ? ? ? Cardiovascular ?+ CAD  ?Normal cardiovascular exam ? ?ECHO: ?Left ventricular ejection fraction, by estimation, is 60 to 65%. The left ventricle has normal function. The left ventricle has no regional wall motion abnormalities. Left ?ventricular diastolic parameters are consistent with Grade I diastolic dysfunction (impaired relaxation). ?Right ventricular systolic function is normal. The right ventricular size is normal. ?Tricuspid regurgitation signal is inadequate for assessing PA pressure. ?The mitral valve is normal in structure. No evidence of mitral valve regurgitation. No evidence of mitral stenosis. ?The aortic valve is tricuspid. Aortic valve regurgitation is not visualized. No aortic stenosis is present. ?  ?Neuro/Psych ?CVA, No Residual Symptoms negative psych ROS  ? GI/Hepatic ?Neg liver ROS, hiatal hernia, GERD  Controlled and Medicated,  ?Endo/Other  ?negative endocrine ROS ? Renal/GU ?negative Renal ROS  ? ?  ?Musculoskeletal ? ?(+) Arthritis ,  ? Abdominal ?  ?Peds ? Hematology ?negative hematology ROS ?(+)   ?Anesthesia Other Findings ?LEFT CARPAL TUNNEL SYNDROME ? Reproductive/Obstetrics ? ?  ? ? ? ? ? ? ? ? ? ? ? ? ? ?  ?  ? ? ? ? ? ? ? ?Anesthesia Physical ?Anesthesia Plan ? ?ASA: 3 ? ?Anesthesia Plan: Consulting civil engineer Block-LIDOCAINE ONLY  ? ?Post-op Pain Management:   ? ?Induction: Intravenous ? ?PONV Risk Score and Plan: 1 and Ondansetron, Dexamethasone, Propofol infusion and Treatment may vary due to age or medical condition ? ?Airway Management Planned: Simple Face  Mask ? ?Additional Equipment:  ? ?Intra-op Plan:  ? ?Post-operative Plan:  ? ?Informed Consent: I have reviewed the patients History and Physical, chart, labs and discussed the procedure including the risks, benefits and alternatives for the proposed anesthesia with the patient or authorized representative who has indicated his/her understanding and acceptance.  ? ? ? ?Dental advisory given ? ?Plan Discussed with: CRNA ? ?Anesthesia Plan Comments:   ? ? ? ? ? ?Anesthesia Quick Evaluation ? ?

## 2021-06-27 NOTE — Brief Op Note (Signed)
06/27/2021 ? ?10:11 AM ? ?PATIENT:  Cody Willis  82 y.o. male ? ?PRE-OPERATIVE DIAGNOSIS:  LEFT CARPAL TUNNEL SYNDROME ? ?POST-OPERATIVE DIAGNOSIS:  LEFT CARPAL TUNNEL SYNDROME ? ?PROCEDURE:  Procedure(s) with comments: ?CARPAL TUNNEL RELEASE LEFT (Left) - 45 MIN ? ?SURGEON:  Surgeon(s) and Role: ?   Daryll Brod, MD - Primary ? ?PHYSICIAN ASSISTANT:  ? ?ASSISTANTS: none  ? ?ANESTHESIA:   local, regional, and IV sedation ? ?EBL:  5 mL  ? ?BLOOD ADMINISTERED:none ? ?DRAINS: none  ? ?LOCAL MEDICATIONS USED:  BUPIVICAINE  ? ?SPECIMEN:  No Specimen ? ?DISPOSITION OF SPECIMEN:  N/A ? ?COUNTS:  YES ? ?TOURNIQUET:   ?Total Tourniquet Time Documented: ?Forearm (Left) - 21 minutes ?Total: Forearm (Left) - 21 minutes ? ? ?DICTATION: .Dragon Dictation ? ?PLAN OF CARE: Discharge to home after PACU ? ?PATIENT DISPOSITION:  PACU - hemodynamically stable. ?  ? ? ?

## 2021-06-27 NOTE — Anesthesia Postprocedure Evaluation (Signed)
Anesthesia Post Note ? ?Patient: Cody Willis ? ?Procedure(s) Performed: CARPAL TUNNEL RELEASE LEFT (Left: Wrist) ? ?  ? ?Patient location during evaluation: PACU ?Anesthesia Type: Bier Block ?Level of consciousness: awake ?Pain management: pain level controlled ?Vital Signs Assessment: post-procedure vital signs reviewed and stable ?Respiratory status: spontaneous breathing, nonlabored ventilation, respiratory function stable and patient connected to nasal cannula oxygen ?Cardiovascular status: stable and blood pressure returned to baseline ?Postop Assessment: no apparent nausea or vomiting ?Anesthetic complications: no ? ? ?No notable events documented. ? ?Last Vitals:  ?Vitals:  ? 06/27/21 1030 06/27/21 1051  ?BP: (!) 142/68 (!) 139/59  ?Pulse: (!) 55 67  ?Resp: 12 18  ?Temp:  36.4 ?C  ?SpO2: 95% 96%  ?  ?Last Pain:  ?Vitals:  ? 06/27/21 1051  ?TempSrc: Oral  ?PainSc: 0-No pain  ? ? ?  ?  ?  ?  ?  ?  ? ?Blakleigh Straw P Ryosuke Ericksen ? ? ? ? ?

## 2021-06-27 NOTE — Anesthesia Procedure Notes (Signed)
Anesthesia Regional Block: Bier block (IV Regional)   Pre-Anesthetic Checklist: , timeout performed,  Correct Patient, Correct Site, Correct Laterality,  Correct Procedure,, site marked,  Surgical consent,  At surgeon's request  Laterality: Left         Needles:  Injection technique: Single-shot  Needle Type: Other      Needle Gauge: 20     Additional Needles:   Procedures:,,,,, intact distal pulses, Esmarch exsanguination,  Single tourniquet utilized    Narrative:   Performed by: Personally      

## 2021-06-27 NOTE — H&P (Signed)
?Cody Willis is an 81 y.o. male.   ?Chief Complaint: numbness left hand ?HPI: [name] is a 81 year old left-hand-dominant male who comes in complaining of left hand pain numbness and tingling greater than right. He is complaining of relatively constant numbness on his left hand thumb through middle fingers occasionally the whole hand this been going on for a little over a year he has occasional symptoms on his right side. He has a prior history of a scaphoid fracture of his right wrist treated with a headless screw by Dr. Percell Miller least 20 years ago. He has no history of injury to his neck. He is awake and 7 out of 7 nights. He states increasing activities seems to bother his hand. He has been taking Aleve occasionally for this. He has not had any other treatment. He has no history of diabetes thyroid problems arthritis or gout. Family history is positive for diabetes he has been tested. ?He was referred for nerve conductions with Dr. Tamsen Roers. Have been done revealing a motor delay of 6 3 and 6 2 left and right with a sensory delay of 6 1 on the left for 3 on the right. Ultrasound reveals enlargement of the nerves to 20 on the right and 16 on the left. He was provided with splints to wear along with meloxicam. He states that the splinting meloxicam have not given him any significant relief of symptoms.  ?Past Medical History:  ?Diagnosis Date  ? Arthritis   ? fingers  ? Basal cell carcinoma 02/10/2008  ? left top of shoulder  ? Carpal tunnel syndrome   ? Cataract   ? bilateral surgery repair  ? Chronic pancreatitis ([place])   ? COPD (chronic obstructive pulmonary disease) ([place])   ? COVID-19   ? Esophageal reflux   ? Gastric polyps   ? hyperplastic  ? Herpes zoster   ? Hiatal hernia   ? Other B-complex deficiencies   ? Pure hypercholesterolemia   ? Stroke ([place]) 01/16/2015  ? only on ASA  ? ? ?Past Surgical History:  ?Procedure Laterality Date  ? APPENDECTOMY    ? BACK SURGERY    ? Diskectomy between L4 and L5  ?  CATARACT EXTRACTION W/ INTRAOCULAR LENS  IMPLANT, BILATERAL  12/15 and 2/16  ? CHOLECYSTECTOMY    ? COLONOSCOPY  2011  ? MELANOMA EXCISION  ~2009  ? right upper abdomen  ? ? ?Family History  ?Problem Relation Age of Onset  ? Stroke Mother   ? Diabetes Mother   ? Emphysema Father   ?     smoker  ? Crohn's disease Sister   ? Colitis Brother   ? Colon cancer Neg Hx   ? Colon polyps Neg Hx   ? Esophageal cancer Neg Hx   ? Rectal cancer Neg Hx   ? Stomach cancer Neg Hx   ? ?Social History:  reports that he quit smoking about 6 years ago. His smoking use included cigarettes. He has a 50.00 pack-year smoking history. He has never used smokeless tobacco. He reports that he does not drink alcohol and does not use drugs. ? ?Allergies:  ?Allergies  ?Allergen Reactions  ? Meloxicam   ?  "Tears my stomach up"  ? Plavix [Clopidogrel Bisulfate] Itching  ? Sulfamethoxazole-Trimethoprim Rash  ? ? ?No medications prior to admission.  ? ? ?Results for orders placed or performed in visit on 06/26/21 (from the past 48 hour(s))  ?Comprehensive metabolic panel     Status: Abnormal  ?  Collection Time: 06/26/21  9:24 AM  ?Result Value Ref Range  ? Sodium 141 135 - 145 mEq/L  ? Potassium 4.7 3.5 - 5.1 mEq/L  ? Chloride 104 96 - 112 mEq/L  ? CO2 32 19 - 32 mEq/L  ? Glucose, Bld 101 (H) 70 - 99 mg/dL  ? BUN 22 6 - 23 mg/dL  ? Creatinine, Ser 1.06 0.40 - 1.50 mg/dL  ? Total Bilirubin 0.9 0.2 - 1.2 mg/dL  ? Alkaline Phosphatase 52 39 - 117 U/L  ? AST 19 0 - 37 U/L  ? ALT 15 0 - 53 U/L  ? Total Protein 6.6 6.0 - 8.3 g/dL  ? Albumin 4.6 3.5 - 5.2 g/dL  ? GFR 66.49 >60.00 mL/min  ?  Comment: Calculated using the CKD-EPI Creatinine Equation (2021)  ? Calcium 9.3 8.4 - 10.5 mg/dL  ?Lipid panel     Status: None  ? Collection Time: 06/26/21  9:24 AM  ?Result Value Ref Range  ? Cholesterol 114 0 - 200 mg/dL  ?  Comment: ATP III Classification       Desirable:  < 200 mg/dL               Borderline High:  200 - 239 mg/dL          High:  > = 240 mg/dL  ?  Triglycerides 107.0 0.0 - 149.0 mg/dL  ?  Comment: Normal:  <150 mg/dLBorderline High:  150 - 199 mg/dL  ? HDL 45.50 >39.00 mg/dL  ? VLDL 21.4 0.0 - 40.0 mg/dL  ? LDL Cholesterol 47 0 - 99 mg/dL  ? Total CHOL/HDL Ratio 3   ?  Comment:                Men          Women1/2 Average Risk     3.4          3.3Average Risk          5.0          4.42X Average Risk          9.6          7.13X Average Risk          15.0          11.0                      ? NonHDL 68.59   ?  Comment: NOTE:  Non-HDL goal should be 30 mg/dL higher than patient's LDL goal (i.e. LDL goal of < 70 mg/dL, would have non-HDL goal of < 100 mg/dL)  ?CBC     Status: None  ? Collection Time: 06/26/21  9:24 AM  ?Result Value Ref Range  ? WBC 8.9 4.0 - 10.5 K/uL  ? RBC 4.79 4.22 - 5.81 Mil/uL  ? Platelets 155.0 150.0 - 400.0 K/uL  ? Hemoglobin 14.6 13.0 - 17.0 g/dL  ? HCT 42.8 39.0 - 52.0 %  ? MCV 89.4 78.0 - 100.0 fl  ? MCHC 34.2 30.0 - 36.0 g/dL  ? RDW 14.7 11.5 - 15.5 %  ? ? ?No results found. ? ? ?Pertinent items are noted in HPI. ? ?Height '5\' 6"'$  (1.676 m), weight 76.7 kg. ? ?General appearance: alert, cooperative, and appears stated age ?Head: Normocephalic, without obvious abnormality ?Neck: no JVD ?Resp: clear to auscultation bilaterally ?Cardio: regular rate and rhythm, S1, S2 normal, no murmur, click, rub or gallop ?GI: soft, non-tender; bowel  sounds normal; no masses,  no organomegaly ?Extremities: numbness left hand ?Pulses: 2+ and symmetric ?Skin: Skin color, texture, turgor normal. No rashes or lesions ?Neurologic: Grossly normal ?Incision/Wound: ?na ? ?Assessment/Plan ?Diagnosis bilateral carpal tunnel syndrome ?Plan: He would like to proceed to have the left side surgically released. Prepare and postoperative course are discussed along with risk and complications. He is aware that there is no guarantee to the surgery the possibility of infection recurrence injury to arteries nerves tendons complete relief symptoms dystrophy. He is scheduled  for left carpal tunnel release in outpatient under regional anesthesia.  ?Daryll Brod ?06/27/2021, 5:38 AM ? ?  ?

## 2021-06-28 ENCOUNTER — Encounter (HOSPITAL_BASED_OUTPATIENT_CLINIC_OR_DEPARTMENT_OTHER): Payer: Self-pay | Admitting: Orthopedic Surgery

## 2021-06-28 NOTE — Progress Notes (Signed)
Left message stating courtesy call and if any questions or concerns please call the doctors office.  

## 2021-06-29 DIAGNOSIS — H353210 Exudative age-related macular degeneration, right eye, stage unspecified: Secondary | ICD-10-CM | POA: Diagnosis not present

## 2021-07-27 ENCOUNTER — Ambulatory Visit: Payer: PPO | Admitting: Dermatology

## 2021-07-27 DIAGNOSIS — L821 Other seborrheic keratosis: Secondary | ICD-10-CM

## 2021-07-27 DIAGNOSIS — D229 Melanocytic nevi, unspecified: Secondary | ICD-10-CM

## 2021-07-27 DIAGNOSIS — Z85828 Personal history of other malignant neoplasm of skin: Secondary | ICD-10-CM | POA: Diagnosis not present

## 2021-07-27 DIAGNOSIS — L820 Inflamed seborrheic keratosis: Secondary | ICD-10-CM | POA: Diagnosis not present

## 2021-07-27 DIAGNOSIS — D1800 Hemangioma unspecified site: Secondary | ICD-10-CM | POA: Diagnosis not present

## 2021-07-27 DIAGNOSIS — D692 Other nonthrombocytopenic purpura: Secondary | ICD-10-CM | POA: Diagnosis not present

## 2021-07-27 DIAGNOSIS — L700 Acne vulgaris: Secondary | ICD-10-CM

## 2021-07-27 DIAGNOSIS — L219 Seborrheic dermatitis, unspecified: Secondary | ICD-10-CM | POA: Diagnosis not present

## 2021-07-27 DIAGNOSIS — L814 Other melanin hyperpigmentation: Secondary | ICD-10-CM | POA: Diagnosis not present

## 2021-07-27 DIAGNOSIS — L578 Other skin changes due to chronic exposure to nonionizing radiation: Secondary | ICD-10-CM | POA: Diagnosis not present

## 2021-07-27 DIAGNOSIS — L570 Actinic keratosis: Secondary | ICD-10-CM

## 2021-07-27 DIAGNOSIS — Z1283 Encounter for screening for malignant neoplasm of skin: Secondary | ICD-10-CM | POA: Diagnosis not present

## 2021-07-27 MED ORDER — KETOCONAZOLE 2 % EX SHAM: MEDICATED_SHAMPOO | CUTANEOUS | 6 refills | Status: DC

## 2021-07-27 NOTE — Progress Notes (Signed)
? ?Follow-Up Visit ?  ?Subjective  ?Cody Willis is a 81 y.o. male who presents for the following: Annual Exam (Patient hx bcc, isk, aks, hx of seb derm using ketoconazole. Would like refills. Hx of purpura. ). ?The patient presents for Total-Body Skin Exam (TBSE) for skin cancer screening and mole check.  The patient has spots, moles and lesions to be evaluated, some may be new or changing and the patient has concerns that these could be cancer. ? ?The following portions of the chart were reviewed this encounter and updated as appropriate:  Tobacco  Allergies  Meds  Problems  Med Hx  Surg Hx  Fam Hx   ?  ?Review of Systems: No other skin or systemic complaints except as noted in HPI or Assessment and Plan. ? ?Objective  ?Well appearing patient in no apparent distress; mood and affect are within normal limits. ? ?A full examination was performed including scalp, head, eyes, ears, nose, lips, neck, chest, axillae, abdomen, back, buttocks, bilateral upper extremities, bilateral lower extremities, hands, feet, fingers, toes, fingernails, and toenails. All findings within normal limits unless otherwise noted below. ? ?face x 21 , L dorsum hand x 1, (22) ?Erythematous thin papules/macules with gritty scale.  ? ?r index finger x 1, L waist x 1 (2) ?Erythematous stuck-on, waxy papule or plaque ? ? ?Assessment & Plan  ?Seborrheic dermatitis ?Scalp ?Seborrheic Dermatitis with pruritis  ?-  is a chronic persistent rash characterized by pinkness and scaling most commonly of the mid face but also can occur on the scalp (dandruff), ears; mid chest, mid back and groin.  It tends to be exacerbated by stress and cooler weather.  People who have neurologic disease may experience new onset or exacerbation of existing seborrheic dermatitis.  The condition is not curable but treatable and can be controlled.  ?  ?Continue Ketoconazole 2% shampoo apply two-three times per week, massage into scalp and leave in for 10 minutes  before rinsing out  ?Start otc dandruff shampoo apply two- three times per week, massage into scalp and leave in for 10 minutes before rinsing out  ? ?Related Medications ?ketoconazole (NIZORAL) 2 % shampoo ?apply three times per week, massage into scalp and leave in for 10 minutes before rinsing out ? ?[place] ?Head - Anterior (Face) ?Aklief samples given to use nightly's for spot treatment of blackheads and whiteheads of the face ? ?Actinic keratosis (22) ?face x 21 , L dorsum hand x 1, ?Actinic keratoses are precancerous spots that appear secondary to cumulative UV radiation exposure/sun exposure over time. They are chronic with expected duration over 1 year. A portion of actinic keratoses will progress to squamous cell carcinoma of the skin. It is not possible to reliably predict which spots will progress to skin cancer and so treatment is recommended to prevent development of skin cancer. ? ?Recommend daily broad spectrum sunscreen SPF 30+ to sun-exposed areas, reapply every 2 hours as needed.  ?Recommend staying in the shade or wearing long sleeves, sun glasses (UVA+UVB protection) and wide brim hats (4-inch brim around the entire circumference of the hat). ?Call for new or changing lesions. ? ?Destruction of lesion - face x 21 , L dorsum hand x 1, ?Complexity: simple   ?Destruction method: cryotherapy   ?Informed consent: discussed and consent obtained   ?Timeout:  patient name, date of birth, surgical site, and procedure verified ?Lesion destroyed using liquid nitrogen: Yes   ?Region frozen until ice ball extended beyond lesion: Yes   ?  Outcome: patient tolerated procedure well with no complications   ?Post-procedure details: wound care instructions given   ? ?Inflamed seborrheic keratosis (2) ?r index finger x 1, L waist x 1 ?Destruction of lesion - r index finger x 1, L waist x 1 ?Complexity: simple   ?Destruction method: cryotherapy   ?Informed consent: discussed and consent obtained   ?Timeout:  patient  name, date of birth, surgical site, and procedure verified ?Lesion destroyed using liquid nitrogen: Yes   ?Region frozen until ice ball extended beyond lesion: Yes   ?Outcome: patient tolerated procedure well with no complications   ?Post-procedure details: wound care instructions given   ? ?Lentigines ?- Scattered tan macules ?- Due to sun exposure ?- Benign-appearing, observe ?- Recommend daily broad spectrum sunscreen SPF 30+ to sun-exposed areas, reapply every 2 hours as needed. ?- Call for any changes ? ?Seborrheic Keratoses ?- Stuck-on, waxy, tan-brown papules and/or plaques  ?- Benign-appearing ?- Discussed benign etiology and prognosis. ?- Observe ?- Call for any changes ? ?Melanocytic Nevi ?- Tan-brown and/or pink-flesh-colored symmetric macules and papules ?- Benign appearing on exam today ?- Observation ?- Call clinic for new or changing moles ?- Recommend daily use of broad spectrum spf 30+ sunscreen to sun-exposed areas.  ? ?Hemangiomas ?- Red papules at left scalp ?- Discussed benign nature ?- Observe ?- Call for any changes ? ?Purpura - Chronic; persistent and recurrent.  Treatable, but not curable. ?- Violaceous macules and patches ?- Benign ?- Related to trauma, age, sun damage and/or use of blood thinners, chronic use of topical and/or oral steroids ?- Observe ?- Can use OTC arnica containing moisturizer such as Dermend Bruise Formula if desired ?- Call for worsening or other concerns ? ?Actinic Damage ?- Chronic condition, secondary to cumulative UV/sun exposure ?- diffuse scaly erythematous macules with underlying dyspigmentation ?- Recommend daily broad spectrum sunscreen SPF 30+ to sun-exposed areas, reapply every 2 hours as needed.  ?- Staying in the shade or wearing long sleeves, sun glasses (UVA+UVB protection) and wide brim hats (4-inch brim around the entire circumference of the hat) are also recommended for sun protection.  ?- Call for new or changing lesions. ? ?History of Basal Cell  Carcinoma of the Skin ?- No evidence of recurrence today at left top of shoulder 2009 ?- Recommend regular full body skin exams ?- Recommend daily broad spectrum sunscreen SPF 30+ to sun-exposed areas, reapply every 2 hours as needed.  ?- Call if any new or changing lesions are noted between office visits ? ?Skin cancer screening performed today. ?Return for 4 - 6 month ak follow up, 1 year tbse . ?Documentation: I have reviewed the above documentation for accuracy and completeness, and I agree with the above. ? ?Sarina Ser, MD ? ?

## 2021-07-27 NOTE — Patient Instructions (Signed)
?Actinic keratoses are precancerous spots that appear secondary to cumulative UV radiation exposure/sun exposure over time. They are chronic with expected duration over 1 year. A portion of actinic keratoses will progress to squamous cell carcinoma of the skin. It is not possible to reliably predict which spots will progress to skin cancer and so treatment is recommended to prevent development of skin cancer. ? ?Recommend daily broad spectrum sunscreen SPF 30+ to sun-exposed areas, reapply every 2 hours as needed.  ?Recommend staying in the shade or wearing long sleeves, sun glasses (UVA+UVB protection) and wide brim hats (4-inch brim around the entire circumference of the hat). ?Call for new or changing lesions.  ? ? ? ?Cryotherapy Aftercare ? ?Wash gently with soap and water everyday.   ?Apply Vaseline and Band-Aid daily until healed.  ? ?Seborrheic Keratosis ? ?What causes seborrheic keratoses? ?Seborrheic keratoses are harmless, common skin growths that first appear during adult life.  As time goes by, more growths appear.  Some people may develop a large number of them.  Seborrheic keratoses appear on both covered and uncovered body parts.  They are not caused by sunlight.  The tendency to develop seborrheic keratoses can be inherited.  They vary in color from skin-colored to gray, brown, or even black.  They can be either smooth or have a rough, warty surface.   ?Seborrheic keratoses are superficial and look as if they were stuck on the skin.  Under the microscope this type of keratosis looks like layers upon layers of skin.  That is why at times the top layer may seem to fall off, but the rest of the growth remains and re-grows.   ? ?Treatment ?Seborrheic keratoses do not need to be treated, but can easily be removed in the office.  Seborrheic keratoses often cause symptoms when they rub on clothing or jewelry.  Lesions can be in the way of shaving.  If they become inflamed, they can cause itching, soreness,  or burning.  Removal of a seborrheic keratosis can be accomplished by freezing, burning, or surgery. ?If any spot bleeds, scabs, or grows rapidly, please return to have it checked, as these can be an indication of a skin cancer. ? ? ? ?Melanoma ABCDEs ? ?Melanoma is the most dangerous type of skin cancer, and is the leading cause of death from skin disease.  You are more likely to develop melanoma if you: ?Have light-colored skin, light-colored eyes, or red or blond hair ?Spend a lot of time in the sun ?Tan regularly, either outdoors or in a tanning bed ?Have had blistering sunburns, especially during childhood ?Have a close family member who has had a melanoma ?Have atypical moles or large birthmarks ? ?Early detection of melanoma is key since treatment is typically straightforward and cure rates are extremely high if we catch it early.  ? ?The first sign of melanoma is often a change in a mole or a new dark spot.  The ABCDE system is a way of remembering the signs of melanoma. ? ?A for asymmetry:  The two halves do not match. ?B for border:  The edges of the growth are irregular. ?C for color:  A mixture of colors are present instead of an even brown color. ?D for diameter:  Melanomas are usually (but not always) greater than 48m - the size of a pencil eraser. ?E for evolution:  The spot keeps changing in size, shape, and color. ? ?Please check your skin once per month between visits. You can  use a small mirror in front and a large mirror behind you to keep an eye on the back side or your body.  ? ?If you see any new or changing lesions before your next follow-up, please call to schedule a visit. ? ?Please continue daily skin protection including broad spectrum sunscreen SPF 30+ to sun-exposed areas, reapplying every 2 hours as needed when you're outdoors.  ? ?Staying in the shade or wearing long sleeves, sun glasses (UVA+UVB protection) and wide brim hats (4-inch brim around the entire circumference of the hat)  are also recommended for sun protection.   ? ?If You Need Anything After Your Visit ? ?If you have any questions or concerns for your doctor, please call our main line at [phone] and press option 4 to reach your doctor's medical assistant. If no one answers, please leave a voicemail as directed and we will return your call as soon as possible. Messages left after 4 pm will be answered the following business day.  ? ?You may also send [place] a message via MyChart. We typically respond to MyChart messages within 1-2 business days. ? ?For prescription refills, please ask your pharmacy to contact our office. Our fax number is [phone]. ? ?If you have an urgent issue when the clinic is closed that cannot wait until the next business day, you can page your doctor at the number below.   ? ?Please note that while we do our best to be available for urgent issues outside of office hours, we are not available 24/7.  ? ?If you have an urgent issue and are unable to reach [place], you may choose to seek medical care at your doctor's office, retail clinic, urgent care center, or emergency room. ? ?If you have a medical emergency, please immediately call 911 or go to the emergency department. ? ?Pager Numbers ? ?- Dr. Nehemiah Massed: [phone] ? ?- Dr. Laurence Ferrari: [phone] ? ?- Dr. Nicole Kindred: [phone] ? ?In the event of inclement weather, please call our main line at [phone] for an update on the status of any delays or closures. ? ?Dermatology Medication Tips: ?Please keep the boxes that topical medications come in in order to help keep track of the instructions about where and how to use these. Pharmacies typically print the medication instructions only on the boxes and not directly on the medication tubes.  ? ?If your medication is too expensive, please contact our office at [phone] option 4 or send [place] a message through [place].  ? ?We are unable to tell what your co-pay for medications will be in advance as this is  different depending on your insurance coverage. However, we may be able to find a substitute medication at lower cost or fill out paperwork to get insurance to cover a needed medication.  ? ?If a prior authorization is required to get your medication covered by your insurance company, please allow [place] 1-2 business days to complete this process. ? ?Drug prices often vary depending on where the prescription is filled and some pharmacies may offer cheaper prices. ? ?The website www.goodrx.com contains coupons for medications through different pharmacies. The prices here do not account for what the cost may be with help from insurance (it may be cheaper with your insurance), but the website can give you the price if you did not use any insurance.  ?- You can print the associated coupon and take it with your prescription to the pharmacy.  ?- You may also stop by our office during regular  business hours and pick up a GoodRx coupon card.  ?- If you need your prescription sent electronically to a different pharmacy, notify our office through [facility] or by phone at [phone] option 4. ? ? ? ? ?Si Usted Necesita Algo Despu?s de Su Visita ? ?Tambi?n puede enviarnos un mensaje a trav?s de MyChart. Por lo general respondemos a los mensajes de MyChart en el transcurso de 1 a 2 d?as h?biles. ? ?Para renovar recetas, por favor pida a su farmacia que se ponga en contacto con nuestra oficina. Nuestro n?mero de fax es el [phone]. ? ?Si tiene un asunto urgente cuando la cl?nica est? cerrada y que no puede esperar hasta el siguiente d?a h?bil, puede llamar/localizar a su doctor(a) al n?mero que aparece a continuaci?n.  ? ?Por favor, tenga en cuenta que aunque hacemos todo lo posible para estar disponibles para asuntos urgentes fuera del horario de oficina, no estamos disponibles las 24 horas del d?a, los 7 d?as de la semana.  ? ?Si tiene un problema urgente y no puede comunicarse con nosotros, puede optar por buscar  atenci?n m?dica  en el consultorio de su doctor(a), en una cl?nica privada, en un centro de atenci?n urgente o en una sala de emergencias. ? ?Si tiene una emergencia m?dica, por favor llame inmediatamente a

## 2021-08-05 ENCOUNTER — Encounter: Payer: Self-pay | Admitting: Dermatology

## 2021-08-08 ENCOUNTER — Other Ambulatory Visit: Payer: Self-pay | Admitting: Internal Medicine

## 2021-08-19 ENCOUNTER — Ambulatory Visit (HOSPITAL_COMMUNITY)
Admission: RE | Admit: 2021-08-19 | Discharge: 2021-08-19 | Disposition: A | Discharge status: Home / Self Care | Payer: PPO | Source: Ambulatory Visit | Attending: Emergency Medicine | Admitting: Emergency Medicine

## 2021-08-19 ENCOUNTER — Encounter (HOSPITAL_COMMUNITY): Payer: Self-pay

## 2021-08-19 VITALS — BP 152/54 | HR 69 | Temp 97.7°F | Resp 18

## 2021-08-19 DIAGNOSIS — J441 Chronic obstructive pulmonary disease with (acute) exacerbation: Secondary | ICD-10-CM | POA: Diagnosis not present

## 2021-08-19 DIAGNOSIS — B349 Viral infection, unspecified: Secondary | ICD-10-CM

## 2021-08-19 MED ORDER — PREDNISONE 20 MG PO TABS: 40.0000 mg | ORAL_TABLET | Freq: Every day | ORAL | 0 refills | Status: DC

## 2021-08-19 MED ORDER — AMOXICILLIN-POT CLAVULANATE 875-125 MG PO TABS: 1.0000 | ORAL_TABLET | Freq: Two times a day (BID) | ORAL | 0 refills | Status: DC

## 2021-08-19 NOTE — ED Provider Notes (Signed)
[facility]    CSN: [id number] Arrival date & time: 08/19/21  1134      History   Chief Complaint Chief Complaint  Patient presents with   Appointment    1200   Cough    HPI Cody Willis is a 81 y.o. male.   Patient presents with a productive cough, chest congestion, nasal congestion, rhinorrhea and a generalized headache for 3 days.  Tolerating food and liquids.  No known sick contacts.  Has attempted use of Mucinex which has been minimally effective.  History of COPD, CVA, GERD and chronic pancreatitis.  Denies chest tightness or pain, shortness of breath or wheezing.  Past Medical History:  Diagnosis Date   Arthritis    fingers   Basal cell carcinoma 02/10/2008   left top of shoulder   Carpal tunnel syndrome    Cataract    bilateral surgery repair   Chronic pancreatitis (HCC)    COPD (chronic obstructive pulmonary disease) (HCC)    COVID-19    Esophageal reflux    Gastric polyps    hyperplastic   Herpes zoster    Hiatal hernia    Other B-complex deficiencies    Pure hypercholesterolemia    Stroke (HCC) 01/16/2015   only on ASA    Patient Active Problem List   Diagnosis Date Noted   Coronary artery disease involving native coronary artery of native heart without angina pectoris 02/02/2021   Dyspnea 02/02/2021   Chest pain of uncertain etiology 02/02/2021   Carotid bruit 02/02/2021   Pulmonary nodules 10/28/2020   Aortic atherosclerosis (HCC) 10/28/2020   Prediabetes 05/29/2018   Hx of melanoma excision 05/29/2018   Leucocytosis 05/29/2018   Proteinuria 04/29/2017   Former smoker 10/31/2015   Preventative health care 07/26/2015   Advance directive discussed with patient 07/26/2015   Pain in hand 07/25/2015   Occipital neuralgia of right side 05/02/2015   History of CVA (cerebrovascular accident) 03/21/2015   Essential hypertension 03/21/2015   COPD (chronic obstructive pulmonary disease) (HCC) 08/17/2014   CHRONIC PANCREATITIS  01/03/2010   B12 DEFICIENCY 05/13/2009   Mixed hyperlipidemia 07/02/2007   ACID REFLUX DISEASE 07/02/2007    Past Surgical History:  Procedure Laterality Date   APPENDECTOMY     BACK SURGERY     Diskectomy between L4 and L5   CARPAL TUNNEL RELEASE Left 06/27/2021   Procedure: CARPAL TUNNEL RELEASE LEFT;  Surgeon: Cindee Salt, MD;  Location: [facility];  Service: Orthopedics;  Laterality: Left;  Regional with monitored anesthesia care   CATARACT EXTRACTION W/ INTRAOCULAR LENS  IMPLANT, BILATERAL  12/15 and 2/16   CHOLECYSTECTOMY     COLONOSCOPY  2011   MELANOMA EXCISION  ~2009   right upper abdomen       Home Medications    Prior to Admission medications   Medication Sig Start Date End Date Taking? Authorizing Provider  albuterol (VENTOLIN HFA) 108 (90 Base) MCG/ACT inhaler Inhale 1-2 puffs into the lungs every 4 (four) hours as needed for wheezing or shortness of breath. 03/03/21  Yes Domenick Gong, MD  aspirin EC 81 MG tablet Take 1 tablet (81 mg total) by mouth daily. Swallow whole. 02/02/21  Yes Jake Bathe, MD  cyanocobalamin (,VITAMIN B-12,) 1000 MCG/ML injection INJECT INTO THE MUSCLE EVERY 30 DAYS. PHARMACY-PLEASE PROVIDE APPROPRIATE NEEDLES AND SYRINGES 02/02/21  Yes Pyrtle, Carie Caddy, MD  pantoprazole (PROTONIX) 40 MG tablet Take 1 tablet (40 mg total) by mouth every morning. 02/02/21  Yes  Pyrtle, Carie Caddy, MD  rosuvastatin (CRESTOR) 20 MG tablet Take 1 tablet (20 mg total) by mouth daily. 02/02/21  Yes Jake Bathe, MD  [name] 25000-79000 units CPEP TAKE 2 CAPSULES BY MOUTH 3 TIMES DAILY WITH MEALS 08/08/21  Yes Pyrtle, Carie Caddy, MD  ketoconazole (NIZORAL) 2 % shampoo apply three times per week, massage into scalp and leave in for 10 minutes before rinsing out 07/27/21   Deirdre Evener, MD  traMADol (ULTRAM) 50 MG tablet Take 1 tablet (50 mg total) by mouth every 6 (six) hours as needed. 06/27/21   Cindee Salt, MD  triamcinolone (KENALOG) 0.025 % ointment  daily as needed for rash. 03/27/18   [provider]    Family History Family History  Problem Relation Age of Onset   Stroke Mother    Diabetes Mother    Emphysema Father        smoker   Crohn's disease Sister    Colitis Brother    Colon cancer Neg Hx    Colon polyps Neg Hx    Esophageal cancer Neg Hx    Rectal cancer Neg Hx    Stomach cancer Neg Hx     Social History Social History   Tobacco Use   Smoking status: Former    Types: Cigarettes   Smokeless tobacco: Never  Vaping Use   Vaping Use: Never used  Substance Use Topics   Alcohol use: No    Alcohol/week: 0.0 standard drinks   Drug use: Never     Allergies   Meloxicam, Plavix [clopidogrel bisulfate], and Sulfamethoxazole-trimethoprim   Review of Systems Review of Systems  Constitutional: Negative.   HENT:  Positive for congestion and rhinorrhea. Negative for dental problem, drooling, ear discharge, ear pain, facial swelling, hearing loss, mouth sores, nosebleeds, postnasal drip, sinus pressure, sinus pain, sneezing, sore throat, tinnitus, trouble swallowing and voice change.   Respiratory:  Positive for cough. Negative for apnea, choking, chest tightness, shortness of breath, wheezing and stridor.   Cardiovascular: Negative.   Gastrointestinal: Negative.   Neurological:  Positive for headaches. Negative for dizziness, tremors, seizures, syncope, facial asymmetry, speech difficulty, weakness, light-headedness and numbness.    Physical Exam Triage Vital Signs ED Triage Vitals  Enc Vitals Group     BP 08/19/21 1155 (!) 151/49     Pulse Rate 08/19/21 1155 69     Resp 08/19/21 1155 18     Temp 08/19/21 1155 97.7 F (36.5 C)     Temp Source 08/19/21 1155 Oral     SpO2 08/19/21 1155 98 %     Weight --      Height --      Head Circumference --      Peak Flow --      Pain Score 08/19/21 1159 5     Pain Loc --      Pain Edu? --      Excl. in GC? --    No data found.  Updated Vital Signs BP  (!) 152/54 (BP Location: Left Arm)   Pulse 69   Temp 97.7 F (36.5 C) (Oral)   Resp 18   SpO2 98%   Visual Acuity Right Eye Distance:   Left Eye Distance:   Bilateral Distance:    Right Eye Near:   Left Eye Near:    Bilateral Near:     Physical Exam Constitutional:      Appearance: Normal appearance.  HENT:     Head: Normocephalic.  Right Ear: Tympanic membrane, ear canal and external ear normal.     Left Ear: Tympanic membrane, ear canal and external ear normal.     Nose: Congestion present. No rhinorrhea.     Mouth/Throat:     Mouth: Mucous membranes are moist.     Pharynx: Oropharynx is clear.  Eyes:     Extraocular Movements: Extraocular movements intact.  Cardiovascular:     Rate and Rhythm: Normal rate and regular rhythm.     Pulses: Normal pulses.     Heart sounds: Normal heart sounds.  Pulmonary:     Effort: Pulmonary effort is normal.     Breath sounds: Wheezing present.  Skin:    General: Skin is warm and dry.  Neurological:     Mental Status: He is alert and oriented to person, place, and time. Mental status is at baseline.  Psychiatric:        Mood and Affect: Mood normal.        Behavior: Behavior normal.     UC Treatments / Results  Labs (all labs ordered are listed, but only abnormal results are displayed) Labs Reviewed - No data to display  EKG   Radiology No results found.  Procedures Procedures (including critical care time)  Medications Ordered in UC Medications - No data to display  Initial Impression / Assessment and Plan / UC Course  I have reviewed the triage vital signs and the nursing notes.  Pertinent labs & imaging results that were available during my care of the patient were reviewed by me and considered in my medical decision making (see chart for details).  COPD exacerbation Viral illness  Vital signs are stable, O2 saturation 98% on room air, wheezing heard to auscultation, patient is in no signs of distress,  etiology is symptoms are most likely viral exacerbating patient's baseline COPD, discussed with patient, Augmentin 7-day course prescribed as well as prednisone 40 mg burst, may use additional over-the-counter medications for supportive care with follow-up with urgent care or PCP as needed Final Clinical Impressions(s) / UC Diagnoses   Final diagnoses:  None   Discharge Instructions   None    ED Prescriptions   None    PDMP not reviewed this encounter.   Valinda Hoar, NP 08/19/21 1251

## 2021-08-19 NOTE — Discharge Instructions (Signed)
Today you are being treated for inflammation to your upper airways ? ?Begin use of prednisone every morning with food for 5 days to help reduce inflammation  ? ?Take Augmentin twice daily for the next 10 days ? ?For worsening signs of breathing please go to the nearest emergency department for evaluation ? ?In addition: ? ?Maintaining adequate hydration may help to thin secretions and soothe the respiratory mucosa  ? ?Warm Liquids- Ingestion of warm liquids may have a soothing effect on the respiratory mucosa, increase the flow of nasal mucus, and loosen respiratory secretions, making them easier to remove ? ?May try honey (2.5 to 5 mL [0.5 to 1 teaspoon]) can be given straight or diluted in liquid (juice). Corn syrup may be substituted if honey is not available.    ?

## 2021-08-19 NOTE — ED Triage Notes (Signed)
C/O productive cough, nasal congestion onset 3 days ago without fevers. Today also c/o HA. ?

## 2021-09-12 ENCOUNTER — Telehealth: Payer: Self-pay | Admitting: Primary Care

## 2021-09-13 NOTE — Telephone Encounter (Signed)
Lmtcb for pt.  

## 2021-09-24 ENCOUNTER — Ambulatory Visit (HOSPITAL_COMMUNITY)
Admission: RE | Admit: 2021-09-24 | Discharge: 2021-09-24 | Disposition: A | Discharge status: Home / Self Care | Payer: PPO | Source: Ambulatory Visit | Attending: Family Medicine | Admitting: Family Medicine

## 2021-09-24 ENCOUNTER — Encounter (HOSPITAL_COMMUNITY): Payer: Self-pay

## 2021-09-24 VITALS — BP 153/76 | HR 70 | Temp 97.9°F | Resp 19

## 2021-09-24 DIAGNOSIS — S70362A Insect bite (nonvenomous), left thigh, initial encounter: Secondary | ICD-10-CM

## 2021-09-24 DIAGNOSIS — W57XXXA Bitten or stung by nonvenomous insect and other nonvenomous arthropods, initial encounter: Secondary | ICD-10-CM | POA: Diagnosis not present

## 2021-09-24 MED ORDER — DOXYCYCLINE HYCLATE 100 MG PO CAPS: 100.0000 mg | ORAL_CAPSULE | Freq: Two times a day (BID) | ORAL | 0 refills | Status: AC

## 2021-09-24 NOTE — ED Provider Notes (Signed)
[place]    CSN: [id number] Arrival date & time: 09/24/21  1538      History   Chief Complaint Chief Complaint  Patient presents with   Tick Removal    HPI Cody Willis is a 81 y.o. male.   HPI Here for a tick bite.  Yesterday evening he had noticed some itching and a bump on his left proximal posterior thigh near his perineum.  Today he recognized that there was a bump there that was probably a bug.  His wife removed most of the tick.  He is concerned there is still some parts there.  No fever or chills or rash  Past Medical History:  Diagnosis Date   Arthritis    fingers   Basal cell carcinoma 02/10/2008   left top of shoulder   Carpal tunnel syndrome    Cataract    bilateral surgery repair   Chronic pancreatitis ([place])    COPD (chronic obstructive pulmonary disease) ([place])    COVID-19    Esophageal reflux    Gastric polyps    hyperplastic   Herpes zoster    Hiatal hernia    Other B-complex deficiencies    Pure hypercholesterolemia    Stroke ([place]) 01/16/2015   only on ASA    Patient Active Problem List   Diagnosis Date Noted   Coronary artery disease involving native coronary artery of native heart without angina pectoris 02/02/2021   Dyspnea 02/02/2021   Chest pain of uncertain etiology 32/99/2426   Carotid bruit 02/02/2021   Pulmonary nodules 10/28/2020   Aortic atherosclerosis ([place]) 10/28/2020   Prediabetes 05/29/2018   Hx of melanoma excision 05/29/2018   Leucocytosis 05/29/2018   Proteinuria 04/29/2017   Former smoker 10/31/2015   Preventative health care 07/26/2015   Advance directive discussed with patient 07/26/2015   Pain in hand 07/25/2015   Occipital neuralgia of right side 05/02/2015   History of CVA (cerebrovascular accident) 03/21/2015   Essential hypertension 03/21/2015   COPD (chronic obstructive pulmonary disease) ([place]) 08/17/2014   CHRONIC PANCREATITIS 01/03/2010   B12 DEFICIENCY 05/13/2009   Mixed hyperlipidemia  07/02/2007   ACID REFLUX DISEASE 07/02/2007    Past Surgical History:  Procedure Laterality Date   APPENDECTOMY     BACK SURGERY     Diskectomy between L4 and L5   CARPAL TUNNEL RELEASE Left 06/27/2021   Procedure: CARPAL TUNNEL RELEASE LEFT;  Surgeon: Daryll Brod, MD;  Location: [facility];  Service: Orthopedics;  Laterality: Left;  Regional with monitored anesthesia care   CATARACT EXTRACTION W/ INTRAOCULAR LENS  IMPLANT, BILATERAL  12/15 and 2/16   CHOLECYSTECTOMY     COLONOSCOPY  2011   MELANOMA EXCISION  ~2009   right upper abdomen       Home Medications    Prior to Admission medications   Medication Sig Start Date End Date Taking? Authorizing Provider  doxycycline (VIBRAMYCIN) 100 MG capsule Take 1 capsule (100 mg total) by mouth 2 (two) times daily for 7 days. 09/24/21 10/01/21 Yes Paulina Muchmore, Gwenlyn Perking, MD  albuterol (VENTOLIN HFA) 108 (90 Base) MCG/ACT inhaler Inhale 1-2 puffs into the lungs every 4 (four) hours as needed for wheezing or shortness of breath. 03/03/21   Melynda Ripple, MD  aspirin EC 81 MG tablet Take 1 tablet (81 mg total) by mouth daily. Swallow whole. 02/02/21   Jerline Pain, MD  cyanocobalamin (,VITAMIN B-12,) 1000 MCG/ML injection INJECT 1ML INTO THE MUSCLE EVERY 30 DAYS. PHARMACY-PLEASE PROVIDE APPROPRIATE  NEEDLES AND SYRINGES 02/02/21   Pyrtle, Lajuan Lines, MD  ketoconazole (NIZORAL) 2 % shampoo apply three times per week, massage into scalp and leave in for 10 minutes before rinsing out 07/27/21   Ralene Bathe, MD  pantoprazole (PROTONIX) 40 MG tablet Take 1 tablet (40 mg total) by mouth every morning. 02/02/21   Pyrtle, Lajuan Lines, MD  rosuvastatin (CRESTOR) 20 MG tablet Take 1 tablet (20 mg total) by mouth daily. 02/02/21   Jerline Pain, MD  traMADol (ULTRAM) 50 MG tablet Take 1 tablet (50 mg total) by mouth every 6 (six) hours as needed. 06/27/21   Daryll Brod, MD  triamcinolone (KENALOG) 0.025 % ointment daily as needed for rash. 03/27/18    [provider]  [name] 25000-79000 units CPEP TAKE 2 CAPSULES BY MOUTH 3 TIMES DAILY WITH MEALS 08/08/21   Pyrtle, Lajuan Lines, MD    Family History Family History  Problem Relation Age of Onset   Stroke Mother    Diabetes Mother    Emphysema Father        smoker   Crohn's disease Sister    Colitis Brother    Colon cancer Neg Hx    Colon polyps Neg Hx    Esophageal cancer Neg Hx    Rectal cancer Neg Hx    Stomach cancer Neg Hx     Social History Social History   Tobacco Use   Smoking status: Former    Types: Cigarettes   Smokeless tobacco: Never  Vaping Use   Vaping Use: Never used  Substance Use Topics   Alcohol use: No    Alcohol/week: 0.0 standard drinks of alcohol   Drug use: Never     Allergies   Meloxicam, Plavix [clopidogrel bisulfate], and Sulfamethoxazole-trimethoprim   Review of Systems Review of Systems   Physical Exam Triage Vital Signs ED Triage Vitals  Enc Vitals Group     BP 09/24/21 1610 (!) 153/76     Pulse Rate 09/24/21 1610 70     Resp 09/24/21 1610 19     Temp 09/24/21 1610 97.9 F (36.6 C)     Temp Source 09/24/21 1610 Oral     SpO2 09/24/21 1610 94 %     Weight --      Height --      Head Circumference --      Peak Flow --      Pain Score 09/24/21 1609 0     Pain Loc --      Pain Edu? --      Excl. in [place]? --    No data found.  Updated Vital Signs BP (!) 153/76 (BP Location: Right Arm)   Pulse 70   Temp 97.9 F (36.6 C) (Oral)   Resp 19   SpO2 94%   Visual Acuity Right Eye Distance:   Left Eye Distance:   Bilateral Distance:    Right Eye Near:   Left Eye Near:    Bilateral Near:     Physical Exam Vitals reviewed.  Constitutional:      General: He is not in acute distress.    Appearance: He is not ill-appearing, toxic-appearing or diaphoretic.  HENT:     Mouth/Throat:     Mouth: Mucous membranes are moist.     Pharynx: No oropharyngeal exudate or posterior oropharyngeal erythema.  Eyes:     Extraocular  Movements: Extraocular movements intact.     Conjunctiva/sclera: Conjunctivae normal.     Pupils: Pupils are equal,  round, and reactive to light.  Cardiovascular:     Rate and Rhythm: Normal rate and regular rhythm.  Musculoskeletal:     Cervical back: Neck supple.  Lymphadenopathy:     Cervical: No cervical adenopathy.  Skin:    Comments: There is an area of mild erythema and mild induration about 1 cm in diameter.  This is on his proximal posterior left thigh near the perineum.  There is a central area of eschar.  I cannot determine there is any foreign body/bug parts there.  Neurological:     Mental Status: He is alert.      UC Treatments / Results  Labs (all labs ordered are listed, but only abnormal results are displayed) Labs Reviewed - No data to display  EKG   Radiology No results found.  Procedures Procedures (including critical care time)  Medications Ordered in UC Medications - No data to display  Initial Impression / Assessment and Plan / UC Course  I have reviewed the triage vital signs and the nursing notes.  Pertinent labs & imaging results that were available during my care of the patient were reviewed by me and considered in my medical decision making (see chart for details).     I discussed with him that I do not think it is worth my digging in that area to try to get out any parts of a tick.  He will apply antibiotic ointment to it twice daily, and I am sending in doxycycline to prevent Lyme disease Final Clinical Impressions(s) / UC Diagnoses   Final diagnoses:  Tick bite of left thigh, initial encounter     Discharge Instructions      Take doxycycline 100 mg--1 capsule 2 times daily for 7 days  Antibiotic ointment on the spot where the tick bit you 2 times daily until healed     ED Prescriptions     Medication Sig Dispense Auth. Provider   doxycycline (VIBRAMYCIN) 100 MG capsule Take 1 capsule (100 mg total) by mouth 2 (two) times  daily for 7 days. 14 capsule Windy Carina, Gwenlyn Perking, MD      PDMP not reviewed this encounter.   Barrett Henle, MD 09/24/21 [id number]

## 2021-09-24 NOTE — Discharge Instructions (Addendum)
Take doxycycline 100 mg--1 capsule 2 times daily for 7 days  Antibiotic ointment on the spot where the tick bit you 2 times daily until healed

## 2021-09-24 NOTE — ED Triage Notes (Signed)
Pt presents with c/o a tick on his left inner thigh. Pt states he was unable to get it completley out. Pt states it is itchy.

## 2021-10-04 NOTE — Telephone Encounter (Signed)
Noted     Closing encounter

## 2021-10-20 ENCOUNTER — Other Ambulatory Visit: Payer: PPO

## 2021-10-20 ENCOUNTER — Telehealth: Payer: Self-pay | Admitting: Family Medicine

## 2021-10-20 NOTE — Telephone Encounter (Signed)
Left message for patient to call back and schedule Medicare Annual Wellness Visit (AWV) either virtually or phone   Last AWV  10/20/20  I left my direct # [phone]

## 2021-11-15 ENCOUNTER — Telehealth: Payer: Self-pay | Admitting: Family Medicine

## 2021-11-15 NOTE — Telephone Encounter (Signed)
Left message for patient to call back and schedule Medicare Annual Wellness Visit (AWV) either virtually or phone   Last AWV ;10/20/20   I left my direct # [phone]

## 2021-11-16 ENCOUNTER — Encounter: Payer: Self-pay | Admitting: Nurse Practitioner

## 2021-11-16 ENCOUNTER — Ambulatory Visit: Payer: PPO | Admitting: Nurse Practitioner

## 2021-11-16 ENCOUNTER — Ambulatory Visit (HOSPITAL_COMMUNITY)
Admission: RE | Admit: 2021-11-16 | Discharge: 2021-11-16 | Disposition: A | Discharge status: Home / Self Care | Payer: PPO | Source: Ambulatory Visit | Attending: Physician Assistant | Admitting: Physician Assistant

## 2021-11-16 ENCOUNTER — Other Ambulatory Visit (INDEPENDENT_AMBULATORY_CARE_PROVIDER_SITE_OTHER): Payer: PPO

## 2021-11-16 ENCOUNTER — Encounter (HOSPITAL_COMMUNITY): Payer: Self-pay

## 2021-11-16 VITALS — BP 153/68 | HR 63 | Temp 97.5°F | Resp 18

## 2021-11-16 VITALS — BP 140/68 | HR 64 | Ht 66.0 in | Wt 168.0 lb

## 2021-11-16 DIAGNOSIS — M79604 Pain in right leg: Secondary | ICD-10-CM | POA: Diagnosis not present

## 2021-11-16 DIAGNOSIS — E538 Deficiency of other specified B group vitamins: Secondary | ICD-10-CM | POA: Diagnosis not present

## 2021-11-16 DIAGNOSIS — K861 Other chronic pancreatitis: Secondary | ICD-10-CM | POA: Diagnosis not present

## 2021-11-16 DIAGNOSIS — R1310 Dysphagia, unspecified: Secondary | ICD-10-CM

## 2021-11-16 DIAGNOSIS — M1990 Unspecified osteoarthritis, unspecified site: Secondary | ICD-10-CM

## 2021-11-16 DIAGNOSIS — Z8719 Personal history of other diseases of the digestive system: Secondary | ICD-10-CM

## 2021-11-16 LAB — CBC WITH DIFFERENTIAL/PLATELET
Basophils Absolute: 0.1 10*3/uL (ref 0.0–0.1)
Basophils Relative: 0.9 % (ref 0.0–3.0)
Eosinophils Absolute: 0.2 10*3/uL (ref 0.0–0.7)
Eosinophils Relative: 1.9 % (ref 0.0–5.0)
HCT: 43.4 % (ref 39.0–52.0)
Hemoglobin: 14.5 g/dL (ref 13.0–17.0)
Lymphocytes Relative: 17.7 % (ref 12.0–46.0)
Lymphs Abs: 1.7 10*3/uL (ref 0.7–4.0)
MCHC: 33.4 g/dL (ref 30.0–36.0)
MCV: 89.7 fl (ref 78.0–100.0)
Monocytes Absolute: 1.4 10*3/uL — ABNORMAL HIGH (ref 0.1–1.0)
Monocytes Relative: 14.1 % — ABNORMAL HIGH (ref 3.0–12.0)
Neutro Abs: 6.3 10*3/uL (ref 1.4–7.7)
Neutrophils Relative %: 65.4 % (ref 43.0–77.0)
Platelets: 162 10*3/uL (ref 150.0–400.0)
RBC: 4.84 Mil/uL (ref 4.22–5.81)
RDW: 14.5 % (ref 11.5–15.5)
WBC: 9.7 10*3/uL (ref 4.0–10.5)

## 2021-11-16 LAB — COMPREHENSIVE METABOLIC PANEL
ALT: 12 U/L (ref 0–53)
AST: 18 U/L (ref 0–37)
Albumin: 4.7 g/dL (ref 3.5–5.2)
Alkaline Phosphatase: 58 U/L (ref 39–117)
BUN: 22 mg/dL (ref 6–23)
CO2: 33 mEq/L — ABNORMAL HIGH (ref 19–32)
Calcium: 9.3 mg/dL (ref 8.4–10.5)
Chloride: 104 mEq/L (ref 96–112)
Creatinine, Ser: 0.97 mg/dL (ref 0.40–1.50)
GFR: 73.75 mL/min (ref >60.00)
Glucose, Bld: 67 mg/dL — ABNORMAL LOW (ref 70–99)
Potassium: 3.7 mEq/L (ref 3.5–5.1)
Sodium: 146 mEq/L — ABNORMAL HIGH (ref 135–145)
Total Bilirubin: 0.8 mg/dL (ref 0.2–1.2)
Total Protein: 6.9 g/dL (ref 6.0–8.3)

## 2021-11-16 LAB — VITAMIN B12: Vitamin B-12: 578 pg/mL (ref 211–911)

## 2021-11-16 LAB — LIPASE: Lipase: 28 U/L (ref 11.0–59.0)

## 2021-11-16 MED ORDER — ZENPEP 25000-79000 UNITS PO CPEP: 2.0000 | ORAL_CAPSULE | Freq: Three times a day (TID) | ORAL | 3 refills | Status: DC

## 2021-11-16 MED ORDER — CYANOCOBALAMIN 1000 MCG/ML IJ SOLN: INTRAMUSCULAR | 3 refills | Status: DC

## 2021-11-16 MED ORDER — IBUPROFEN 600 MG PO TABS: 600.0000 mg | ORAL_TABLET | Freq: Three times a day (TID) | ORAL | 0 refills | Status: DC

## 2021-11-16 MED ORDER — PANTOPRAZOLE SODIUM 40 MG PO TBEC: 40.0000 mg | DELAYED_RELEASE_TABLET | Freq: Every morning | ORAL | 3 refills | Status: DC

## 2021-11-16 MED ORDER — PREDNISONE 10 MG PO TABS: 10.0000 mg | ORAL_TABLET | Freq: Three times a day (TID) | ORAL | 0 refills | Status: DC

## 2021-11-16 MED ORDER — FAMOTIDINE 20 MG PO TABS: 20.0000 mg | ORAL_TABLET | Freq: Every day | ORAL | 1 refills | Status: DC

## 2021-11-16 NOTE — Patient Instructions (Signed)
Your provider has requested that you go to the basement level for lab work before leaving today. Press "B" on the elevator. The lab is located at the first door on the left as you exit the elevator.  We have sent the following medications to your pharmacy for you to pick up at your convenience: Famotidine 20 mg nightly.  Refilled Pantoprazole, [name] and B12  _______________________________________________________  If you are age 80 or older, your body mass index should be between 23-30. Your Body mass index is 27.12 kg/m. If this is out of the aforementioned range listed, please consider follow up with your Primary Care Provider.  If you are age 77 or younger, your body mass index should be between 19-25. Your Body mass index is 27.12 kg/m. If this is out of the aformentioned range listed, please consider follow up with your Primary Care Provider.   ________________________________________________________  The [facility] providers would like to encourage you to use [facility] to communicate with providers for non-urgent requests or questions.  Due to long hold times on the telephone, sending your provider a message by [facility] may be a faster and more efficient way to get a response.  Please allow 48 business hours for a response.  Please remember that this is for non-urgent requests.  _______________________________________________________

## 2021-11-16 NOTE — ED Triage Notes (Addendum)
Pt denies any known injury. C/O right knee pain extending down into right lower leg. Reports some swelling. States was crawling in his crawl space approx 3 wks ago, and pain started approx 2 wks ago. Pain worse with ambulation, but also with extended sitting. RLE CMS intact. Has been applying topical pain spray and taking Aleve X without relief.

## 2021-11-16 NOTE — Discharge Instructions (Addendum)
Advised to take the prednisone 10 mg 1 3 times a day for 5 days to help reduce the acute inflammation. Advised take ibuprofen 600 mg 1 every 8 hours with food to help decrease the pain and inflammation. Advised to follow-up with PCP or return to urgent care if symptoms fail to improve over the next 5 to 7 days.

## 2021-11-16 NOTE — ED Provider Notes (Signed)
Cody Willis    CSN: [id number] Arrival date & time: 11/16/21  1214      History   Chief Complaint Chief Complaint  Patient presents with   Appointment   Leg Pain    HPI Cody Willis is a 81 y.o. male.   81 year old male presents with right knee pain patient indicates that he was crawling underneath the house in the crawl space several times to look Adeleke pipe.  Patient indicates this was about 3 weeks ago, he then started having right knee pain which she has been persistent over the past 2 weeks.  He Clearence Cheek most of his pain is when he was up walking and sometimes when he was sitting he has the the pain occur.  Patient indicates he has been using a Aleve X spray and an another pain relief spray to his knee but this is not giving him any benefit.  Patient indicates he has not taken any medicines orally to help relieve his pain.  He does relate that he has had some mild swelling of the lower extremities since having the knee pain.  Patient denies weakness, numbness, or tingling.   Leg Pain   Past Medical History:  Diagnosis Date   Arthritis    fingers   Basal cell carcinoma 02/10/2008   left top of shoulder   Carpal tunnel syndrome    Cataract    bilateral surgery repair   Chronic pancreatitis ([place])    COPD (chronic obstructive pulmonary disease) ([place])    COVID-19    Esophageal reflux    Gastric polyps    hyperplastic   Herpes zoster    Hiatal hernia    Other B-complex deficiencies    Pure hypercholesterolemia    Stroke ([place]) 01/16/2015   only on ASA    Patient Active Problem List   Diagnosis Date Noted   Coronary artery disease involving native coronary artery of native heart without angina pectoris 02/02/2021   Dyspnea 02/02/2021   Chest pain of uncertain etiology 70/96/2836   Carotid bruit 02/02/2021   Pulmonary nodules 10/28/2020   Aortic atherosclerosis ([place]) 10/28/2020   Prediabetes 05/29/2018   Hx of melanoma excision 05/29/2018    Leucocytosis 05/29/2018   Proteinuria 04/29/2017   Former smoker 10/31/2015   Preventative health care 07/26/2015   Advance directive discussed with patient 07/26/2015   Pain in hand 07/25/2015   Occipital neuralgia of right side 05/02/2015   History of CVA (cerebrovascular accident) 03/21/2015   Essential hypertension 03/21/2015   COPD (chronic obstructive pulmonary disease) ([place]) 08/17/2014   CHRONIC PANCREATITIS 01/03/2010   B12 DEFICIENCY 05/13/2009   Mixed hyperlipidemia 07/02/2007   ACID REFLUX DISEASE 07/02/2007    Past Surgical History:  Procedure Laterality Date   APPENDECTOMY     BACK SURGERY     Diskectomy between L4 and L5   CARPAL TUNNEL RELEASE Left 06/27/2021   Procedure: CARPAL TUNNEL RELEASE LEFT;  Surgeon: Daryll Brod, MD;  Location: [facility];  Service: Orthopedics;  Laterality: Left;  Regional with monitored anesthesia care   CATARACT EXTRACTION W/ INTRAOCULAR LENS  IMPLANT, BILATERAL  12/15 and 2/16   CHOLECYSTECTOMY     COLONOSCOPY  2011   MELANOMA EXCISION  ~2009   right upper abdomen       Home Medications    Prior to Admission medications   Medication Sig Start Date End Date Taking? Authorizing Provider  aspirin EC 81 MG tablet Take 1 tablet (81 mg total) by mouth  daily. Swallow whole. 02/02/21  Yes Jerline Pain, MD  cyanocobalamin (VITAMIN B12) 1000 MCG/ML injection INJECT 1ML INTO THE MUSCLE EVERY 30 DAYS. PHARMACY-PLEASE PROVIDE APPROPRIATE NEEDLES AND SYRINGES 11/16/21  Yes Noralyn Pick, NP  ibuprofen (ADVIL) 600 MG tablet Take 1 tablet (600 mg total) by mouth 3 (three) times daily. 11/16/21  Yes Nyoka Lint, PA-C  Pancrelipase, Lip-Prot-Amyl, ([name]) 25000-79000 units CPEP Take 2 capsules by mouth with breakfast, with lunch, and with evening meal. 11/16/21  Yes Noralyn Pick, NP  pantoprazole (PROTONIX) 40 MG tablet Take 1 tablet (40 mg total) by mouth every morning. 11/16/21  Yes Noralyn Pick, NP   predniSONE (DELTASONE) 10 MG tablet Take 1 tablet (10 mg total) by mouth 3 (three) times daily. 11/16/21  Yes Nyoka Lint, PA-C  rosuvastatin (CRESTOR) 20 MG tablet Take 1 tablet (20 mg total) by mouth daily. 02/02/21  Yes Jerline Pain, MD  albuterol (VENTOLIN HFA) 108 (90 Base) MCG/ACT inhaler Inhale 1-2 puffs into the lungs every 4 (four) hours as needed for wheezing or shortness of breath. 03/03/21   Melynda Ripple, MD  famotidine (PEPCID) 20 MG tablet Take 1 tablet (20 mg total) by mouth at bedtime. 11/16/21   Noralyn Pick, NP  ketoconazole (NIZORAL) 2 % shampoo apply three times per week, massage into scalp and leave in for 10 minutes before rinsing out 07/27/21   Ralene Bathe, MD  traMADol (ULTRAM) 50 MG tablet Take 1 tablet (50 mg total) by mouth every 6 (six) hours as needed. 06/27/21   Daryll Brod, MD  triamcinolone (KENALOG) 0.025 % ointment daily as needed for rash. 03/27/18   [provider]    Family History Family History  Problem Relation Age of Onset   Stroke Mother    Diabetes Mother    Emphysema Father        smoker   Crohn's disease Sister    Colitis Brother    Colon cancer Neg Hx    Colon polyps Neg Hx    Esophageal cancer Neg Hx    Rectal cancer Neg Hx    Stomach cancer Neg Hx     Social History Social History   Tobacco Use   Smoking status: Former    Types: Cigarettes   Smokeless tobacco: Never  Vaping Use   Vaping Use: Never used  Substance Use Topics   Alcohol use: No    Alcohol/week: 0.0 standard drinks of alcohol   Drug use: Never     Allergies   Meloxicam, Plavix [clopidogrel bisulfate], and Sulfamethoxazole-trimethoprim   Review of Systems Review of Systems  Musculoskeletal:  Positive for arthralgias (right knee pain).     Physical Exam Triage Vital Signs ED Triage Vitals  Enc Vitals Group     BP 11/16/21 1236 (!) 153/68     Pulse Rate 11/16/21 1236 63     Resp 11/16/21 1236 18     Temp 11/16/21 1236  (!) 97.5 F (36.4 C)     Temp Source 11/16/21 1236 Oral     SpO2 11/16/21 1236 98 %     Weight --      Height --      Head Circumference --      Peak Flow --      Pain Score 11/16/21 1240 5     Pain Loc --      Pain Edu? --      Excl. in [place]? --    No data found.  Updated Vital Signs BP (!) 153/68   Pulse 63   Temp (!) 97.5 F (36.4 C) (Oral)   Resp 18   SpO2 98%   Visual Acuity Right Eye Distance:   Left Eye Distance:   Bilateral Distance:    Right Eye Near:   Left Eye Near:    Bilateral Near:     Physical Exam Constitutional:      Appearance: Normal appearance.  Musculoskeletal:       Legs:     Comments: Right knee: Full range of motion is present, no crepitus noted.  Stability is intact, negative varus valgus stressing, negative drawers negative Lachman's.  Pain is palpated along the joint line bilaterally, there is no swelling of the knee or redness.  There is no pain on palpation of the posterior calf, negative Homans' sign.  Neurological:     Mental Status: He is alert.      UC Treatments / Results  Labs (all labs ordered are listed, but only abnormal results are displayed) Labs Reviewed - No data to display  EKG   Radiology No results found.  Procedures Procedures (including critical care time)  Medications Ordered in UC Medications - No data to display  Initial Impression / Assessment and Plan / UC Course  I have reviewed the triage vital signs and the nursing notes.  Pertinent labs & imaging results that were available during my care of the patient were reviewed by me and considered in my medical decision making (see chart for details).    Plan: 1.  Advised to take the ibuprofen 600 mg 1 every 8 hours with food to help relieve the knee pain. 2.  Advised take prednisone 10 mg 1 3 times a day for 5 days only to help reduce the acute inflammation and the arthritic discomfort. 3.  Advised follow-up PCP or return to urgent care if symptoms  fail to improve Final Clinical Impressions(s) / UC Diagnoses   Final diagnoses:  Right leg pain  Arthritis     Discharge Instructions      Advised to take the prednisone 10 mg 1 3 times a day for 5 days to help reduce the acute inflammation. Advised take ibuprofen 600 mg 1 every 8 hours with food to help decrease the pain and inflammation. Advised to follow-up with PCP or return to urgent care if symptoms fail to improve over the next 5 to 7 days.    ED Prescriptions     Medication Sig Dispense Auth. Provider   predniSONE (DELTASONE) 10 MG tablet Take 1 tablet (10 mg total) by mouth 3 (three) times daily. 15 tablet Nyoka Lint, PA-C   ibuprofen (ADVIL) 600 MG tablet Take 1 tablet (600 mg total) by mouth 3 (three) times daily. 30 tablet Nyoka Lint, PA-C      PDMP not reviewed this encounter.   Nyoka Lint, PA-C 11/16/21 1310

## 2021-11-16 NOTE — Progress Notes (Signed)
11/16/2021 Cody Willis [id number] 12/10/1940   Chief Complaint: Difficulty swallowing, upper abdominal discomfort  History of Present Illness: Cody Willis. Cody Willis is an 81 year old male with a past medical history of arthritis, malignant melanoma, CVA, COPD, chronic idiopathic pancreatitis on [name], GERD and gastric hyperplastic polyps.  He is followed by Dr. Rhea Belton.  He resents today to refill his [name], Protonix and B12 prescriptions.  He is also experiencing dysphagia with epigastric and RUQ pain.  He describes having food which gets stuck in his throat/upper esophagus which results in coughing up the stuck food which he read chews and swallows and it passes down the esophagus into his stomach.  His dysphagia symptoms are occurring daily which has progressively worsened over the past year.  He sometimes has difficulty swallowing pills as well. He denies having any heartburn.  He remains on Pantoprazole 40 mg once daily.  He complains of upper abdominal hunger pain which occurs 1 hour after eating.  No nausea or vomiting.  He underwent an EGD 10/06/2015 which showed grade a esophagitis, the esophagus was empirically dilated, a single gastric polypoid lesion and a single duodenal polyp were biopsied.  Follow-up EGD 11/2016 showed a stable gastric hyperplastic polyp.  He is passing a formed soft bowel movement most days, he infrequently has a nonbloody loose stool.  No floating or greasy stools.  His most recent colonoscopy was 07/31/2019 which showed diverticulosis to the sigmoid and cecum, no polyps.  No weight loss.  Labs 06/26/2021: WBC 8.9.  Hemoglobin 14.6.  Hematocrit 42.8.  Platelet 155.  BUN 22.  Creatinine 1.06.  Total bili 0.9.  Alk phos 52.  AST 19.  ALT 15.  No recent abdominal imaging.  Abdominal sonogram 04/2010 this visit with past cholecystectomy and a few small calcifications within the parenchyma of the pancreas.  PAST GI PROCEDURES:    Colonoscopy 07/31/2019: Diverticulosis  in the sigmoid colon and in the cecum. - Small internal hemorrhoids. - No specimens collected.  EGD 11/26/2016: - 2 cm hiatal hernia. - Stable, gastric polypoid mucosa in the antrum. Multiple biopsies. - A single, stable, duodenal polyp. Multiple biopsies. 1. Surgical [P], duodenal bulb polyp - GASTRIC-TYPE MUCOSA WITH MILD INFLAMMATION AND REACTIVE CHANGES. - NO DYSPLASIA OR MALIGNANCY. 2. Surgical [P], antral polyp - HYPERPLASTIC GASTRIC POLYP WITH INFLAMMATION AND REACTIVE CHANGES. - WARTHIN-STARRY STAIN NEGATIVE FOR HELICOBACTER PYLORI. - NO ADENOMATOUS CHANGE, INTESTINAL METAPLASIA, DYSPLASIA OR MALIGNANCY.  EGD 10/06/2015: - LA Grade A esophagitis. - No endoscopic esophageal abnormality to explain patient's dysphagia. Esophagus dilated to 17 mm with Savary over a guidewire. - A single gastric polypoid lesion. Biopsied. - A single duodenal polyp. Biopsied.  Current Outpatient Medications on File Prior to Visit  Medication Sig Dispense Refill   albuterol (VENTOLIN HFA) 108 (90 Base) MCG/ACT inhaler Inhale 1-2 puffs into the lungs every 4 (four) hours as needed for wheezing or shortness of breath. 1 each 0   aspirin EC 81 MG tablet Take 1 tablet (81 mg total) by mouth daily. Swallow whole. 90 tablet 3   ketoconazole (NIZORAL) 2 % shampoo apply three times per week, massage into scalp and leave in for 10 minutes before rinsing out 120 mL 6   rosuvastatin (CRESTOR) 20 MG tablet Take 1 tablet (20 mg total) by mouth daily. 90 tablet 3   traMADol (ULTRAM) 50 MG tablet Take 1 tablet (50 mg total) by mouth every 6 (six) hours as needed. 20 tablet 0   triamcinolone (KENALOG) 0.025 %  ointment daily as needed for rash.     No current facility-administered medications on file prior to visit.   Allergies  Allergen Reactions   Meloxicam     "Tears my stomach up"   Plavix [Clopidogrel Bisulfate] Itching   Sulfamethoxazole-Trimethoprim Rash    Current Medications, Allergies, Past Medical  History, Past Surgical History, Family History and Social History were reviewed in [employer] record.  Review of Systems:   Constitutional: Negative for fever, sweats, chills or weight loss.  Respiratory: Negative for shortness of breath.   Cardiovascular: Negative for chest pain, palpitations and leg swelling.  Gastrointestinal: See HPI.  Musculoskeletal: Negative for back pain or muscle aches.  Neurological: Negative for dizziness, headaches or paresthesias.   Physical Exam: BP (!) 140/68   Pulse 64   Ht $R'5\' 6"'kY$  (1.676 m)   Wt 168 lb (76.2 kg)   SpO2 98%   BMI 27.12 kg/m  Wt Readings from Last 3 Encounters:  11/16/21 168 lb (76.2 kg)  06/27/21 163 lb 12.8 oz (74.3 kg)  06/26/21 168 lb 12.8 oz (76.6 kg)    General: 81 year old male in no acute distress. Head: Normocephalic and atraumatic. Eyes: No scleral icterus. Conjunctiva pink . Ears: Normal auditory acuity. Mouth: No ulcers or lesions.  Lungs: Clear throughout to auscultation. Heart: Regular rate and rhythm, no murmur. Abdomen: Soft, nondistended.  Mild epigastric and RUQ tenderness without rebound or guarding.  No masses or hepatomegaly. Normal bowel sounds x 4 quadrants. RUQ scar (malignant melanoma excision). Rectal: Deferred. Musculoskeletal: Right knee pain, has appointment at urgent care today.  Extremities: No edema. Neurological: Alert oriented x 4. No focal deficits.  Psychological: Alert and cooperative. Normal mood and affect  Assessment and Recommendations:  5) 81 year old male with a history of GERD with dysphagia which has progressively worsened over the past year, occurring daily. -EGD with possible esophageal dilatation benefits and risks discussed including risk with sedation, risk of bleeding, perforation and infection  -Continue Pantoprazole 40 mg 1 p.o. daily to be taken 30 minutes before breakfast #90 with 3 additional refills -Start Famotidine 20 mg 1 p.o. nightly  2)  Epigastric and RUQ pain -See plan in # 1 -CBC, CMP and lipase level -CTAP with focus to the pancreas if EGD unrevealing   3) Chronic idiopathic pancreatitis on two [name] TID.  Occasional diarrhea, no steatorrhea  -Refill [name] 2 p.o. 3 times daily #540, 3 additional refills   4) Vitamin B12 deficiency.  Patiently does self injections monthly.  -B12 level -Refill vitamin B-12 1 vial monthly with 11 refills   5) History of hyperplastic gastric polyps, last surveillance exam August 2018, no dysplasia at that time.   6) Colorectal screening, last colonoscopy 07/31/2019 without polyps.  No further screening colonoscopies due to age.

## 2021-11-17 NOTE — Progress Notes (Signed)
Addendum: Reviewed and agree with assessment and management plan. Pt can be reassured I am updated and agree with plan in place. Thanks Maurine Minister, Lajuan Lines, MD

## 2021-11-17 NOTE — Progress Notes (Signed)
Patient was updated regarding Dr. Vena Rua input.  Patient proceed with EGD as planned.  Refer to the lab notes, his glucose level was low at 67.  He stated he did not eat breakfast prior to his appointment yesterday.  I advised for him to avoid skipping meals and to follow-up with his PCP regarding his low glucose level.

## 2021-11-30 DIAGNOSIS — H353210 Exudative age-related macular degeneration, right eye, stage unspecified: Secondary | ICD-10-CM | POA: Diagnosis not present

## 2021-12-01 DIAGNOSIS — H6123 Impacted cerumen, bilateral: Secondary | ICD-10-CM | POA: Diagnosis not present

## 2021-12-07 ENCOUNTER — Encounter: Payer: Self-pay | Admitting: Internal Medicine

## 2021-12-07 ENCOUNTER — Ambulatory Visit (AMBULATORY_SURGERY_CENTER): Payer: PPO | Admitting: Internal Medicine

## 2021-12-07 VITALS — BP 121/53 | HR 55 | Temp 97.1°F | Resp 14 | Ht 66.0 in | Wt 168.0 lb

## 2021-12-07 DIAGNOSIS — J449 Chronic obstructive pulmonary disease, unspecified: Secondary | ICD-10-CM | POA: Diagnosis not present

## 2021-12-07 DIAGNOSIS — K219 Gastro-esophageal reflux disease without esophagitis: Secondary | ICD-10-CM

## 2021-12-07 DIAGNOSIS — R1013 Epigastric pain: Secondary | ICD-10-CM

## 2021-12-07 DIAGNOSIS — R1319 Other dysphagia: Secondary | ICD-10-CM | POA: Diagnosis not present

## 2021-12-07 DIAGNOSIS — R1310 Dysphagia, unspecified: Secondary | ICD-10-CM | POA: Diagnosis not present

## 2021-12-07 MED ORDER — SODIUM CHLORIDE 0.9 % IV SOLN: 500.0000 mL | Freq: Once | INTRAVENOUS | Status: DC

## 2021-12-07 NOTE — Progress Notes (Signed)
Vs by DT  Pt's states no medical or surgical changes since previsit or office visit.  

## 2021-12-07 NOTE — Patient Instructions (Addendum)
YOU HAD AN ENDOSCOPIC PROCEDURE TODAY AT THE [facility]:   Refer to the procedure report that was given to you for any specific questions about what was found during the examination.  If the procedure report does not answer your questions, please call your gastroenterologist to clarify.  If you requested that your care partner not be given the details of your procedure findings, then the procedure report has been included in a sealed envelope for you to review at your convenience later.  YOU SHOULD EXPECT: Some feelings of bloating in the abdomen. Passage of more gas than usual.  Walking can help get rid of the air that was put into your GI tract during the procedure and reduce the bloating. If you had a lower endoscopy (such as a colonoscopy or flexible sigmoidoscopy) you may notice spotting of blood in your stool or on the toilet paper. If you underwent a bowel prep for your procedure, you may not have a normal bowel movement for a few days.  Please Note:  You might notice some irritation and congestion in your nose or some drainage.  This is from the oxygen used during your procedure.  There is no need for concern and it should clear up in a day or so.  SYMPTOMS TO REPORT IMMEDIATELY:  Following upper endoscopy (EGD)  Vomiting of blood or coffee ground material  New chest pain or pain under the shoulder blades  Painful or persistently difficult swallowing  New shortness of breath  Fever of 100F or higher  Black, tarry-looking stools  For urgent or emergent issues, a gastroenterologist can be reached at any hour by calling [phone]. Do not use MyChart messaging for urgent concerns.    DIET:  We do recommend a small meal at first, but then you may proceed to your regular diet.  Drink plenty of fluids but you should avoid alcoholic beverages for 24 hours.  ACTIVITY:  You should plan to take it easy for the rest of today and you should NOT DRIVE or use heavy machinery until  tomorrow (because of the sedation medicines used during the test).    FOLLOW UP: Our staff will call the number listed on your records the next business day following your procedure.  We will call around 7:15- 8:00 am to check on you and address any questions or concerns that you may have regarding the information given to you following your procedure. If we do not reach you, we will leave a message.  If you develop any symptoms (ie: fever, flu-like symptoms, shortness of breath, cough etc.) before then, please call [phone].  If you test positive for Covid 19 in the 2 weeks post procedure, please call and report this information to us.    If any biopsies were taken you will be contacted by phone or by letter within the next 1-3 weeks.  Please call us at [phone] if you have not heard about the biopsies in 3 weeks.    SIGNATURES/CONFIDENTIALITY: You and/or your care partner have signed paperwork which will be entered into your electronic medical record.  These signatures attest to the fact that that the information above on your After Visit Summary has been reviewed and is understood.  Full responsibility of the confidentiality of this discharge information lies with you and/or your care-partner.  

## 2021-12-07 NOTE — Progress Notes (Signed)
Report to PACU, RN, vss, BBS= Clear.  

## 2021-12-07 NOTE — Progress Notes (Signed)
Called to room to assist during endoscopic procedure.  Patient ID and intended procedure confirmed with present staff. Received instructions for my participation in the procedure from the performing physician.  

## 2021-12-07 NOTE — Op Note (Signed)
[facility] Patient Name: Briscoe Daniello Procedure Date: 12/07/2021 9:29 AM MRN: [id number] Endoscopist: Jerene Bears , MD Age: 81 Referring MD:  Date of Birth: November 03, 1940 Gender: Male Account #: [id number] Procedure:                Upper GI endoscopy Indications:              Epigastric abdominal pain, Dysphagia,                            Gastro-esophageal reflux disease Medicines:                Monitored Anesthesia Care Procedure:                Pre-Anesthesia Assessment:                           - Prior to the procedure, a History and Physical                            was performed, and patient medications and                            allergies were reviewed. The patient's tolerance of                            previous anesthesia was also reviewed. The risks                            and benefits of the procedure and the sedation                            options and risks were discussed with the patient.                            All questions were answered, and informed consent                            was obtained. Prior Anticoagulants: The patient has                            taken no previous anticoagulant or antiplatelet                            agents. ASA Grade Assessment: III - A patient with                            severe systemic disease. After reviewing the risks                            and benefits, the patient was deemed in                            satisfactory condition to undergo the procedure.  After obtaining informed consent, the endoscope was                            passed under direct vision. Throughout the                            procedure, the patient's blood pressure, pulse, and                            oxygen saturations were monitored continuously. The                            Endoscope was introduced through the mouth, and                            advanced to the second part of  duodenum. The upper                            GI endoscopy was accomplished without difficulty.                            The patient tolerated the procedure well. Scope In: Scope Out: Findings:                 No endoscopic abnormality was evident in the                            esophagus to explain the patient's complaint of                            dysphagia. It was decided, however, to proceed with                            dilation of the entire esophagus. The scope was                            withdrawn. Dilation was performed with a Maloney                            dilator with mild resistance at 52 Fr.                           A 1 cm hiatal hernia was present.                           Granular mucosa was found in the prepyloric region                            of the stomach. Biopsies were taken with a cold                            forceps for histology and Helicobacter pylori  testing (body, antrum, incisura).                           The examined duodenum was normal. Complications:            No immediate complications. Estimated Blood Loss:     Estimated blood loss was minimal. Impression:               - No endoscopic esophageal abnormality to explain                            patient's dysphagia. Esophagus dilated with 52 Fr.                            Maloney.                           - Very small, 1 cm, hiatal hernia.                           - Granular gastric mucosa. Biopsied.                           - Normal examined duodenum. Recommendation:           - Patient has a contact number available for                            emergencies. The signs and symptoms of potential                            delayed complications were discussed with the                            patient. Return to normal activities tomorrow.                            Written discharge instructions were provided to the                             patient.                           - Resume previous diet.                           - Continue present medications.                           - Await pathology results.                           - If RUQ or epigastric pain persists then CT                            abd/pelvis is recommended. Jerene Bears, MD 12/07/2021 10:08:52 AM This report has been signed electronically.

## 2021-12-07 NOTE — Progress Notes (Signed)
See office note dated 11/16/2021 for details and current H&P  Patient presenting to evaluate GERD with dysphagia Epigastric and right upper quadrant pain  He is appropriate for [facility] EGD today.

## 2021-12-08 ENCOUNTER — Telehealth: Payer: Self-pay | Admitting: *Deleted

## 2021-12-08 NOTE — Telephone Encounter (Signed)
  Follow up Call-     12/07/2021    8:57 AM 07/31/2019    7:24 AM  Call back number  Post procedure Call Back phone  # [phone]  Permission to leave phone message Yes Yes     Patient questions:  Do you have a fever, pain , or abdominal swelling? No. Pain Score  0 *  Have you tolerated food without any problems? Yes.    Have you been able to return to your normal activities? Yes.    Do you have any questions about your discharge instructions: Diet   No. Medications  No. Follow up visit  No.  Do you have questions or concerns about your Care? No.  Actions: * If pain score is 4 or above: No action needed, pain <4.

## 2021-12-18 ENCOUNTER — Encounter: Payer: Self-pay | Admitting: Internal Medicine

## 2022-01-05 DIAGNOSIS — M25561 Pain in right knee: Secondary | ICD-10-CM | POA: Diagnosis not present

## 2022-01-11 ENCOUNTER — Ambulatory Visit: Payer: PPO | Admitting: Dermatology

## 2022-01-11 DIAGNOSIS — L820 Inflamed seborrheic keratosis: Secondary | ICD-10-CM | POA: Diagnosis not present

## 2022-01-11 DIAGNOSIS — Z79899 Other long term (current) drug therapy: Secondary | ICD-10-CM

## 2022-01-11 DIAGNOSIS — L821 Other seborrheic keratosis: Secondary | ICD-10-CM

## 2022-01-11 DIAGNOSIS — L578 Other skin changes due to chronic exposure to nonionizing radiation: Secondary | ICD-10-CM | POA: Diagnosis not present

## 2022-01-11 DIAGNOSIS — L219 Seborrheic dermatitis, unspecified: Secondary | ICD-10-CM

## 2022-01-11 DIAGNOSIS — Z5111 Encounter for antineoplastic chemotherapy: Secondary | ICD-10-CM

## 2022-01-11 DIAGNOSIS — D692 Other nonthrombocytopenic purpura: Secondary | ICD-10-CM | POA: Diagnosis not present

## 2022-01-11 DIAGNOSIS — L570 Actinic keratosis: Secondary | ICD-10-CM

## 2022-01-11 MED ORDER — KETOCONAZOLE 2 % EX SHAM: MEDICATED_SHAMPOO | CUTANEOUS | 6 refills | Status: DC

## 2022-01-11 MED ORDER — FLUOROURACIL 5 % EX CREA: TOPICAL_CREAM | CUTANEOUS | 0 refills | Status: DC

## 2022-01-11 NOTE — Patient Instructions (Addendum)
Instructions for [facility] Medications  One or more of your medications was sent to the [facility] mail order compounding pharmacy. You will receive an email from them and can purchase the medicine through that link. It will then be mailed to your home at the address you confirmed. If for any reason you do not receive an email from them, please check your spam folder. If you still do not find the email, please let us know. [facility] phone number is [phone].       Due to recent changes in healthcare laws, you may see results of your pathology and/or laboratory studies on MyChart before the doctors have had a chance to review them. We understand that in some cases there may be results that are confusing or concerning to you. Please understand that not all results are received at the same time and often the doctors may need to interpret multiple results in order to provide you with the best plan of care or course of treatment. Therefore, we ask that you please give us 2 business days to thoroughly review all your results before contacting the office for clarification. Should we see a critical lab result, you will be contacted sooner.   If You Need Anything After Your Visit  If you have any questions or concerns for your doctor, please call our main line at [phone] and press option 4 to reach your doctor's medical assistant. If no one answers, please leave a voicemail as directed and we will return your call as soon as possible. Messages left after 4 pm will be answered the following business day.   You may also send us a message via MyChart. We typically respond to MyChart messages within 1-2 business days.  For prescription refills, please ask your pharmacy to contact our office. Our fax number is [phone].  If you have an urgent issue when the clinic is closed that cannot wait until the next business day, you can page your doctor at the number below.    Please note  that while we do our best to be available for urgent issues outside of office hours, we are not available 24/7.   If you have an urgent issue and are unable to reach us, you may choose to seek medical care at your doctor's office, retail clinic, urgent care center, or emergency room.  If you have a medical emergency, please immediately call 911 or go to the emergency department.  Pager Numbers  - Dr. Kowalski: [phone]  - Dr. Moye: [phone]  - Dr. Stewart: [phone]  In the event of inclement weather, please call our main line at [phone] for an update on the status of any delays or closures.  Dermatology Medication Tips: Please keep the boxes that topical medications come in in order to help keep track of the instructions about where and how to use these. Pharmacies typically print the medication instructions only on the boxes and not directly on the medication tubes.   If your medication is too expensive, please contact our office at [phone] option 4 or send us a message through MyChart.   We are unable to tell what your co-pay for medications will be in advance as this is different depending on your insurance coverage. However, we may be able to find a substitute medication at lower cost or fill out paperwork to get insurance to cover a needed medication.   If a prior authorization is required to get your medication covered by your insurance   company, please allow us 1-2 business days to complete this process.  Drug prices often vary depending on where the prescription is filled and some pharmacies may offer cheaper prices.  The website www.goodrx.com contains coupons for medications through different pharmacies. The prices here do not account for what the cost may be with help from insurance (it may be cheaper with your insurance), but the website can give you the price if you did not use any insurance.  - You can print the associated coupon and take it with your  prescription to the pharmacy.  - You may also stop by our office during regular business hours and pick up a GoodRx coupon card.  - If you need your prescription sent electronically to a different pharmacy, notify our office through [facility] MyChart or by phone at [phone] option 4.     Si Usted Necesita Algo Despus de Su Visita  Tambin puede enviarnos un mensaje a travs de MyChart. Por lo general respondemos a los mensajes de MyChart en el transcurso de 1 a 2 das hbiles.  Para renovar recetas, por favor pida a su farmacia que se ponga en contacto con nuestra oficina. Nuestro nmero de fax es el [phone].  Si tiene un asunto urgente cuando la clnica est cerrada y que no puede esperar hasta el siguiente da hbil, puede llamar/localizar a su doctor(a) al nmero que aparece a continuacin.   Por favor, tenga en cuenta que aunque hacemos todo lo posible para estar disponibles para asuntos urgentes fuera del horario de oficina, no estamos disponibles las 24 horas del da, los 7 das de la semana.   Si tiene un problema urgente y no puede comunicarse con nosotros, puede optar por buscar atencin mdica  en el consultorio de su doctor(a), en una clnica privada, en un centro de atencin urgente o en una sala de emergencias.  Si tiene una emergencia mdica, por favor llame inmediatamente al 911 o vaya a la sala de emergencias.  Nmeros de bper  - Dr. Kowalski: [phone]  - Dra. Moye: [phone]  - Dra. Stewart: [phone]  En caso de inclemencias del tiempo, por favor llame a nuestra lnea principal al [phone] para una actualizacin sobre el estado de cualquier retraso o cierre.  Consejos para la medicacin en dermatologa: Por favor, guarde las cajas en las que vienen los medicamentos de uso tpico para ayudarle a seguir las instrucciones sobre dnde y cmo usarlos. Las farmacias generalmente imprimen las instrucciones del medicamento slo en las cajas y no  directamente en los tubos del medicamento.   Si su medicamento es muy caro, por favor, pngase en contacto con nuestra oficina llamando al [phone] y presione la opcin 4 o envenos un mensaje a travs de MyChart.   No podemos decirle cul ser su copago por los medicamentos por adelantado ya que esto es diferente dependiendo de la cobertura de su seguro. Sin embargo, es posible que podamos encontrar un medicamento sustituto a menor costo o llenar un formulario para que el seguro cubra el medicamento que se considera necesario.   Si se requiere una autorizacin previa para que su compaa de seguros cubra su medicamento, por favor permtanos de 1 a 2 das hbiles para completar este proceso.  Los precios de los medicamentos varan con frecuencia dependiendo del lugar de dnde se surte la receta y alguna farmacias pueden ofrecer precios ms baratos.  El sitio web www.goodrx.com tiene cupones para medicamentos de diferentes farmacias. Los precios aqu no tienen en cuenta   lo que podra costar con la ayuda del seguro (puede ser ms barato con su seguro), pero el sitio web puede darle el precio si no utiliz ningn seguro.  - Puede imprimir el cupn correspondiente y llevarlo con su receta a la farmacia.  - Tambin puede pasar por nuestra oficina durante el horario de atencin regular y recoger una tarjeta de cupones de GoodRx.  - Si necesita que su receta se enve electrnicamente a una farmacia diferente, informe a nuestra oficina a travs de MyChart de [facility] o por telfono llamando al [phone] y presione la opcin 4.  

## 2022-01-11 NOTE — Progress Notes (Signed)
Follow-Up Visit   Subjective  Cody Willis is a 81 y.o. male who presents for the following: Actinic Keratosis. The patient has spots, moles and lesions to be evaluated, some may be new or changing and the patient has concerns that these could be cancer.  The following portions of the chart were reviewed this encounter and updated as appropriate:   Tobacco  Allergies  Meds  Problems  Med Hx  Surg Hx  Fam Hx     Review of Systems:  No other skin or systemic complaints except as noted in HPI or Assessment and Plan.  Objective  Well appearing patient in no apparent distress; mood and affect are within normal limits.  A focused examination was performed including the face and scalp. Relevant physical exam findings are noted in the Assessment and Plan.  Face, R hand, L ear x 15 (15) Erythematous thin papules/macules with gritty scale.   R forearm x 1 Erythematous stuck-on, waxy papule or plaque  Scalp Pink patches with greasy scale.    Assessment & Plan  AK (actinic keratosis) (15) Face, R hand, L ear x 15 Start 5FU/Calcipotriene mix BID x 1 week on November 1st.  Destruction of lesion - Face, R hand, L ear x 15 Complexity: simple   Destruction method: cryotherapy   Informed consent: discussed and consent obtained   Timeout:  patient name, date of birth, surgical site, and procedure verified Lesion destroyed using liquid nitrogen: Yes   Region frozen until ice ball extended beyond lesion: Yes   Outcome: patient tolerated procedure well with no complications   Post-procedure details: wound care instructions given    Actinic Damage - Severe, confluent actinic changes with pre-cancerous actinic keratoses  - Severe, chronic, not at goal, secondary to cumulative UV radiation exposure over time - diffuse scaly erythematous macules and papules with underlying dyspigmentation - Discussed Prescription "Field Treatment" for Severe, Chronic Confluent Actinic Changes with  Pre-Cancerous Actinic Keratoses Field treatment involves treatment of an entire area of skin that has confluent Actinic Changes (Sun/ Ultraviolet light damage) and PreCancerous Actinic Keratoses by method of PhotoDynamic Therapy (PDT) and/or prescription Topical Chemotherapy agents such as 5-fluorouracil, 5-fluorouracil/calcipotriene, and/or imiquimod.  The purpose is to decrease the number of clinically evident and subclinical PreCancerous lesions to prevent progression to development of skin cancer by chemically destroying early precancer changes that may or may not be visible.  It has been shown to reduce the risk of developing skin cancer in the treated area. As a result of treatment, redness, scaling, crusting, and open sores may occur during treatment course. One or more than one of these methods may be used and may have to be used several times to control, suppress and eliminate the PreCancerous changes. Discussed treatment course, expected reaction, and possible side effects. - Recommend daily broad spectrum sunscreen SPF 30+ to sun-exposed areas, reapply every 2 hours as needed.  - Staying in the shade or wearing long sleeves, sun glasses (UVA+UVB protection) and wide brim hats (4-inch brim around the entire circumference of the hat) are also recommended. - Call for new or changing lesions.  fluorouracil (EFUDEX) 5 % cream - Face, R hand, L ear x 15 Apply to the forehead BID x 1 week.  Inflamed seborrheic keratosis R forearm x 1 Symptomatic, irritating, patient would like treated. Destruction of lesion - R forearm x 1 Complexity: simple   Destruction method: cryotherapy   Informed consent: discussed and consent obtained   Timeout:  patient name,  date of birth, surgical site, and procedure verified Lesion destroyed using liquid nitrogen: Yes   Region frozen until ice ball extended beyond lesion: Yes   Outcome: patient tolerated procedure well with no complications   Post-procedure  details: wound care instructions given    Seborrheic dermatitis Scalp Seborrheic Dermatitis  -  is a chronic persistent rash characterized by pinkness and scaling most commonly of the mid face but also can occur on the scalp (dandruff), ears; mid chest, mid back and groin.  It tends to be exacerbated by stress and cooler weather.  People who have neurologic disease may experience new onset or exacerbation of existing seborrheic dermatitis.  The condition is not curable but treatable and can be controlled.  Continue Ketoconazole 2% shampoo use 2-3 days per week.   Related Medications ketoconazole (NIZORAL) 2 % shampoo apply three times per week, massage into scalp and leave in for 10 minutes before rinsing out  Purpura - Chronic; persistent and recurrent.  Treatable, but not curable. - Violaceous macules and patches - Benign - Related to trauma, age, sun damage and/or use of blood thinners, chronic use of topical and/or oral steroids - Observe - Can use OTC arnica containing moisturizer such as Dermend Bruise Formula if desired - Call for worsening or other concerns  Actinic Damage - chronic, secondary to cumulative UV radiation exposure/sun exposure over time - diffuse scaly erythematous macules with underlying dyspigmentation - Recommend daily broad spectrum sunscreen SPF 30+ to sun-exposed areas, reapply every 2 hours as needed.  - Recommend staying in the shade or wearing long sleeves, sun glasses (UVA+UVB protection) and wide brim hats (4-inch brim around the entire circumference of the hat). - Call for new or changing lesions.  Seborrheic Keratoses - Stuck-on, waxy, tan-brown papules and/or plaques  - Benign-appearing - Discussed benign etiology and prognosis. - Observe - Call for any changes  Return in about 6 months (around 07/13/2022) for AK follow up .  Luther Redo, CMA, am acting as scribe for Sarina Ser, MD . Documentation: I have reviewed the above  documentation for accuracy and completeness, and I agree with the above.  Sarina Ser, MD

## 2022-01-12 ENCOUNTER — Encounter: Payer: Self-pay | Admitting: Family Medicine

## 2022-01-12 ENCOUNTER — Ambulatory Visit (INDEPENDENT_AMBULATORY_CARE_PROVIDER_SITE_OTHER): Payer: PPO | Admitting: Family Medicine

## 2022-01-12 VITALS — BP 138/80 | HR 64 | Temp 97.6°F | Ht 66.0 in | Wt 163.8 lb

## 2022-01-12 DIAGNOSIS — I10 Essential (primary) hypertension: Secondary | ICD-10-CM | POA: Diagnosis not present

## 2022-01-12 DIAGNOSIS — Z87891 Personal history of nicotine dependence: Secondary | ICD-10-CM

## 2022-01-12 DIAGNOSIS — K861 Other chronic pancreatitis: Secondary | ICD-10-CM | POA: Diagnosis not present

## 2022-01-12 DIAGNOSIS — M25561 Pain in right knee: Secondary | ICD-10-CM | POA: Insufficient documentation

## 2022-01-12 DIAGNOSIS — J449 Chronic obstructive pulmonary disease, unspecified: Secondary | ICD-10-CM

## 2022-01-12 MED ORDER — PREDNISONE 20 MG PO TABS: 40.0000 mg | ORAL_TABLET | Freq: Every day | ORAL | 0 refills | Status: DC

## 2022-01-12 MED ORDER — TETANUS-DIPHTHERIA TOXOIDS TD 5-2 LFU IM INJ: 0.5000 mL | INJECTION | Freq: Once | INTRAMUSCULAR | 0 refills | Status: AC

## 2022-01-12 NOTE — Patient Instructions (Signed)
Nice to see you. Try the prednisone for your knee.  Please see orthopedics as this can hold.

## 2022-01-12 NOTE — Assessment & Plan Note (Signed)
Stable.  Normal lung sounds.  He can continue albuterol as needed.

## 2022-01-12 NOTE — Progress Notes (Signed)
Tommi Rumps, MD Phone: [phone]  Cody Willis is a 81 y.o. male who presents today for follow-up.  Hypertension: Blood pressure is typically 419-379 systolically.  No chest pain or shortness of breath.  COPD: Patient notes he rarely uses his albuterol.  No cough, wheezing, or shortness of breath.  Chronic pancreatitis: Patient is on [name].  He notes no abdominal pain.  He does follow with GI.  Right knee pain: This has been going on 6 weeks.  He saw orthopedics a week or so ago and had fluid drawn off his knee.  He gave them a call yesterday as his knee has continued to bother him and the fluid has seemed to come back.  He notes they advised they were going to send some prednisone in though the pharmacy has not gotten this yet.  He sees orthopedics again at the end of October.  Social History   Tobacco Use  Smoking Status Former   Types: Cigarettes  Smokeless Tobacco Never    Current Outpatient Medications on File Prior to Visit  Medication Sig Dispense Refill   albuterol (VENTOLIN HFA) 108 (90 Base) MCG/ACT inhaler Inhale 1-2 puffs into the lungs every 4 (four) hours as needed for wheezing or shortness of breath. 1 each 0   aspirin EC 81 MG tablet Take 1 tablet (81 mg total) by mouth daily. Swallow whole. 90 tablet 3   cyanocobalamin (VITAMIN B12) 1000 MCG/ML injection INJECT 1ML INTO THE MUSCLE EVERY 30 DAYS. PHARMACY-PLEASE PROVIDE APPROPRIATE NEEDLES AND SYRINGES 3 mL 3   famotidine (PEPCID) 20 MG tablet Take 1 tablet (20 mg total) by mouth at bedtime. 30 tablet 1   fluorouracil (EFUDEX) 5 % cream Apply to the forehead BID x 1 week. 30 g 0   ibuprofen (ADVIL) 600 MG tablet Take 1 tablet (600 mg total) by mouth 3 (three) times daily. 30 tablet 0   ketoconazole (NIZORAL) 2 % shampoo apply three times per week, massage into scalp and leave in for 10 minutes before rinsing out 120 mL 6   Pancrelipase, Lip-Prot-Amyl, ([name]) 25000-79000 units CPEP Take 2 capsules by mouth  with breakfast, with lunch, and with evening meal. 540 capsule 3   pantoprazole (PROTONIX) 40 MG tablet Take 1 tablet (40 mg total) by mouth every morning. 90 tablet 3   rosuvastatin (CRESTOR) 20 MG tablet Take 1 tablet (20 mg total) by mouth daily. 90 tablet 3   traMADol (ULTRAM) 50 MG tablet Take 1 tablet (50 mg total) by mouth every 6 (six) hours as needed. 20 tablet 0   triamcinolone (KENALOG) 0.025 % ointment daily as needed for rash.     No current facility-administered medications on file prior to visit.     ROS see history of present illness  Objective  Physical Exam Vitals:   01/12/22 0951  BP: 138/80  Pulse: 64  Temp: 97.6 F (36.4 C)  SpO2: 99%    BP Readings from Last 3 Encounters:  01/12/22 138/80  12/07/21 (!) 121/53  11/16/21 (!) 153/68   Wt Readings from Last 3 Encounters:  01/12/22 163 lb 12.8 oz (74.3 kg)  12/07/21 168 lb (76.2 kg)  11/16/21 168 lb (76.2 kg)    Physical Exam Constitutional:      General: He is not in acute distress.    Appearance: He is not diaphoretic.  Cardiovascular:     Rate and Rhythm: Normal rate and regular rhythm.     Heart sounds: Normal heart sounds.  Pulmonary:  Effort: Pulmonary effort is normal.     Breath sounds: Normal breath sounds.  Musculoskeletal:     Comments: Right knee with mild effusion, no erythema, no significant warmth, slight tenderness in the soft tissues of the right knee  Skin:    General: Skin is warm and dry.  Neurological:     Mental Status: He is alert.      Assessment/Plan: Please see individual problem list.  Problem List Items Addressed This Visit     COPD (chronic obstructive pulmonary disease) ([place]) (Chronic)    Stable.  Normal lung sounds.  He can continue albuterol as needed.      Relevant Medications   predniSONE (DELTASONE) 20 MG tablet   Essential hypertension - Primary (Chronic)    Adequately controlled for his age.  No need for medication at this time.      CHRONIC  PANCREATITIS    He will continue to see GI.      Former smoker   Relevant Orders   Ambulatory Referral for Lung Cancer Scre   Right knee pain    Likely osteoarthritis related.  He will see orthopedics and follow-up as planned.  I will send in prednisone 40 mg once daily for 5 days given that he notes orthopedics has not done this yet.  Discussed risk of sleep issues, appetite increased, and agitation with this medication.      Relevant Medications   predniSONE (DELTASONE) 20 MG tablet     Health Maintenance: Patient will get the tetanus vaccine at his pharmacy.  He declines Shingrix vaccine and updated COVID vaccination.  Return in about 6 months (around 07/14/2022).   Tommi Rumps, MD [place]

## 2022-01-12 NOTE — Assessment & Plan Note (Signed)
He will continue to see GI. 

## 2022-01-12 NOTE — Assessment & Plan Note (Signed)
Likely osteoarthritis related.  He will see orthopedics and follow-up as planned.  I will send in prednisone 40 mg once daily for 5 days given that he notes orthopedics has not done this yet.  Discussed risk of sleep issues, appetite increased, and agitation with this medication.

## 2022-01-12 NOTE — Assessment & Plan Note (Signed)
Adequately controlled for his age.  No need for medication at this time.

## 2022-01-16 ENCOUNTER — Encounter: Payer: Self-pay | Admitting: Dermatology

## 2022-01-19 DIAGNOSIS — M25561 Pain in right knee: Secondary | ICD-10-CM | POA: Diagnosis not present

## 2022-01-25 ENCOUNTER — Other Ambulatory Visit: Payer: Self-pay | Admitting: Cardiology

## 2022-01-27 DIAGNOSIS — M25561 Pain in right knee: Secondary | ICD-10-CM | POA: Diagnosis not present

## 2022-02-02 DIAGNOSIS — M25561 Pain in right knee: Secondary | ICD-10-CM | POA: Diagnosis not present

## 2022-02-10 ENCOUNTER — Encounter (HOSPITAL_COMMUNITY): Payer: Self-pay

## 2022-02-10 ENCOUNTER — Ambulatory Visit (HOSPITAL_COMMUNITY)
Admission: EM | Admit: 2022-02-10 | Discharge: 2022-02-10 | Disposition: A | Discharge status: Home / Self Care | Payer: PPO | Attending: Emergency Medicine | Admitting: Emergency Medicine

## 2022-02-10 DIAGNOSIS — J441 Chronic obstructive pulmonary disease with (acute) exacerbation: Secondary | ICD-10-CM

## 2022-02-10 DIAGNOSIS — B349 Viral infection, unspecified: Secondary | ICD-10-CM

## 2022-02-10 DIAGNOSIS — M25561 Pain in right knee: Secondary | ICD-10-CM

## 2022-02-10 MED ORDER — AMOXICILLIN-POT CLAVULANATE 875-125 MG PO TABS: 1.0000 | ORAL_TABLET | Freq: Two times a day (BID) | ORAL | 0 refills | Status: DC

## 2022-02-10 MED ORDER — PREDNISONE 20 MG PO TABS: 40.0000 mg | ORAL_TABLET | Freq: Every day | ORAL | 0 refills | Status: DC

## 2022-02-10 NOTE — ED Triage Notes (Signed)
Pt is here for , body aches, nasal congestion , runny nose, headaches  x2days

## 2022-02-10 NOTE — Discharge Instructions (Signed)
Prednisone has been sent to the pharmacy, you will take this the next 5 days, take 2 tablets each morning.  Please be mindful that this medication can cause difficulty with sleeping if taken later in the day.  Augmentin has been sent to the pharmacy, you will take this medication 2 times daily for the next 7 days.  I advised that you use your albuterol inhaler for the next 2 days every 6 hours while you are awake.  Please make sure to stay hydrated drinking at least 8 cups of water daily and eating nutrient dense meals.  Please follow-up with your PCP or this clinic if symptoms do not improve.

## 2022-02-10 NOTE — ED Provider Notes (Signed)
[facility]    CSN: [id number] Arrival date & time: 02/10/22  1430      History   Chief Complaint Chief Complaint  Patient presents with   Cough   Headache    HPI REFAEL FULOP is a 81 y.o. male.  Patient complaining of generalized body aches, nasal congestion, and rhinorrhea x 2 days.  Patient reports shortness of breath at times.  Patient reports productive cough with "gray" sputum.  Patient reports a history of similar symptoms last year when this occurred. History of COPD. Patient states he hasn't had to use his inhaler for this episode of symptoms.    Cough Associated symptoms: chills, headaches, rhinorrhea and shortness of breath   Associated symptoms: no diaphoresis, no ear pain, no fever, no sore throat and no wheezing   Headache Associated symptoms: congestion, cough and fatigue   Associated symptoms: no drainage, no ear pain, no fever, no sinus pressure, no sore throat and no weakness     Past Medical History:  Diagnosis Date   Arthritis    fingers   Basal cell carcinoma 02/10/2008   left top of shoulder   Carpal tunnel syndrome    Cataract    bilateral surgery repair   Chronic pancreatitis ([place])    COPD (chronic obstructive pulmonary disease) ([place])    COVID-19    Esophageal reflux    Gastric polyps    hyperplastic   Herpes zoster    Hiatal hernia    Other B-complex deficiencies    Pure hypercholesterolemia    Stroke ([place]) 01/16/2015   only on ASA    Patient Active Problem List   Diagnosis Date Noted   Right knee pain 01/12/2022   Coronary artery disease involving native coronary artery of native heart without angina pectoris 02/02/2021   Dyspnea 02/02/2021   Chest pain of uncertain etiology 40/98/1191   Carotid bruit 02/02/2021   Pulmonary nodules 10/28/2020   Aortic atherosclerosis ([place]) 10/28/2020   Prediabetes 05/29/2018   Hx of melanoma excision 05/29/2018   Leucocytosis 05/29/2018   Proteinuria 04/29/2017   Former smoker  10/31/2015   Preventative health care 07/26/2015   Advance directive discussed with patient 07/26/2015   Pain in hand 07/25/2015   Occipital neuralgia of right side 05/02/2015   History of CVA (cerebrovascular accident) 03/21/2015   Essential hypertension 03/21/2015   COPD (chronic obstructive pulmonary disease) ([place]) 08/17/2014   CHRONIC PANCREATITIS 01/03/2010   B12 DEFICIENCY 05/13/2009   Mixed hyperlipidemia 07/02/2007   ACID REFLUX DISEASE 07/02/2007    Past Surgical History:  Procedure Laterality Date   APPENDECTOMY     BACK SURGERY     Diskectomy between L4 and L5   CARPAL TUNNEL RELEASE Left 06/27/2021   Procedure: CARPAL TUNNEL RELEASE LEFT;  Surgeon: Daryll Brod, MD;  Location: [facility];  Service: Orthopedics;  Laterality: Left;  Regional with monitored anesthesia care   CATARACT EXTRACTION W/ INTRAOCULAR LENS  IMPLANT, BILATERAL  12/15 and 2/16   CHOLECYSTECTOMY     COLONOSCOPY  2011   MELANOMA EXCISION  ~2009   right upper abdomen       Home Medications    Prior to Admission medications   Medication Sig Start Date End Date Taking? Authorizing Provider  albuterol (VENTOLIN HFA) 108 (90 Base) MCG/ACT inhaler Inhale 1-2 puffs into the lungs every 4 (four) hours as needed for wheezing or shortness of breath. 03/03/21   Melynda Ripple, MD  amoxicillin-clavulanate (AUGMENTIN) 875-125 MG tablet Take 1  tablet by mouth every 12 (twelve) hours. 02/10/22  Yes Flossie Dibble, NP  aspirin EC 81 MG tablet Take 1 tablet (81 mg total) by mouth daily. Swallow whole. 02/02/21   Jerline Pain, MD  cyanocobalamin (VITAMIN B12) 1000 MCG/ML injection INJECT 1ML INTO THE MUSCLE EVERY 30 DAYS. PHARMACY-PLEASE PROVIDE APPROPRIATE NEEDLES AND SYRINGES 11/16/21   Noralyn Pick, NP  famotidine (PEPCID) 20 MG tablet Take 1 tablet (20 mg total) by mouth at bedtime. 11/16/21   Noralyn Pick, NP  fluorouracil (EFUDEX) 5 % cream Apply to the forehead BID  x 1 week. 01/11/22   Ralene Bathe, MD  ibuprofen (ADVIL) 600 MG tablet Take 1 tablet (600 mg total) by mouth 3 (three) times daily. 11/16/21   Nyoka Lint, PA-C  ketoconazole (NIZORAL) 2 % shampoo apply three times per week, massage into scalp and leave in for 10 minutes before rinsing out 01/11/22   Ralene Bathe, MD  Pancrelipase, Lip-Prot-Amyl, ([name]) 25000-79000 units CPEP Take 2 capsules by mouth with breakfast, with lunch, and with evening meal. 11/16/21   Noralyn Pick, NP  pantoprazole (PROTONIX) 40 MG tablet Take 1 tablet (40 mg total) by mouth every morning. 11/16/21   Noralyn Pick, NP  predniSONE (DELTASONE) 20 MG tablet Take 2 tablets (40 mg total) by mouth daily with breakfast. 02/10/22   Flossie Dibble, NP  rosuvastatin (CRESTOR) 20 MG tablet TAKE 1 TABLET BY MOUTH ONCE DAILY 01/25/22   Jerline Pain, MD  traMADol (ULTRAM) 50 MG tablet Take 1 tablet (50 mg total) by mouth every 6 (six) hours as needed. 06/27/21   Daryll Brod, MD  triamcinolone (KENALOG) 0.025 % ointment daily as needed for rash. 03/27/18   [provider]    Family History Family History  Problem Relation Age of Onset   Stroke Mother    Diabetes Mother    Emphysema Father        smoker   Crohn's disease Sister    Colitis Brother    Colon cancer Neg Hx    Colon polyps Neg Hx    Esophageal cancer Neg Hx    Rectal cancer Neg Hx    Stomach cancer Neg Hx     Social History Social History   Tobacco Use   Smoking status: Former    Types: Cigarettes   Smokeless tobacco: Never  Vaping Use   Vaping Use: Never used  Substance Use Topics   Alcohol use: No    Alcohol/week: 0.0 standard drinks of alcohol   Drug use: Never     Allergies   Meloxicam, Plavix [clopidogrel bisulfate], and Sulfamethoxazole-trimethoprim   Review of Systems Review of Systems  Constitutional:  Positive for chills and fatigue. Negative for activity change, appetite change, diaphoresis  and fever.  HENT:  Positive for congestion and rhinorrhea. Negative for ear discharge, ear pain, postnasal drip, sinus pressure, sinus pain, sore throat, tinnitus and trouble swallowing.   Respiratory:  Positive for cough and shortness of breath. Negative for apnea, chest tightness, wheezing and stridor.   Cardiovascular: Negative.   Gastrointestinal: Negative.   Neurological:  Positive for headaches. Negative for weakness and light-headedness.     Physical Exam Triage Vital Signs ED Triage Vitals  Enc Vitals Group     BP 02/10/22 1457 130/74     Pulse Rate 02/10/22 1457 75     Resp 02/10/22 1457 12     Temp 02/10/22 1457 (!) 97.5 F (36.4 C)  Temp Source 02/10/22 1457 Oral     SpO2 02/10/22 1457 93 %     Weight --      Height --      Head Circumference --      Peak Flow --      Pain Score 02/10/22 1455 0     Pain Loc --      Pain Edu? --      Excl. in [place]? --    No data found.  Updated Vital Signs BP 130/74 (BP Location: Right Arm)   Pulse 75   Temp (!) 97.5 F (36.4 C) (Oral)   Resp 12   SpO2 93%     Physical Exam Vitals and nursing note reviewed.  HENT:     Right Ear: Hearing, tympanic membrane, ear canal and external ear normal.     Left Ear: Hearing, tympanic membrane, ear canal and external ear normal.     Nose: Rhinorrhea present. No nasal tenderness or congestion. Rhinorrhea is clear.     Right Turbinates: Not enlarged, swollen or pale.     Left Turbinates: Not enlarged, swollen or pale.     Right Sinus: No maxillary sinus tenderness or frontal sinus tenderness.     Left Sinus: No maxillary sinus tenderness or frontal sinus tenderness.     Mouth/Throat:     Mouth: Mucous membranes are moist.     Pharynx: Oropharynx is clear. Uvula midline. No pharyngeal swelling, oropharyngeal exudate, posterior oropharyngeal erythema or uvula swelling.     Tonsils: No tonsillar exudate or tonsillar abscesses. 0 on the right. 0 on the left.  Cardiovascular:     Rate and  Rhythm: Normal rate and regular rhythm.     Heart sounds: Normal heart sounds, S1 normal and S2 normal.  Pulmonary:     Effort: Pulmonary effort is normal.     Breath sounds: Examination of the right-middle field reveals rhonchi. Examination of the right-lower field reveals rhonchi. Rhonchi present. No wheezing or rales.  Lymphadenopathy:     Cervical: No cervical adenopathy.      UC Treatments / Results  Labs (all labs ordered are listed, but only abnormal results are displayed) Labs Reviewed - No data to display  EKG   Radiology No results found.  Procedures Procedures (including critical care time)  Medications Ordered in UC Medications - No data to display  Initial Impression / Assessment and Plan / UC Course  I have reviewed the triage vital signs and the nursing notes.  Pertinent labs & imaging results that were available during my care of the patient were reviewed by me and considered in my medical decision making (see chart for details).     Patient was evaluated due to Viral illness and COPD exacerbation. Augmentin and Prednisone was sent to the pharmacy.  Patient was made aware of possible side effects and medication regiment.  Patient was made aware to begin using his albuterol inhaler for the next 2 days.  Patient was made aware of timeline for symptom resolution and when follow-up should occur if necessary.  Patient made aware of red flag symptoms that would warrant an emergency department visit. Patient verbalized understanding of instructions.  Final Clinical Impressions(s) / UC Diagnoses   Final diagnoses:  COPD exacerbation ([place])  Viral illness     Discharge Instructions      Prednisone has been sent to the pharmacy, you will take this the next 5 days, take 2 tablets each morning.  Please be mindful that  this medication can cause difficulty with sleeping if taken later in the day.  Augmentin has been sent to the pharmacy, you will take this medication  2 times daily for the next 7 days.  I advised that you use your albuterol inhaler for the next 2 days every 6 hours while you are awake.  Please make sure to stay hydrated drinking at least 8 cups of water daily and eating nutrient dense meals.  Please follow-up with your PCP or this clinic if symptoms do not improve.      ED Prescriptions     Medication Sig Dispense Auth. Provider   predniSONE (DELTASONE) 20 MG tablet Take 2 tablets (40 mg total) by mouth daily with breakfast. 10 tablet Flossie Dibble, NP   amoxicillin-clavulanate (AUGMENTIN) 875-125 MG tablet Take 1 tablet by mouth every 12 (twelve) hours. 14 tablet Flossie Dibble, NP      PDMP not reviewed this encounter.   Flossie Dibble, NP 02/10/22 1555

## 2022-02-15 ENCOUNTER — Telehealth: Payer: Self-pay | Admitting: Family Medicine

## 2022-02-15 NOTE — Telephone Encounter (Signed)
Copied from [place] [id number]. Topic: Medicare AWV >> Feb 15, 2022  9:45 AM Devoria Glassing wrote: Reason for CRM: Left message for patient to schedule Annual Wellness Visit.  Please schedule with Nurse Health Advisor Denisa O'Brien-Blaney, LPN at [facility]. This appt can be telephone or office visit.  Please call [phone] ask for [facility]

## 2022-02-20 ENCOUNTER — Other Ambulatory Visit: Payer: Self-pay | Admitting: Cardiology

## 2022-02-20 ENCOUNTER — Telehealth: Payer: Self-pay | Admitting: Family Medicine

## 2022-02-20 NOTE — Telephone Encounter (Signed)
Patient came into the office and would like to know if Dr Caryl Bis would start refill his rosuvastatin (CRESTOR) 20 MG tablet on March 09, 2022. He has enough until then. Please call patient if this can be done or does he have to come in to see Dr Caryl Bis. Please use [facility]

## 2022-02-20 NOTE — Telephone Encounter (Signed)
Patient would like a 90 day supply of rosuvastatin (CRESTOR) 20 MG tablet.

## 2022-02-21 ENCOUNTER — Other Ambulatory Visit: Payer: Self-pay

## 2022-02-21 NOTE — Telephone Encounter (Signed)
This medication was written yesterday  and I don't think Dr. Caryl Bis manages this medication so I did not fil it. Please advise.   Cody Willis,cma

## 2022-02-22 MED ORDER — ROSUVASTATIN CALCIUM 20 MG PO TABS: 20.0000 mg | ORAL_TABLET | Freq: Every day | ORAL | 3 refills | Status: DC

## 2022-02-22 NOTE — Telephone Encounter (Signed)
His cardiologist has been managing this medication. It looks like he is due for a visit with them. He should call them to schedule his follow-up. Though I am happy to fill the crestor if needed.

## 2022-02-22 NOTE — Telephone Encounter (Signed)
I called and spoke with the patient and he stated that the Cardiology did all the necessary test and stated he was healthy and he changed the medication he was on to Crestor because he stated it would be better for him and the patient stated he was told by the cardiologist to call if he needed anything he did not need a follow up and he wanted to see if you would manage this medications for him.  Cody Willis,cma

## 2022-02-22 NOTE — Addendum Note (Signed)
Addended by: Caryl Bis, Kayde Atkerson G on: 02/22/2022 02:12 PM   Modules accepted: Orders

## 2022-02-22 NOTE — Telephone Encounter (Signed)
Noted. Sent to pharmacy.  

## 2022-02-28 DIAGNOSIS — M25561 Pain in right knee: Secondary | ICD-10-CM | POA: Diagnosis not present

## 2022-03-06 ENCOUNTER — Ambulatory Visit (INDEPENDENT_AMBULATORY_CARE_PROVIDER_SITE_OTHER): Payer: PPO

## 2022-03-06 VITALS — Ht 66.0 in | Wt 163.0 lb

## 2022-03-06 DIAGNOSIS — Z0000 Encounter for general adult medical examination without abnormal findings: Secondary | ICD-10-CM | POA: Diagnosis not present

## 2022-03-06 NOTE — Patient Instructions (Addendum)
Cody Willis , Thank you for taking time to come for your Medicare Wellness Visit. I appreciate your ongoing commitment to your health goals. Please review the following plan we discussed and let me know if I can assist you in the future.   These are the goals we discussed:  Goals      Exercise 4x per week (30 min per time)        This is a list of the screening recommended for you and due dates:  Health Maintenance  Topic Date Due   COVID-19 Vaccine (4 - 2023-24 season) 03/22/2022*   Zoster (Shingles) Vaccine (1 of 2) 04/14/2022*   Flu Shot  07/08/2022*   Screening for Lung Cancer  07/09/2022*   Medicare Annual Wellness Visit  03/07/2023   Pneumonia Vaccine  Completed   HPV Vaccine  Aged Out  *Topic was postponed. The date shown is not the original due date.    Advanced directives: End of life planning; Advanced aging; Advanced directives discussed.  No HCPOA/Living Will.  Additional information in office, declined at this time.  Conditions/risks identified: none new  Next appointment: Follow up in one year for your annual wellness visit.   Preventive Care 80 Years and Older, Male  Preventive care refers to lifestyle choices and visits with your health care provider that can promote health and wellness. What does preventive care include? A yearly physical exam. This is also called an annual well check. Dental exams once or twice a year. Routine eye exams. Ask your health care provider how often you should have your eyes checked. Personal lifestyle choices, including: Daily care of your teeth and gums. Regular physical activity. Eating a healthy diet. Avoiding tobacco and drug use. Limiting alcohol use. Practicing safe sex. Taking low doses of aspirin every day. Taking vitamin and mineral supplements as recommended by your health care provider. What happens during an annual well check? The services and screenings done by your health care provider during your annual well  check will depend on your age, overall health, lifestyle risk factors, and family history of disease. Counseling  Your health care provider may ask you questions about your: Alcohol use. Tobacco use. Drug use. Emotional well-being. Home and relationship well-being. Sexual activity. Eating habits. History of falls. Memory and ability to understand (cognition). Work and work Statistician. Screening  You may have the following tests or measurements: Height, weight, and BMI. Blood pressure. Lipid and cholesterol levels. These may be checked every 5 years, or more frequently if you are over 41 years old. Skin check. Lung cancer screening. You may have this screening every year starting at age 67 if you have a 30-pack-year history of smoking and currently smoke or have quit within the past 15 years. Fecal occult blood test (FOBT) of the stool. You may have this test every year starting at age 24. Flexible sigmoidoscopy or colonoscopy. You may have a sigmoidoscopy every 5 years or a colonoscopy every 10 years starting at age 64. Prostate cancer screening. Recommendations will vary depending on your family history and other risks. Hepatitis C blood test. Hepatitis B blood test. Sexually transmitted disease (STD) testing. Diabetes screening. This is done by checking your blood sugar (glucose) after you have not eaten for a while (fasting). You may have this done every 1-3 years. Abdominal aortic aneurysm (AAA) screening. You may need this if you are a current or former smoker. Osteoporosis. You may be screened starting at age 4 if you are at high risk.  Talk with your health care provider about your test results, treatment options, and if necessary, the need for more tests. Vaccines  Your health care provider may recommend certain vaccines, such as: Influenza vaccine. This is recommended every year. Tetanus, diphtheria, and acellular pertussis (Tdap, Td) vaccine. You may need a Td booster  every 10 years. Zoster vaccine. You may need this after age 45. Pneumococcal 13-valent conjugate (PCV13) vaccine. One dose is recommended after age 71. Pneumococcal polysaccharide (PPSV23) vaccine. One dose is recommended after age 23. Talk to your health care provider about which screenings and vaccines you need and how often you need them. This information is not intended to replace advice given to you by your health care provider. Make sure you discuss any questions you have with your health care provider. Document Released: 04/22/2015 Document Revised: 12/14/2015 Document Reviewed: 01/25/2015 Elsevier Interactive Patient Education  2017 [employer] Prevention in the Home Falls can cause injuries. They can happen to people of all ages. There are many things you can do to make your home safe and to help prevent falls. What can I do on the outside of my home? Regularly fix the edges of walkways and driveways and fix any cracks. Remove anything that might make you trip as you walk through a door, such as a raised step or threshold. Trim any bushes or trees on the path to your home. Use bright outdoor lighting. Clear any walking paths of anything that might make someone trip, such as rocks or tools. Regularly check to see if handrails are loose or broken. Make sure that both sides of any steps have handrails. Any raised decks and porches should have guardrails on the edges. Have any leaves, snow, or ice cleared regularly. Use sand or salt on walking paths during winter. Clean up any spills in your garage right away. This includes oil or grease spills. What can I do in the bathroom? Use night lights. Install grab bars by the toilet and in the tub and shower. Do not use towel bars as grab bars. Use non-skid mats or decals in the tub or shower. If you need to sit down in the shower, use a plastic, non-slip stool. Keep the floor dry. Clean up any water that spills on the floor as soon  as it happens. Remove soap buildup in the tub or shower regularly. Attach bath mats securely with double-sided non-slip rug tape. Do not have throw rugs and other things on the floor that can make you trip. What can I do in the bedroom? Use night lights. Make sure that you have a light by your bed that is easy to reach. Do not use any sheets or blankets that are too big for your bed. They should not hang down onto the floor. Have a firm chair that has side arms. You can use this for support while you get dressed. Do not have throw rugs and other things on the floor that can make you trip. What can I do in the kitchen? Clean up any spills right away. Avoid walking on wet floors. Keep items that you use a lot in easy-to-reach places. If you need to reach something above you, use a strong step stool that has a grab bar. Keep electrical cords out of the way. Do not use floor polish or wax that makes floors slippery. If you must use wax, use non-skid floor wax. Do not have throw rugs and other things on the floor that can make  you trip. What can I do with my stairs? Do not leave any items on the stairs. Make sure that there are handrails on both sides of the stairs and use them. Fix handrails that are broken or loose. Make sure that handrails are as long as the stairways. Check any carpeting to make sure that it is firmly attached to the stairs. Fix any carpet that is loose or worn. Avoid having throw rugs at the top or bottom of the stairs. If you do have throw rugs, attach them to the floor with carpet tape. Make sure that you have a light switch at the top of the stairs and the bottom of the stairs. If you do not have them, ask someone to add them for you. What else can I do to help prevent falls? Wear shoes that: Do not have high heels. Have rubber bottoms. Are comfortable and fit you well. Are closed at the toe. Do not wear sandals. If you use a stepladder: Make sure that it is fully  opened. Do not climb a closed stepladder. Make sure that both sides of the stepladder are locked into place. Ask someone to hold it for you, if possible. Clearly mark and make sure that you can see: Any grab bars or handrails. First and last steps. Where the edge of each step is. Use tools that help you move around (mobility aids) if they are needed. These include: Canes. Walkers. Scooters. Crutches. Turn on the lights when you go into a dark area. Replace any light bulbs as soon as they burn out. Set up your furniture so you have a clear path. Avoid moving your furniture around. If any of your floors are uneven, fix them. If there are any pets around you, be aware of where they are. Review your medicines with your doctor. Some medicines can make you feel dizzy. This can increase your chance of falling. Ask your doctor what other things that you can do to help prevent falls. This information is not intended to replace advice given to you by your health care provider. Make sure you discuss any questions you have with your health care provider. Document Released: 01/20/2009 Document Revised: 09/01/2015 Document Reviewed: 04/30/2014 Elsevier Interactive Patient Education  2017 [employer].

## 2022-03-06 NOTE — Progress Notes (Signed)
Subjective:   Cody Willis is a 81 y.o. male who presents for Medicare Annual/Subsequent preventive examination.  Review of Systems    No ROS.  Medicare Wellness Virtual Visit.  Visual/audio telehealth visit, UTA vital signs.   See social history for additional risk factors.   Cardiac Risk Factors include: advanced age (>38mn, >>20women);male gender;hypertension     Objective:    Today's Vitals   03/06/22 1332  Weight: 163 lb (73.9 kg)  Height: '5\' 6"'$  (1.676 m)   Body mass index is 26.31 kg/m.     03/06/2022    1:33 PM 06/27/2021    7:48 AM 06/16/2021   12:21 PM 10/20/2020    2:58 PM 08/19/2020    8:30 AM 07/26/2015    1:08 PM 02/08/2015   11:01 AM  Advanced Directives  Does Patient Have a Medical Advance Directive? No No No No No No No  Would patient like information on creating a medical advance directive? No - Patient declined No - Patient declined No - Patient declined Yes (MAU/Ambulatory/Procedural Areas - Information given)  Yes - Educational materials given     Current Medications (verified) Outpatient Encounter Medications as of 03/06/2022  Medication Sig   albuterol (VENTOLIN HFA) 108 (90 Base) MCG/ACT inhaler Inhale 1-2 puffs into the lungs every 4 (four) hours as needed for wheezing or shortness of breath.   aspirin EC 81 MG tablet Take 1 tablet (81 mg total) by mouth daily. Swallow whole.   cyanocobalamin (VITAMIN B12) 1000 MCG/ML injection INJECT 1ML INTO THE MUSCLE EVERY 30 DAYS. PHARMACY-PLEASE PROVIDE APPROPRIATE NEEDLES AND SYRINGES   famotidine (PEPCID) 20 MG tablet Take 1 tablet (20 mg total) by mouth at bedtime.   fluorouracil (EFUDEX) 5 % cream Apply to the forehead BID x 1 week.   ibuprofen (ADVIL) 600 MG tablet Take 1 tablet (600 mg total) by mouth 3 (three) times daily.   ketoconazole (NIZORAL) 2 % shampoo apply three times per week, massage into scalp and leave in for 10 minutes before rinsing out   Pancrelipase, Lip-Prot-Amyl, ([name])  25000-79000 units CPEP Take 2 capsules by mouth with breakfast, with lunch, and with evening meal.   pantoprazole (PROTONIX) 40 MG tablet Take 1 tablet (40 mg total) by mouth every morning.   predniSONE (DELTASONE) 20 MG tablet Take 2 tablets (40 mg total) by mouth daily with breakfast.   rosuvastatin (CRESTOR) 20 MG tablet Take 1 tablet (20 mg total) by mouth daily. Please call [phone] schedule an overdue appointment with Dr. MCandee Furbishfor future refills. Thank you. 2nd attempt.   traMADol (ULTRAM) 50 MG tablet Take 1 tablet (50 mg total) by mouth every 6 (six) hours as needed. (Patient not taking: Reported on 03/06/2022)   triamcinolone (KENALOG) 0.025 % ointment daily as needed for rash.   [DISCONTINUED] amoxicillin-clavulanate (AUGMENTIN) 875-125 MG tablet Take 1 tablet by mouth every 12 (twelve) hours.   No facility-administered encounter medications on file as of 03/06/2022.    Allergies (verified) Meloxicam, Plavix [clopidogrel bisulfate], and Sulfamethoxazole-trimethoprim   History: Past Medical History:  Diagnosis Date   Arthritis    fingers   Basal cell carcinoma 02/10/2008   left top of shoulder   Carpal tunnel syndrome    Cataract    bilateral surgery repair   Chronic pancreatitis ([facility]    COPD (chronic obstructive pulmonary disease) (HCC)    COVID-19    Esophageal reflux    Gastric polyps    hyperplastic   Herpes zoster  Hiatal hernia    Other B-complex deficiencies    Pure hypercholesterolemia    Stroke ([place]) 01/16/2015   only on ASA   Past Surgical History:  Procedure Laterality Date   APPENDECTOMY     BACK SURGERY     Diskectomy between L4 and L5   CARPAL TUNNEL RELEASE Left 06/27/2021   Procedure: CARPAL TUNNEL RELEASE LEFT;  Surgeon: Daryll Brod, MD;  Location: [facility];  Service: Orthopedics;  Laterality: Left;  Regional with monitored anesthesia care   CATARACT EXTRACTION W/ INTRAOCULAR LENS  IMPLANT, BILATERAL  12/15 and  2/16   CHOLECYSTECTOMY     COLONOSCOPY  2011   MELANOMA EXCISION  ~2009   right upper abdomen   Family History  Problem Relation Age of Onset   Stroke Mother    Diabetes Mother    Emphysema Father        smoker   Crohn's disease Sister    Colitis Brother    Colon cancer Neg Hx    Colon polyps Neg Hx    Esophageal cancer Neg Hx    Rectal cancer Neg Hx    Stomach cancer Neg Hx    Social History   Socioeconomic History   Marital status: Married    Spouse name: [name]   Number of children: 2   Years of education: high school, some college   Highest education level: Not on file  Occupational History   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  Tobacco Use   Smoking status: Former    Types: Cigarettes   Smokeless tobacco: Never  Vaping Use   Vaping Use: Never used  Substance and Sexual Activity   Alcohol use: No    Alcohol/week: 0.0 standard drinks of alcohol   Drug use: Never   Sexual activity: Not on file  Other Topics Concern   Not on file  Social History Narrative   No living will   Wife should make decisions for him if needed   Would accept resuscitation attempts   Not sure about tube feeds   Lives [name]   2 children and 2 step children - some children live nearby   Has grandchildren and step grandchildren - 78 total   Enjoys: work   Work - Administrator, has to stay in good shape for that   Social Determinants of Radio broadcast assistant Strain: [place]  (03/06/2022)   Overall Financial Resource Strain (CARDIA)    Difficulty of Paying Living Expenses: Not hard at all  Food Insecurity: No Food Insecurity (03/06/2022)   Hunger Vital Sign    Worried About Running Out of Food in the Last Year: Never true    [place] in the Last Year: Never true  Transportation Needs: No Transportation Needs (03/06/2022)   PRAPARE - Hydrologist (Medical): No    Lack of Transportation (Non-Medical): No  Physical Activity:  Sufficiently Active (03/06/2022)   Exercise Vital Sign    Days of Exercise per Week: 5 days    Minutes of Exercise per Session: 30 min  Stress: No Stress Concern Present (03/06/2022)   [place]    Feeling of Stress : Not at all  Social Connections: Unknown (03/06/2022)   Social Connection and Isolation Panel [NHANES]    Frequency of Communication with Friends and Family: Not on file    Frequency of Social Gatherings with Friends  and Family: Not on file    Attends Religious Services: Not on file    Active Member of Clubs or Organizations: Not on file    Attends Club or Organization Meetings: Not on file    Marital Status: Married    Tobacco Counseling Counseling given: Not Answered   Clinical Intake:  Pre-visit preparation completed: Yes        Diabetes: No  How often do you need to have someone help you when you read instructions, pamphlets, or other written materials from your doctor or pharmacy?: 1 - Never    Interpreter Needed?: No      Activities of Daily Living    03/06/2022    1:38 PM 06/27/2021    7:51 AM  In your present state of health, do you have any difficulty performing the following activities:  Hearing? 0 0  Vision? 0 0  Difficulty concentrating or making decisions? 0 0  Walking or climbing stairs? 0 0  Dressing or bathing? 0 0  Doing errands, shopping? 0   Preparing Food and eating ? N   Using the Toilet? N   In the past six months, have you accidently leaked urine? N   Do you have problems with loss of bowel control? N   Managing your Medications? N   Managing your Finances? N   Housekeeping or managing your Housekeeping? N     Patient Care Team: Leone Haven, MD as PCP - General (Family Medicine) Brand Males, MD as Consulting Physician (Pulmonary Disease)  Indicate any recent Medical Services you may have received from other than [facility] providers in the past  year (date may be approximate).     Assessment:   This is a routine wellness examination for [place].  I connected with  [place] on 03/06/22 by a audio enabled telemedicine application and verified that I am speaking with the correct person using two identifiers.  Patient Location: Home  Provider Location: Office/Clinic  I discussed the limitations of evaluation and management by telemedicine. The patient expressed understanding and agreed to proceed.   Hearing/Vision screen Hearing Screening - Comments:: Patient is able to hear conversational tones without difficulty.  No issues reported.   Vision Screening - Comments:: Followed by [facility] Bilateral cataracts, extracted  Dietary issues and exercise activities discussed: Current Exercise Habits: Home exercise routine, Type of exercise: stretching;walking, Time (Minutes): 30, Frequency (Times/Week): 5, Weekly Exercise (Minutes/Week): 150, Intensity: Mild   Goals Addressed             This Visit's Progress    Exercise 4x per week (30 min per time)   On track      Depression Screen    03/06/2022    1:36 PM 01/12/2022    9:52 AM 10/20/2020    2:45 PM 08/20/2019    9:50 AM 05/29/2018   10:58 AM 05/07/2017    8:52 AM 07/26/2015    1:06 PM  PHQ 2/9 Scores  PHQ - 2 Score 0 0 0 0 0 0 0  PHQ- 9 Score     0      Fall Risk    03/06/2022    1:37 PM 01/12/2022    9:52 AM 10/20/2020    2:59 PM 10/20/2020    2:38 PM 08/20/2019    9:50 AM  Fall Risk   Falls in the past year? 0 0 0 0 0  Number falls in past yr: 0 0 0 0 0  Injury with Fall?  0 0 0  0  Risk for fall due to : No Fall Risks No Fall Risks No Fall Risks    Follow up Falls evaluation completed Falls evaluation completed Falls evaluation completed      [place]: Home free of loose throw rugs in walkways, pet beds, electrical cords, etc? Yes  Adequate lighting in your home to reduce risk of falls? Yes    ASSISTIVE DEVICES UTILIZED TO PREVENT FALLS: Life alert? No  Use of a cane, walker or w/c? No   TIMED UP AND GO: Was the test performed? No .   Cognitive Function:        03/06/2022    1:40 PM  6CIT Screen  What Year? 0 points  What month? 0 points  What time? 0 points  Count back from 20 0 points  Months in reverse 0 points  Repeat phrase 0 points  Total Score 0 points    Immunizations Immunization History  Administered Date(s) Administered   PFIZER(Purple Top)SARS-COV-2 Vaccination 06/27/2019, 07/18/2019, 02/22/2020   Pneumococcal Conjugate-13 03/09/2014   Pneumococcal Polysaccharide-23 04/30/2015   Td 04/09/2009   Screening Tests Health Maintenance  Topic Date Due   COVID-19 Vaccine (4 - 2023-24 season) 03/22/2022 (Originally 12/08/2021)   Zoster Vaccines- Shingrix (1 of 2) 04/14/2022 (Originally 05/31/1959)   INFLUENZA VACCINE  07/08/2022 (Originally 11/07/2021)   Lung Cancer Screening  07/09/2022 (Originally 02/10/2022)   Medicare Annual Wellness (AWV)  03/07/2023   Pneumonia Vaccine 70+ Years old  Completed   HPV VACCINES  Aged Out   Health Maintenance There are no preventive care reminders to display for this patient.  Lung Cancer Screening: deferred scheduling per preference.    Vision Screening: Recommended annual ophthalmology exams for early detection of glaucoma and other disorders of the eye.  Dental Screening: Recommended annual dental exams for proper oral hygiene.  Community Resource Referral / Chronic Care Management: CRR required this visit?  No   CCM required this visit?  No      Plan:     I have personally reviewed and noted the following in the patient's chart:   Medical and social history Use of alcohol, tobacco or illicit drugs  Current medications and supplements including opioid prescriptions. Patient is not currently taking opioid prescriptions. Functional ability and status Nutritional status Physical activity Advanced  directives List of other physicians Hospitalizations, surgeries, and ER visits in previous 12 months Vitals Screenings to include cognitive, depression, and falls Referrals and appointments  In addition, I have reviewed and discussed with patient certain preventive protocols, quality metrics, and best practice recommendations. A written personalized care plan for preventive services as well as general preventive health recommendations were provided to patient.     Leta Jungling, LPN   [id number]

## 2022-03-12 ENCOUNTER — Encounter: Payer: Self-pay | Admitting: Family

## 2022-03-12 ENCOUNTER — Ambulatory Visit (INDEPENDENT_AMBULATORY_CARE_PROVIDER_SITE_OTHER): Payer: PPO | Admitting: Family

## 2022-03-12 VITALS — BP 124/82 | HR 70 | Temp 97.8°F | Wt 165.6 lb

## 2022-03-12 DIAGNOSIS — R21 Rash and other nonspecific skin eruption: Secondary | ICD-10-CM | POA: Insufficient documentation

## 2022-03-12 NOTE — Patient Instructions (Addendum)
I am not seeing a significant rash on the back at this time.  I suspect based on the itching and dry skin it may be related dry skin or atopic dermatitis, also known as eczema.  I would like you to purchase over-the-counter Aveeno Eczema therapy.   Certainly if you develop fever, or rash to completely resolve, please let me know right away  Atopic Dermatitis Atopic dermatitis is a skin disorder that causes inflammation of the skin. It is marked by a red rash and itchy, dry, scaly skin. It is the most common type of eczema. Eczema is a group of skin conditions that cause the skin to become rough and swollen. This condition is generally worse during the cooler winter months and often improves during the warm summer months. Atopic dermatitis usually starts showing signs in infancy and can last through adulthood. This condition cannot be passed from one person to another (is not contagious). Atopic dermatitis may not always be present, but when it is, it is called a flare-up. What are the causes? The exact cause of this condition is not known. Flare-ups may be triggered by: Coming in contact with something that you are sensitive or allergic to (allergen). Stress. Certain foods. Extremely hot or cold weather. Harsh chemicals and soaps. Dry air. Chlorine. What increases the risk? This condition is more likely to develop in people who have a personal or family history of: Eczema. Allergies. Asthma. Hay fever. What are the signs or symptoms? Symptoms of this condition include: Dry, scaly skin. Red, itchy rash. Itchiness, which can be severe. This may occur before the skin rash. This can make sleeping difficult. Skin thickening and cracking that can occur over time. How is this diagnosed? This condition is diagnosed based on: Your symptoms. Your medical history. A physical exam. How is this treated? There is no cure for this condition, but symptoms can usually be controlled. Treatment  focuses on: Controlling the itchiness and scratching. You may be given medicines, such as antihistamines or steroid creams. Limiting exposure to allergens. Recognizing situations that cause stress and developing a plan to manage stress. If your atopic dermatitis does not get better with medicines, or if it is all over your body (widespread), a treatment using a specific type of light (phototherapy) may be used. Follow these instructions at home: Skin care  Keep your skin well moisturized. Doing this seals in moisture and helps to prevent dryness. Use unscented lotions that have petroleum in them. Avoid lotions that contain alcohol or water. They can dry the skin. Keep baths or showers short (less than 5 minutes) in warm water. Do not use hot water. Use mild, unscented cleansers for bathing. Avoid soap and bubble bath. Apply a moisturizer to your skin right after a bath or shower. Do not apply anything to your skin without checking with your health care provider. General instructions Take or apply over-the-counter and prescription medicines only as told by your health care provider. Dress in clothes made of cotton or cotton blends. Dress lightly because heat increases itchiness. When washing your clothes, rinse your clothes twice so all of the soap is removed. Avoid any triggers that can cause a flare-up. Keep your fingernails cut short. Avoid scratching. Scratching makes the rash and itchiness worse. A break in the skin from scratching could result in a skin infection (impetigo). Do not be around people who have cold sores or fever blisters. If you get the infection, it may cause your atopic dermatitis to worsen. Keep all  follow-up visits. This is important. Contact a health care provider if: Your itchiness interferes with sleep. Your rash gets worse or is not better within one week of starting treatment. You have a fever. You have a rash flare-up after having contact with someone who has  cold sores or fever blisters. Get help right away if: You develop pus or soft yellow scabs in the rash area. Summary Atopic dermatitis causes a red rash and itchy, dry, scaly skin. Treatment focuses on controlling the itchiness and scratching, limiting exposure to things that you are sensitive or allergic to (allergens), recognizing situations that cause stress, and developing a plan to manage stress. Keep your skin well moisturized. Keep baths or showers shorter than 5 minutes and use warm water. Do not use hot water. This information is not intended to replace advice given to you by your health care provider. Make sure you discuss any questions you have with your health care provider. Document Revised: 01/04/2020 Document Reviewed: 01/04/2020 Elsevier Patient Education  [employer].

## 2022-03-12 NOTE — Progress Notes (Signed)
Subjective:    Patient ID: Cody Willis, male    DOB: [birth year], 81 y.o.   MRN: [id number]  CC: Cody Willis is a 81 y.o. male who presents today for an acute visit.    HPI: Red bumps on mid upper back x 10 days.  Difficult for him to see and his wife has been applying medication he thinks was prescribed previously by his dermatologist.  He does not recall name.  Rash is not painful.  He has not observed any blisters.  No fevers or chills.  Otherwise he feels well today. No new medication.  No lesions on hands, feet or in his mouth.     H/o melanoma on his stomach. Follows Dr Nehemiah Massed  HISTORY:  Past Medical History:  Diagnosis Date   Arthritis    fingers   Basal cell carcinoma 02/10/2008   left top of shoulder   Carpal tunnel syndrome    Cataract    bilateral surgery repair   Chronic pancreatitis ([place])    COPD (chronic obstructive pulmonary disease) ([place])    COVID-19    Esophageal reflux    Gastric polyps    hyperplastic   Herpes zoster    Hiatal hernia    Other B-complex deficiencies    Pure hypercholesterolemia    Stroke ([place]) 01/16/2015   only on ASA   Past Surgical History:  Procedure Laterality Date   APPENDECTOMY     BACK SURGERY     Diskectomy between L4 and L5   CARPAL TUNNEL RELEASE Left 06/27/2021   Procedure: CARPAL TUNNEL RELEASE LEFT;  Surgeon: Daryll Brod, MD;  Location: [facility];  Service: Orthopedics;  Laterality: Left;  Regional with monitored anesthesia care   CATARACT EXTRACTION W/ INTRAOCULAR LENS  IMPLANT, BILATERAL  12/15 and 2/16   CHOLECYSTECTOMY     COLONOSCOPY  2011   MELANOMA EXCISION  ~2009   right upper abdomen   Family History  Problem Relation Age of Onset   Stroke Mother    Diabetes Mother    Emphysema Father        smoker   Crohn's disease Sister    Colitis Brother    Colon cancer Neg Hx    Colon polyps Neg Hx    Esophageal cancer Neg Hx    Rectal cancer Neg Hx    Stomach cancer Neg Hx      Allergies: Meloxicam, Plavix [clopidogrel bisulfate], and Sulfamethoxazole-trimethoprim Current Outpatient Medications on File Prior to Visit  Medication Sig Dispense Refill   albuterol (VENTOLIN HFA) 108 (90 Base) MCG/ACT inhaler Inhale 1-2 puffs into the lungs every 4 (four) hours as needed for wheezing or shortness of breath. 1 each 0   aspirin EC 81 MG tablet Take 1 tablet (81 mg total) by mouth daily. Swallow whole. 90 tablet 3   cyanocobalamin (VITAMIN B12) 1000 MCG/ML injection INJECT 1ML INTO THE MUSCLE EVERY 30 DAYS. PHARMACY-PLEASE PROVIDE APPROPRIATE NEEDLES AND SYRINGES 3 mL 3   fluorouracil (EFUDEX) 5 % cream Apply to the forehead BID x 1 week. 30 g 0   ketoconazole (NIZORAL) 2 % shampoo apply three times per week, massage into scalp and leave in for 10 minutes before rinsing out 120 mL 6   Pancrelipase, Lip-Prot-Amyl, ([name]) 25000-79000 units CPEP Take 2 capsules by mouth with breakfast, with lunch, and with evening meal. 540 capsule 3   pantoprazole (PROTONIX) 40 MG tablet Take 1 tablet (40 mg total) by mouth every morning. 90 tablet 3  predniSONE (DELTASONE) 20 MG tablet Take 2 tablets (40 mg total) by mouth daily with breakfast. 10 tablet 0   rosuvastatin (CRESTOR) 20 MG tablet Take 1 tablet (20 mg total) by mouth daily. Please call [phone] to schedule an overdue appointment with Dr. Candee Furbish for future refills. Thank you. 2nd attempt. 90 tablet 3   triamcinolone (KENALOG) 0.025 % ointment daily as needed for rash.     famotidine (PEPCID) 20 MG tablet Take 1 tablet (20 mg total) by mouth at bedtime. (Patient not taking: Reported on 03/12/2022) 30 tablet 1   ibuprofen (ADVIL) 600 MG tablet Take 1 tablet (600 mg total) by mouth 3 (three) times daily. (Patient not taking: Reported on 03/12/2022) 30 tablet 0   traMADol (ULTRAM) 50 MG tablet Take 1 tablet (50 mg total) by mouth every 6 (six) hours as needed. (Patient not taking: Reported on 03/12/2022) 20 tablet 0   No  current facility-administered medications on file prior to visit.    Social History   Tobacco Use   Smoking status: Former    Types: Cigarettes   Smokeless tobacco: Never  Vaping Use   Vaping Use: Never used  Substance Use Topics   Alcohol use: No    Alcohol/week: 0.0 standard drinks of alcohol   Drug use: Never    Review of Systems  Constitutional:  Negative for chills and fever.  Respiratory:  Negative for cough.   Cardiovascular:  Negative for chest pain and palpitations.  Gastrointestinal:  Negative for nausea and vomiting.  Skin:  Positive for rash.      Objective:    BP 124/82   Pulse 70   Temp 97.8 F (36.6 C) (Oral)   Wt 165 lb 9.6 oz (75.1 kg)   SpO2 97%   BMI 26.73 kg/m    Physical Exam Vitals reviewed.  Constitutional:      Appearance: He is well-developed.  Cardiovascular:     Rate and Rhythm: Regular rhythm.     Heart sounds: Normal heart sounds.  Pulmonary:     Effort: Pulmonary effort is normal. No respiratory distress.     Breath sounds: Normal breath sounds. No wheezing, rhonchi or rales.  Skin:    General: Skin is warm and dry.     Comments: Dry flaky scaling present mid upper back where patient feels itching.  No vesicular lesion, papular lesions, or erythema.    scattered seborrheic keratoses present.   Neurological:     Mental Status: He is alert.  Psychiatric:        Speech: Speech normal.        Behavior: Behavior normal.        Assessment & Plan:   Problem List Items Addressed This Visit       Musculoskeletal and Integument   Rash - Primary    Fortunately patient well-appearing and rash has largely resolved at this point.  Discussed with patient my suspicion itching r/t  dry skin and/or atopic dermatitis.  Encouraged him to pick up over-the-counter Aveeno eczema therapy.  He will stay vigilant and let me know if rash is not resolved         I am having Cody Willis maintain his triamcinolone, aspirin EC,  albuterol, traMADol, cyanocobalamin, pantoprazole, [name], famotidine, ibuprofen, fluorouracil, ketoconazole, predniSONE, and rosuvastatin.   No orders of the defined types were placed in this encounter.   Return precautions given.   Risks, benefits, and alternatives of the medications and treatment plan prescribed today were discussed,  and patient expressed understanding.   Education regarding symptom management and diagnosis given to patient on AVS.  Continue to follow with Leone Haven, MD for routine health maintenance.   [place] and I agreed with plan.   Mable Paris, FNP

## 2022-03-12 NOTE — Assessment & Plan Note (Signed)
Fortunately patient well-appearing and rash has largely resolved at this point.  Discussed with patient my suspicion itching r/t  dry skin and/or atopic dermatitis.  Encouraged him to pick up over-the-counter Aveeno eczema therapy.  He will stay vigilant and let me know if rash is not resolved

## 2022-03-13 ENCOUNTER — Telehealth: Payer: Self-pay

## 2022-03-13 NOTE — Telephone Encounter (Signed)
   Name: [name]  DOB: 1941/03/10  MRN: [id number]  Primary Cardiologist: Candee Furbish, MD  Chart reviewed as part of pre-operative protocol coverage. Because of Cody Willis's past medical history and time since last visit, he will require a follow-up in-office visit in order to better assess preoperative cardiovascular risk. Last OV >1 year ago in 01/2021.  Pre-op covering staff: - Please schedule appointment and call patient to inform them. If patient already had an upcoming appointment within acceptable timeframe, please add "pre-op clearance" to the appointment notes so provider is aware. - Please contact requesting surgeon's office via preferred method (i.e, phone, fax) to inform them of need for appointment prior to surgery.  Re: ASA: had nonobstructive CAD by cor CT 02/2021 and mild carotid disease by duplex 02/2021 therefore no cardiac contraindication to holding if stable at follow-up, but appears pt is on for hx of stroke so would suggest at visit to also get clearance from PCP to hold.  Charlie Pitter, PA-C  03/13/2022, 3:59 PM

## 2022-03-13 NOTE — Telephone Encounter (Signed)
...     Pre-operative Risk Assessment    Patient Name: Cody Willis  DOB: Apr 26, 1940 MRN: [id number]      Request for Surgical Clearance    Procedure:   RIGHT PARTIAL KNEE REPLACEMENT  Date of Surgery:  Clearance TBD                                 Surgeon:  DR Edmonia Lynch Surgeon's Group or Practice Name:  [facility] Phone number:  [phone] Fax number:  [phone]   Type of Clearance Requested:   - Medical  - Pharmacy:  Hold Aspirin     Type of Anesthesia:  Spinal   Additional requests/questions:    Gwenlyn Found   03/13/2022, 2:12 PM

## 2022-03-14 NOTE — Telephone Encounter (Signed)
Pt has been scheduled to see Dr. Marlou Porch 03/16/22 @ 10 am. Pt thanked me for the call and the help.

## 2022-03-16 ENCOUNTER — Ambulatory Visit: Payer: PPO | Attending: Cardiology | Admitting: Cardiology

## 2022-03-16 ENCOUNTER — Encounter: Payer: Self-pay | Admitting: Cardiology

## 2022-03-16 VITALS — BP 120/70 | HR 70 | Ht 66.0 in | Wt 165.0 lb

## 2022-03-16 DIAGNOSIS — I2510 Atherosclerotic heart disease of native coronary artery without angina pectoris: Secondary | ICD-10-CM | POA: Diagnosis not present

## 2022-03-16 DIAGNOSIS — I700 Atherosclerosis of aorta: Secondary | ICD-10-CM

## 2022-03-16 DIAGNOSIS — Z01810 Encounter for preprocedural cardiovascular examination: Secondary | ICD-10-CM | POA: Diagnosis not present

## 2022-03-16 DIAGNOSIS — Z8673 Personal history of transient ischemic attack (TIA), and cerebral infarction without residual deficits: Secondary | ICD-10-CM

## 2022-03-16 DIAGNOSIS — I10 Essential (primary) hypertension: Secondary | ICD-10-CM | POA: Diagnosis not present

## 2022-03-16 NOTE — Patient Instructions (Signed)
Medication Instructions:  The current medical regimen is effective;  continue present plan and medications.  *If you need a refill on your cardiac medications before your next appointment, please call your pharmacy*  Follow-Up: At [facility], you and your health needs are our priority.  As part of our continuing mission to provide you with exceptional heart care, we have created designated Provider Care Teams.  These Care Teams include your primary Cardiologist (physician) and Advanced Practice Providers (APPs -  Physician Assistants and Nurse Practitioners) who all work together to provide you with the care you need, when you need it.  We recommend signing up for the patient portal called "MyChart".  Sign up information is provided on this After Visit Summary.  MyChart is used to connect with patients for Virtual Visits (Telemedicine).  Patients are able to view lab/test results, encounter notes, upcoming appointments, etc.  Non-urgent messages can be sent to your provider as well.   To learn more about what you can do with MyChart, go to [email or web address].    Your next appointment:   1 year(s)  The format for your next appointment:   In Person  Provider:   Mark Skains, MD      Important Information About Sugar       

## 2022-03-16 NOTE — Progress Notes (Signed)
Cardiology Office Note:    Date:  03/16/2022   ID:  CHOSEN GESKE, DOB 01-23-1941, MRN [id number]  PCP:  Leone Haven, MD   [facility] Providers Cardiologist:  Candee Furbish, MD     Referring MD: Leone Haven, MD    History of Present Illness:    Cody Willis is a 81 y.o. male here for preoperative risk evaluation prior to knee surgery with Dr. Caprice Kluver.    He has nonobstructive coronary artery disease on coronary CT scan as described below.  He is on high intensity statin therapy.  Doing well.  His right knee has been bothering him for quite some time and now the cartilage is gone.  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  He denies any chest pain fevers chills nausea vomiting syncope bleeding.  Did have his wrist operated on in the past by Dr. Percell Miller has done very well.  He does get easy bruising at times.  Attributes this to old age.  Review of prior office visit from 10/28/2020, pulmonary clinic, he is a former smoker quit in 2016 after 50-pack-year history and has COPD hypertension chronic pancreatitis acid reflux B12 deficiency history of stroke hyperlipidemia melanoma and prediabetes.  He was having dyspnea on exertion.  His FEV1 was 65%.  Imaging of the lungs in October 21, 2020 showed no evidence of interstitial lung disease.  Mild emphysema.  He did have aortic atherosclerosis in addition to left main and three-vessel coronary artery disease/calcification.  Reviewed CT scan personally and interpreted the aortic atherosclerosis as well as triple-vessel coronary artery disease findings.  Occasionally has sharp left sided chest pain.  Moderate induration lasting few minutes duration off and on.  Coronary CT was overall reassuring.  Brother 3 years younger had a heart problem on the back side of heart, treated medically. He died 2 years ago.   Had a heart cath, Dr. Gwenlyn Found, 30 years ago he says, 20%-30%  narrowing.     Past Medical History:  Diagnosis Date   Arthritis    fingers   Basal cell carcinoma 02/10/2008   left top of shoulder   Carpal tunnel syndrome    Cataract    bilateral surgery repair   Chronic pancreatitis ([place])    COPD (chronic obstructive pulmonary disease) ([place])    COVID-19    Esophageal reflux    Gastric polyps    hyperplastic   Herpes zoster    Hiatal hernia    Other B-complex deficiencies    Pure hypercholesterolemia    Stroke ([place]) 01/16/2015   only on ASA    Past Surgical History:  Procedure Laterality Date   APPENDECTOMY     BACK SURGERY     Diskectomy between L4 and L5   CARPAL TUNNEL RELEASE Left 06/27/2021   Procedure: CARPAL TUNNEL RELEASE LEFT;  Surgeon: Daryll Brod, MD;  Location: [place];  Service: Orthopedics;  Laterality: Left;  Regional with monitored anesthesia care   CATARACT EXTRACTION W/ INTRAOCULAR LENS  IMPLANT, BILATERAL  12/15 and 2/16   CHOLECYSTECTOMY     COLONOSCOPY  2011   MELANOMA EXCISION  ~2009   right upper abdomen    Current Medications: Current Meds  Medication Sig   albuterol (VENTOLIN HFA) 108 (90 Base) MCG/ACT inhaler Inhale 1-2 puffs into the lungs every 4 (four) hours as needed for wheezing or shortness of breath.   aspirin EC 81  MG tablet Take 1 tablet (81 mg total) by mouth daily. Swallow whole.   cyanocobalamin (VITAMIN B12) 1000 MCG/ML injection INJECT 1ML INTO THE MUSCLE EVERY 30 DAYS. PHARMACY-PLEASE PROVIDE APPROPRIATE NEEDLES AND SYRINGES   fluorouracil (EFUDEX) 5 % cream Apply to the forehead BID x 1 week.   ketoconazole (NIZORAL) 2 % shampoo apply three times per week, massage into scalp and leave in for 10 minutes before rinsing out   Pancrelipase, Lip-Prot-Amyl, ([name]) 25000-79000 units CPEP Take 2 capsules by mouth with breakfast, with lunch, and with evening meal.   pantoprazole (PROTONIX) 40 MG tablet Take 1 tablet (40 mg total) by mouth every morning.   rosuvastatin (CRESTOR)  20 MG tablet Take 1 tablet (20 mg total) by mouth daily. Please call [phone] to schedule an overdue appointment with Dr. Candee Furbish for future refills. Thank you. 2nd attempt.   triamcinolone (KENALOG) 0.025 % ointment daily as needed for rash.     Allergies:   Meloxicam, Plavix [clopidogrel bisulfate], and Sulfamethoxazole-trimethoprim   Social History   Socioeconomic History   Marital status: Married    Spouse name: [name]   Number of children: 2   Years of education: high school, some college   Highest education level: Not on file  Occupational History   Occupation: Truck driver-- local    Comment: part time  Tobacco Use   Smoking status: Former    Types: Cigarettes   Smokeless tobacco: Never  Scientific laboratory technician Use: Never used  Substance and Sexual Activity   Alcohol use: No    Alcohol/week: 0.0 standard drinks of alcohol   Drug use: Never   Sexual activity: Not on file  Other Topics Concern   Not on file  Social History Narrative   No living will   Wife should make decisions for him if needed   Would accept resuscitation attempts   Not sure about tube feeds   Lives [name]   2 children and 2 step children - some children live nearby   Has grandchildren and step grandchildren - 51 total   Enjoys: work   Work - Administrator, has to stay in good shape for that   Social Determinants of Radio broadcast assistant Strain: [place]  (03/06/2022)   Overall Financial Resource Strain (CARDIA)    Difficulty of Paying Living Expenses: Not hard at all  Food Insecurity: No Food Insecurity (03/06/2022)   Hunger Vital Sign    Worried About Running Out of Food in the Last Year: Never true    Worth in the Last Year: Never true  Transportation Needs: No Transportation Needs (03/06/2022)   PRAPARE - Hydrologist (Medical): No    Lack of Transportation (Non-Medical): No  Physical Activity: Sufficiently Active (03/06/2022)   Exercise  Vital Sign    Days of Exercise per Week: 5 days    Minutes of Exercise per Session: 30 min  Stress: No Stress Concern Present (03/06/2022)   [place]    Feeling of Stress : Not at all  Social Connections: Unknown (03/06/2022)   Social Connection and Isolation Panel [NHANES]    Frequency of Communication with Friends and Family: Not on file    Frequency of Social Gatherings with Friends and Family: Not on file    Attends Religious Services: Not on file    Active Member of Clubs or Organizations: Not on file  Attends Archivist Meetings: Not on file    Marital Status: Married     Family History: The patient's family history includes Colitis in his brother; Crohn's disease in his sister; Diabetes in his mother; Emphysema in his father; Stroke in his mother. There is no history of Colon cancer, Colon polyps, Esophageal cancer, Rectal cancer, or Stomach cancer.  ROS:   Please see the history of present illness.    No fevers chills nausea vomiting syncope bleeding all other systems reviewed and are negative.  EKGs/Labs/Other Studies Reviewed:    The following studies were reviewed today:  CT scan of chest 10/21/2020-triple-vessel CAD, aortic atherosclerosis  Coronary CT scan 02/10/2021:  Calcium score: LM and 3 vessel calcium noted   Coronary Arteries: Right dominant with no anomalies   LM: 1-24% calcific plaque   LAD: 25-49% calcified plaque in proximal and mid vessel   D1: Normal   D2: Normal   Circumflex: 1-24% mid vessel calcific plaque   OM1: Normal   OM2: Normal   RCA: 1-24% calcified plaque in proximal and mid vessel   PDA: Normal   PLA: Normal   IMPRESSION: 1. Calcium score 253 which is 42 nd percentile for age/sex   2.  Normal ascending aortic root 3.5 cm   3.  CAD RADS 2 non obstructive CAD see description above   [place]  Echocardiogram 02/24/2021:   1. Left  ventricular ejection fraction, by estimation, is 60 to 65%. The  left ventricle has normal function. The left ventricle has no regional  wall motion abnormalities. Left ventricular diastolic parameters are  consistent with Grade I diastolic  dysfunction (impaired relaxation).   2. Right ventricular systolic function is normal. The right ventricular  size is normal. Tricuspid regurgitation signal is inadequate for assessing  PA pressure.   3. The mitral valve is normal in structure. No evidence of mitral valve  regurgitation. No evidence of mitral stenosis.   4. The aortic valve is tricuspid. Aortic valve regurgitation is not  visualized. No aortic stenosis is present.   Comparison(s): A prior study was performed on 01/17/2015. No significant  change from prior study.   EKG:  EKG is  ordered today.  The ekg ordered today demonstrates SR 60  Recent Labs: 11/16/2021: ALT 12; BUN 22; Creatinine, Ser 0.97; Hemoglobin 14.5; Platelets 162.0; Potassium 3.7; Sodium 146  Recent Lipid Panel    Component Value Date/Time   CHOL 114 06/26/2021 0924   TRIG 107.0 06/26/2021 0924   HDL 45.50 06/26/2021 0924   CHOLHDL 3 06/26/2021 0924   VLDL 21.4 06/26/2021 0924   LDLCALC 47 06/26/2021 0924   LDLDIRECT 76.0 10/20/2020 1515     Risk Assessment/Calculations:          Physical Exam:    VS:  BP 120/70 (BP Location: Right Leg, Patient Position: Sitting, Cuff Size: Normal)   Pulse 70   Ht '5\' 6"'$  (1.676 m)   Wt 165 lb (74.8 kg)   BMI 26.63 kg/m     Wt Readings from Last 3 Encounters:  03/16/22 165 lb (74.8 kg)  03/12/22 165 lb 9.6 oz (75.1 kg)  03/06/22 163 lb (73.9 kg)     GEN:  Well nourished, well developed in no acute distress HEENT: Normal NECK: No JVD; No carotid bruits LYMPHATICS: No lymphadenopathy CARDIAC: RRR, no murmurs, rubs, gallops RESPIRATORY:  Clear to auscultation without rales, wheezing or rhonchi  ABDOMEN: Soft, non-tender, non-distended MUSCULOSKELETAL:  No  edema; No deformity  SKIN:  Warm and dry NEUROLOGIC:  Alert and oriented x 3 PSYCHIATRIC:  Normal affect   ASSESSMENT:    1. Coronary artery disease involving native coronary artery of native heart without angina pectoris   2. Preop cardiovascular exam   3. Aortic atherosclerosis ([place])   4. Essential hypertension   5. History of CVA (cerebrovascular accident)     PLAN:    In order of problems listed above:  Preop cardiac risk He is low overall risk from a cardiac perspective for knee surgery.  He may proceed.  He is not having any high risk symptoms such as angina or syncope.  No heart failure type symptoms.  EKG unremarkable.  Echocardiogram with normal pump function.  He does have nonobstructive coronary artery disease.  Dr. Edmonia Lynch is orthopedist.    Coronary artery disease involving native coronary artery of native heart without angina pectoris Rosanne Gutting CT scan in 2021 showed nonobstructive CAD but elevated coronary calcium score.  Aspirin 81 mg would be reasonable.  Changed lovastatin 40 mg over to Crestor 20 mg.  High intensity statin.  LDL goal less than 70.  Last check he was in the 83s.  Excellent.  Dr. Caryl Bis his primary care physician is prescribing.  Excellent.   Mixed hyperlipidemia Changed lovastatin 40 mg over to Crestor 20 mg.  High intensity statin.  LDL goal less than 70.last check 40s.   History of CVA (cerebrovascular accident) Agree with low-dose aspirin 81 mg.   LDL goal less than 70.  Last checked in the 45s.   Aortic atherosclerosis ([facility]) Statin, aspirin, blood pressure control.  Aspirin 81 mg.   Carotid bruit Checking carotid Dopplers.  He has known triple-vessel coronary artery disease.  Could have carotid artery disease as well.   Essential hypertension Previously his antihypertensive was stopped.  His blood pressures have been in the 671 systolic at times.  Keep an eye on this.  We may need to reinitiate.              Medication  Adjustments/Labs and Tests Ordered: Current medicines are reviewed at length with the patient today.  Concerns regarding medicines are outlined above.  Orders Placed This Encounter  Procedures   EKG 12-Lead    No orders of the defined types were placed in this encounter.    Patient Instructions  Medication Instructions:  The current medical regimen is effective;  continue present plan and medications.  *If you need a refill on your cardiac medications before your next appointment, please call your pharmacy*  Follow-Up: At [facility], you and your health needs are our priority.  As part of our continuing mission to provide you with exceptional heart care, we have created designated Provider Care Teams.  These Care Teams include your primary Cardiologist (physician) and Advanced Practice Providers (APPs -  Physician Assistants and Nurse Practitioners) who all work together to provide you with the care you need, when you need it.  We recommend signing up for the patient portal called "MyChart".  Sign up information is provided on this After Visit Summary.  MyChart is used to connect with patients for Virtual Visits (Telemedicine).  Patients are able to view lab/test results, encounter notes, upcoming appointments, etc.  Non-urgent messages can be sent to your provider as well.   To learn more about what you can do with MyChart, go to [email or web address].    Your next appointment:   1 year(s)  The format for your next appointment:   In Person  Provider:   Candee Furbish, MD      Important Information About Sugar         Signed, Candee Furbish, MD  03/16/2022 10:34 AM    [place]

## 2022-03-27 ENCOUNTER — Encounter: Payer: Self-pay | Admitting: *Deleted

## 2022-03-28 ENCOUNTER — Encounter: Payer: Self-pay | Admitting: Family Medicine

## 2022-03-28 ENCOUNTER — Ambulatory Visit (INDEPENDENT_AMBULATORY_CARE_PROVIDER_SITE_OTHER): Payer: PPO | Admitting: Family Medicine

## 2022-03-28 VITALS — BP 140/80 | HR 90 | Temp 97.9°F | Ht 66.0 in | Wt 165.0 lb

## 2022-03-28 DIAGNOSIS — I10 Essential (primary) hypertension: Secondary | ICD-10-CM | POA: Diagnosis not present

## 2022-03-28 DIAGNOSIS — Z8673 Personal history of transient ischemic attack (TIA), and cerebral infarction without residual deficits: Secondary | ICD-10-CM | POA: Diagnosis not present

## 2022-03-28 DIAGNOSIS — M25561 Pain in right knee: Secondary | ICD-10-CM

## 2022-03-28 MED ORDER — VALSARTAN 80 MG PO TABS: 80.0000 mg | ORAL_TABLET | Freq: Every day | ORAL | 3 refills | Status: DC

## 2022-03-28 NOTE — Assessment & Plan Note (Addendum)
Chronic issue. Patient's blood pressure is intermittently above goal.  We will have him start on valsartan 80 mg daily and return in 10 days for repeat lab work and blood pressure check.

## 2022-03-28 NOTE — Assessment & Plan Note (Signed)
Medical clearance visit today.  Patient is stable and is acceptable risk to proceed with his knee surgery.

## 2022-03-28 NOTE — Progress Notes (Signed)
Cody Rumps, MD Phone: [phone]  Cody Willis is a 81 y.o. male who presents today for f/u.  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   Edema- no BMET    Component Value Date/Time   NA 146 (H) 11/16/2021 1107   NA 142 02/02/2021 1117   NA 139 07/28/2012 1858   K 3.7 11/16/2021 1107   K 3.7 07/28/2012 1858   CL 104 11/16/2021 1107   CL 107 07/28/2012 1858   CO2 33 (H) 11/16/2021 1107   CO2 27 07/28/2012 1858   GLUCOSE 67 (L) 11/16/2021 1107   GLUCOSE 114 (H) 07/28/2012 1858   BUN 22 11/16/2021 1107   BUN 19 02/02/2021 1117   BUN 16 07/28/2012 1858   CREATININE 0.97 11/16/2021 1107   CREATININE 1.27 07/28/2012 1858   CALCIUM 9.3 11/16/2021 1107   CALCIUM 8.9 07/28/2012 1858   GFRNONAA >60 08/24/2020 1226   GFRNONAA 56 (L) 07/28/2012 1858   GFRAA >60 02/08/2015 1128   GFRAA >60 07/28/2012 1858   CAD/history of CVA: Patient reports he had a CVA in 2016.  He notes he was released by neurology in 2017.  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  He reports cardiology is already taking care of their portion of this.  Right knee pain: This is an ongoing issue.  He has seen orthopedics and they plan on doing a partial knee replacement.  He has seen cardiology for cardiac clearance.  Social History   Tobacco Use  Smoking Status Former   Types: Cigarettes  Smokeless Tobacco Never    Current Outpatient Medications on File Prior to Visit  Medication Sig Dispense Refill   albuterol (VENTOLIN HFA) 108 (90 Base) MCG/ACT inhaler Inhale 1-2 puffs into the lungs every 4 (four) hours as needed for wheezing or shortness of breath. 1 each 0   aspirin EC 81  MG tablet Take 1 tablet (81 mg total) by mouth daily. Swallow whole. 90 tablet 3   cyanocobalamin (VITAMIN B12) 1000 MCG/ML injection INJECT 1ML INTO THE MUSCLE EVERY 30 DAYS. PHARMACY-PLEASE PROVIDE APPROPRIATE NEEDLES AND SYRINGES 3 mL 3   fluorouracil (EFUDEX) 5 % cream Apply to the forehead BID x 1 week. 30 g 0   ketoconazole (NIZORAL) 2 % shampoo apply three times per week, massage into scalp and leave in for 10 minutes before rinsing out 120 mL 6   Pancrelipase, Lip-Prot-Amyl, ([name]) 25000-79000 units CPEP Take 2 capsules by mouth with breakfast, with lunch, and with evening meal. 540 capsule 3   pantoprazole (PROTONIX) 40 MG tablet Take 1 tablet (40 mg total) by mouth every morning. 90 tablet 3   rosuvastatin (CRESTOR) 20 MG tablet Take 1 tablet (20 mg total) by mouth daily. Please call [phone] to schedule an overdue appointment with Dr. Candee Furbish for future refills. Thank you. 2nd attempt. 90 tablet 3   triamcinolone (KENALOG) 0.025 % ointment daily as needed for rash.     No current facility-administered medications on file prior to visit.     ROS see history of present illness  Objective  Physical Exam Vitals:   03/28/22 1330 03/28/22 1345  BP: (!) 160/80 (!) 140/80  Pulse: 90  Temp: 97.9 F (36.6 C)   SpO2: 97%     BP Readings from Last 3 Encounters:  03/28/22 (!) 140/80  03/16/22 120/70  03/12/22 124/82   Wt Readings from Last 3 Encounters:  03/28/22 165 lb (74.8 kg)  03/16/22 165 lb (74.8 kg)  03/12/22 165 lb 9.6 oz (75.1 kg)    Physical Exam Constitutional:      General: He is not in acute distress.    Appearance: He is not diaphoretic.  Cardiovascular:     Rate and Rhythm: Normal rate and regular rhythm.     Heart sounds: Normal heart sounds.  Pulmonary:     Effort: Pulmonary effort is normal.     Breath sounds: Normal breath sounds.  Abdominal:     General: Bowel sounds are normal. There is no distension.     Palpations: Abdomen is soft.      Tenderness: There is no abdominal tenderness.  Skin:    General: Skin is warm and dry.  Neurological:     Mental Status: He is alert.      Assessment/Plan: Please see individual problem list.  Primary hypertension Assessment & Plan: Chronic issue. Patient's blood pressure is intermittently above goal.  We will have him start on valsartan 80 mg daily and return in 10 days for repeat lab work and blood pressure check.  Orders: -     Valsartan; Take 1 tablet (80 mg total) by mouth daily.  Dispense: 90 tablet; Refill: 3 -     Basic metabolic panel; Future  Acute pain of right knee Assessment & Plan: Medical clearance visit today.  Patient is stable and is acceptable risk to proceed with his knee surgery.   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  Orders: -     Ambulatory referral to Neurology     Return in about 10 days (around 04/07/2022) for Labs and nurse BP check, 3 months PCP.   Cody Rumps, MD [place]

## 2022-03-28 NOTE — Assessment & Plan Note (Signed)
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

## 2022-03-28 NOTE — Patient Instructions (Signed)
Nice to see you. We are going to start you on valsartan for your blood pressure.  You will take this once daily.  You will need to return in 10 days for labs and a blood pressure check with our nurse.  If you notice any side effects with the valsartan please let me know.

## 2022-03-30 ENCOUNTER — Ambulatory Visit: Payer: PPO | Admitting: Diagnostic Neuroimaging

## 2022-03-30 ENCOUNTER — Encounter: Payer: Self-pay | Admitting: Diagnostic Neuroimaging

## 2022-03-30 VITALS — BP 165/75 | HR 78 | Ht 66.0 in | Wt 165.8 lb

## 2022-03-30 DIAGNOSIS — Z8673 Personal history of transient ischemic attack (TIA), and cerebral infarction without residual deficits: Secondary | ICD-10-CM | POA: Diagnosis not present

## 2022-03-30 NOTE — Patient Instructions (Addendum)
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

## 2022-03-30 NOTE — Progress Notes (Signed)
[facility]  PATIENT: Cody Willis DOB: [birth year]  REFERRING CLINICIAN: Leone Haven, MD HISTORY FROM: patient REASON FOR VISIT: new consult   HISTORICAL  CHIEF COMPLAINT:  Chief Complaint  Patient presents with   Room 8    Pt is here Alone. Pt is here for Physical for the DOT.     HISTORY OF PRESENT ILLNESS:   81 year old male here for evaluation of DOT physical evaluation due to history of stroke.  October 2016 patient had acute onset of vertigo and nausea.  Patient went to the hospital for evaluation and was found to have left cerebellar ischemic infarctions.  CT of the head and neck demonstrated left vertebral artery high-grade stenosis versus occlusion.  He was treated with dual antiplatelet and statin therapy.  Smoking cessation was encouraged.  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  He has been working and passing his DOT physical since that time.  Patient went to get DOT physical recently and this time was asked to have clearance /evaluation from cardiology and neurology due to his history.  No recurrence of stroke symptoms.  He is doing well.  He does have some right knee pain and is planning to have knee surgery.  He is tolerating his medications.   REVIEW OF SYSTEMS: Full 14 system review of systems performed and negative with exception of: As per HPI.  ALLERGIES: Allergies  Allergen Reactions   Meloxicam     "Tears my stomach up"   Plavix [Clopidogrel Bisulfate] Itching   Sulfamethoxazole-Trimethoprim Rash    HOME MEDICATIONS: Outpatient Medications Prior to Visit  Medication Sig Dispense Refill   albuterol (VENTOLIN HFA) 108 (90 Base) MCG/ACT inhaler Inhale 1-2 puffs into the lungs every 4 (four) hours as needed for wheezing or shortness of breath. 1 each 0   aspirin EC 81 MG tablet Take 1 tablet (81 mg total) by mouth daily. Swallow whole. 90  tablet 3   cyanocobalamin (VITAMIN B12) 1000 MCG/ML injection INJECT 1ML INTO THE MUSCLE EVERY 30 DAYS. PHARMACY-PLEASE PROVIDE APPROPRIATE NEEDLES AND SYRINGES 3 mL 3   fluorouracil (EFUDEX) 5 % cream Apply to the forehead BID x 1 week. 30 g 0   ketoconazole (NIZORAL) 2 % shampoo apply three times per week, massage into scalp and leave in for 10 minutes before rinsing out 120 mL 6   Pancrelipase, Lip-Prot-Amyl, ([name]) 25000-79000 units CPEP Take 2 capsules by mouth with breakfast, with lunch, and with evening meal. 540 capsule 3   pantoprazole (PROTONIX) 40 MG tablet Take 1 tablet (40 mg total) by mouth every morning. 90 tablet 3   rosuvastatin (CRESTOR) 20 MG tablet Take 1 tablet (20 mg total) by mouth daily. Please call [phone] to schedule an overdue appointment with Dr. Candee Furbish for future refills. Thank you. 2nd attempt. 90 tablet 3   triamcinolone (KENALOG) 0.025 % ointment daily as needed for rash.     valsartan (DIOVAN) 80 MG tablet Take 1 tablet (80 mg total) by mouth daily. 90 tablet 3   No facility-administered medications prior to visit.    PAST MEDICAL HISTORY: Past Medical History:  Diagnosis Date   Arthritis    fingers   Basal cell carcinoma 02/10/2008   left top of shoulder   Carpal tunnel syndrome    Cataract    bilateral surgery repair   Chronic pancreatitis ([place])    COPD (chronic obstructive  pulmonary disease) ([place])    COVID-19    Esophageal reflux    Gastric polyps    hyperplastic   Herpes zoster    Hiatal hernia    Other B-complex deficiencies    Pure hypercholesterolemia    Stroke ([place]) 01/16/2015   only on ASA    PAST SURGICAL HISTORY: Past Surgical History:  Procedure Laterality Date   APPENDECTOMY     BACK SURGERY     Diskectomy between L4 and L5   CARPAL TUNNEL RELEASE Left 06/27/2021   Procedure: CARPAL TUNNEL RELEASE LEFT;  Surgeon: Daryll Brod, MD;  Location: [place];  Service: Orthopedics;  Laterality: Left;   Regional with monitored anesthesia care   CATARACT EXTRACTION W/ INTRAOCULAR LENS  IMPLANT, BILATERAL  12/15 and 2/16   CHOLECYSTECTOMY     COLONOSCOPY  2011   MELANOMA EXCISION  ~2009   right upper abdomen    FAMILY HISTORY: Family History  Problem Relation Age of Onset   Stroke Mother    Diabetes Mother    Emphysema Father        smoker   Crohn's disease Sister    Colitis Brother    Colon cancer Neg Hx    Colon polyps Neg Hx    Esophageal cancer Neg Hx    Rectal cancer Neg Hx    Stomach cancer Neg Hx     SOCIAL HISTORY: Social History   Socioeconomic History   Marital status: Married    Spouse name: [name]   Number of children: 2   Years of education: high school, some college   Highest education level: Not on file  Occupational History   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  Tobacco Use   Smoking status: Former    Types: Cigarettes   Smokeless tobacco: Never  Scientific laboratory technician Use: Never used  Substance and Sexual Activity   Alcohol use: No    Alcohol/week: 0.0 standard drinks of alcohol   Drug use: Never   Sexual activity: Not on file  Other Topics Concern   Not on file  Social History Narrative   No living will   Wife should make decisions for him if needed   Would accept resuscitation attempts   Not sure about tube feeds   Lives [name]   2 children and 2 step children - some children live nearby   Has grandchildren and step grandchildren - 34 total   Enjoys: work   Work - Administrator, has to stay in good shape for that   Social Determinants of Radio broadcast assistant Strain: [place]  (03/06/2022)   Overall Financial Resource Strain (CARDIA)    Difficulty of Paying Living Expenses: Not hard at all  Food Insecurity: No Food Insecurity (03/06/2022)   Hunger Vital Sign    Worried About Running Out of Food in the Last Year: Never true    [place] in the Last Year: Never true  Transportation Needs: No Transportation  Needs (03/06/2022)   PRAPARE - Hydrologist (Medical): No    Lack of Transportation (Non-Medical): No  Physical Activity: Sufficiently Active (03/06/2022)   Exercise Vital Sign    Days of Exercise per Week: 5 days    Minutes of Exercise per Session: 30 min  Stress: No Stress Concern Present (03/06/2022)   [place]    Feeling of Stress :  Not at all  Social Connections: Unknown (03/06/2022)   Social Connection and Isolation Panel [NHANES]    Frequency of Communication with Friends and Family: Not on file    Frequency of Social Gatherings with Friends and Family: Not on file    Attends Religious Services: Not on file    Active Member of Clubs or Organizations: Not on file    Attends Archivist Meetings: Not on file    Marital Status: Married  Intimate Partner Violence: Not At Risk (03/06/2022)   Humiliation, Afraid, Rape, and Kick questionnaire    Fear of Current or Ex-Partner: No    Emotionally Abused: No    Physically Abused: No    Sexually Abused: No     PHYSICAL EXAM  GENERAL EXAM/CONSTITUTIONAL: Vitals:  Vitals:   03/30/22 0906  BP: (!) 165/75  Pulse: 78  Weight: 165 lb 12.8 oz (75.2 kg)  Height: '5\' 6"'$  (1.676 m)   Body mass index is 26.76 kg/m. Wt Readings from Last 3 Encounters:  03/30/22 165 lb 12.8 oz (75.2 kg)  03/28/22 165 lb (74.8 kg)  03/16/22 165 lb (74.8 kg)   Patient is in no distress; well developed, nourished and groomed; neck is supple  CARDIOVASCULAR: Examination of carotid arteries is normal; no carotid bruits Regular rate and rhythm, no murmurs Examination of peripheral vascular system by observation and palpation is normal  EYES: Ophthalmoscopic exam of optic discs and posterior segments is normal; no papilledema or hemorrhages No results found.  MUSCULOSKELETAL: Gait, strength, tone, movements noted in Neurologic exam  below  NEUROLOGIC: MENTAL STATUS:      No data to display         awake, alert, oriented to person, place and time recent and remote memory intact normal attention and concentration language fluent, comprehension intact, naming intact fund of knowledge appropriate  CRANIAL NERVE:  2nd - no papilledema on fundoscopic exam 2nd, 3rd, 4th, 6th - pupils equal and reactive to light, visual fields full to confrontation, extraocular muscles intact, no nystagmus 5th - facial sensation symmetric 7th - facial strength symmetric 8th - hearing intact 9th - palate elevates symmetrically, uvula midline 11th - shoulder shrug symmetric 12th - tongue protrusion midline  MOTOR:  normal bulk and tone, full strength in the BUE, BLE  SENSORY:  normal and symmetric to light touch, temperature, vibration  COORDINATION:  finger-nose-finger, fine finger movements normal  REFLEXES:  deep tendon reflexes TRACE and symmetric  GAIT/STATION:  narrow based gait; ANTALGIC GAIT; LIMPING ON RIGHT KNEE     DIAGNOSTIC DATA (LABS, IMAGING, TESTING) - I reviewed patient records, labs, notes, testing and imaging myself where available.  Lab Results  Component Value Date   WBC 9.7 11/16/2021   HGB 14.5 11/16/2021   HCT 43.4 11/16/2021   MCV 89.7 11/16/2021   PLT 162.0 11/16/2021      Component Value Date/Time   NA 146 (H) 11/16/2021 1107   NA 142 02/02/2021 1117   NA 139 07/28/2012 1858   K 3.7 11/16/2021 1107   K 3.7 07/28/2012 1858   CL 104 11/16/2021 1107   CL 107 07/28/2012 1858   CO2 33 (H) 11/16/2021 1107   CO2 27 07/28/2012 1858   GLUCOSE 67 (L) 11/16/2021 1107   GLUCOSE 114 (H) 07/28/2012 1858   BUN 22 11/16/2021 1107   BUN 19 02/02/2021 1117   BUN 16 07/28/2012 1858   CREATININE 0.97 11/16/2021 1107   CREATININE 1.27 07/28/2012 1858   CALCIUM 9.3 11/16/2021  1107   CALCIUM 8.9 07/28/2012 1858   PROT 6.9 11/16/2021 1107   ALBUMIN 4.7 11/16/2021 1107   AST 18 11/16/2021 1107    ALT 12 11/16/2021 1107   ALKPHOS 58 11/16/2021 1107   BILITOT 0.8 11/16/2021 1107   GFRNONAA >60 08/24/2020 1226   GFRNONAA 56 (L) 07/28/2012 1858   GFRAA >60 02/08/2015 1128   GFRAA >60 07/28/2012 1858   Lab Results  Component Value Date   CHOL 114 06/26/2021   HDL 45.50 06/26/2021   LDLCALC 47 06/26/2021   LDLDIRECT 76.0 10/20/2020   TRIG 107.0 06/26/2021   CHOLHDL 3 06/26/2021   Lab Results  Component Value Date   HGBA1C 5.3 05/29/2018   Lab Results  Component Value Date   FFMBWGYK59 935 11/16/2021   Lab Results  Component Value Date   TSH 0.86 12/06/2009    01/16/15 MRI brain [I reviewed images myself and agree with interpretation. -VRP]  1. Acute nonhemorrhagic 12 mm infarct within the inferior left cerebellum. 2. Second punctate infarct also in the left cerebellum. 3. Abnormal signal in left vertebral artery suggesting slow or occluded flow. 4. Question abnormal signal in the precavernous left internal carotid artery, also suggesting slow flow. 5. Mild subcortical white matter changes, slightly exaggerated for age.  02/08/15 MRI brain [I reviewed images myself and agree with interpretation. -VRP]  Expected evolution of small left PICA cerebellar infarct since 01/16/2015. No new intracranial abnormality.   ASSESSMENT AND PLAN  81 y.o. year old male here with    Dx:  1. 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    Penni Bombard, MD 70/17/7939, 03:00 AM Certified in Neurology, Neurophysiology and Neuroimaging  [facility] [address], [place], [place] [address] [phone]

## 2022-04-10 ENCOUNTER — Ambulatory Visit (INDEPENDENT_AMBULATORY_CARE_PROVIDER_SITE_OTHER): Payer: PPO

## 2022-04-10 ENCOUNTER — Telehealth: Payer: Self-pay | Admitting: Family Medicine

## 2022-04-10 DIAGNOSIS — I10 Essential (primary) hypertension: Secondary | ICD-10-CM

## 2022-04-10 NOTE — Telephone Encounter (Signed)
Patient dropped off a letter from [facility]  about his DOT exam. Patient's provider must sign the bottom. Letter is up front in Dr Ellen Henri color folder.

## 2022-04-10 NOTE — Telephone Encounter (Signed)
Form is in the sign basket.  Jamauri Kruzel,cma

## 2022-04-10 NOTE — Progress Notes (Signed)
Patient presented for BP check. BP was taken I left arm and was 134/80. Oxygen was 96 and hr was 80

## 2022-04-11 DIAGNOSIS — M25561 Pain in right knee: Secondary | ICD-10-CM | POA: Diagnosis not present

## 2022-04-11 LAB — BASIC METABOLIC PANEL
BUN: 20 mg/dL (ref 6–23)
CO2: 28 mEq/L (ref 19–32)
Calcium: 9.3 mg/dL (ref 8.4–10.5)
Chloride: 104 mEq/L (ref 96–112)
Creatinine, Ser: 0.96 mg/dL (ref 0.40–1.50)
GFR: 74.47 mL/min (ref >60.00)
Glucose, Bld: 107 mg/dL — ABNORMAL HIGH (ref 70–99)
Potassium: 4 mEq/L (ref 3.5–5.1)
Sodium: 143 mEq/L (ref 135–145)

## 2022-04-11 NOTE — H&P (Signed)
KNEE ARTHROPLASTY ADMISSION H&P  Patient ID: Cody Willis MRN: [id number] DOB/AGE: July 09, 1940 82 y.o.  Chief Complaint: right knee pain.  Planned Procedure Date: 05/01/22 Medical Clearance by Dr. Josephina Gip   Cardiac Clearance by Dr. Marlou Porch   HPI: MICHAELPAUL Willis is a 82 y.o. male who presents for evaluation of OA RIGHT KNEE. The patient has a history of pain and functional disability in the right knee due to arthritis and has failed non-surgical conservative treatments for greater than 12 weeks to include NSAID's and/or analgesics, corticosteriod injections, use of assistive devices, and activity modification.  Onset of symptoms was abrupt, starting 3 months ago with rapidlly worsening course since that time. The patient noted no past surgery on the right knee.  Patient currently rates pain at 10 out of 10 with activity. Patient has night pain, worsening of pain with activity and weight bearing, and pain that interferes with activities of daily living.  Patient has evidence of subchondral sclerosis and joint space narrowing to the medial compartment of the right knee by imaging studies.  There is no active infection.  Past Medical History:  Diagnosis Date   Arthritis    fingers   Basal cell carcinoma 02/10/2008   left top of shoulder   Carpal tunnel syndrome    Cataract    bilateral surgery repair   Chronic pancreatitis ([place])    COPD (chronic obstructive pulmonary disease) ([place])    COVID-19    Esophageal reflux    Gastric polyps    hyperplastic   Herpes zoster    Hiatal hernia    Other B-complex deficiencies    Pure hypercholesterolemia    Stroke ([place]) 01/16/2015   only on ASA   Past Surgical History:  Procedure Laterality Date   APPENDECTOMY     BACK SURGERY     Diskectomy between L4 and L5   CARPAL TUNNEL RELEASE Left 06/27/2021   Procedure: CARPAL TUNNEL RELEASE LEFT;  Surgeon: Daryll Brod, MD;  Location: [facility];  Service: Orthopedics;  Laterality:  Left;  Regional with monitored anesthesia care   CATARACT EXTRACTION W/ INTRAOCULAR LENS  IMPLANT, BILATERAL  12/15 and 2/16   CHOLECYSTECTOMY     COLONOSCOPY  2011   MELANOMA EXCISION  ~2009   right upper abdomen   Allergies  Allergen Reactions   Meloxicam     "Tears my stomach up"   Plavix [Clopidogrel Bisulfate] Itching   Sulfamethoxazole-Trimethoprim Rash   Prior to Admission medications   Medication Sig Start Date End Date Taking? Authorizing Provider  albuterol (VENTOLIN HFA) 108 (90 Base) MCG/ACT inhaler Inhale 1-2 puffs into the lungs every 4 (four) hours as needed for wheezing or shortness of breath. 03/03/21   Melynda Ripple, MD  aspirin EC 81 MG tablet Take 1 tablet (81 mg total) by mouth daily. Swallow whole. 02/02/21   Jerline Pain, MD  cyanocobalamin (VITAMIN B12) 1000 MCG/ML injection INJECT 1ML INTO THE MUSCLE EVERY 30 DAYS. PHARMACY-PLEASE PROVIDE APPROPRIATE NEEDLES AND SYRINGES 11/16/21   Noralyn Pick, NP  fluorouracil (EFUDEX) 5 % cream Apply to the forehead BID x 1 week. 01/11/22   Ralene Bathe, MD  ketoconazole (NIZORAL) 2 % shampoo apply three times per week, massage into scalp and leave in for 10 minutes before rinsing out 01/11/22   Ralene Bathe, MD  Pancrelipase, Lip-Prot-Amyl, ([name]) 25000-79000 units CPEP Take 2 capsules by mouth with breakfast, with lunch, and with evening meal. 11/16/21   Noralyn Pick, NP  pantoprazole (PROTONIX) 40 MG tablet Take 1 tablet (40 mg total) by mouth every morning. 11/16/21   Noralyn Pick, NP  rosuvastatin (CRESTOR) 20 MG tablet Take 1 tablet (20 mg total) by mouth daily. Please call [phone] to schedule an overdue appointment with Dr. Candee Furbish for future refills. Thank you. 2nd attempt. 02/22/22   Leone Haven, MD  triamcinolone (KENALOG) 0.025 % ointment daily as needed for rash. 03/27/18   [provider]  valsartan (DIOVAN) 80 MG tablet Take 1 tablet (80 mg  total) by mouth daily. 03/28/22   Leone Haven, MD   Social History   Socioeconomic History   Marital status: Married    Spouse name: Cody Willis   Number of children: 2   Years of education: high school, some college   Highest education level: Not on file  Occupational History   Occupation: Truck driver-- local    Comment: part time  Tobacco Use   Smoking status: Former    Types: Cigarettes   Smokeless tobacco: Never  Scientific laboratory technician Use: Never used  Substance and Sexual Activity   Alcohol use: No    Alcohol/week: 0.0 standard drinks of alcohol   Drug use: Never   Sexual activity: Not on file  Other Topics Concern   Not on file  Social History Narrative   No living will   Wife should make decisions for him if needed   Would accept resuscitation attempts   Not sure about tube feeds   Lives Cody Willis   2 children and 2 step children - some children live nearby   Has grandchildren and step grandchildren - 3 total   Enjoys: work   Work - Administrator, has to stay in good shape for that   Social Determinants of Radio broadcast assistant Strain: [place]  (03/06/2022)   Overall Financial Resource Strain (CARDIA)    Difficulty of Paying Living Expenses: Not hard at all  Food Insecurity: No Food Insecurity (03/06/2022)   Hunger Vital Sign    Worried About Running Out of Food in the Last Year: Never true    [place] in the Last Year: Never true  Transportation Needs: No Transportation Needs (03/06/2022)   PRAPARE - Hydrologist (Medical): No    Lack of Transportation (Non-Medical): No  Physical Activity: Sufficiently Active (03/06/2022)   Exercise Vital Sign    Days of Exercise per Week: 5 days    Minutes of Exercise per Session: 30 min  Stress: No Stress Concern Present (03/06/2022)   [place]    Feeling of Stress : Not at all  Social Connections: Unknown  (03/06/2022)   Social Connection and Isolation Panel [NHANES]    Frequency of Communication with Friends and Family: Not on file    Frequency of Social Gatherings with Friends and Family: Not on file    Attends Religious Services: Not on file    Active Member of Clubs or Organizations: Not on file    Attends Archivist Meetings: Not on file    Marital Status: Married   Family History  Problem Relation Age of Onset   Stroke Mother    Diabetes Mother    Emphysema Father        smoker   Crohn's disease Sister    Colitis Brother    Colon cancer Neg Hx    Colon polyps Neg Hx  Esophageal cancer Neg Hx    Rectal cancer Neg Hx    Stomach cancer Neg Hx     ROS: Currently denies lightheadedness, dizziness, Fever, chills, CP, SOB.   No personal history of DVT, PE, or MI. + h/o CVA 2016 Full dentures All other systems have been reviewed and were otherwise currently negative with the exception of those mentioned in the HPI and as above.  Objective: Vitals: Ht: 5'6" Wt: 165.2 lbs Temp: 97.7 BP: 131/83 Pulse: 85 O2 96% on room air.   Physical Exam: General: Alert, NAD.  Antalgic Gait  HEENT: EOMI, Good Neck Extension  Pulm: No increased work of breathing. Slight expiratory wheezing B/L A/P. CV: RRR, No m/g/r appreciated  GI: soft, NT, ND. BS x 4 quadrants Neuro: CN II-XII grossly intact without focal deficit.  Sensation intact distally Skin: No lesions in the area of chief complaint MSK/Surgical Site:   + medial JLT. ROM 20-90 degrees and slow.  Decreased strength in extension and flexion.  +EHL/FHL.  NVI.  Stable varus and valgus stress.    Imaging Review Plain radiographs demonstrate severe degenerative joint disease of the medial compartment of the right knee.   The overall alignment issignificant varus. The bone quality appears to be fair for age and reported activity level.  Preoperative templating of the joint replacement has been completed, documented, and  submitted to the Operating Room personnel in order to optimize intra-operative equipment management.  Assessment: OA RIGHT KNEE Active Problems:   * No active hospital problems. *   Plan: Plan for Procedure(s): UNICOMPARTMENTAL KNEE  The patient history, physical exam, clinical judgement of the provider and imaging are consistent with end stage degenerative joint disease and total joint arthroplasty is deemed medically necessary. The treatment options including medical management, injection therapy, and arthroplasty were discussed at length. The risks and benefits of Procedure(s): UNICOMPARTMENTAL KNEE were presented and reviewed.  The risks of nonoperative treatment, versus surgical intervention including but not limited to continued pain, aseptic loosening, stiffness, dislocation/subluxation, infection, bleeding, nerve injury, blood clots, cardiopulmonary complications, morbidity, mortality, among others were discussed. The patient verbalizes understanding and wishes to proceed with the plan.  Patient is being admitted for inpatient treatment for surgery, pain control, PT, prophylactic antibiotics, VTE prophylaxis, progressive ambulation, ADL's and discharge planning.     The patient does meet the criteria for TXA which will be used perioperatively.   ASA 81 mg BID will be used postoperatively for DVT prophylaxis in addition to SCDs, and early ambulation. Plan for Tylenol, Celebrex, oxycodone for pain.   Robaxin for muscle spasms.   Zofran for nausea and vomiting. Senokot for constipation prevention [place] The patient is planning to be discharged home with OPPT and into the care of his wife Cody Willis who can be reached at [phone] Follow up appt 05/16/22 at 4:15pm     Alisa Graff Office [phone] 04/11/2022 6:09 PM

## 2022-04-12 DIAGNOSIS — H353212 Exudative age-related macular degeneration, right eye, with inactive choroidal neovascularization: Secondary | ICD-10-CM | POA: Diagnosis not present

## 2022-04-12 NOTE — Telephone Encounter (Signed)
Patient came in and I made a copy of the form after the provider signed and gave it to the patient.  Alesa Echevarria,cma

## 2022-04-12 NOTE — Telephone Encounter (Signed)
Form signed.

## 2022-04-15 NOTE — Assessment & Plan Note (Signed)
BP at goal with 80 mg valsartan  per RN visit.

## 2022-04-15 NOTE — Progress Notes (Signed)
Patient presented for BP check. BP was taken I left arm and was 134/80. Oxygen was 96 and hr was 80  BP is now at goal.  Continue current medications

## 2022-04-16 NOTE — Care Plan (Signed)
Ortho Bundle Case Management Note  Patient Details  Name: [name] MRN: [id number] Date of Birth: 14-May-1940  met with patient and wife in the office today for H&P. he will discharge to home with family to assist. Rolling walker and CPM ordered. [facility]PP[facility] up with [facility]. discharge orders discussed and questions answered. appointments confirmed. Patient and MD in agreement with plan. Choice offered.                     DME Arranged:  [place] Rile rolling, CPM DME Agency:  [facility]  HH Arranged:    [place] Agency:     Additional Comments: Please contact me with any questions of if this plan should need to change.  Ladell Heads,  [place] [facility]  [phone] 04/16/2022, 12:21 PM

## 2022-04-18 NOTE — Progress Notes (Signed)
COVID Vaccine Completed: yes  Date of COVID positive in last 90 days:  PCP - Tommi Rumps, MD Cardiologist - Candee Furbish, MD  Cardiac clearance by Candee Furbish 03/16/22 in Epic   Chest x-ray -  EKG - 03/16/22 Epic Stress Test -  ECHO - 02/24/21 Epic Cardiac Cath -  Pacemaker/ICD device last checked: Spinal Cord Stimulator:  Bowel Prep -   Sleep Study -  CPAP -   Fasting Blood Sugar - pre DM Checks Blood Sugar _____ times a day  Last dose of GLP1 agonist-  N/A GLP1 instructions:  N/A   Last dose of SGLT-2 inhibitors-  N/A SGLT-2 instructions: N/A   Blood Thinner Instructions: Aspirin Instructions: ASA 81 Last Dose:  Activity level:  Can go up a flight of stairs and perform activities of daily living without stopping and without symptoms of chest pain or shortness of breath.  Able to exercise without symptoms  Unable to go up a flight of stairs without symptoms of     Anesthesia review: HTN, CAD ,aortic atherosclerosis, pulm modules, CVA, preDM, COPD, esophageal dysphagia   Patient denies shortness of breath, fever, cough and chest pain at PAT appointment  Patient verbalized understanding of instructions that were given to them at the PAT appointment. Patient was also instructed that they will need to review over the PAT instructions again at home before surgery.

## 2022-04-19 ENCOUNTER — Encounter (HOSPITAL_COMMUNITY)
Admission: RE | Admit: 2022-04-19 | Discharge: 2022-04-19 | Disposition: A | Discharge status: Home / Self Care | Payer: PPO | Source: Ambulatory Visit | Attending: Orthopedic Surgery | Admitting: Orthopedic Surgery

## 2022-04-19 ENCOUNTER — Encounter (HOSPITAL_COMMUNITY): Payer: Self-pay

## 2022-04-19 VITALS — BP 135/73 | HR 70 | Temp 98.7°F | Resp 12 | Ht 66.5 in | Wt 163.0 lb

## 2022-04-19 DIAGNOSIS — Z01812 Encounter for preprocedural laboratory examination: Secondary | ICD-10-CM | POA: Insufficient documentation

## 2022-04-19 DIAGNOSIS — I2510 Atherosclerotic heart disease of native coronary artery without angina pectoris: Secondary | ICD-10-CM | POA: Diagnosis not present

## 2022-04-19 DIAGNOSIS — R7303 Prediabetes: Secondary | ICD-10-CM | POA: Insufficient documentation

## 2022-04-19 DIAGNOSIS — Z01818 Encounter for other preprocedural examination: Secondary | ICD-10-CM

## 2022-04-19 HISTORY — DX: Essential (primary) hypertension: I10

## 2022-04-19 HISTORY — DX: Pneumonia, unspecified organism: J18.9

## 2022-04-19 LAB — CBC
HCT: 44.3 % (ref 39.0–52.0)
Hemoglobin: 14.7 g/dL (ref 13.0–17.0)
MCH: 29.5 pg (ref 26.0–34.0)
MCHC: 33.2 g/dL (ref 30.0–36.0)
MCV: 89 fL (ref 80.0–100.0)
Platelets: 187 10*3/uL (ref 150–400)
RBC: 4.98 MIL/uL (ref 4.22–5.81)
RDW: 13.7 % (ref 11.5–15.5)
WBC: 10.4 10*3/uL (ref 4.0–10.5)
nRBC: 0 % (ref 0.0–0.2)

## 2022-04-19 LAB — SURGICAL PCR SCREEN
MRSA, PCR: NEGATIVE
Staphylococcus aureus: NEGATIVE

## 2022-04-19 NOTE — Patient Instructions (Signed)
SURGICAL WAITING ROOM VISITATION  Patients having surgery or a procedure may have no more than 2 support people in the waiting area - these visitors may rotate.    Children under the age of 90 must have an adult with them who is not the patient.  Due to an increase in RSV and influenza rates and associated hospitalizations, children ages 2 and under may not visit patients in [place].  If the patient needs to stay at the hospital during part of their recovery, the visitor guidelines for inpatient rooms apply. Pre-op nurse will coordinate an appropriate time for 1 support person to accompany patient in pre-op.  This support person may not rotate.    Please refer to the [facility] website for the visitor guidelines for Inpatients (after your surgery is over and you are in a regular room).     Your procedure is scheduled on: 05/01/22   Report to [facility] Main Entrance    Report to admitting at 7:35 AM   Call this number if you have problems the morning of surgery [phone]   Do not eat food :After Midnight.   After Midnight you may have the following liquids until 7:05 AM DAY OF SURGERY  Water Non-Citrus Juices (without pulp, NO RED-Apple, White grape, White cranberry) Black Coffee (NO MILK/CREAM OR CREAMERS, sugar ok)  Clear Tea (NO MILK/CREAM OR CREAMERS, sugar ok) regular and decaf                             Plain Jell-O (NO RED)                                           Fruit ices (not with fruit pulp, NO RED)                                     Popsicles (NO RED)                                                               Sports drinks like Gatorade (NO RED)                  The day of surgery:  Drink ONE (1) Pre-Surgery Clear Ensure at 7:05 AM the morning of surgery. Drink in one sitting. Do not sip.  This drink was given to you during your hospital  pre-op appointment visit. Nothing else to drink after completing the  Pre-Surgery Clear  Ensure.          If you have questions, please contact your surgeon's office.   FOLLOW BOWEL PREP AND ANY ADDITIONAL PRE OP INSTRUCTIONS YOU RECEIVED FROM YOUR SURGEON'S OFFICE!!!     Oral Hygiene is also important to reduce your risk of infection.                                    Remember - BRUSH YOUR TEETH THE MORNING OF SURGERY WITH YOUR REGULAR TOOTHPASTE  DENTURES WILL BE REMOVED PRIOR TO SURGERY PLEASE DO NOT APPLY "Poly grip" OR ADHESIVES!!!   Take these medicines the morning of surgery with A SIP OF WATER: Inhalers, Pantoprazole, Rosuvastatin                               You may not have any metal on your body including jewelry, and body piercing             Do not wear make-up, lotions, powders, cologne, or deodorant  Do not shave  48 hours prior to surgery.               Men may shave face and neck.   Do not bring valuables to the hospital. [place].   Contacts, glasses, dentures or bridgework may not be worn into surgery.  DO NOT [place]. PHARMACY WILL DISPENSE MEDICATIONS LISTED ON YOUR MEDICATION LIST TO YOU DURING YOUR ADMISSION [place]!    Patients discharged on the day of surgery will not be allowed to drive home.  Someone NEEDS to stay with you for the first 24 hours after anesthesia.              Please read over the following fact sheets you were given: IF [place] [phone] Schneiders    If you received a COVID test during your pre-op visit  it is requested that you wear a mask when out in public, stay away from anyone that may not be feeling well and notify your surgeon if you develop symptoms. If you test positive for Covid or have been in contact with anyone that has tested positive in the last 10 days please notify you surgeon.    [place] - Preparing for Surgery Before surgery, you can play an important  role.  Because skin is not sterile, your skin needs to be as free of germs as possible.  You can reduce the number of germs on your skin by washing with CHG (chlorahexidine gluconate) soap before surgery.  CHG is an antiseptic cleaner which kills germs and bonds with the skin to continue killing germs even after washing. Please DO NOT use if you have an allergy to CHG or antibacterial soaps.  If your skin becomes reddened/irritated stop using the CHG and inform your nurse when you arrive at Short Stay. Do not shave (including legs and underarms) for at least 48 hours prior to the first CHG shower.  You may shave your face/neck.  Please follow these instructions carefully:  1.  Shower with CHG Soap the night before surgery and the  morning of surgery.  2.  If you choose to wash your hair, wash your hair first as usual with your normal  shampoo.  3.  After you shampoo, rinse your hair and body thoroughly to remove the shampoo.                             4.  Use CHG as you would any other liquid soap.  You can apply chg directly to the skin and wash.  Gently with a scrungie or clean washcloth.  5.  Apply the CHG Soap to your body ONLY FROM THE NECK DOWN.  Do   not use on face/ open                           Wound or open sores. Avoid contact with eyes, ears mouth and   genitals (private parts).                       Wash face,  Genitals (private parts) with your normal soap.             6.  Wash thoroughly, paying special attention to the area where your    surgery  will be performed.  7.  Thoroughly rinse your body with warm water from the neck down.  8.  DO NOT shower/wash with your normal soap after using and rinsing off the CHG Soap.                9.  Pat yourself dry with a clean towel.            10.  Wear clean pajamas.            11.  Place clean sheets on your bed the night of your first shower and do not  sleep with pets. Day of Surgery : Do not apply any lotions/deodorants the morning  of surgery.  Please wear clean clothes to the hospital/surgery center.  FAILURE TO FOLLOW THESE INSTRUCTIONS MAY RESULT IN THE CANCELLATION OF YOUR SURGERY  PATIENT SIGNATURE_________________________________  NURSE SIGNATURE__________________________________  ________________________________________________________________________  Adam Phenix  An incentive spirometer is a tool that can help keep your lungs clear and active. This tool measures how well you are filling your lungs with each breath. Taking long deep breaths may help reverse or decrease the chance of developing breathing (pulmonary) problems (especially infection) following: A long period of time when you are unable to move or be active. BEFORE THE PROCEDURE  If the spirometer includes an indicator to show your best effort, your nurse or respiratory therapist will set it to a desired goal. If possible, sit up straight or lean slightly forward. Try not to slouch. Hold the incentive spirometer in an upright position. INSTRUCTIONS FOR USE  Sit on the edge of your bed if possible, or sit up as far as you can in bed or on a chair. Hold the incentive spirometer in an upright position. Breathe out normally. Place the mouthpiece in your mouth and seal your lips tightly around it. Breathe in slowly and as deeply as possible, raising the piston or the ball toward the top of the column. Hold your breath for 3-5 seconds or for as long as possible. Allow the piston or ball to fall to the bottom of the column. Remove the mouthpiece from your mouth and breathe out normally. Rest for a few seconds and repeat Steps 1 through 7 at least 10 times every 1-2 hours when you are awake. Take your time and take a few normal breaths between deep breaths. The spirometer may include an indicator to show your best effort. Use the indicator as a goal to work toward during each repetition. After each set of 10 deep breaths, practice coughing to be  sure your lungs are clear. If you have an incision (the cut made at the time of surgery), support your incision when coughing by placing a pillow or rolled up towels firmly against it. Once you are able to get out of bed, walk around indoors and cough well.  You may stop using the incentive spirometer when instructed by your caregiver.  RISKS AND COMPLICATIONS Take your time so you do not get dizzy or light-headed. If you are in pain, you may need to take or ask for pain medication before doing incentive spirometry. It is harder to take a deep breath if you are having pain. AFTER USE Rest and breathe slowly and easily. It can be helpful to keep track of a log of your progress. Your caregiver can provide you with a simple table to help with this. If you are using the spirometer at home, follow these instructions: [place] IF:  You are having difficultly using the spirometer. You have trouble using the spirometer as often as instructed. Your pain medication is not giving enough relief while using the spirometer. You develop fever of 100.5 F (38.1 C) or higher. SEEK IMMEDIATE MEDICAL CARE IF:  You cough up bloody sputum that had not been present before. You develop fever of 102 F (38.9 C) or greater. You develop worsening pain at or near the incision site. MAKE SURE YOU:  Understand these instructions. Will watch your condition. Will get help right away if you are not doing well or get worse. Document Released: 08/06/2006 Document Revised: 06/18/2011 Document Reviewed: 10/07/2006 [facility] Patient Information 2014 [place], [place].   ________________________________________________________________________

## 2022-04-20 LAB — HEMOGLOBIN A1C
Hgb A1c MFr Bld: 5.4 % (ref 4.8–5.6)
Mean Plasma Glucose: 108 mg/dL

## 2022-04-25 NOTE — Progress Notes (Signed)
Anesthesia Chart Review   Case: [id number] Date/Time: 05/01/22 0949   Procedure: UNICOMPARTMENTAL KNEE (Right: Knee)   Anesthesia type: Choice   Pre-op diagnosis: OA RIGHT KNEE   Location: [facility] ROOM 08 / [facility]   Surgeons: Renette Butters, MD       DISCUSSION:82 y.o. former smoker with h/o COPD, Stroke, HTN, right knee OA scheduled for above procedure 05/01/22 with Dr. Edmonia Lynch.   Pt seen by cardiology 03/16/2022 for preop evaluation.  Per OV note, "He is low overall risk from a cardiac perspective for knee surgery.  He may proceed.  He is not having any high risk symptoms such as angina or syncope.  No heart failure type symptoms.  EKG unremarkable.  Echocardiogram with normal pump function.  He does have nonobstructive coronary artery disease.  Dr. Edmonia Lynch is orthopedist."  Anticipate pt can proceed with planned procedure barring acute status change.   VS: BP 135/73   Pulse 70   Temp 37.1 C (Oral)   Resp 12   Ht 5' 6.5" (1.689 m)   Wt 73.9 kg   SpO2 96%   BMI 25.91 kg/m   PROVIDERS: Leone Haven, MD is PCP   Candee Furbish, MD is Cardiologist  LABS: Labs reviewed: Acceptable for surgery. (all labs ordered are listed, but only abnormal results are displayed)  Labs Reviewed  SURGICAL PCR SCREEN  HEMOGLOBIN A1C  CBC     IMAGES:   EKG:   CV: Echo 02/24/2021 1. Left ventricular ejection fraction, by estimation, is 60 to 65%. The  left ventricle has normal function. The left ventricle has no regional  wall motion abnormalities. Left ventricular diastolic parameters are  consistent with Grade I diastolic  dysfunction (impaired relaxation).   2. Right ventricular systolic function is normal. The right ventricular  size is normal. Tricuspid regurgitation signal is inadequate for assessing  PA pressure.   3. The mitral valve is normal in structure. No evidence of mitral valve  regurgitation. No evidence of mitral stenosis.   4. The aortic valve is  tricuspid. Aortic valve regurgitation is not  visualized. No aortic stenosis is present.  Past Medical History:  Diagnosis Date   Arthritis    fingers   Basal cell carcinoma 02/10/2008   left top of shoulder   Carpal tunnel syndrome    Cataract    bilateral surgery repair   Chronic pancreatitis ([place])    COPD (chronic obstructive pulmonary disease) ([place])    COVID-19    Esophageal reflux    Gastric polyps    hyperplastic   Herpes zoster    Hiatal hernia    Hypertension    Other B-complex deficiencies    Pneumonia    Pure hypercholesterolemia    Stroke ([place]) 01/16/2015   only on ASA    Past Surgical History:  Procedure Laterality Date   APPENDECTOMY     BACK SURGERY     Diskectomy between L4 and L5   CARPAL TUNNEL RELEASE Left 06/27/2021   Procedure: CARPAL TUNNEL RELEASE LEFT;  Surgeon: Daryll Brod, MD;  Location: [place];  Service: Orthopedics;  Laterality: Left;  Regional with monitored anesthesia care   CATARACT EXTRACTION W/ INTRAOCULAR LENS  IMPLANT, BILATERAL  12/15 and 2/16   CHOLECYSTECTOMY     COLONOSCOPY  2011   MELANOMA EXCISION  ~2009   right upper abdomen    MEDICATIONS:  albuterol (VENTOLIN HFA) 108 (90 Base) MCG/ACT inhaler   aspirin EC 81 MG tablet  cyanocobalamin (VITAMIN B12) 1000 MCG/ML injection   fluorouracil (EFUDEX) 5 % cream   ketoconazole (NIZORAL) 2 % shampoo   Pancrelipase, Lip-Prot-Amyl, ([name]) 25000-79000 units CPEP   pantoprazole (PROTONIX) 40 MG tablet   rosuvastatin (CRESTOR) 20 MG tablet   triamcinolone (KENALOG) 0.025 % ointment   valsartan (DIOVAN) 80 MG tablet   No current facility-administered medications for this encounter.   Konrad Felix Ward, PA-C [facility] Pre-Surgical Testing [phone]

## 2022-04-30 NOTE — Anesthesia Preprocedure Evaluation (Addendum)
Anesthesia Evaluation  Patient identified by MRN, date of birth, ID band Patient awake    Reviewed: Allergy & Precautions, NPO status , Patient's Chart, lab work & pertinent test results  Airway Mallampati: I  TM Distance: >3 FB Neck ROM: Full    Dental no notable dental hx. (+) Edentulous Upper, Edentulous Lower   Pulmonary COPD, former smoker   Pulmonary exam normal breath sounds clear to auscultation       Cardiovascular hypertension, Normal cardiovascular exam Rhythm:Regular Rate:Normal  11/2022Echo 1. Left ventricular ejection fraction, by estimation, is 60 to 65%. The  left ventricle has normal function. The left ventricle has no regional  wall motion abnormalities. Left ventricular diastolic parameters are  consistent with Grade I diastolic  dysfunction (impaired relaxation).   2. Right ventricular systolic function is normal. The right ventricular  size is normal. Tricuspid regurgitation signal is inadequate for assessing  PA pressure.   3. The mitral valve is normal in structure. No evidence of mitral valve  regurgitation. No evidence of mitral stenosis.   4. The aortic valve is tricuspid. Aortic valve regurgitation is not  visualized. No aortic stenosis is present.      Neuro/Psych CVA (2016), No Residual Symptoms    GI/Hepatic ,GERD  ,,  Endo/Other    Renal/GU Lab Results      Component                Value               Date                      CREATININE               0.96                04/10/2022                 K                        4.0                 04/10/2022                          Musculoskeletal  (+) Arthritis , Osteoarthritis,    Abdominal   Peds  Hematology Lab Results      Component                Value               Date                       HGB                      14.7                04/19/2022                HCT                      44.3                04/19/2022      PLT                       187  04/19/2022              Anesthesia Other Findings   Reproductive/Obstetrics                             Anesthesia Physical Anesthesia Plan  ASA: 3  Anesthesia Plan: Spinal and Regional   Post-op Pain Management: Regional block* and Minimal or no pain anticipated   Induction: Intravenous  PONV Risk Score and Plan: 2 and Treatment may vary due to age or medical condition and Ondansetron  Airway Management Planned: Natural Airway and Nasal Cannula  Additional Equipment: None  Intra-op Plan:   Post-operative Plan:   Informed Consent: I have reviewed the patients History and Physical, chart, labs and discussed the procedure including the risks, benefits and alternatives for the proposed anesthesia with the patient or authorized representative who has indicated his/her understanding and acceptance.     Dental advisory given  Plan Discussed with: CRNA, Anesthesiologist and Surgeon  Anesthesia Plan Comments: (Spinal w R adductor)       Anesthesia Quick Evaluation

## 2022-05-01 ENCOUNTER — Other Ambulatory Visit: Payer: Self-pay

## 2022-05-01 ENCOUNTER — Ambulatory Visit (HOSPITAL_BASED_OUTPATIENT_CLINIC_OR_DEPARTMENT_OTHER): Payer: PPO | Admitting: Anesthesiology

## 2022-05-01 ENCOUNTER — Encounter (HOSPITAL_COMMUNITY): Payer: Self-pay | Admitting: Orthopedic Surgery

## 2022-05-01 ENCOUNTER — Ambulatory Visit (HOSPITAL_COMMUNITY)
Admission: RE | Admit: 2022-05-01 | Discharge: 2022-05-01 | Disposition: A | Discharge status: Home / Self Care | Payer: PPO | Attending: Orthopedic Surgery | Admitting: Orthopedic Surgery

## 2022-05-01 ENCOUNTER — Encounter (HOSPITAL_COMMUNITY)
Admission: RE | Disposition: A | Discharge status: Home / Self Care | Payer: Self-pay | Source: Home / Self Care | Attending: Orthopedic Surgery

## 2022-05-01 ENCOUNTER — Ambulatory Visit (HOSPITAL_COMMUNITY): Payer: PPO

## 2022-05-01 ENCOUNTER — Ambulatory Visit (HOSPITAL_COMMUNITY): Payer: PPO | Admitting: Physician Assistant

## 2022-05-01 DIAGNOSIS — R262 Difficulty in walking, not elsewhere classified: Secondary | ICD-10-CM | POA: Diagnosis not present

## 2022-05-01 DIAGNOSIS — K219 Gastro-esophageal reflux disease without esophagitis: Secondary | ICD-10-CM | POA: Insufficient documentation

## 2022-05-01 DIAGNOSIS — J449 Chronic obstructive pulmonary disease, unspecified: Secondary | ICD-10-CM

## 2022-05-01 DIAGNOSIS — I10 Essential (primary) hypertension: Secondary | ICD-10-CM

## 2022-05-01 DIAGNOSIS — Z87891 Personal history of nicotine dependence: Secondary | ICD-10-CM | POA: Insufficient documentation

## 2022-05-01 DIAGNOSIS — Z471 Aftercare following joint replacement surgery: Secondary | ICD-10-CM | POA: Diagnosis not present

## 2022-05-01 DIAGNOSIS — M1711 Unilateral primary osteoarthritis, right knee: Secondary | ICD-10-CM | POA: Insufficient documentation

## 2022-05-01 DIAGNOSIS — Z96651 Presence of right artificial knee joint: Secondary | ICD-10-CM

## 2022-05-01 DIAGNOSIS — R7303 Prediabetes: Secondary | ICD-10-CM

## 2022-05-01 DIAGNOSIS — G8918 Other acute postprocedural pain: Secondary | ICD-10-CM | POA: Diagnosis not present

## 2022-05-01 DIAGNOSIS — M25461 Effusion, right knee: Secondary | ICD-10-CM | POA: Diagnosis not present

## 2022-05-01 HISTORY — PX: PARTIAL KNEE ARTHROPLASTY: SHX2174

## 2022-05-01 SURGERY — ARTHROPLASTY, KNEE, UNICOMPARTMENTAL
Anesthesia: Regional | Site: Knee | Laterality: Right

## 2022-05-01 MED ORDER — LACTATED RINGERS IV SOLN: INTRAVENOUS | Status: DC

## 2022-05-01 MED ORDER — CEFAZOLIN SODIUM-DEXTROSE 1-4 GM/50ML-% IV SOLN: 1.0000 g | Freq: Four times a day (QID) | INTRAVENOUS | Status: DC

## 2022-05-01 MED ORDER — ONDANSETRON HCL 4 MG/2ML IJ SOLN
INTRAMUSCULAR | Status: AC
  Filled 2022-05-01: qty 2

## 2022-05-01 MED ORDER — PHENYLEPHRINE HCL (PRESSORS) 10 MG/ML IV SOLN
INTRAVENOUS | Status: AC
  Filled 2022-05-01: qty 1

## 2022-05-01 MED ORDER — PROPOFOL 1000 MG/100ML IV EMUL
INTRAVENOUS | Status: AC
  Filled 2022-05-01: qty 100

## 2022-05-01 MED ORDER — POVIDONE-IODINE 10 % EX SWAB
2.0000 | Freq: Once | CUTANEOUS | Status: AC
  Administered 2022-05-01: 2 via TOPICAL

## 2022-05-01 MED ORDER — FENTANYL CITRATE PF 50 MCG/ML IJ SOSY: 25.0000 ug | PREFILLED_SYRINGE | INTRAMUSCULAR | Status: DC | PRN

## 2022-05-01 MED ORDER — SODIUM CHLORIDE 0.9 % IV SOLN
INTRAVENOUS | Status: DC | PRN
  Administered 2022-05-01: 50 mL

## 2022-05-01 MED ORDER — ROPIVACAINE HCL 5 MG/ML IJ SOLN
INTRAMUSCULAR | Status: DC | PRN
  Administered 2022-05-01: 30 mL via PERINEURAL

## 2022-05-01 MED ORDER — LACTATED RINGERS IV BOLUS: 250.0000 mL | Freq: Once | INTRAVENOUS | Status: DC

## 2022-05-01 MED ORDER — OXYCODONE HCL 5 MG PO TABS
ORAL_TABLET | ORAL | Status: AC
  Filled 2022-05-01: qty 2

## 2022-05-01 MED ORDER — 0.9 % SODIUM CHLORIDE (POUR BTL) OPTIME
TOPICAL | Status: DC | PRN
  Administered 2022-05-01: 1000 mL

## 2022-05-01 MED ORDER — DICLOFENAC SODIUM 75 MG PO TBEC: 75.0000 mg | DELAYED_RELEASE_TABLET | Freq: Two times a day (BID) | ORAL | 2 refills | Status: DC | PRN

## 2022-05-01 MED ORDER — CHLORHEXIDINE GLUCONATE 0.12 % MT SOLN
15.0000 mL | Freq: Once | OROMUCOSAL | Status: AC
  Administered 2022-05-01: 15 mL via OROMUCOSAL

## 2022-05-01 MED ORDER — SODIUM CHLORIDE (PF) 0.9 % IJ SOLN
INTRAMUSCULAR | Status: AC
  Filled 2022-05-01: qty 30

## 2022-05-01 MED ORDER — OXYCODONE HCL 5 MG PO TABS: 5.0000 mg | ORAL_TABLET | ORAL | 0 refills | Status: DC | PRN

## 2022-05-01 MED ORDER — ACETAMINOPHEN 500 MG PO TABS: 1000.0000 mg | ORAL_TABLET | Freq: Four times a day (QID) | ORAL | 0 refills | Status: DC | PRN

## 2022-05-01 MED ORDER — BUPIVACAINE IN DEXTROSE 0.75-8.25 % IT SOLN
INTRATHECAL | Status: DC | PRN
  Administered 2022-05-01: 1.8 mL via INTRATHECAL

## 2022-05-01 MED ORDER — ONDANSETRON 4 MG PO TBDP: 4.0000 mg | ORAL_TABLET | Freq: Three times a day (TID) | ORAL | 0 refills | Status: DC | PRN

## 2022-05-01 MED ORDER — ORAL CARE MOUTH RINSE: 15.0000 mL | Freq: Once | OROMUCOSAL | Status: AC

## 2022-05-01 MED ORDER — PROPOFOL 10 MG/ML IV BOLUS
INTRAVENOUS | Status: DC | PRN
  Administered 2022-05-01: 30 mg via INTRAVENOUS

## 2022-05-01 MED ORDER — OXYCODONE HCL 5 MG PO TABS
5.0000 mg | ORAL_TABLET | ORAL | Status: DC | PRN
  Administered 2022-05-01: 5 mg via ORAL

## 2022-05-01 MED ORDER — DEXAMETHASONE SODIUM PHOSPHATE 10 MG/ML IJ SOLN
INTRAMUSCULAR | Status: AC
  Filled 2022-05-01: qty 1

## 2022-05-01 MED ORDER — PHENYLEPHRINE HCL-NACL 20-0.9 MG/250ML-% IV SOLN
INTRAVENOUS | Status: DC | PRN
  Administered 2022-05-01: 40 ug/min via INTRAVENOUS

## 2022-05-01 MED ORDER — BUPIVACAINE LIPOSOME 1.3 % IJ SUSP
INTRAMUSCULAR | Status: AC
  Filled 2022-05-01: qty 20

## 2022-05-01 MED ORDER — POVIDONE-IODINE 10 % EX SWAB: 2.0000 | Freq: Once | CUTANEOUS | Status: DC

## 2022-05-01 MED ORDER — PHENYLEPHRINE 80 MCG/ML (10ML) SYRINGE FOR IV PUSH (FOR BLOOD PRESSURE SUPPORT)
PREFILLED_SYRINGE | INTRAVENOUS | Status: AC
  Filled 2022-05-01: qty 10

## 2022-05-01 MED ORDER — CLONIDINE HCL (ANALGESIA) 100 MCG/ML EP SOLN
EPIDURAL | Status: DC | PRN
  Administered 2022-05-01: 100 ug

## 2022-05-01 MED ORDER — SODIUM CHLORIDE 0.9 % IR SOLN
Status: DC | PRN
  Administered 2022-05-01: 1000 mL

## 2022-05-01 MED ORDER — TRANEXAMIC ACID-NACL 1000-0.7 MG/100ML-% IV SOLN
1000.0000 mg | INTRAVENOUS | Status: AC
  Administered 2022-05-01: 1000 mg via INTRAVENOUS
  Filled 2022-05-01: qty 100

## 2022-05-01 MED ORDER — OXYCODONE HCL 5 MG PO TABS: 10.0000 mg | ORAL_TABLET | ORAL | Status: DC | PRN

## 2022-05-01 MED ORDER — MIDAZOLAM HCL 2 MG/2ML IJ SOLN
1.0000 mg | INTRAMUSCULAR | Status: DC
  Filled 2022-05-01: qty 2

## 2022-05-01 MED ORDER — SENNA-DOCUSATE SODIUM 8.6-50 MG PO TABS: 2.0000 | ORAL_TABLET | Freq: Every day | ORAL | 1 refills | Status: DC | PRN

## 2022-05-01 MED ORDER — METHOCARBAMOL 750 MG PO TABS: 750.0000 mg | ORAL_TABLET | Freq: Three times a day (TID) | ORAL | 0 refills | Status: DC | PRN

## 2022-05-01 MED ORDER — ONDANSETRON HCL 4 MG/2ML IJ SOLN: 4.0000 mg | Freq: Once | INTRAMUSCULAR | Status: DC | PRN

## 2022-05-01 MED ORDER — FENTANYL CITRATE PF 50 MCG/ML IJ SOSY
50.0000 ug | PREFILLED_SYRINGE | INTRAMUSCULAR | Status: DC
  Administered 2022-05-01: 50 ug via INTRAVENOUS
  Filled 2022-05-01: qty 2

## 2022-05-01 MED ORDER — CEFAZOLIN SODIUM-DEXTROSE 2-4 GM/100ML-% IV SOLN
2.0000 g | INTRAVENOUS | Status: AC
  Administered 2022-05-01: 2 g via INTRAVENOUS
  Filled 2022-05-01: qty 100

## 2022-05-01 MED ORDER — DEXAMETHASONE SODIUM PHOSPHATE 10 MG/ML IJ SOLN
8.0000 mg | Freq: Once | INTRAMUSCULAR | Status: AC
  Administered 2022-05-01: 8 mg via INTRAVENOUS

## 2022-05-01 MED ORDER — LACTATED RINGERS IV BOLUS
500.0000 mL | Freq: Once | INTRAVENOUS | Status: AC
  Administered 2022-05-01: 500 mL via INTRAVENOUS

## 2022-05-01 MED ORDER — PROPOFOL 500 MG/50ML IV EMUL
INTRAVENOUS | Status: DC | PRN
  Administered 2022-05-01: 75 ug/kg/min via INTRAVENOUS

## 2022-05-01 MED ORDER — ASPIRIN 81 MG PO TBEC: 81.0000 mg | DELAYED_RELEASE_TABLET | Freq: Two times a day (BID) | ORAL | 0 refills | Status: DC

## 2022-05-01 MED ORDER — ONDANSETRON HCL 4 MG/2ML IJ SOLN
INTRAMUSCULAR | Status: DC | PRN
  Administered 2022-05-01: 4 mg via INTRAVENOUS

## 2022-05-01 MED ORDER — BUPIVACAINE LIPOSOME 1.3 % IJ SUSP: 20.0000 mL | Freq: Once | INTRAMUSCULAR | Status: DC

## 2022-05-01 MED ORDER — ACETAMINOPHEN 10 MG/ML IV SOLN: 1000.0000 mg | Freq: Once | INTRAVENOUS | Status: DC | PRN

## 2022-05-01 MED ORDER — ACETAMINOPHEN 500 MG PO TABS
1000.0000 mg | ORAL_TABLET | Freq: Once | ORAL | Status: AC
  Administered 2022-05-01: 1000 mg via ORAL
  Filled 2022-05-01: qty 2

## 2022-05-01 SURGICAL SUPPLY — 56 items
APL PRP STRL LF DISP 70% ISPRP (MISCELLANEOUS) ×1
BAG COUNTER SPONGE SURGICOUNT (BAG) ×2 IMPLANT
BAG SPNG CNTER NS LX DISP (BAG) ×1
BEARING MENISCAL TIBIAL 4 MD R (Orthopedic Implant) IMPLANT
BLADE SURG 15 STRL LF DISP TIS (BLADE) ×2 IMPLANT
BLADE SURG 15 STRL SS (BLADE) ×1
BNDG CMPR MED 10X6 ELC LF (GAUZE/BANDAGES/DRESSINGS) ×1
BNDG ELASTIC 6X10 VLCR STRL LF (GAUZE/BANDAGES/DRESSINGS) ×2 IMPLANT
BOWL SMART MIX CTS (DISPOSABLE) ×2 IMPLANT
BRNG TIB MED 4 PHS 3 RT MEN (Orthopedic Implant) ×1 IMPLANT
CEMENT BONE R 1X40 (Cement) IMPLANT
CHLORAPREP W/TINT 26 (MISCELLANEOUS) ×2 IMPLANT
CLSR STERI-STRIP ANTIMIC 1/2X4 (GAUZE/BANDAGES/DRESSINGS) ×2 IMPLANT
COVER SURGICAL LIGHT HANDLE (MISCELLANEOUS) ×2 IMPLANT
CUFF TOURN SGL QUICK 34 (TOURNIQUET CUFF) ×1
CUFF TRNQT CYL 34X4.125X (TOURNIQUET CUFF) ×2 IMPLANT
DRAPE ARTHROSCOPY W/POUCH 114 (DRAPES) ×2 IMPLANT
DRAPE INCISE IOBAN 66X45 STRL (DRAPES) ×6 IMPLANT
DRAPE U-SHAPE 47X51 STRL (DRAPES) ×2 IMPLANT
DRESSING MEPILEX FLEX 4X4 (GAUZE/BANDAGES/DRESSINGS) IMPLANT
DRSG MEPILEX FLEX 4X4 (GAUZE/BANDAGES/DRESSINGS)
DRSG MEPILEX POST OP 4X8 (GAUZE/BANDAGES/DRESSINGS) ×2 IMPLANT
ELECT REM PT RETURN 15FT ADLT (MISCELLANEOUS) ×2 IMPLANT
FORCEPS GRASP 3 PRONG 3.2FR (CUTTING FORCEPS) ×2 IMPLANT
GLOVE BIO SURGEON STRL SZ7.5 (GLOVE) ×4 IMPLANT
GLOVE BIOGEL PI IND STRL 7.5 (GLOVE) ×2 IMPLANT
GLOVE BIOGEL PI IND STRL 8 (GLOVE) ×2 IMPLANT
GLOVE SURG SYN 7.5  E (GLOVE) ×1
GLOVE SURG SYN 7.5 E (GLOVE) ×1 IMPLANT
GLOVE SURG SYN 7.5 PF PI (GLOVE) ×2 IMPLANT
GOWN STRL REUS W/ TWL LRG LVL3 (GOWN DISPOSABLE) ×2 IMPLANT
GOWN STRL REUS W/ TWL XL LVL3 (GOWN DISPOSABLE) ×2 IMPLANT
GOWN STRL REUS W/TWL LRG LVL3 (GOWN DISPOSABLE) ×1
GOWN STRL REUS W/TWL XL LVL3 (GOWN DISPOSABLE) ×1
HANDPIECE INTERPULSE COAX TIP (DISPOSABLE) ×1
IMMOBILIZER KNEE 20 (SOFTGOODS) ×1
IMMOBILIZER KNEE 20 THIGH 36 (SOFTGOODS) ×2 IMPLANT
KIT TURNOVER KIT A (KITS) IMPLANT
MANIFOLD NEPTUNE II (INSTRUMENTS) ×2 IMPLANT
NS IRRIG 1000ML POUR BTL (IV SOLUTION) ×2 IMPLANT
PACK BLADE SAW RECIP 70 3 PT (BLADE) IMPLANT
PACK TOTAL KNEE CUSTOM (KITS) ×2 IMPLANT
PEG TWIN FEM CEMENTED MED (Knees) IMPLANT
PROTECTOR NERVE ULNAR (MISCELLANEOUS) ×2 IMPLANT
SET HNDPC FAN SPRY TIP SCT (DISPOSABLE) ×2 IMPLANT
STRIP CLOSURE SKIN 1/2X4 (GAUZE/BANDAGES/DRESSINGS) IMPLANT
SUCTION FRAZIER HANDLE 10FR (MISCELLANEOUS) ×1
SUCTION TUBE FRAZIER 10FR DISP (MISCELLANEOUS) ×2 IMPLANT
SUT MNCRL AB 4-0 PS2 18 (SUTURE) ×2 IMPLANT
SUT VIC AB 0 CT1 36 (SUTURE) ×2 IMPLANT
SUT VIC AB 1 CT1 36 (SUTURE) ×2 IMPLANT
SUT VIC AB 2-0 CT1 27 (SUTURE) ×1
SUT VIC AB 2-0 CT1 TAPERPNT 27 (SUTURE) ×2 IMPLANT
TRAY FOLEY MTR SLVR 16FR STAT (SET/KITS/TRAYS/PACK) ×2 IMPLANT
TRAY TIBIAL OXFORD SZ C RT (Joint) IMPLANT
WRAP KNEE MAXI GEL POST OP (GAUZE/BANDAGES/DRESSINGS) ×2 IMPLANT

## 2022-05-01 NOTE — Evaluation (Signed)
Physical Therapy Evaluation Patient Details Name: Cody Willis MRN: [id number] DOB: [birth year] Today's Date: 05/01/2022  History of Present Illness  Pt is an 81yo male presenting s/p R-UKA on 05/01/22. PMH: COPD, herpes zoster, HTN, hx of stroke 2016, back surgery.  Clinical Impression  [name] is a 82 y.o. male POD 0 s/p R-UKA. Patient reports independence with mobility at baseline. Patient is now limited by functional impairments (see PT problem list below) and requires min assist for transfers and gait with RW. Patient was able to ambulate 100 feet with RW and min guard and cues for safe walker management. Patient educated on safe sequencing for stair mobility and verbalized safe guarding position for people assisting with mobility. Patient instructed in exercises to facilitate ROM and circulation. Patient will benefit from continued skilled PT interventions to address impairments and progress towards PLOF. Patient has met mobility goals at adequate level for discharge home; will continue to follow if pt continues acute stay to progress towards Mod I goals.       Recommendations for follow up therapy are one component of a multi-disciplinary discharge planning process, led by the attending physician.  Recommendations may be updated based on patient status, additional functional criteria and insurance authorization.  Follow Up Recommendations Follow physician's recommendations for discharge plan and follow up therapies      Assistance Recommended at Discharge Frequent or constant Supervision/Assistance  Patient can return home with the following  A little help with walking and/or transfers;A little help with bathing/dressing/bathroom;Assistance with cooking/housework;Assist for transportation;Help with stairs or ramp for entrance    Equipment Recommendations None recommended by PT (Pt has recommended DME)  Recommendations for Other Services       Functional Status Assessment  Patient has had a recent decline in their functional status and demonstrates the ability to make significant improvements in function in a reasonable and predictable amount of time.     Precautions / Restrictions Precautions Precautions: Fall;Knee Precaution Booklet Issued: No Precaution Comments: No pillow under the knee Required Braces or Orthoses: Knee Immobilizer - Right Knee Immobilizer - Right: On when out of bed or walking;Discontinue post op day 2 Restrictions Weight Bearing Restrictions: No Other Position/Activity Restrictions: wbat      Mobility  Bed Mobility Overal bed mobility: Needs Assistance Bed Mobility: Supine to Sit     Supine to sit: Min assist, HOB elevated     General bed mobility comments: Min assist for trunk elevation    Transfers Overall transfer level: Needs assistance Equipment used: Rolling walker (2 wheels) Transfers: Sit to/from Stand Sit to Stand: Min guard, From elevated surface           General transfer comment: VCs for hand placement, min guard for safety    Ambulation/Gait Ambulation/Gait assistance: Min guard Gait Distance (Feet): 100 Feet Assistive device: Rolling walker (2 wheels) Gait Pattern/deviations: Step-to pattern Gait velocity: decreas4ed     General Gait Details: Pt ambulated with RW, no physical assist required or LOB noted.  Stairs Stairs: Yes Stairs assistance: Min assist Stair Management: No rails, Step to pattern, Backwards, With walker Number of Stairs: 2 General stair comments: Provided handout. Pt completed stair training with min assist for steadying of RW and VCs for sequencing, no overt LOB noted.  Wheelchair Mobility    Modified Rankin (Stroke Patients Only)       Balance  Pertinent Vitals/Pain Pain Assessment Pain Assessment: 0-10 Pain Score: 5  Pain Location: abdomen Pain Descriptors / Indicators: Discomfort Pain  Intervention(s): Monitored during session, Repositioned    Home Living Family/patient expects to be discharged to:: Private residence Living Arrangements: Spouse/significant other Available Help at Discharge: Family;Available 24 hours/day Type of Home: House Home Access: Stairs to enter Entrance Stairs-Rails: None Entrance Stairs-Number of Steps: 2   Home Layout: One level Home Equipment: Conservation officer, nature (2 wheels)      Prior Function Prior Level of Function : Independent/Modified Independent             Mobility Comments: ind ADLs Comments: ind     Hand Dominance        Extremity/Trunk Assessment   Upper Extremity Assessment Upper Extremity Assessment: Overall WFL for tasks assessed    Lower Extremity Assessment Lower Extremity Assessment: LLE deficits/detail;RLE deficits/detail RLE Deficits / Details: MMT ank DF/pf 5/5 No extensor lag noted RLE Sensation: WNL LLE Deficits / Details: MMT ank DF/pf 5/5 LLE Coordination: WNL    Cervical / Trunk Assessment Cervical / Trunk Assessment: Kyphotic  Communication   Communication: No difficulties  Cognition Arousal/Alertness: Awake/alert Behavior During Therapy: WFL for tasks assessed/performed Overall Cognitive Status: Within Functional Limits for tasks assessed                                          General Comments General comments (skin integrity, edema, etc.): Wif epresent for session    Exercises Total Joint Exercises Ankle Circles/Pumps: AROM, Both, 10 reps Quad Sets: AROM, Right, Other reps (comment) (2) Short Arc Quad: AROM, Right, Other reps (comment) (2) Heel Slides: AROM, Right, Other reps (comment) (2) Hip ABduction/ADduction: AROM, Right, 5 reps Straight Leg Raises: AROM, Right, Other reps (comment) (2) Goniometric ROM: -5-75deg by gross visual approximation   Assessment/Plan    PT Assessment Patient needs continued PT services  PT Problem List Decreased strength;Decreased  range of motion;Decreased activity tolerance;Decreased balance;Decreased mobility;Pain       PT Treatment Interventions DME instruction;Gait training;Stair training;Functional mobility training;Therapeutic activities;Therapeutic exercise;Balance training;Patient/family education;Neuromuscular re-education    PT Goals (Current goals can be found in the Care Plan section)  Acute Rehab PT Goals Patient Stated Goal: To walk better PT Goal Formulation: With patient Time For Goal Achievement: 05/08/22 Potential to Achieve Goals: Good    Frequency 7X/week     Co-evaluation               AM-PAC PT "6 Clicks" Mobility  Outcome Measure Help needed turning from your back to your side while in a flat bed without using bedrails?: None Help needed moving from lying on your back to sitting on the side of a flat bed without using bedrails?: A Little Help needed moving to and from a bed to a chair (including a wheelchair)?: A Little Help needed standing up from a chair using your arms (e.g., wheelchair or bedside chair)?: A Little Help needed to walk in hospital room?: A Little Help needed climbing 3-5 steps with a railing? : A Little 6 Click Score: 19    End of Session Equipment Utilized During Treatment: Gait belt;Right knee immobilizer Activity Tolerance: Patient tolerated treatment well;No increased pain Patient left: in chair;with call bell/phone within reach;with family/visitor present Nurse Communication: Mobility status PT Visit Diagnosis: Pain;Difficulty in walking, not elsewhere classified (R26.2) Pain - Right/Left: Right Pain - part of  body: Knee    Time: 6394-3200 PT Time Calculation (min) (ACUTE ONLY): 39 min   Charges:   PT Evaluation $PT Eval Low Complexity: 1 Low PT Treatments $Gait Training: 8-22 mins $Therapeutic Exercise: 8-22 mins        Coolidge Breeze, PT, DPT [facility] Rehabilitation Department Office: [phone] Weekend pager: [phone]  Coolidge Breeze 05/01/2022, 4:29 PM

## 2022-05-01 NOTE — Op Note (Signed)
05/01/2022  11:28 AM  PATIENT:  Cody Willis    PRE-OPERATIVE DIAGNOSIS:  OA RIGHT KNEE  POST-OPERATIVE DIAGNOSIS:  Same  PROCEDURE:  UNICOMPARTMENTAL KNEE  SURGEON:  Renette Butters, MD  PHYSICIAN ASSISTANT: Aggie Moats, PA-C, he was present and scrubbed throughout the case, critical for completion in a timely fashion, and for retraction, instrumentation, and closure.   ANESTHESIA:   General  PREOPERATIVE INDICATIONS:  Cody Willis is a  82 y.o. male with a diagnosis of OA RIGHT KNEE who failed conservative measures and elected for surgical management.    The risks benefits and alternatives were discussed with the patient preoperatively including but not limited to the risks of infection, bleeding, nerve injury, cardiopulmonary complications, blood clots, the need for revision surgery, among others, and the patient was willing to proceed.  OPERATIVE IMPLANTS: Biomet Oxford mobile bearing medial compartment arthroplasty. Femoral Component: med. Tibial tray: C, Size 4 poly.   OPERATIVE FINDINGS: Endstage grade 4 medial compartment osteoarthritis. No significant changes in the lateral or patellofemoral joint  OPERATIVE PROCEDURE: The patient was brought to the operating room placed in supine position. General anesthesia was administered. IV antibiotics were given. The lower extremity was placed in the legholder and prepped and draped in usual sterile fashion.  Time out was performed.  The leg was elevated and exsanguinated and the tourniquet was inflated. Anteromedial incision was performed, and I took care to preserve the MCL. Parapatellar incision was carried out, and the osteophytes were excised, along with the medial meniscus and a small portion of the fat pad.  The extra medullary tibial cutting jig was applied, using the spoon and the 71m G-Clamp, and I took care to protect the anterior cruciate ligament insertion and the tibial spine. The medial collateral ligament was  also protected, and I resected my proximal tibia, matching the anatomic slope.   The proximal tibial bony cut was removed in one piece, and I turned my attention to the femur.  The intramedullary femoral rod was placed using the drill, and then using the appropriate reference, I assembled the femoral jig, setting my posterior cutting block. I resected my posterior femur, and then measured my gap.   I then used the mill to match the extension gap to the flexion gap. The gaps were then measured again with the appropriate feeler gauges. Once I had balanced flexion and extension gaps, I then completed the preparation of the femur.  I milled off the anterior aspect of the distal femur to prevent impingement. I also exposed the tibia, and selected the above-named component, and then used the cutting jig to prepare the keel slot on the tibia. I also used the awl to curette out the bone to complete the preparation of the keel. The back wall was intact.  I then placed trial components, and it was found to have excellent motion, and appropriate balance.  I then cemented the components into place, cementing the tibia first, removing all excess cement, and then cementing the femur.  All loose cement was removed.  The real polyethylene insert was applied manually, and the knee was taken through functional range of motion, and found to have excellent stability and restoration of joint motion, with excellent balance.  The wounds were irrigated copiously, and the parapatellar tissue closed with Vicryl, followed by Vicryl for the subcutaneous tissue, with routine closure with Steri-Strips and sterile gauze.  The tourniquet was released, and the patient was awakened and extubated and returned to PACU in  stable and satisfactory condition. There were no complications.  POSTOPERATIVE PLAN: DVT px will consist of SCD's, mobiliation and chemical px, WBAT     Renette Butters, MD

## 2022-05-01 NOTE — Anesthesia Procedure Notes (Signed)
Anesthesia Regional Block: Adductor canal block   Pre-Anesthetic Checklist: , timeout performed,  Correct Patient, Correct Site, Correct Laterality,  Correct Procedure, Correct Position, site marked,  Risks and benefits discussed,  Surgical consent,  Pre-op evaluation,  At surgeon's request and post-op pain management  Laterality: Lower and Right  Prep: chloraprep       Needles:  Injection technique: Single-shot  Needle Type: Echogenic Needle     Needle Length: 9cm  Needle Gauge: 22     Additional Needles:   Procedures:,,,, ultrasound used (permanent image in chart),,    Narrative:  Start time: 05/01/2022 9:09 AM End time: 05/01/2022 9:13 AM Injection made incrementally with aspirations every 5 mL.  Performed by: Personally  Anesthesiologist: Barnet Glasgow, MD  Additional Notes: Block assessed prior to surgery. Pt tolerated procedure well.

## 2022-05-01 NOTE — Transfer of Care (Signed)
Immediate Anesthesia Transfer of Care Note  Patient: Cody Willis  Procedure(s) Performed: UNICOMPARTMENTAL KNEE (Right: Knee)  Patient Location: PACU  Anesthesia Type:Spinal  Level of Consciousness: awake and alert   Airway & Oxygen Therapy: Patient Spontanous Breathing and Patient connected to face mask oxygen  Post-op Assessment: Report given to RN and Post -op Vital signs reviewed and stable  Post vital signs: Reviewed and stable  Last Vitals:  Vitals Value Taken Time  BP 110/65 05/01/22 1218  Temp    Pulse 81 05/01/22 1218  Resp 24 05/01/22 1219  SpO2 99 % 05/01/22 1218  Vitals shown include unvalidated device data.  Last Pain:  Vitals:   05/01/22 0918  TempSrc:   PainSc: 5       Patients Stated Pain Goal: 5 (31/[birth year]/42 5525)  Complications: No notable events documented.

## 2022-05-01 NOTE — Progress Notes (Signed)
Orthopedic Tech Progress Note Patient Details:  Cody Willis [birth year] [id number]  Patient ID: Cody Willis, male   DOB: Mar 07, 1941, 82 y.o.   MRN: [id number]  Cody Willis 05/01/2022, 2:00 PM Right bone foam applied in pacu

## 2022-05-01 NOTE — Anesthesia Postprocedure Evaluation (Signed)
Anesthesia Post Note  Patient: Cody Willis  Procedure(s) Performed: UNICOMPARTMENTAL KNEE (Right: Knee)     Patient location during evaluation: Nursing Unit Anesthesia Type: Regional and Spinal Level of consciousness: oriented and awake and alert Pain management: pain level controlled Vital Signs Assessment: post-procedure vital signs reviewed and stable Respiratory status: spontaneous breathing and respiratory function stable Cardiovascular status: blood pressure returned to baseline and stable Postop Assessment: no headache, no backache, no apparent nausea or vomiting and patient able to bend at knees Anesthetic complications: no  No notable events documented.  Last Vitals:  Vitals:   05/01/22 1315 05/01/22 1330  BP: 105/63 111/60  Pulse: 68 71  Resp: 17 15  Temp:    SpO2: 95% 94%    Last Pain:  Vitals:   05/01/22 1330  TempSrc:   PainSc: 0-No pain                 Barnet Glasgow

## 2022-05-01 NOTE — Interval H&P Note (Signed)
History and Physical Interval Note:  05/01/2022 9:01 AM  [place]  has presented today for surgery, with the diagnosis of OA RIGHT KNEE.  The various methods of treatment have been discussed with the patient and family. After consideration of risks, benefits and other options for treatment, the patient has consented to  Procedure(s): UNICOMPARTMENTAL KNEE (Right) as a surgical intervention.  The patient's history has been reviewed, patient examined, no change in status, stable for surgery.  I have reviewed the patient's chart and labs.  Questions were answered to the patient's satisfaction.     Renette Butters

## 2022-05-01 NOTE — Discharge Instructions (Addendum)

## 2022-05-01 NOTE — Anesthesia Procedure Notes (Addendum)
Spinal  Patient location during procedure: OR Start time: 05/01/2022 10:09 AM End time: 05/01/2022 10:13 AM Reason for block: surgical anesthesia Staffing Performed: resident/CRNA and anesthesiologist  Anesthesiologist: Josephine Igo, MD Resident/CRNA: [place] Stephanie C, CRNA Performed by: [place] Stephanie C, CRNA Authorized by: Barnet Glasgow, MD   Preanesthetic Checklist Completed: patient identified, IV checked, site marked, risks and benefits discussed, surgical consent, monitors and equipment checked, pre-op evaluation and timeout performed Spinal Block Patient position: sitting Prep: DuraPrep and site prepped and draped Patient monitoring: heart rate, cardiac monitor, continuous pulse ox and blood pressure Approach: midline Location: L3-4 Injection technique: single-shot Needle Needle type: Pencan  Needle gauge: 24 G Needle length: 9 cm Needle insertion depth: 7 cm Assessment Sensory level: T4 Events: CSF return and second provider Additional Notes IV functioning, monitors applied to pt. Expiration date of kit checked and confirmed to be in date. Sterile prep and drape, hand hygiene and sterile gloved used. Pt was positioned and spine was prepped in sterile fashion. Skin was anesthetized with lidocaine. Free flow of clear CSF obtained prior to injecting local anesthetic into CSF x 2attempt. Spinal needle aspirated freely following injection. Needle was carefully withdrawn, and pt tolerated procedure well. Loss of motor and sensory on exam post injection.

## 2022-05-02 ENCOUNTER — Encounter (HOSPITAL_COMMUNITY): Payer: Self-pay | Admitting: Orthopedic Surgery

## 2022-05-03 DIAGNOSIS — R262 Difficulty in walking, not elsewhere classified: Secondary | ICD-10-CM | POA: Diagnosis not present

## 2022-05-03 DIAGNOSIS — M25661 Stiffness of right knee, not elsewhere classified: Secondary | ICD-10-CM | POA: Diagnosis not present

## 2022-05-03 DIAGNOSIS — M1711 Unilateral primary osteoarthritis, right knee: Secondary | ICD-10-CM | POA: Diagnosis not present

## 2022-05-03 DIAGNOSIS — M6281 Muscle weakness (generalized): Secondary | ICD-10-CM | POA: Diagnosis not present

## 2022-05-07 DIAGNOSIS — R262 Difficulty in walking, not elsewhere classified: Secondary | ICD-10-CM | POA: Diagnosis not present

## 2022-05-07 DIAGNOSIS — M6281 Muscle weakness (generalized): Secondary | ICD-10-CM | POA: Diagnosis not present

## 2022-05-07 DIAGNOSIS — M1711 Unilateral primary osteoarthritis, right knee: Secondary | ICD-10-CM | POA: Diagnosis not present

## 2022-05-07 DIAGNOSIS — M25661 Stiffness of right knee, not elsewhere classified: Secondary | ICD-10-CM | POA: Diagnosis not present

## 2022-05-09 DIAGNOSIS — M1711 Unilateral primary osteoarthritis, right knee: Secondary | ICD-10-CM | POA: Diagnosis not present

## 2022-05-09 DIAGNOSIS — R262 Difficulty in walking, not elsewhere classified: Secondary | ICD-10-CM | POA: Diagnosis not present

## 2022-05-09 DIAGNOSIS — M25661 Stiffness of right knee, not elsewhere classified: Secondary | ICD-10-CM | POA: Diagnosis not present

## 2022-05-09 DIAGNOSIS — M6281 Muscle weakness (generalized): Secondary | ICD-10-CM | POA: Diagnosis not present

## 2022-05-14 DIAGNOSIS — R262 Difficulty in walking, not elsewhere classified: Secondary | ICD-10-CM | POA: Diagnosis not present

## 2022-05-14 DIAGNOSIS — M25661 Stiffness of right knee, not elsewhere classified: Secondary | ICD-10-CM | POA: Diagnosis not present

## 2022-05-14 DIAGNOSIS — M6281 Muscle weakness (generalized): Secondary | ICD-10-CM | POA: Diagnosis not present

## 2022-05-14 DIAGNOSIS — M1711 Unilateral primary osteoarthritis, right knee: Secondary | ICD-10-CM | POA: Diagnosis not present

## 2022-05-16 DIAGNOSIS — M1711 Unilateral primary osteoarthritis, right knee: Secondary | ICD-10-CM | POA: Diagnosis not present

## 2022-05-17 DIAGNOSIS — M6281 Muscle weakness (generalized): Secondary | ICD-10-CM | POA: Diagnosis not present

## 2022-05-17 DIAGNOSIS — R262 Difficulty in walking, not elsewhere classified: Secondary | ICD-10-CM | POA: Diagnosis not present

## 2022-05-17 DIAGNOSIS — M25661 Stiffness of right knee, not elsewhere classified: Secondary | ICD-10-CM | POA: Diagnosis not present

## 2022-05-17 DIAGNOSIS — M1711 Unilateral primary osteoarthritis, right knee: Secondary | ICD-10-CM | POA: Diagnosis not present

## 2022-05-24 DIAGNOSIS — M1711 Unilateral primary osteoarthritis, right knee: Secondary | ICD-10-CM | POA: Diagnosis not present

## 2022-05-24 DIAGNOSIS — M6281 Muscle weakness (generalized): Secondary | ICD-10-CM | POA: Diagnosis not present

## 2022-05-24 DIAGNOSIS — M25661 Stiffness of right knee, not elsewhere classified: Secondary | ICD-10-CM | POA: Diagnosis not present

## 2022-05-24 DIAGNOSIS — R262 Difficulty in walking, not elsewhere classified: Secondary | ICD-10-CM | POA: Diagnosis not present

## 2022-05-25 DIAGNOSIS — H6123 Impacted cerumen, bilateral: Secondary | ICD-10-CM | POA: Diagnosis not present

## 2022-06-13 DIAGNOSIS — M1711 Unilateral primary osteoarthritis, right knee: Secondary | ICD-10-CM | POA: Diagnosis not present

## 2022-06-27 ENCOUNTER — Ambulatory Visit: Payer: PPO | Admitting: Family Medicine

## 2022-06-28 ENCOUNTER — Ambulatory Visit (HOSPITAL_COMMUNITY)
Admission: RE | Admit: 2022-06-28 | Discharge: 2022-06-28 | Disposition: A | Discharge status: Home / Self Care | Payer: HMO | Source: Ambulatory Visit | Attending: Internal Medicine | Admitting: Internal Medicine

## 2022-06-28 ENCOUNTER — Encounter (HOSPITAL_COMMUNITY): Payer: Self-pay

## 2022-06-28 ENCOUNTER — Other Ambulatory Visit: Payer: Self-pay

## 2022-06-28 VITALS — BP 164/72 | HR 73 | Temp 97.5°F | Resp 18

## 2022-06-28 DIAGNOSIS — R059 Cough, unspecified: Secondary | ICD-10-CM | POA: Diagnosis not present

## 2022-06-28 DIAGNOSIS — J441 Chronic obstructive pulmonary disease with (acute) exacerbation: Secondary | ICD-10-CM | POA: Insufficient documentation

## 2022-06-28 DIAGNOSIS — J029 Acute pharyngitis, unspecified: Secondary | ICD-10-CM | POA: Diagnosis not present

## 2022-06-28 DIAGNOSIS — Z8616 Personal history of COVID-19: Secondary | ICD-10-CM | POA: Insufficient documentation

## 2022-06-28 DIAGNOSIS — Z1152 Encounter for screening for COVID-19: Secondary | ICD-10-CM | POA: Diagnosis not present

## 2022-06-28 DIAGNOSIS — B9789 Other viral agents as the cause of diseases classified elsewhere: Secondary | ICD-10-CM | POA: Insufficient documentation

## 2022-06-28 DIAGNOSIS — J028 Acute pharyngitis due to other specified organisms: Secondary | ICD-10-CM | POA: Insufficient documentation

## 2022-06-28 LAB — SARS CORONAVIRUS 2 (TAT 6-24 HRS): SARS Coronavirus 2: NEGATIVE

## 2022-06-28 MED ORDER — DOXYCYCLINE HYCLATE 100 MG PO CAPS: 100.0000 mg | ORAL_CAPSULE | Freq: Two times a day (BID) | ORAL | 0 refills | Status: AC

## 2022-06-28 MED ORDER — BENZONATATE 100 MG PO CAPS: 100.0000 mg | ORAL_CAPSULE | Freq: Three times a day (TID) | ORAL | 0 refills | Status: DC | PRN

## 2022-06-28 MED ORDER — CETIRIZINE HCL 10 MG PO TABS: 10.0000 mg | ORAL_TABLET | Freq: Every day | ORAL | 0 refills | Status: DC

## 2022-06-28 MED ORDER — PREDNISONE 20 MG PO TABS: 40.0000 mg | ORAL_TABLET | Freq: Every day | ORAL | 0 refills | Status: AC

## 2022-06-28 NOTE — ED Notes (Signed)
Requesting prednisone and antibiotic.  Reports he has issues this time of year, every year

## 2022-06-28 NOTE — ED Provider Notes (Signed)
[place]    CSN: [id number] Arrival date & time: 06/28/22  0930      History   Chief Complaint Chief Complaint  Patient presents with   Cough   Appointment    10:00    HPI Cody Willis is a 82 y.o. male with a history of COPD comes to urgent care with 3-day history of cough productive of yellowish sputum, nasal congestion, headache and generalized bodyaches.  Patient denies any shortness of breath.  He does not endorse wheezing but has some chest tightness.  He denies any chest pain.  Patient has used his inhaler.  No itchy eyes, no sore throat.  No nausea, vomiting or diarrhea.  No sick contacts. HPI  Past Medical History:  Diagnosis Date   Arthritis    fingers   Basal cell carcinoma 02/10/2008   left top of shoulder   Carpal tunnel syndrome    Cataract    bilateral surgery repair   Chronic pancreatitis ([place])    COPD (chronic obstructive pulmonary disease) ([place])    COVID-19    Esophageal reflux    Gastric polyps    hyperplastic   Herpes zoster    Hiatal hernia    Hypertension    Other B-complex deficiencies    Pneumonia    Pure hypercholesterolemia    Stroke ([place]) 01/16/2015   only on ASA    Patient Active Problem List   Diagnosis Date Noted   S/P right unicompartmental knee replacement 05/01/2022   Hypertension 03/28/2022   Rash 03/12/2022   Right knee pain 01/12/2022   Coronary artery disease involving native coronary artery of native heart without angina pectoris 02/02/2021   Dyspnea 02/02/2021   Carotid bruit 02/02/2021   Pulmonary nodules 10/28/2020   Aortic atherosclerosis ([place]) 10/28/2020   Prediabetes 05/29/2018   Hx of melanoma excision 05/29/2018   Leucocytosis 05/29/2018   Proteinuria 04/29/2017   Former smoker 10/31/2015   Preventative health care 07/26/2015   Advance directive discussed with patient 07/26/2015   Occipital neuralgia of right side 05/02/2015   History of CVA (cerebrovascular accident) 03/21/2015    CHRONIC PANCREATITIS 01/03/2010   ACID REFLUX DISEASE 07/02/2007    Past Surgical History:  Procedure Laterality Date   APPENDECTOMY     BACK SURGERY     Diskectomy between L4 and L5   CARPAL TUNNEL RELEASE Left 06/27/2021   Procedure: CARPAL TUNNEL RELEASE LEFT;  Surgeon: Daryll Brod, MD;  Location: [facility];  Service: Orthopedics;  Laterality: Left;  Regional with monitored anesthesia care   CATARACT EXTRACTION W/ INTRAOCULAR LENS  IMPLANT, BILATERAL  12/15 and 2/16   CHOLECYSTECTOMY     COLONOSCOPY  2011   MELANOMA EXCISION  ~2009   right upper abdomen   PARTIAL KNEE ARTHROPLASTY Right 05/01/2022   Procedure: UNICOMPARTMENTAL KNEE;  Surgeon: Renette Butters, MD;  Location: [facility];  Service: Orthopedics;  Laterality: Right;       Home Medications    Prior to Admission medications   Medication Sig Start Date End Date Taking? Authorizing Provider  benzonatate (TESSALON) 100 MG capsule Take 1 capsule (100 mg total) by mouth 3 (three) times daily as needed for cough. 06/28/22  Yes Emeric Novinger, Myrene Galas, MD  cetirizine (ZYRTEC ALLERGY) 10 MG tablet Take 1 tablet (10 mg total) by mouth daily. 06/28/22  Yes Kinslee Dalpe, Myrene Galas, MD  doxycycline (VIBRAMYCIN) 100 MG capsule Take 1 capsule (100 mg total) by mouth 2 (two) times daily for 5 days.  06/28/22 07/03/22 Yes Vallie Fayette, Myrene Galas, MD  predniSONE (DELTASONE) 20 MG tablet Take 2 tablets (40 mg total) by mouth daily for 5 days. 06/28/22 07/03/22 Yes Esaul Dorwart, Myrene Galas, MD  acetaminophen (TYLENOL) 500 MG tablet Take 2 tablets (1,000 mg total) by mouth every 6 (six) hours as needed for mild pain or moderate pain. 05/01/22   Britt Bottom, PA-C  albuterol (VENTOLIN HFA) 108 (90 Base) MCG/ACT inhaler Inhale 1-2 puffs into the lungs every 4 (four) hours as needed for wheezing or shortness of breath. 03/03/21   Melynda Ripple, MD  aspirin EC 81 MG tablet Take 1 tablet (81 mg total) by mouth 2 (two) times daily. To prevent blood clots for  30 days after surgery. 05/01/22   Britt Bottom, PA-C  cyanocobalamin (VITAMIN B12) 1000 MCG/ML injection INJECT 1ML INTO THE MUSCLE EVERY 30 DAYS. PHARMACY-PLEASE PROVIDE APPROPRIATE NEEDLES AND SYRINGES 11/16/21   Noralyn Pick, NP  diclofenac (VOLTAREN) 75 MG EC tablet Take 1 tablet (75 mg total) by mouth 2 (two) times daily as needed for mild pain (and swelling). Patient not taking: Reported on 06/28/2022 05/01/22   Britt Bottom, PA-C  fluorouracil (EFUDEX) 5 % cream Apply to the forehead BID x 1 week. Patient not taking: Reported on 04/18/2022 01/11/22   Ralene Bathe, MD  ketoconazole (NIZORAL) 2 % shampoo apply three times per week, massage into scalp and leave in for 10 minutes before rinsing out Patient taking differently: Apply 1 Application topically 2 (two) times a week.  massage into scalp and leave in for 10 minutes before rinsing out 01/11/22   Ralene Bathe, MD  methocarbamol (ROBAXIN-750) 750 MG tablet Take 1 tablet (750 mg total) by mouth every 8 (eight) hours as needed for muscle spasms. Patient not taking: Reported on 06/28/2022 05/01/22   Britt Bottom, PA-C  ondansetron (ZOFRAN-ODT) 4 MG disintegrating tablet Take 1 tablet (4 mg total) by mouth every 8 (eight) hours as needed for nausea or vomiting. 05/01/22   Britt Bottom, PA-C  oxyCODONE (ROXICODONE) 5 MG immediate release tablet Take 1 tablet (5 mg total) by mouth every 4 (four) hours as needed for severe pain. Patient not taking: Reported on 06/28/2022 05/01/22   Britt Bottom, PA-C  Pancrelipase, Lip-Prot-Amyl, ([name]) 25000-79000 units CPEP Take 2 capsules by mouth with breakfast, with lunch, and with evening meal. 11/16/21   Noralyn Pick, NP  pantoprazole (PROTONIX) 40 MG tablet Take 1 tablet (40 mg total) by mouth every morning. 11/16/21   Noralyn Pick, NP  rosuvastatin (CRESTOR) 20 MG tablet Take 1 tablet (20 mg total) by mouth daily. Please call [phone] to schedule an  overdue appointment with Dr. Candee Furbish for future refills. Thank you. 2nd attempt. 02/22/22   Leone Haven, MD  sennosides-docusate sodium (SENOKOT-S) 8.6-50 MG tablet Take 2 tablets by mouth daily as needed for constipation (while taking narcotics). 05/01/22   Britt Bottom, PA-C  triamcinolone (KENALOG) 0.025 % ointment Apply 1 Application topically daily as needed for rash. 03/27/18   [provider]  valsartan (DIOVAN) 80 MG tablet Take 1 tablet (80 mg total) by mouth daily. 03/28/22   Leone Haven, MD    Family History Family History  Problem Relation Age of Onset   Stroke Mother    Diabetes Mother    Emphysema Father        smoker   Crohn's disease Sister    Colitis Brother    Colon cancer  Neg Hx    Colon polyps Neg Hx    Esophageal cancer Neg Hx    Rectal cancer Neg Hx    Stomach cancer Neg Hx     Social History Social History   Tobacco Use   Smoking status: Former    Types: Cigarettes   Smokeless tobacco: Never  Vaping Use   Vaping Use: Never used  Substance Use Topics   Alcohol use: No    Alcohol/week: 0.0 standard drinks of alcohol   Drug use: Never     Allergies   Meloxicam, Plavix [clopidogrel bisulfate], and Sulfamethoxazole-trimethoprim   Review of Systems Review of Systems As per HPI  Physical Exam Triage Vital Signs ED Triage Vitals  Enc Vitals Group     BP 06/28/22 1025 (!) 167/76     Pulse Rate 06/28/22 1025 73     Resp 06/28/22 1025 18     Temp 06/28/22 1025 (!) 97.5 F (36.4 C)     Temp src --      SpO2 06/28/22 1025 96 %     Weight --      Height --      Head Circumference --      Peak Flow --      Pain Score 06/28/22 1021 5     Pain Loc --      Pain Edu? --      Excl. in [place]? --    No data found.  Updated Vital Signs BP (!) 164/72 (BP Location: Left Arm)   Pulse 73   Temp (!) 97.5 F (36.4 C)   Resp 18   SpO2 96%   Visual Acuity Right Eye Distance:   Left Eye Distance:   Bilateral Distance:     Right Eye Near:   Left Eye Near:    Bilateral Near:     Physical Exam Vitals and nursing note reviewed.  Constitutional:      Appearance: Normal appearance. He is not toxic-appearing or diaphoretic.  HENT:     Right Ear: Tympanic membrane normal.     Left Ear: Tympanic membrane normal.     Mouth/Throat:     Mouth: Mucous membranes are moist.     Pharynx: No posterior oropharyngeal erythema.  Cardiovascular:     Rate and Rhythm: Normal rate and regular rhythm.     Pulses: Normal pulses.     Heart sounds: Normal heart sounds.  Pulmonary:     Effort: Pulmonary effort is normal.     Breath sounds: Normal breath sounds.  Abdominal:     General: Bowel sounds are normal.     Palpations: Abdomen is soft.  Neurological:     Mental Status: He is alert.      UC Treatments / Results  Labs (all labs ordered are listed, but only abnormal results are displayed) Labs Reviewed  SARS CORONAVIRUS 2 (TAT 6-24 HRS)    EKG   Radiology No results found.  Procedures Procedures (including critical care time)  Medications Ordered in UC Medications - No data to display  Initial Impression / Assessment and Plan / UC Course  I have reviewed the triage vital signs and the nursing notes.  Pertinent labs & imaging results that were available during my care of the patient were reviewed by me and considered in my medical decision making (see chart for details).     1.  COPD with acute exacerbation 2.  Viral URI with cough: Prednisone 40 mg orally daily for 5 days Albuterol  inhaler every 4-6 hours as needed Maintain adequate oral fluid intake Doxycycline 100 mg twice daily for 5 days Tessalon Perles as needed for cough COVID-19 PCR test has been sent Patient will be called with recommendations if labs are abnormal Return precautions given. Final Clinical Impressions(s) / UC Diagnoses   Final diagnoses:  COPD with acute exacerbation ([place])  Viral pharyngitis     Discharge  Instructions      Please take medications as prescribed Increase oral fluid intake Please use your albuterol inhaler as needed for chest tightness Please return to urgent care if you have worsening symptoms Will call you with recommendations if labs are abnormal.   ED Prescriptions     Medication Sig Dispense Auth. Provider   predniSONE (DELTASONE) 20 MG tablet Take 2 tablets (40 mg total) by mouth daily for 5 days. 10 tablet Otillia Cordone, Myrene Galas, MD   doxycycline (VIBRAMYCIN) 100 MG capsule Take 1 capsule (100 mg total) by mouth 2 (two) times daily for 5 days. 10 capsule Liv Rallis, Myrene Galas, MD   benzonatate (TESSALON) 100 MG capsule Take 1 capsule (100 mg total) by mouth 3 (three) times daily as needed for cough. 21 capsule Brittain Smithey, Myrene Galas, MD   cetirizine (ZYRTEC ALLERGY) 10 MG tablet Take 1 tablet (10 mg total) by mouth daily. 30 tablet Safiyya Stokes, Myrene Galas, MD      PDMP not reviewed this encounter.   Chase Picket, MD 06/28/22 1134

## 2022-06-28 NOTE — Discharge Instructions (Addendum)
Please take medications as prescribed Increase oral fluid intake Please use your albuterol inhaler as needed for chest tightness Please return to urgent care if you have worsening symptoms Will call you with recommendations if labs are abnormal.

## 2022-06-28 NOTE — ED Triage Notes (Signed)
Complains of cough, phlegm is yellowish, runny nose and headache..  Symptoms for 3 days  Has taken mucinex, robitussin, night time cold and flu from [facility].

## 2022-07-16 ENCOUNTER — Ambulatory Visit (INDEPENDENT_AMBULATORY_CARE_PROVIDER_SITE_OTHER): Payer: HMO | Admitting: Dermatology

## 2022-07-16 ENCOUNTER — Encounter: Payer: Self-pay | Admitting: Dermatology

## 2022-07-16 DIAGNOSIS — Z5111 Encounter for antineoplastic chemotherapy: Secondary | ICD-10-CM

## 2022-07-16 DIAGNOSIS — L578 Other skin changes due to chronic exposure to nonionizing radiation: Secondary | ICD-10-CM | POA: Diagnosis not present

## 2022-07-16 DIAGNOSIS — Z79899 Other long term (current) drug therapy: Secondary | ICD-10-CM | POA: Diagnosis not present

## 2022-07-16 DIAGNOSIS — L570 Actinic keratosis: Secondary | ICD-10-CM

## 2022-07-16 DIAGNOSIS — L820 Inflamed seborrheic keratosis: Secondary | ICD-10-CM

## 2022-07-16 DIAGNOSIS — Z7189 Other specified counseling: Secondary | ICD-10-CM | POA: Diagnosis not present

## 2022-07-16 MED ORDER — FLUOROURACIL 5 % EX CREA: TOPICAL_CREAM | CUTANEOUS | 0 refills | Status: DC

## 2022-07-16 NOTE — Patient Instructions (Addendum)
Cryotherapy Aftercare  Wash gently with soap and water everyday.   Apply Vaseline and Band-Aid daily until healed.    - Start 5-fluorouracil cream once a day for 3 weeks to affected areas including forehead, temples.  Reviewed course of treatment and expected reaction.  Patient advised to expect inflammation and crusting and advised that erosions are possible.  Patient advised to be diligent with sun protection during and after treatment. Handout with details of how to apply medication and what to expect provided. Counseled to keep medication out of reach of children and pets.  Due to recent changes in healthcare laws, you may see results of your pathology and/or laboratory studies on MyChart before the doctors have had a chance to review them. We understand that in some cases there may be results that are confusing or concerning to you. Please understand that not all results are received at the same time and often the doctors may need to interpret multiple results in order to provide you with the best plan of care or course of treatment. Therefore, we ask that you please give [place] 2 business days to thoroughly review all your results before contacting the office for clarification. Should we see a critical lab result, you will be contacted sooner.   If You Need Anything After Your Visit  If you have any questions or concerns for your doctor, please call our main line at [phone] and press option 4 to reach your doctor's medical assistant. If no one answers, please leave a voicemail as directed and we will return your call as soon as possible. Messages left after 4 pm will be answered the following business day.   You may also send [place] a message via MyChart. We typically respond to MyChart messages within 1-2 business days.  For prescription refills, please ask your pharmacy to contact our office. Our fax number is [phone].  If you have an urgent issue when the clinic is closed that cannot  wait until the next business day, you can page your doctor at the number below.    Please note that while we do our best to be available for urgent issues outside of office hours, we are not available 24/7.   If you have an urgent issue and are unable to reach [place], you may choose to seek medical care at your doctor's office, retail clinic, urgent care center, or emergency room.  If you have a medical emergency, please immediately call 911 or go to the emergency department.  Pager Numbers  - Dr. Gwen Pounds: [phone]  - Dr. Neale Burly: [phone]  - Dr. Roseanne Reno: [phone]  In the event of inclement weather, please call our main line at [phone] for an update on the status of any delays or closures.  Dermatology Medication Tips: Please keep the boxes that topical medications come in in order to help keep track of the instructions about where and how to use these. Pharmacies typically print the medication instructions only on the boxes and not directly on the medication tubes.   If your medication is too expensive, please contact our office at [phone] option 4 or send [place] a message through MyChart.   We are unable to tell what your co-pay for medications will be in advance as this is different depending on your insurance coverage. However, we may be able to find a substitute medication at lower cost or fill out paperwork to get insurance to cover a needed medication.   If a prior authorization is required to  get your medication covered by your insurance company, please allow [place] 1-2 business days to complete this process.  Drug prices often vary depending on where the prescription is filled and some pharmacies may offer cheaper prices.  The website www.goodrx.com contains coupons for medications through different pharmacies. The prices here do not account for what the cost may be with help from insurance (it may be cheaper with your insurance), but the website can give you the price  if you did not use any insurance.  - You can print the associated coupon and take it with your prescription to the pharmacy.  - You may also stop by our office during regular business hours and pick up a GoodRx coupon card.  - If you need your prescription sent electronically to a different pharmacy, notify our office through [facility] or by phone at [phone] option 4.     Si Usted Necesita Algo Despus de Su Visita  Tambin puede enviarnos un mensaje a travs de Clinical cytogeneticist. Por lo general respondemos a los mensajes de MyChart en el transcurso de 1 a 2 das hbiles.  Para renovar recetas, por favor pida a su farmacia que se ponga en contacto con nuestra oficina. Annie Sable de fax es [place] [phone].  Si tiene un asunto urgente cuando la clnica est cerrada y que no puede esperar hasta el siguiente da hbil, puede llamar/localizar a su doctor(a) al nmero que aparece a continuacin.   Por favor, tenga en cuenta que aunque hacemos todo lo posible para estar disponibles para asuntos urgentes fuera del horario de [place], no estamos disponibles las 24 horas del da, los 7 [address]  [address] de la [place].   Si tiene un problema urgente y no puede comunicarse con nosotros, puede optar por buscar atencin mdica  en el consultorio de su doctor(a), en una clnica privada, en un centro de atencin urgente o en una sala de emergencias.  Si tiene Engineer, drilling, por favor llame inmediatamente al 911 o vaya a la sala de emergencias.  Nmeros de bper  - Dr. Gwen Pounds: [phone]  - Dra. Moye: [phone]  - Dra. Roseanne Reno: [phone]  En caso de inclemencias del [place], por favor llame a Lacy Duverney principal al [phone] para una actualizacin sobre el [place] de cualquier retraso o cierre.  Consejos para la medicacin en dermatologa: Por favor, guarde las cajas en las que vienen los medicamentos de uso tpico para ayudarle a seguir las instrucciones sobre dnde y cmo usarlos.  Las farmacias generalmente imprimen las instrucciones del medicamento slo en las cajas y no directamente en los tubos del [place].   Si su medicamento es muy caro, por favor, pngase en contacto con Rolm Gala llamando al [phone] y presione la opcin 4 o envenos un mensaje a travs de Clinical cytogeneticist.   No podemos decirle cul ser su copago por los medicamentos por adelantado ya que esto es diferente dependiendo de la cobertura de su seguro. Sin embargo, es posible que podamos encontrar un medicamento sustituto a Audiological scientist un formulario para que el seguro cubra el medicamento que se considera necesario.   Si se requiere una autorizacin previa para que su compaa de seguros [place] su medicamento, por favor permtanos de 1 a 2 das hbiles para completar [address]  Los precios de los medicamentos varan con frecuencia dependiendo del Environmental consultant de dnde se surte la receta y alguna farmacias pueden ofrecer precios ms baratos.  El sitio web www.goodrx.com tiene cupones para medicamentos de Health and safety inspector.  en cuenta lo que podra costar con la ayuda del seguro (puede ser ms barato con su seguro), pero el sitio web puede darle el precio si no utiliz ningn seguro.  - Puede imprimir el cupn correspondiente y llevarlo con su receta a la farmacia.  - Tambin puede pasar por nuestra oficina durante el horario de atencin regular y recoger una tarjeta de cupones de GoodRx.  - Si necesita que su receta se enve electrnicamente a una farmacia diferente, informe a nuestra oficina a travs de MyChart de Kiley Solimine o por telfono llamando al [phone] y presione la opcin 4.  

## 2022-07-16 NOTE — Progress Notes (Signed)
Follow-Up Visit   Subjective  Cody Willis is a 82 y.o. male who presents for the following: Actinic keratosis 6 month follow up. Patient was prescribed 5FU/calcipotriene at last visit but never got a call and never got prescription.  The patient has spots, moles and lesions to be evaluated, some may be new or changing and the patient has concerns that these could be cancer.  The following portions of the chart were reviewed this encounter and updated as appropriate: medications, allergies, medical history  Review of Systems:  No other skin or systemic complaints except as noted in HPI or Assessment and Plan.  Objective  Well appearing patient in no apparent distress; mood and affect are within normal limits. A focused examination was performed of the following areas: Face, ears Relevant exam findings are noted in the Assessment and Plan.   Assessment & Plan    ACTINIC KERATOSIS  Exam: Erythematous thin papules/macules with gritty scale  Actinic keratoses are precancerous spots that appear secondary to cumulative UV radiation exposure/sun exposure over time. They are chronic with expected duration over 1 year. A portion of actinic keratoses will progress to squamous cell carcinoma of the skin. It is not possible to reliably predict which spots will progress to skin cancer and so treatment is recommended to prevent development of skin cancer.  Recommend daily broad spectrum sunscreen SPF 30+ to sun-exposed areas, reapply every 2 hours as needed.  Recommend staying in the shade or wearing long sleeves, sun glasses (UVA+UVB protection) and wide brim hats (4-inch brim around the entire circumference of the hat). Call for new or changing lesions.  Treatment Plan: Destruction Procedure Note Destruction method: cryotherapy   Informed consent: discussed and consent obtained   Lesion destroyed using liquid nitrogen: Yes   Outcome: patient tolerated procedure well with no  complications   Post-procedure details: wound care instructions given   Locations: face, ears # of Lesions Treated: 19  Prior to procedure, discussed risks of blister formation, small wound, skin dyspigmentation, or rare scar following cryotherapy. Recommend Vaseline ointment to treated areas while healing.  ACTINIC DAMAGE WITH PRECANCEROUS ACTINIC KERATOSES Counseling for Topical Chemotherapy Management: Patient exhibits: - Severe, confluent actinic changes with pre-cancerous actinic keratoses that is secondary to cumulative UV radiation exposure over time - Condition that is severe; chronic, not at goal. - diffuse scaly erythematous macules and papules with underlying dyspigmentation - Discussed Prescription "Field Treatment" topical Chemotherapy for Severe, Chronic Confluent Actinic Changes with Pre-Cancerous Actinic Keratoses Field treatment involves treatment of an entire area of skin that has confluent Actinic Changes (Sun/ Ultraviolet light damage) and PreCancerous Actinic Keratoses by method of PhotoDynamic Therapy (PDT) and/or prescription Topical Chemotherapy agents such as 5-fluorouracil, 5-fluorouracil/calcipotriene, and/or imiquimod.  The purpose is to decrease the number of clinically evident and subclinical PreCancerous lesions to prevent progression to development of skin cancer by chemically destroying early precancer changes that may or may not be visible.  It has been shown to reduce the risk of developing skin cancer in the treated area. As a result of treatment, redness, scaling, crusting, and open sores may occur during treatment course. One or more than one of these methods may be used and may have to be used several times to control, suppress and eliminate the PreCancerous changes. Discussed treatment course, expected reaction, and possible side effects. - Recommend daily broad spectrum sunscreen SPF 30+ to sun-exposed areas, reapply every 2 hours as needed.  - Staying in the  shade or wearing long  sleeves, sun glasses (UVA+UVB protection) and wide brim hats (4-inch brim around the entire circumference of the hat) are also recommended. - Call for new or changing lesions.  - Start 5-fluorouracil cream once a day for 3 weeks to affected areas including forehead, temples.  Reviewed course of treatment and expected reaction.  Patient advised to expect inflammation and crusting and advised that erosions are possible.  Patient advised to be diligent with sun protection during and after treatment. Handout with details of how to apply medication and what to expect provided. Counseled to keep medication out of reach of children and pets.  INFLAMED SEBORRHEIC KERATOSIS Exam: Erythematous keratotic or waxy stuck-on papule or plaque. Symptomatic, irritating, patient would like treated. Benign-appearing.  Call clinic for new or changing lesions.   Prior to procedure, discussed risks of blister formation, small wound, skin dyspigmentation, or rare scar following treatment. Recommend Vaseline ointment to treated areas while healing.  Destruction Procedure Note Destruction method: cryotherapy   Informed consent: discussed and consent obtained   Lesion destroyed using liquid nitrogen: Yes   Outcome: patient tolerated procedure well with no complications   Post-procedure details: wound care instructions given   Locations: right temple x 1 # of Lesions Treated: 1  Return in about 4 months (around 11/15/2022) for AK follow up.  Anise Salvo, RMA, am acting as scribe for Armida Sans, MD .  Documentation: I have reviewed the above documentation for accuracy and completeness, and I agree with the above.  Armida Sans, MD

## 2022-07-20 ENCOUNTER — Encounter: Payer: Self-pay | Admitting: Family Medicine

## 2022-07-20 ENCOUNTER — Ambulatory Visit (INDEPENDENT_AMBULATORY_CARE_PROVIDER_SITE_OTHER): Payer: HMO | Admitting: Family Medicine

## 2022-07-20 ENCOUNTER — Telehealth: Payer: Self-pay

## 2022-07-20 VITALS — BP 122/78 | HR 78 | Temp 97.7°F | Ht 66.5 in | Wt 166.0 lb

## 2022-07-20 DIAGNOSIS — L989 Disorder of the skin and subcutaneous tissue, unspecified: Secondary | ICD-10-CM | POA: Insufficient documentation

## 2022-07-20 DIAGNOSIS — Z96651 Presence of right artificial knee joint: Secondary | ICD-10-CM

## 2022-07-20 DIAGNOSIS — J449 Chronic obstructive pulmonary disease, unspecified: Secondary | ICD-10-CM

## 2022-07-20 DIAGNOSIS — E782 Mixed hyperlipidemia: Secondary | ICD-10-CM

## 2022-07-20 DIAGNOSIS — I10 Essential (primary) hypertension: Secondary | ICD-10-CM

## 2022-07-20 DIAGNOSIS — I2510 Atherosclerotic heart disease of native coronary artery without angina pectoris: Secondary | ICD-10-CM | POA: Diagnosis not present

## 2022-07-20 LAB — LIPID PANEL
Cholesterol: 132 mg/dL (ref 0–200)
HDL: 44.2 mg/dL (ref >39.00)
LDL Cholesterol: 54 mg/dL (ref 0–99)
NonHDL: 87.8
Total CHOL/HDL Ratio: 3
Triglycerides: 169 mg/dL — ABNORMAL HIGH (ref 0.0–149.0)
VLDL: 33.8 mg/dL (ref 0.0–40.0)

## 2022-07-20 LAB — COMPREHENSIVE METABOLIC PANEL
ALT: 13 U/L (ref 0–53)
AST: 18 U/L (ref 0–37)
Albumin: 4.6 g/dL (ref 3.5–5.2)
Alkaline Phosphatase: 62 U/L (ref 39–117)
BUN: 22 mg/dL (ref 6–23)
CO2: 32 mEq/L (ref 19–32)
Calcium: 9.5 mg/dL (ref 8.4–10.5)
Chloride: 104 mEq/L (ref 96–112)
Creatinine, Ser: 1.1 mg/dL (ref 0.40–1.50)
GFR: 63.12 mL/min (ref >60.00)
Glucose, Bld: 83 mg/dL (ref 70–99)
Potassium: 4.4 mEq/L (ref 3.5–5.1)
Sodium: 141 mEq/L (ref 135–145)
Total Bilirubin: 0.8 mg/dL (ref 0.2–1.2)
Total Protein: 6.6 g/dL (ref 6.0–8.3)

## 2022-07-20 MED ORDER — ALBUTEROL SULFATE HFA 108 (90 BASE) MCG/ACT IN AERS: 1.0000 | INHALATION_SPRAY | RESPIRATORY_TRACT | 0 refills | Status: DC | PRN

## 2022-07-20 MED ORDER — ALBUTEROL SULFATE HFA 108 (90 BASE) MCG/ACT IN AERS: 1.0000 | INHALATION_SPRAY | RESPIRATORY_TRACT | 1 refills | Status: DC | PRN

## 2022-07-20 NOTE — Assessment & Plan Note (Signed)
I suspect these were actinic keratoses given that they use cryotherapy.  He will continue to follow with dermatology.

## 2022-07-20 NOTE — Assessment & Plan Note (Addendum)
Patient reports progressing well.  He will monitor.

## 2022-07-20 NOTE — Assessment & Plan Note (Signed)
Chronic issue.  Patient's blood pressure is well-controlled.  He will continue valsartan 80 mg daily.  Check labs.

## 2022-07-20 NOTE — Assessment & Plan Note (Signed)
Patient reports diagnosis by pulmonology in the past.  He notes he was diagnosed with early stage COPD.  I did discuss putting on on a controller medication given 2 exacerbations over the last year though he notes the cost was too expensive previously when these were prescribed and he would prefer to stay off of that at this time.  I will prescribe his albuterol to use 1-2 puffs every 6 hours as needed.  If he starts to use this on a weekly basis he will let us know and we can consider a controller medication.

## 2022-07-20 NOTE — Telephone Encounter (Signed)
I sent in a corrected prescription ?

## 2022-07-20 NOTE — Assessment & Plan Note (Signed)
Chronic issue.  Continue Crestor 20 mg daily.  Check lipid panel. 

## 2022-07-20 NOTE — Patient Instructions (Addendum)
Nice to see you. Please let me know if you start to use the albuterol more frequently. We will contact you with your lab results.

## 2022-07-20 NOTE — Assessment & Plan Note (Signed)
Continue risk factor management. 

## 2022-07-20 NOTE — Progress Notes (Signed)
Marikay Alar, MD Phone: [phone]  Cody Willis is a 82 y.o. male who presents today for follow-up.  HYPERTENSION Disease Monitoring Home BP Monitoring 130/60 yesterday Chest pain- no    Dyspnea- no Medications Compliance-  taking valsartan.  Edema- no BMET    Component Value Date/Time   NA 143 04/10/2022 1455   NA 142 02/02/2021 1117   NA 139 07/28/2012 1858   K 4.0 04/10/2022 1455   K 3.7 07/28/2012 1858   CL 104 04/10/2022 1455   CL 107 07/28/2012 1858   CO2 28 04/10/2022 1455   CO2 27 07/28/2012 1858   GLUCOSE 107 (H) 04/10/2022 1455   GLUCOSE 114 (H) 07/28/2012 1858   BUN 20 04/10/2022 1455   BUN 19 02/02/2021 1117   BUN 16 07/28/2012 1858   CREATININE 0.96 04/10/2022 1455   CREATININE 1.27 07/28/2012 1858   CALCIUM 9.3 04/10/2022 1455   CALCIUM 8.9 07/28/2012 1858   GFRNONAA >60 08/24/2020 1226   GFRNONAA 56 (L) 07/28/2012 1858   GFRAA >60 02/08/2015 1128   GFRAA >60 07/28/2012 1858   HYPERLIPIDEMIA Symptoms Chest pain on exertion:  no   Medications: Compliance- taking crestor Right upper quadrant pain- no  Muscle aches- no Lipid Panel     Component Value Date/Time   CHOL 114 06/26/2021 0924   TRIG 107.0 06/26/2021 0924   HDL 45.50 06/26/2021 0924   CHOLHDL 3 06/26/2021 0924   VLDL 21.4 06/26/2021 0924   LDLCALC 47 06/26/2021 0924   LDLDIRECT 76.0 10/20/2020 1515   COPD: Medication compliance- not on controller medication  Rescue inhaler use- only when sick with respiratory illness Dyspnea- no change to mild chronic dyspnea  Wheezing- only when sick  Cough- only when sick   Patient notes no significant COPD symptoms when he is not sick.  Prior to getting ill last March he had not used his albuterol in 4 to 5 months.  Patient reports partial knee replacement in the right knee.  He notes he is progressively improving.  Skin lesions: Patient reports he had cryotherapy on several skin lesions on his face recently.   Social History    Tobacco Use  Smoking Status Former   Types: Cigarettes  Smokeless Tobacco Never    Current Outpatient Medications on File Prior to Visit  Medication Sig Dispense Refill   acetaminophen (TYLENOL) 500 MG tablet Take 2 tablets (1,000 mg total) by mouth every 6 (six) hours as needed for mild pain or moderate pain. 60 tablet 0   aspirin EC 81 MG tablet Take 1 tablet (81 mg total) by mouth 2 (two) times daily. To prevent blood clots for 30 days after surgery. 60 tablet 0   benzonatate (TESSALON) 100 MG capsule Take 1 capsule (100 mg total) by mouth 3 (three) times daily as needed for cough. 21 capsule 0   cetirizine (ZYRTEC ALLERGY) 10 MG tablet Take 1 tablet (10 mg total) by mouth daily. 30 tablet 0   cyanocobalamin (VITAMIN B12) 1000 MCG/ML injection INJECT INTO THE MUSCLE EVERY 30 DAYS. PHARMACY-PLEASE PROVIDE APPROPRIATE NEEDLES AND SYRINGES 3 mL 3   fluorouracil (EFUDEX) 5 % cream Apply to forehead and temples once daily x 3 weeks. 40 g 0   ketoconazole (NIZORAL) 2 % shampoo apply three times per week, massage into scalp and leave in for 10 minutes before rinsing out (Patient taking differently: Apply 1 Application topically 2 (two) times a week.  massage into scalp and leave in for 10 minutes before rinsing out)  120 mL 6   ondansetron (ZOFRAN-ODT) 4 MG disintegrating tablet Take 1 tablet (4 mg total) by mouth every 8 (eight) hours as needed for nausea or vomiting. 15 tablet 0   Pancrelipase, Lip-Prot-Amyl, ([name]) 25000-79000 units CPEP Take 2 capsules by mouth with breakfast, with lunch, and with evening meal. 540 capsule 3   pantoprazole (PROTONIX) 40 MG tablet Take 1 tablet (40 mg total) by mouth every morning. 90 tablet 3   rosuvastatin (CRESTOR) 20 MG tablet Take 1 tablet (20 mg total) by mouth daily. Please call [phone] to schedule an overdue appointment with Dr. Donato Schultz for future refills. Thank you. 2nd attempt. 90 tablet 3   sennosides-docusate sodium (SENOKOT-S) 8.6-50  MG tablet Take 2 tablets by mouth daily as needed for constipation (while taking narcotics). 30 tablet 1   triamcinolone (KENALOG) 0.025 % ointment Apply 1 Application topically daily as needed for rash.     valsartan (DIOVAN) 80 MG tablet Take 1 tablet (80 mg total) by mouth daily. 90 tablet 3   No current facility-administered medications on file prior to visit.     ROS see history of present illness  Objective  Physical Exam Vitals:   07/20/22 0953  BP: 122/78  Pulse: 78  Temp: 97.7 F (36.5 C)  SpO2: 96%    BP Readings from Last 3 Encounters:  07/20/22 122/78  06/28/22 (!) 164/72  05/01/22 (!) 158/86   Wt Readings from Last 3 Encounters:  07/20/22 166 lb (75.3 kg)  05/01/22 160 lb 15 oz (73 kg)  04/19/22 163 lb (73.9 kg)    Physical Exam Constitutional:      General: He is not in acute distress.    Appearance: He is not diaphoretic.  HENT:     Head:     Comments: Several spots on his face consistent with cryotherapy Cardiovascular:     Rate and Rhythm: Normal rate and regular rhythm.     Heart sounds: Normal heart sounds.  Pulmonary:     Effort: Pulmonary effort is normal.     Breath sounds: Normal breath sounds.  Musculoskeletal:     Right lower leg: No edema.     Left lower leg: No edema.  Skin:    General: Skin is warm and dry.  Neurological:     Mental Status: He is alert.      Assessment/Plan: Please see individual problem list.  Primary hypertension Assessment & Plan: Chronic issue.  Patient's blood pressure is well-controlled.  He will continue valsartan 80 mg daily.  Check labs.  Orders: -     Comprehensive metabolic panel  Coronary artery disease involving native coronary artery of native heart without angina pectoris Assessment & Plan: Continue risk factor management.   Chronic obstructive pulmonary disease, unspecified COPD type Assessment & Plan: Patient reports diagnosis by pulmonology in the past.  He notes he was diagnosed  with early stage COPD.  I did discuss putting on on a controller medication given 2 exacerbations over the last year though he notes the cost was too expensive previously when these were prescribed and he would prefer to stay off of that at this time.  I will prescribe his albuterol to use 1-2 puffs every 6 hours as needed.  If he starts to use this on a weekly basis he will let us know and we can consider a controller medication.   Mixed hyperlipidemia Assessment & Plan: Chronic issue.  Continue Crestor 20 mg daily.  Check lipid panel.  Orders: -  Lipid panel  Skin lesions Assessment & Plan: I suspect these were actinic keratoses given that they use cryotherapy.  He will continue to follow with dermatology.   S/P right unicompartmental knee replacement Assessment & Plan: Patient reports progressing well.  He will monitor.  Orders: -     Albuterol Sulfate HFA; Inhale 1-2 puffs into the lungs every 4 (four) hours as needed for wheezing or shortness of breath.  Dispense: 1 each; Refill: 0    Return in about 6 months (around 01/19/2023).   Marikay Alar, MD [facility]

## 2022-07-20 NOTE — Telephone Encounter (Signed)
Patient states he saw Dr. Marikay Alar today and he was going to send a prescription to his pharmacy [facility]) for albuterol (VENTOLIN HFA) 108 (90 Base) MCG/ACT inhaler.  Patient states Dr. Birdie Sons told him he would send in a prescription for two inhalers, but patient states he is at the pharmacy now and the prescription is for one inhaler.  Patient states he would like to know if we can correct this for him.

## 2022-07-23 NOTE — Telephone Encounter (Signed)
Patient states he picked up one but the other one is not ready yet

## 2022-07-24 ENCOUNTER — Encounter: Payer: Self-pay | Admitting: Dermatology

## 2022-07-26 ENCOUNTER — Ambulatory Visit: Payer: PPO | Admitting: Dermatology

## 2022-08-14 ENCOUNTER — Encounter: Payer: Self-pay | Admitting: Dermatology

## 2022-08-14 ENCOUNTER — Ambulatory Visit (INDEPENDENT_AMBULATORY_CARE_PROVIDER_SITE_OTHER): Payer: HMO | Admitting: Dermatology

## 2022-08-14 VITALS — BP 126/64 | HR 70

## 2022-08-14 DIAGNOSIS — C44319 Basal cell carcinoma of skin of other parts of face: Secondary | ICD-10-CM

## 2022-08-14 DIAGNOSIS — Z85820 Personal history of malignant melanoma of skin: Secondary | ICD-10-CM | POA: Diagnosis not present

## 2022-08-14 DIAGNOSIS — W908XXA Exposure to other nonionizing radiation, initial encounter: Secondary | ICD-10-CM

## 2022-08-14 DIAGNOSIS — X32XXXA Exposure to sunlight, initial encounter: Secondary | ICD-10-CM

## 2022-08-14 DIAGNOSIS — L219 Seborrheic dermatitis, unspecified: Secondary | ICD-10-CM

## 2022-08-14 DIAGNOSIS — L821 Other seborrheic keratosis: Secondary | ICD-10-CM

## 2022-08-14 DIAGNOSIS — D489 Neoplasm of uncertain behavior, unspecified: Secondary | ICD-10-CM

## 2022-08-14 DIAGNOSIS — L578 Other skin changes due to chronic exposure to nonionizing radiation: Secondary | ICD-10-CM | POA: Diagnosis not present

## 2022-08-14 DIAGNOSIS — Z85828 Personal history of other malignant neoplasm of skin: Secondary | ICD-10-CM | POA: Diagnosis not present

## 2022-08-14 DIAGNOSIS — L300 Nummular dermatitis: Secondary | ICD-10-CM

## 2022-08-14 MED ORDER — KETOCONAZOLE 2 % EX SHAM: MEDICATED_SHAMPOO | CUTANEOUS | 6 refills | Status: AC

## 2022-08-14 NOTE — Progress Notes (Signed)
Follow-Up Visit   Subjective  Cody Willis is a 82 y.o. male who presents for the following: patient here today with concerns about a spot bleeding at right temple, spots at left arm, and itchy skin.   The patient has spots, moles and lesions to be evaluated, some may be new or changing and the patient may have concern these could be cancer.   The following portions of the chart were reviewed this encounter and updated as appropriate: medications, allergies, medical history  Review of Systems:  No other skin or systemic complaints except as noted in HPI or Assessment and Plan.  Objective  Well appearing patient in no apparent distress; mood and affect are within normal limits.   A focused examination was performed of the following areas: Face, left arm , back, b/l shoulders, neck , chest   Relevant exam findings are noted in the Assessment and Plan.  right upper temple 2.5 x 1.5 mm pink pearly patch mild scale          Assessment & Plan   Nummular Dermatitis/Seborrheic  Dermatitis   Chronic and persistent condition with duration or expected duration over one year. Condition is bothersome/symptomatic for patient. Currently flared.   Exam: Scattered erythema with pink papules at chest and shoulders, pt c/o of itching off an on in these areas also neck, ears, scalp, worse in evening/night.  Treatment Plan: D/C Zest soap. Recommend Dove for Sensitive Skin for soap, use moisturizer after shower Continue using Ketoconazole Shampoo 2% - use 2 - 3 times weekly, apply topically to scalp, ears, neck, leave on skin for at least 5 minutes before rinsing off.   Recommend mild soap and moisturizing cream 1-2 times daily.  Gentle skin care handout provided.   Recommend OTC Gold Bond Rapid Relief Anti-Itch cream (pramoxine + menthol), CeraVe Anti-itch cream or lotion (pramoxine), Sarna lotion (Original- menthol + camphor or Sensitive- pramoxine) or Eucerin 12 hour Itch Relief  lotion (menthol) up to 3 times per day to areas on body that are itchy.    SEBORRHEIC KERATOSIS Left posterior upper arm and left upper back - Stuck-on, waxy, tan-brown papules and/or plaques  - Benign-appearing - Discussed benign etiology and prognosis. - Observe - Call for any changes   Neoplasm of uncertain behavior right upper temple  Skin / nail biopsy Type of biopsy: tangential   Informed consent: discussed and consent obtained   Patient was prepped and draped in usual sterile fashion: Area prepped with alcohol. Anesthesia: the lesion was anesthetized in a standard fashion   Anesthetic:  1% lidocaine w/ epinephrine 1-100,000 buffered w/ 8.4% NaHCO3 Instrument used: flexible razor blade   Hemostasis achieved with: pressure, aluminum chloride and electrodesiccation   Outcome: patient tolerated procedure well   Post-procedure details: wound care instructions given   Post-procedure details comment:  Ointment and small bandage applied  Specimen 1 - Surgical pathology Differential Diagnosis: r/o bcc   Check Margins: No  R/o bcc   Discussed may need Mohs vs ED&C pending results with pathology   Patient prefers [facility] if Moh's treatment is needed.   Seborrheic dermatitis  Related Medications ketoconazole (NIZORAL) 2 % shampoo use 2 - 3 times weekly, apply topically to scalp , neck and chest, leave on skin for at least 5 minutes before rinsing off.  ACTINIC DAMAGE - chronic, secondary to cumulative UV radiation exposure/sun exposure over time - diffuse scaly erythematous macules with underlying dyspigmentation - Recommend daily broad spectrum sunscreen SPF 30+  to sun-exposed areas, reapply every 2 hours as needed.  - Recommend staying in the shade or wearing long sleeves, sun glasses (UVA+UVB protection) and wide brim hats (4-inch brim around the entire circumference of the hat). - Call for new or changing lesions.  HISTORY OF MELANOMA Right mid  abdomen 0.42 mm, [place] level II excised 11/22/2003  - No evidence of recurrence today - Recommend regular full body skin exams - Recommend daily broad spectrum sunscreen SPF 30+ to sun-exposed areas, reapply every 2 hours as needed.  - Call if any new or changing lesions are noted between office visits  HISTORY OF BASAL CELL CARCINOMA OF THE SKIN - No evidence of recurrence today - Recommend regular full body skin exams - Recommend daily broad spectrum sunscreen SPF 30+ to sun-exposed areas, reapply every 2 hours as needed.  - Call if any new or changing lesions are noted between office visits   Return for keep follow up as scheduled .  I, Asher Muir, CMA, am acting as scribe for Willeen Niece, MD.   Documentation: I have reviewed the above documentation for accuracy and completeness, and I agree with the above.  Willeen Niece, MD

## 2022-08-14 NOTE — Patient Instructions (Addendum)
Recommend Dove for Sensitive Skin for soap  Continue using Ketoconazole Shampoo 2% - use 2 - 3 times weekly, apply topically to scalp , neck and chest, leave on skin for at least 5 minutes before rinsing off.   Recommend OTC Gold Bond Rapid Relief Anti-Itch cream (pramoxine + menthol), CeraVe Anti-itch cream or lotion (pramoxine), Sarna lotion (Original- menthol + camphor or Sensitive- pramoxine) or Eucerin 12 hour Itch Relief lotion (menthol) up to 3 times per day to areas on body that are itchy.     Gentle Skin Care Guide  1. Bathe no more than once a day.  2. Avoid bathing in hot water  3. Use a mild soap like Dove, Vanicream, Cetaphil, CeraVe. Can use Lever 2000 or Cetaphil antibacterial soap  4. Use soap only where you need it. On most days, use it under your arms, between your legs, and on your feet. Let the water rinse other areas unless visibly dirty.  5. When you get out of the bath/shower, use a towel to gently blot your skin dry, don't rub it.  6. While your skin is still a little damp, apply a moisturizing cream such as Vanicream, CeraVe, Cetaphil, Eucerin, Sarna lotion or plain Vaseline Jelly. For hands apply Neutrogena [place] Hand Cream or Excipial Hand Cream.  7. Reapply moisturizer any time you start to itch or feel dry.  8. Sometimes using free and clear laundry detergents can be helpful. Fabric softener sheets should be avoided. Downy Free & Gentle liquid, or any liquid fabric softener that is free of dyes and perfumes, it acceptable to use  9. If your doctor has given you prescription creams you may apply moisturizers over them      Biopsy Wound Care Instructions  Leave the original bandage on for 24 hours if possible.  If the bandage becomes soaked or soiled before that time, it is OK to remove it and examine the wound.  A small amount of post-operative bleeding is normal.  If excessive bleeding occurs, remove the bandage, place gauze over the site and apply  continuous pressure (no peeking) over the area for 30 minutes. If this does not work, please call our clinic as soon as possible or page your doctor if it is after hours.   Once a day, cleanse the wound with soap and water. It is fine to shower. If a thick crust develops you may use a Q-tip dipped into dilute hydrogen peroxide (mix 1:1 with water) to dissolve it.  Hydrogen peroxide can slow the healing process, so use it only as needed.    After washing, apply petroleum jelly (Vaseline) or an antibiotic ointment if your doctor prescribed one for you, followed by a bandage.    For best healing, the wound should be covered with a layer of ointment at all times. If you are not able to keep the area covered with a bandage to hold the ointment in place, this may mean re-applying the ointment several times a day.  Continue this wound care until the wound has healed and is no longer open.   Itching and mild discomfort is normal during the healing process. However, if you develop pain or severe itching, please call our office.   If you have any discomfort, you can take Tylenol (acetaminophen) or ibuprofen as directed on the bottle. (Please do not take these if you have an allergy to them or cannot take them for another reason).  Some redness, tenderness and white or yellow material in  the wound is normal healing.  If the area becomes very sore and red, or develops a thick yellow-green material (pus), it may be infected; please notify us.    If you have stitches, return to clinic as directed to have the stitches removed. You will continue wound care for 2-3 days after the stitches are removed.   Wound healing continues for up to one year following surgery. It is not unusual to experience pain in the scar from time to time during the interval.  If the pain becomes severe or the scar thickens, you should notify the office.    A slight amount of redness in a scar is expected for the first six months.  After  six months, the redness will fade and the scar will soften and fade.  The color difference becomes less noticeable with time.  If there are any problems, return for a post-op surgery check at your earliest convenience.  To improve the appearance of the scar, you can use silicone scar gel, cream, or sheets (such as Mederma or Serica) every night for up to one year. These are available over the counter (without a prescription).  Please call our office at [phone] for any questions or concerns.      Due to recent changes in healthcare laws, you may see results of your pathology and/or laboratory studies on MyChart before the doctors have had a chance to review them. We understand that in some cases there may be results that are confusing or concerning to you. Please understand that not all results are received at the same time and often the doctors may need to interpret multiple results in order to provide you with the best plan of care or course of treatment. Therefore, we ask that you please give [place] 2 business days to thoroughly review all your results before contacting the office for clarification. Should we see a critical lab result, you will be contacted sooner.   If You Need Anything After Your Visit  If you have any questions or concerns for your doctor, please call our main line at [phone] and press option 4 to reach your doctor's medical assistant. If no one answers, please leave a voicemail as directed and we will return your call as soon as possible. Messages left after 4 pm will be answered the following business day.   You may also send [place] a message via MyChart. We typically respond to MyChart messages within 1-2 business days.  For prescription refills, please ask your pharmacy to contact our office. Our fax number is [phone].  If you have an urgent issue when the clinic is closed that cannot wait until the next business day, you can page your doctor at the number  below.    Please note that while we do our best to be available for urgent issues outside of office hours, we are not available 24/7.   If you have an urgent issue and are unable to reach [place], you may choose to seek medical care at your doctor's office, retail clinic, urgent care center, or emergency room.  If you have a medical emergency, please immediately call 911 or go to the emergency department.  Pager Numbers  - Dr. Gwen Pounds: [phone]  - Dr. Neale Burly: [phone]  - Dr. Roseanne Reno: [phone]  In the event of inclement weather, please call our main line at [phone] for an update on the status of any delays or closures.  Dermatology Medication Tips: Please keep the boxes that topical  medications come in in order to help keep track of the instructions about where and how to use these. Pharmacies typically print the medication instructions only on the boxes and not directly on the medication tubes.   If your medication is too expensive, please contact our office at [phone] option 4 or send [place] a message through MyChart.   We are unable to tell what your co-pay for medications will be in advance as this is different depending on your insurance coverage. However, we may be able to find a substitute medication at lower cost or fill out paperwork to get insurance to cover a needed medication.   If a prior authorization is required to get your medication covered by your insurance company, please allow [place] 1-2 business days to complete this process.  Drug prices often vary depending on where the prescription is filled and some pharmacies may offer cheaper prices.  The website www.goodrx.com contains coupons for medications through different pharmacies. The prices here do not account for what the cost may be with help from insurance (it may be cheaper with your insurance), but the website can give you the price if you did not use any insurance.  - You can print the associated coupon  and take it with your prescription to the pharmacy.  - You may also stop by our office during regular business hours and pick up a GoodRx coupon card.  - If you need your prescription sent electronically to a different pharmacy, notify our office through [facility] or by phone at [phone] option 4.     Si Usted Necesita Algo Despus de Su Visita  Tambin puede enviarnos un mensaje a travs de Clinical cytogeneticist. Por lo general respondemos a los mensajes de MyChart en el transcurso de 1 a 2 das hbiles.  Para renovar recetas, por favor pida a su farmacia que se ponga en contacto con nuestra oficina. Annie Sable de fax es [place] [phone].  Si tiene un asunto urgente cuando la clnica est cerrada y que no puede esperar hasta el siguiente da hbil, puede llamar/localizar a su doctor(a) al nmero que aparece a continuacin.   Por favor, tenga en cuenta que aunque hacemos todo lo posible para estar disponibles para asuntos urgentes fuera del horario de [place], no estamos disponibles las 24 horas del da, los 7 [address]  [address] de la [place].   Si tiene un problema urgente y no puede comunicarse con nosotros, puede optar por buscar atencin mdica  en el consultorio de su doctor(a), en una clnica privada, en un centro de atencin urgente o en una sala de emergencias.  Si tiene Engineer, drilling, por favor llame inmediatamente al 911 o vaya a la sala de emergencias.  Nmeros de bper  - Dr. Gwen Pounds: [phone]  - Dra. Moye: [phone]  - Dra. Roseanne Reno: [phone]  En caso de inclemencias del [place], por favor llame a Lacy Duverney principal al [phone] para una actualizacin sobre el [place] de cualquier retraso o cierre.  Consejos para la medicacin en dermatologa: Por favor, guarde las cajas en las que vienen los medicamentos de uso tpico para ayudarle a seguir las instrucciones sobre dnde y cmo usarlos. Las farmacias generalmente imprimen las instrucciones del medicamento  slo en las cajas y no directamente en los tubos del [place].   Si su medicamento es muy caro, por favor, pngase en contacto con Rolm Gala llamando al [phone] y presione la opcin 4 o envenos un mensaje a travs de Clinical cytogeneticist.   No podemos  decirle cul ser su copago por los medicamentos por adelantado ya que esto es diferente dependiendo de la cobertura de su seguro. Sin embargo, es posible que podamos encontrar un medicamento sustituto a Audiological scientist un formulario para que el seguro cubra el medicamento que se considera necesario.   Si se requiere una autorizacin previa para que su compaa de seguros [place] su medicamento, por favor permtanos de 1 a 2 das hbiles para completar [address]  Los precios de los medicamentos varan con frecuencia dependiendo del Environmental consultant de dnde se surte la receta y alguna farmacias pueden ofrecer precios ms baratos.  El sitio web www.goodrx.com tiene cupones para medicamentos de Health and safety inspector. Los precios aqu no tienen en cuenta lo que podra costar con la ayuda del seguro (puede ser ms barato con su seguro), pero el sitio web puede darle el precio si no utiliz Tourist information centre manager.  - Puede imprimir el cupn correspondiente y llevarlo con su receta a la farmacia.  - Tambin puede pasar por nuestra oficina durante el horario de atencin regular y Education officer, museum una tarjeta de cupones de GoodRx.  - Si necesita que su receta se enve electrnicamente a una farmacia diferente, informe a nuestra oficina a travs de MyChart de [place] o por telfono llamando al [phone] y presione la opcin 4.

## 2022-08-20 ENCOUNTER — Telehealth: Payer: Self-pay

## 2022-08-20 NOTE — Telephone Encounter (Signed)
Advised patient biopsy of the right upper temple was Superficial BCC. Discussed treatment options, EDC here vs Mohs surgery. Patient prefers to have treated in office here. Scheduled 09/12/22 at 1:30 PM.

## 2022-08-20 NOTE — Telephone Encounter (Signed)
-----   Message from Willeen Niece, MD sent at 08/20/2022 12:42 PM EDT ----- Skin , right upper temple SUPERFICIAL BASAL CELL CARCINOMA WITH FOCAL INFILTRATION  This is a thin BCC skin cancer.  If he doesn't mind having a large flat white scar the same size as the lesion, it could be treated with EDC here in the office since it does not go deep.  Alternative treatment would be Mohs surgery in [facility] if he prefers that, which most likely would be a line scar.   - please call patient

## 2022-09-12 ENCOUNTER — Ambulatory Visit (INDEPENDENT_AMBULATORY_CARE_PROVIDER_SITE_OTHER): Payer: HMO | Admitting: Dermatology

## 2022-09-12 VITALS — BP 126/62 | HR 63

## 2022-09-12 DIAGNOSIS — Z760 Encounter for issue of repeat prescription: Secondary | ICD-10-CM | POA: Diagnosis not present

## 2022-09-12 DIAGNOSIS — X32XXXA Exposure to sunlight, initial encounter: Secondary | ICD-10-CM | POA: Diagnosis not present

## 2022-09-12 DIAGNOSIS — Z85820 Personal history of malignant melanoma of skin: Secondary | ICD-10-CM | POA: Diagnosis not present

## 2022-09-12 DIAGNOSIS — Z85828 Personal history of other malignant neoplasm of skin: Secondary | ICD-10-CM

## 2022-09-12 DIAGNOSIS — W908XXA Exposure to other nonionizing radiation, initial encounter: Secondary | ICD-10-CM

## 2022-09-12 DIAGNOSIS — C44319 Basal cell carcinoma of skin of other parts of face: Secondary | ICD-10-CM | POA: Diagnosis not present

## 2022-09-12 DIAGNOSIS — L578 Other skin changes due to chronic exposure to nonionizing radiation: Secondary | ICD-10-CM

## 2022-09-12 DIAGNOSIS — W57XXXA Bitten or stung by nonvenomous insect and other nonvenomous arthropods, initial encounter: Secondary | ICD-10-CM

## 2022-09-12 MED ORDER — TRIAMCINOLONE ACETONIDE 0.025 % EX OINT: 1.0000 | TOPICAL_OINTMENT | Freq: Every day | CUTANEOUS | 0 refills | Status: AC | PRN

## 2022-09-12 MED ORDER — TRIAMCINOLONE ACETONIDE 0.025 % EX OINT: 1.0000 | TOPICAL_OINTMENT | Freq: Every day | CUTANEOUS | 1 refills | Status: DC | PRN

## 2022-09-12 NOTE — Progress Notes (Signed)
Follow-Up Visit   Subjective  Cody Willis is a 82 y.o. male who presents for the following: Here for treatment of bx proven superficial bcc at right upper temple. Patient also would like to get refill of tmc ointment that he uses for occasional bug bites.   The following portions of the chart were reviewed this encounter and updated as appropriate: medications, allergies, medical history  Review of Systems:  No other skin or systemic complaints except as noted in HPI or Assessment and Plan.  Objective  Well appearing patient in no apparent distress; mood and affect are within normal limits.   A focused examination was performed of the following areas: Right upper temple   Relevant exam findings are noted in the Assessment and Plan.  right upper temple Healing pink scar    Assessment & Plan   Insect Bite reaction Exam: clear   Treatment Plan: Patient request refills for bug bites. Does a lot of work outside.   Continue tmc 0.025 % ointment to aa bid as needed for bites   Avoid applying to face, groin, and axilla. Use as directed. Long-term use can cause thinning of the skin.  Topical steroids (such as triamcinolone, fluocinolone, fluocinonide, mometasone, clobetasol, halobetasol, betamethasone, hydrocortisone) can cause thinning and lightening of the skin if they are used for too long in the same area. Your physician has selected the right strength medicine for your problem and area affected on the body. Please use your medication only as directed by your physician to prevent side effects.   ACTINIC DAMAGE - chronic, secondary to cumulative UV radiation exposure/sun exposure over time - diffuse scaly erythematous macules with underlying dyspigmentation - Recommend daily broad spectrum sunscreen SPF 30+ to sun-exposed areas, reapply every 2 hours as needed.  - Recommend staying in the shade or wearing long sleeves, sun glasses (UVA+UVB protection) and wide brim hats  (4-inch brim around the entire circumference of the hat). - Call for new or changing lesions.   Basal cell carcinoma (BCC) of skin of other part of face right upper temple  Destruction of lesion  Destruction method: electrodesiccation and curettage   Informed consent: discussed and consent obtained   Timeout:  patient name, date of birth, surgical site, and procedure verified Procedure prep:  Patient was prepped and draped in usual sterile fashion Prep type:  Isopropyl alcohol Anesthesia: the lesion was anesthetized in a standard fashion   Anesthetic:  1% lidocaine w/ epinephrine 1-100,000 buffered w/ 8.4% NaHCO3 Curettage performed in three different directions: Yes   Electrodesiccation performed over the curetted area: Yes   Final wound size (cm):  3 Hemostasis achieved with:  pressure, aluminum chloride and electrodesiccation Outcome: patient tolerated procedure well with no complications   Post-procedure details: sterile dressing applied and wound care instructions given   Dressing type: bandage   Additional details:  Mupirocin ointment and Bandaid applied   Bx proven bcc Accession: [id number]   Reported: 08/17/2022     Bug bite, initial encounter  Related Medications triamcinolone (KENALOG) 0.025 % ointment Apply 1 Application topically daily as needed.  Actinic skin damage  History of basal cell carcinoma (BCC) of skin  History of melanoma  HISTORY OF BASAL CELL CARCINOMA OF THE SKIN - No evidence of recurrence today - Recommend regular full body skin exams - Recommend daily broad spectrum sunscreen SPF 30+ to sun-exposed areas, reapply every 2 hours as needed.  - Call if any new or changing lesions are noted between office visits  HISTORY OF MELANOMA 2005 - No evidence of recurrence today abdomen - Recommend regular full body skin exams - Recommend daily broad spectrum sunscreen SPF 30+ to sun-exposed areas, reapply every 2 hours as needed.  - Call if any  new or changing lesions are noted between office visits   Return in about 2 months (around 11/12/2022) for UBSE.  I, Asher Muir, CMA, am acting as scribe for Willeen Niece, MD.   Documentation: I have reviewed the above documentation for accuracy and completeness, and I agree with the above.  Willeen Niece, MD

## 2022-09-12 NOTE — Patient Instructions (Addendum)
Electrodesiccation and Curettage ("Scrape and Burn") Wound Care Instructions  Leave the original bandage on for 24 hours if possible.  If the bandage becomes soaked or soiled before that time, it is OK to remove it and examine the wound.  A small amount of post-operative bleeding is normal.  If excessive bleeding occurs, remove the bandage, place gauze over the site and apply continuous pressure (no peeking) over the area for 30 minutes. If this does not work, please call our clinic as soon as possible or page your doctor if it is after hours.   Once a day, cleanse the wound with soap and water. It is fine to shower. If a thick crust develops you may use a Q-tip dipped into dilute hydrogen peroxide (mix 1:1 with water) to dissolve it.  Hydrogen peroxide can slow the healing process, so use it only as needed.    After washing, apply petroleum jelly (Vaseline) or an antibiotic ointment if your doctor prescribed one for you, followed by a bandage.    For best healing, the wound should be covered with a layer of ointment at all times. If you are not able to keep the area covered with a bandage to hold the ointment in place, this may mean re-applying the ointment several times a day.  Continue this wound care until the wound has healed and is no longer open. It may take several weeks for the wound to heal and close.  Itching and mild discomfort is normal during the healing process.  If you have any discomfort, you can take Tylenol (acetaminophen) or ibuprofen as directed on the bottle. (Please do not take these if you have an allergy to them or cannot take them for another reason).  Some redness, tenderness and white or yellow material in the wound is normal healing.  If the area becomes very sore and red, or develops a thick yellow-green material (pus), it may be infected; please notify us.    Wound healing continues for up to one year following surgery. It is not unusual to experience pain in the scar  from time to time during the interval.  If the pain becomes severe or the scar thickens, you should notify the office.    A slight amount of redness in a scar is expected for the first six months.  After six months, the redness will fade and the scar will soften and fade.  The color difference becomes less noticeable with time.  If there are any problems, return for a post-op surgery check at your earliest convenience.  To improve the appearance of the scar, you can use silicone scar gel, cream, or sheets (such as Mederma or Serica) every night for up to one year. These are available over the counter (without a prescription).  Please call our office at [phone] for any questions or concerns.     Due to recent changes in healthcare laws, you may see results of your pathology and/or laboratory studies on MyChart before the doctors have had a chance to review them. We understand that in some cases there may be results that are confusing or concerning to you. Please understand that not all results are received at the same time and often the doctors may need to interpret multiple results in order to provide you with the best plan of care or course of treatment. Therefore, we ask that you please give us 2 business days to thoroughly review all your results before contacting the office for clarification. Should   we see a critical lab result, you will be contacted sooner.   If You Need Anything After Your Visit  If you have any questions or concerns for your doctor, please call our main line at [phone] and press option 4 to reach your doctor's medical assistant. If no one answers, please leave a voicemail as directed and we will return your call as soon as possible. Messages left after 4 pm will be answered the following business day.   You may also send us a message via MyChart. We typically respond to MyChart messages within 1-2 business days.  For prescription refills, please ask your  pharmacy to contact our office. Our fax number is [phone].  If you have an urgent issue when the clinic is closed that cannot wait until the next business day, you can page your doctor at the number below.    Please note that while we do our best to be available for urgent issues outside of office hours, we are not available 24/7.   If you have an urgent issue and are unable to reach us, you may choose to seek medical care at your doctor's office, retail clinic, urgent care center, or emergency room.  If you have a medical emergency, please immediately call 911 or go to the emergency department.  Pager Numbers  - Dr. Kowalski: [phone]  - Dr. Moye: [phone]  - Dr. Stewart: [phone]  In the event of inclement weather, please call our main line at [phone] for an update on the status of any delays or closures.  Dermatology Medication Tips: Please keep the boxes that topical medications come in in order to help keep track of the instructions about where and how to use these. Pharmacies typically print the medication instructions only on the boxes and not directly on the medication tubes.   If your medication is too expensive, please contact our office at [phone] option 4 or send us a message through MyChart.   We are unable to tell what your co-pay for medications will be in advance as this is different depending on your insurance coverage. However, we may be able to find a substitute medication at lower cost or fill out paperwork to get insurance to cover a needed medication.   If a prior authorization is required to get your medication covered by your insurance company, please allow us 1-2 business days to complete this process.  Drug prices often vary depending on where the prescription is filled and some pharmacies may offer cheaper prices.  The website www.goodrx.com contains coupons for medications through different pharmacies. The prices here do not  account for what the cost may be with help from insurance (it may be cheaper with your insurance), but the website can give you the price if you did not use any insurance.  - You can print the associated coupon and take it with your prescription to the pharmacy.  - You may also stop by our office during regular business hours and pick up a GoodRx coupon card.  - If you need your prescription sent electronically to a different pharmacy, notify our office through [facility] MyChart or by phone at [phone] option 4.     Si Usted Necesita Algo Despus de Su Visita  Tambin puede enviarnos un mensaje a travs de MyChart. Por lo general respondemos a los mensajes de MyChart en el transcurso de 1 a 2 das hbiles.  Para renovar recetas, por favor pida a su farmacia que se ponga en   contacto con nuestra oficina. Nuestro nmero de fax es el [phone].  Si tiene un asunto urgente cuando la clnica est cerrada y que no puede esperar hasta el siguiente da hbil, puede llamar/localizar a su doctor(a) al nmero que aparece a continuacin.   Por favor, tenga en cuenta que aunque hacemos todo lo posible para estar disponibles para asuntos urgentes fuera del horario de oficina, no estamos disponibles las 24 horas del da, los 7 das de la semana.   Si tiene un problema urgente y no puede comunicarse con nosotros, puede optar por buscar atencin mdica  en el consultorio de su doctor(a), en una clnica privada, en un centro de atencin urgente o en una sala de emergencias.  Si tiene una emergencia mdica, por favor llame inmediatamente al 911 o vaya a la sala de emergencias.  Nmeros de bper  - Dr. Kowalski: [phone]  - Dra. Moye: [phone]  - Dra. Stewart: [phone]  En caso de inclemencias del tiempo, por favor llame a nuestra lnea principal al [phone] para una actualizacin sobre el estado de cualquier retraso o cierre.  Consejos para la medicacin en dermatologa: Por  favor, guarde las cajas en las que vienen los medicamentos de uso tpico para ayudarle a seguir las instrucciones sobre dnde y cmo usarlos. Las farmacias generalmente imprimen las instrucciones del medicamento slo en las cajas y no directamente en los tubos del medicamento.   Si su medicamento es muy caro, por favor, pngase en contacto con nuestra oficina llamando al [phone] y presione la opcin 4 o envenos un mensaje a travs de MyChart.   No podemos decirle cul ser su copago por los medicamentos por adelantado ya que esto es diferente dependiendo de la cobertura de su seguro. Sin embargo, es posible que podamos encontrar un medicamento sustituto a menor costo o llenar un formulario para que el seguro cubra el medicamento que se considera necesario.   Si se requiere una autorizacin previa para que su compaa de seguros cubra su medicamento, por favor permtanos de 1 a 2 das hbiles para completar este proceso.  Los precios de los medicamentos varan con frecuencia dependiendo del lugar de dnde se surte la receta y alguna farmacias pueden ofrecer precios ms baratos.  El sitio web www.goodrx.com tiene cupones para medicamentos de diferentes farmacias. Los precios aqu no tienen en cuenta lo que podra costar con la ayuda del seguro (puede ser ms barato con su seguro), pero el sitio web puede darle el precio si no utiliz ningn seguro.  - Puede imprimir el cupn correspondiente y llevarlo con su receta a la farmacia.  - Tambin puede pasar por nuestra oficina durante el horario de atencin regular y recoger una tarjeta de cupones de GoodRx.  - Si necesita que su receta se enve electrnicamente a una farmacia diferente, informe a nuestra oficina a travs de MyChart de  o por telfono llamando al [phone] y presione la opcin 4.  

## 2022-09-18 DIAGNOSIS — H353232 Exudative age-related macular degeneration, bilateral, with inactive choroidal neovascularization: Secondary | ICD-10-CM | POA: Diagnosis not present

## 2022-11-14 ENCOUNTER — Encounter: Payer: Self-pay | Admitting: Dermatology

## 2022-11-14 ENCOUNTER — Ambulatory Visit (INDEPENDENT_AMBULATORY_CARE_PROVIDER_SITE_OTHER): Payer: HMO | Admitting: Dermatology

## 2022-11-14 VITALS — BP 110/64 | HR 65

## 2022-11-14 DIAGNOSIS — Z85820 Personal history of malignant melanoma of skin: Secondary | ICD-10-CM | POA: Diagnosis not present

## 2022-11-14 DIAGNOSIS — D485 Neoplasm of uncertain behavior of skin: Secondary | ICD-10-CM

## 2022-11-14 DIAGNOSIS — Z1283 Encounter for screening for malignant neoplasm of skin: Secondary | ICD-10-CM

## 2022-11-14 DIAGNOSIS — L578 Other skin changes due to chronic exposure to nonionizing radiation: Secondary | ICD-10-CM

## 2022-11-14 DIAGNOSIS — Z85828 Personal history of other malignant neoplasm of skin: Secondary | ICD-10-CM | POA: Diagnosis not present

## 2022-11-14 DIAGNOSIS — D1801 Hemangioma of skin and subcutaneous tissue: Secondary | ICD-10-CM | POA: Diagnosis not present

## 2022-11-14 DIAGNOSIS — L814 Other melanin hyperpigmentation: Secondary | ICD-10-CM

## 2022-11-14 DIAGNOSIS — D229 Melanocytic nevi, unspecified: Secondary | ICD-10-CM

## 2022-11-14 DIAGNOSIS — R238 Other skin changes: Secondary | ICD-10-CM

## 2022-11-14 DIAGNOSIS — W908XXA Exposure to other nonionizing radiation, initial encounter: Secondary | ICD-10-CM | POA: Diagnosis not present

## 2022-11-14 DIAGNOSIS — L820 Inflamed seborrheic keratosis: Secondary | ICD-10-CM

## 2022-11-14 DIAGNOSIS — D692 Other nonthrombocytopenic purpura: Secondary | ICD-10-CM | POA: Diagnosis not present

## 2022-11-14 DIAGNOSIS — L821 Other seborrheic keratosis: Secondary | ICD-10-CM | POA: Diagnosis not present

## 2022-11-14 DIAGNOSIS — L570 Actinic keratosis: Secondary | ICD-10-CM

## 2022-11-14 DIAGNOSIS — C44319 Basal cell carcinoma of skin of other parts of face: Secondary | ICD-10-CM

## 2022-11-14 DIAGNOSIS — Z7189 Other specified counseling: Secondary | ICD-10-CM

## 2022-11-14 NOTE — Progress Notes (Signed)
Follow-Up Visit   Subjective  Cody Willis is a 82 y.o. male who presents for the following: Skin Cancer Screening and Upper Body Skin Exam  The patient presents for Upper Body Skin Exam (UBSE) for skin cancer screening and mole check. The patient has spots, moles and lesions to be evaluated, some may be new or changing. He has an itchy spot  in his scalp that came up 2-3 months ago, and a spot on his upper forehead and jaw. History of BCCs, Melanoma (2005).   The following portions of the chart were reviewed this encounter and updated as appropriate: medications, allergies, medical history  Review of Systems:  No other skin or systemic complaints except as noted in HPI or Assessment and Plan.  Objective  Well appearing patient in no apparent distress; mood and affect are within normal limits.  All skin waist up examined. Relevant physical exam findings are noted in the Assessment and Plan.    Assessment & Plan  Skin cancer screening performed today.  ACTINIC DAMAGE WITH PRECANCEROUS ACTINIC KERATOSES Counseling for Topical Chemotherapy Management: Patient exhibits: - Severe, confluent actinic changes with pre-cancerous actinic keratoses that is secondary to cumulative UV radiation exposure over time - Condition that is severe; chronic, not at goal. - diffuse scaly erythematous macules and papules with underlying dyspigmentation - Discussed Prescription "Field Treatment" topical Chemotherapy for Severe, Chronic Confluent Actinic Changes with Pre-Cancerous Actinic Keratoses Field treatment involves treatment of an entire area of skin that has confluent Actinic Changes (Sun/ Ultraviolet light damage) and PreCancerous Actinic Keratoses by method of PhotoDynamic Therapy (PDT) and/or prescription Topical Chemotherapy agents such as 5-fluorouracil, 5-fluorouracil/calcipotriene, and/or imiquimod.  The purpose is to decrease the number of clinically evident and subclinical PreCancerous  lesions to prevent progression to development of skin cancer by chemically destroying early precancer changes that may or may not be visible.  It has been shown to reduce the risk of developing skin cancer in the treated area. As a result of treatment, redness, scaling, crusting, and open sores may occur during treatment course. One or more than one of these methods may be used and may have to be used several times to control, suppress and eliminate the PreCancerous changes. Discussed treatment course, expected reaction, and possible side effects. - Recommend daily broad spectrum sunscreen SPF 30+ to sun-exposed areas, reapply every 2 hours as needed.  - Staying in the shade or wearing long sleeves, sun glasses (UVA+UVB protection) and wide brim hats (4-inch brim around the entire circumference of the hat) are also recommended. - Call for new or changing lesions. - Recommend red light PDT with debridement to face.  Patient will schedule.   ANGIOKERATOMA VS HEME crusted excoriation Exam: 1.0 mm black macule with blood under dermoscopy R leg  Discussed benign nature. Recommend observation. Call for changes.  Lentigines, Seborrheic Keratoses, Hemangiomas - Benign normal skin lesions - Benign-appearing - Call for any changes  Melanocytic Nevi - Tan-brown and/or pink-flesh-colored symmetric macules and papules - Benign appearing on exam today - Observation - Call clinic for new or changing moles - Recommend daily use of broad spectrum spf 30+ sunscreen to sun-exposed areas.   HISTORY OF MELANOMA 2005 - No evidence of recurrence today abdomen - Recommend regular full body skin exams - Recommend daily broad spectrum sunscreen SPF 30+ to sun-exposed areas, reapply every 2 hours as needed.  - Call if any new or changing lesions are noted between office visits   HISTORY OF BASAL CELL CARCINOMA  OF THE SKIN - No evidence of recurrence today - Recommend regular full body skin exams - Recommend  daily broad spectrum sunscreen SPF 30+ to sun-exposed areas, reapply every 2 hours as needed.  - Call if any new or changing lesions are noted between office visits  Purpura - Chronic; persistent and recurrent.  Treatable, but not curable. - Violaceous macules and patches - Benign - Related to trauma, age, sun damage and/or use of blood thinners, chronic use of topical and/or oral steroids - Observe - Can use OTC arnica containing moisturizer such as Dermend Bruise Formula if desired - Call for worsening or other concerns  Inflamed seborrheic keratosis (5) L mid back x 1, R antihelix x 1, L frontal hairline x 1, L parietal scalp x 1, R jaw x 1  Symptomatic, irritating, patient would like treated.  Destruction of lesion - L mid back x 1, R antihelix x 1, L frontal hairline x 1, L parietal scalp x 1, R jaw x 1 (5)  Destruction method: cryotherapy   Informed consent: discussed and consent obtained   Lesion destroyed using liquid nitrogen: Yes   Region frozen until ice ball extended beyond lesion: Yes   Outcome: patient tolerated procedure well with no complications   Post-procedure details: wound care instructions given   Additional details:  Prior to procedure, discussed risks of blister formation, small wound, skin dyspigmentation, or rare scar following cryotherapy. Recommend Vaseline ointment to treated areas while healing.   AK (actinic keratosis) (11) L temple x 2, L cheek x 5, R temple x 1, R cheek x 3  Actinic keratoses are precancerous spots that appear secondary to cumulative UV radiation exposure/sun exposure over time. They are chronic with expected duration over 1 year. A portion of actinic keratoses will progress to squamous cell carcinoma of the skin. It is not possible to reliably predict which spots will progress to skin cancer and so treatment is recommended to prevent development of skin cancer.  Recommend daily broad spectrum sunscreen SPF 30+ to sun-exposed areas,  reapply every 2 hours as needed.  Recommend staying in the shade or wearing long sleeves, sun glasses (UVA+UVB protection) and wide brim hats (4-inch brim around the entire circumference of the hat). Call for new or changing lesions.  Destruction of lesion - L temple x 2, L cheek x 5, R temple x 1, R cheek x 3 (11)  Destruction method: cryotherapy   Informed consent: discussed and consent obtained   Lesion destroyed using liquid nitrogen: Yes   Region frozen until ice ball extended beyond lesion: Yes   Outcome: patient tolerated procedure well with no complications   Post-procedure details: wound care instructions given   Additional details:  Prior to procedure, discussed risks of blister formation, small wound, skin dyspigmentation, or rare scar following cryotherapy. Recommend Vaseline ointment to treated areas while healing.   Neoplasm of uncertain behavior of skin L upper forehead  Epidermal / dermal shaving  Lesion diameter (cm):  0.7 Informed consent: discussed and consent obtained   Patient was prepped and draped in usual sterile fashion: Area prepped with alcohol. Anesthesia: the lesion was anesthetized in a standard fashion   Anesthetic:  1% lidocaine w/ epinephrine 1-100,000 buffered w/ 8.4% NaHCO3 Instrument used: flexible razor blade   Hemostasis achieved with: pressure, aluminum chloride and electrodesiccation   Outcome: patient tolerated procedure well    Destruction of lesion  Destruction method: electrodesiccation and curettage   Informed consent: discussed and consent obtained  Curettage performed in three different directions: Yes   Electrodesiccation performed over the curetted area: Yes   Final wound size (cm):  0.7 Hemostasis achieved with:  pressure, aluminum chloride and electrodesiccation Outcome: patient tolerated procedure well with no complications   Post-procedure details: wound care instructions given   Post-procedure details comment:  Ointment and  bandage applied.  Specimen 1 - Surgical pathology Differential Diagnosis: r/o BCC Check Margins: No EDC today  Counseling and coordination of care  Actinic skin damage  Skin cancer screening  Lentigo  Seborrheic keratosis  Nevus  History of melanoma  History of basal cell carcinoma (BCC) of skin  Senile purpura (HCC)  Basal cell carcinoma (BCC) of skin of other part of face    Return for Red light with debridement to face this fall; 6 mos Dr S AKs.  ICherlyn Labella, CMA, am acting as scribe for Willeen Niece, MD .   Documentation: I have reviewed the above documentation for accuracy and completeness, and I agree with the above.  Willeen Niece, MD

## 2022-11-14 NOTE — Patient Instructions (Addendum)
Cryotherapy Aftercare  Wash gently with soap and water everyday.   Apply Vaseline and Band-Aid daily until healed.   Wound Care Instructions  Cleanse wound gently with soap and water once a day then pat dry with clean gauze. Apply a thin coat of Petrolatum (petroleum jelly, "Vaseline") over the wound (unless you have an allergy to this). We recommend that you use a new, sterile tube of Vaseline. Do not pick or remove scabs. Do not remove the yellow or white "healing tissue" from the base of the wound.  Cover the wound with fresh, clean, nonstick gauze and secure with paper tape. You may use Band-Aids in place of gauze and tape if the wound is small enough, but would recommend trimming much of the tape off as there is often too much. Sometimes Band-Aids can irritate the skin.  You should call the office for your biopsy report after 1 week if you have not already been contacted.  If you experience any problems, such as abnormal amounts of bleeding, swelling, significant bruising, significant pain, or evidence of infection, please call the office immediately.  FOR ADULT SURGERY PATIENTS: If you need something for pain relief you may take 1 extra strength Tylenol (acetaminophen) AND 2 Ibuprofen (200mg  each) together every 4 hours as needed for pain. (do not take these if you are allergic to them or if you have a reason you should not take them.) Typically, you may only need pain medication for 1 to 3 days.    Due to recent changes in healthcare laws, you may see results of your pathology and/or laboratory studies on MyChart before the doctors have had a chance to review them. We understand that in some cases there may be results that are confusing or concerning to you. Please understand that not all results are received at the same time and often the doctors may need to interpret multiple results in order to provide you with the best plan of care or course of treatment. Therefore, we ask that you  please give [place] 2 business days to thoroughly review all your results before contacting the office for clarification. Should we see a critical lab result, you will be contacted sooner.   If You Need Anything After Your Visit  If you have any questions or concerns for your doctor, please call our main line at [phone] and press option 4 to reach your doctor's medical assistant. If no one answers, please leave a voicemail as directed and we will return your call as soon as possible. Messages left after 4 pm will be answered the following business day.   You may also send [place] a message via MyChart. We typically respond to MyChart messages within 1-2 business days.  For prescription refills, please ask your pharmacy to contact our office. Our fax number is [phone].  If you have an urgent issue when the clinic is closed that cannot wait until the next business day, you can page your doctor at the number below.    Please note that while we do our best to be available for urgent issues outside of office hours, we are not available 24/7.   If you have an urgent issue and are unable to reach [place], you may choose to seek medical care at your doctor's office, retail clinic, urgent care center, or emergency room.  If you have a medical emergency, please immediately call 911 or go to the emergency department.  Pager Numbers  - Dr. Gwen Pounds: [phone]  - Dr.  Roseanne Reno: [phone]  In the event of inclement weather, please call our main line at [phone] for an update on the status of any delays or closures.  Dermatology Medication Tips: Please keep the boxes that topical medications come in in order to help keep track of the instructions about where and how to use these. Pharmacies typically print the medication instructions only on the boxes and not directly on the medication tubes.   If your medication is too expensive, please contact our office at [phone] option 4 or send [place] a  message through MyChart.   We are unable to tell what your co-pay for medications will be in advance as this is different depending on your insurance coverage. However, we may be able to find a substitute medication at lower cost or fill out paperwork to get insurance to cover a needed medication.   If a prior authorization is required to get your medication covered by your insurance company, please allow [place] 1-2 business days to complete this process.  Drug prices often vary depending on where the prescription is filled and some pharmacies may offer cheaper prices.  The website www.goodrx.com contains coupons for medications through different pharmacies. The prices here do not account for what the cost may be with help from insurance (it may be cheaper with your insurance), but the website can give you the price if you did not use any insurance.  - You can print the associated coupon and take it with your prescription to the pharmacy.  - You may also stop by our office during regular business hours and pick up a GoodRx coupon card.  - If you need your prescription sent electronically to a different pharmacy, notify our office through [facility] or by phone at [phone] option 4.     Si Usted Necesita Algo Despus de Su Visita  Tambin puede enviarnos un mensaje a travs de Clinical cytogeneticist. Por lo general respondemos a los mensajes de MyChart en el transcurso de 1 a 2 das hbiles.  Para renovar recetas, por favor pida a su farmacia que se ponga en contacto con nuestra oficina. Annie Sable de fax es [place] [phone].  Si tiene un asunto urgente cuando la clnica est cerrada y que no puede esperar hasta el siguiente da hbil, puede llamar/localizar a su doctor(a) al nmero que aparece a continuacin.   Por favor, tenga en cuenta que aunque hacemos todo lo posible para estar disponibles para asuntos urgentes fuera del horario de [place], no estamos disponibles las 24 horas del da, los 7  [address]  [address] de la [place].   Si tiene un problema urgente y no puede comunicarse con nosotros, puede optar por buscar atencin mdica  en el consultorio de su doctor(a), en una clnica privada, en un centro de atencin urgente o en una sala de emergencias.  Si tiene Engineer, drilling, por favor llame inmediatamente al 911 o vaya a la sala de emergencias.  Nmeros de bper  - Dr. Gwen Pounds: [phone]  - Dra. Roseanne Reno: [phone]  En caso de inclemencias del [place], por favor llame a Lacy Duverney principal al [phone] para una actualizacin sobre el [place] de cualquier retraso o cierre.  Consejos para la medicacin en dermatologa: Por favor, guarde las cajas en las que vienen los medicamentos de uso tpico para ayudarle a seguir las instrucciones sobre dnde y cmo usarlos. Las farmacias generalmente imprimen las instrucciones del medicamento slo en las cajas y no directamente en los tubos del [place].   Si su  medicamento es [place] caro, por favor, pngase en contacto con Rolm Gala llamando al [phone] y presione la opcin 4 o envenos un mensaje a travs de Clinical cytogeneticist.   No podemos decirle cul ser su copago por los medicamentos por adelantado ya que esto es diferente dependiendo de la cobertura de su seguro. Sin embargo, es posible que podamos encontrar un medicamento sustituto a Audiological scientist un formulario para que el seguro cubra el medicamento que se considera necesario.   Si se requiere una autorizacin previa para que su compaa de seguros [place] su medicamento, por favor permtanos de 1 a 2 das hbiles para completar [address]  Los precios de los medicamentos varan con frecuencia dependiendo del Environmental consultant de dnde se surte la receta y alguna farmacias pueden ofrecer precios ms baratos.  El sitio web www.goodrx.com tiene cupones para medicamentos de Health and safety inspector. Los precios aqu no tienen en cuenta lo que podra costar con la ayuda del seguro (puede ser  ms barato con su seguro), pero el sitio web puede darle el precio si no utiliz Tourist information centre manager.  - Puede imprimir el cupn correspondiente y llevarlo con su receta a la farmacia.  - Tambin puede pasar por nuestra oficina durante el horario de atencin regular y Education officer, museum una tarjeta de cupones de GoodRx.  - Si necesita que su receta se enve electrnicamente a una farmacia diferente, informe a nuestra oficina a travs de MyChart de [place] o por telfono llamando al [phone] y presione la opcin 4.

## 2022-11-20 ENCOUNTER — Telehealth: Payer: Self-pay

## 2022-11-20 NOTE — Telephone Encounter (Signed)
-----   Message from Cody Willis sent at 11/20/2022 12:20 PM EDT ----- Diagnosis Skin , left upper forehead BASAL CELL CARCINOMA, NODULAR AND INFILTRATIVE PATTERNS  Cancer = BCC Already treated Recheck next visit

## 2022-11-20 NOTE — Telephone Encounter (Signed)
LM on VM please return my call  

## 2022-11-20 NOTE — Telephone Encounter (Signed)
-----   Message from Armida Sans sent at 11/20/2022 12:20 PM EDT ----- Diagnosis Skin , left upper forehead BASAL CELL CARCINOMA, NODULAR AND INFILTRATIVE PATTERNS  Cancer = BCC Already treated Recheck next visit

## 2022-11-20 NOTE — Telephone Encounter (Signed)
Discussed biopsy results with pt  °

## 2022-11-27 ENCOUNTER — Ambulatory Visit (HOSPITAL_COMMUNITY)
Admission: RE | Admit: 2022-11-27 | Discharge: 2022-11-27 | Disposition: A | Discharge status: Home / Self Care | Payer: HMO | Source: Ambulatory Visit | Attending: Nurse Practitioner | Admitting: Nurse Practitioner

## 2022-11-27 ENCOUNTER — Encounter (HOSPITAL_COMMUNITY): Payer: Self-pay

## 2022-11-27 VITALS — BP 137/64 | HR 58 | Temp 97.7°F | Resp 18

## 2022-11-27 DIAGNOSIS — J441 Chronic obstructive pulmonary disease with (acute) exacerbation: Secondary | ICD-10-CM

## 2022-11-27 DIAGNOSIS — Z96651 Presence of right artificial knee joint: Secondary | ICD-10-CM

## 2022-11-27 MED ORDER — AZITHROMYCIN 250 MG PO TABS: ORAL_TABLET | ORAL | 0 refills | Status: DC

## 2022-11-27 MED ORDER — ALBUTEROL SULFATE HFA 108 (90 BASE) MCG/ACT IN AERS: 1.0000 | INHALATION_SPRAY | RESPIRATORY_TRACT | 1 refills | Status: DC | PRN

## 2022-11-27 NOTE — Discharge Instructions (Signed)
As we discussed, I suspect you have an exacerbation of your chronic breathing condition likely due to a viral illness or bacterial cause.  Take the azithromycin as prescribed to treat it.  Continue butyryl inhaler as needed for wheezing/shortness of breath.  Continue cough Perles and Mucinex.  Make sure you are drinking plenty fluids.  Seek care if symptoms persist or worsen despite treatment.

## 2022-11-27 NOTE — ED Triage Notes (Signed)
Pt states he has had cough and congestion since last Thursday. He states she has been taking tylneol and mucinex.

## 2022-11-27 NOTE — ED Provider Notes (Signed)
[facility]    CSN: [id number] Arrival date & time: 11/27/22  1418      History   Chief Complaint Chief Complaint  Patient presents with   Cough   Nasal Congestion    HPI Cody Willis is a 82 y.o. male.   Patient presents today with 5-day history of congested cough, chest congestion, and coughing up thick green mucus.  He denies fever, body aches or chills, shortness of breath or chest pain.  No runny or stuffy nose, sore throat, headache, ear pain.  No abdominal pain, nausea/vomiting, diarrhea, or change in appetite.  Reports he has been a little bit more tired than normal.  Has also been having to use albuterol inhaler more frequently than normal.  Reports wife is not sick with similar symptoms.  Reports at home COVID test was -3 days ago.  Has been taking Tessalon Perles, Mucinex, and Tylenol for symptoms with minimal improvement.    Past Medical History:  Diagnosis Date   Actinic keratosis    Arthritis    fingers   Basal cell carcinoma 02/10/2008   left top of shoulder   Basal cell carcinoma 08/14/2022   superficial, right upper temple, EDC 09/12/22   Basal cell carcinoma 11/14/2022   left upper forehead, EDC   BCC (basal cell carcinoma) 10/21/2003   superficial spinal lower back   Carpal tunnel syndrome    Cataract    bilateral surgery repair   Chronic pancreatitis (HCC)    COPD (chronic obstructive pulmonary disease) (HCC)    COVID-19    Esophageal reflux    Gastric polyps    hyperplastic   Herpes zoster    Hiatal hernia    Hypertension    Melanoma (HCC) 10/21/2003   right mid abdomen Clarks Level II Breslows Measurement 0.42 mm   Other B-complex deficiencies    Pneumonia    Pure hypercholesterolemia    Stroke (HCC) 01/16/2015   only on ASA    Patient Active Problem List   Diagnosis Date Noted   Skin lesions 07/20/2022   S/P right unicompartmental knee replacement 05/01/2022   Hypertension 03/28/2022   Rash 03/12/2022   Right knee  pain 01/12/2022   Coronary artery disease involving native coronary artery of native heart without angina pectoris 02/02/2021   Dyspnea 02/02/2021   Carotid bruit 02/02/2021   Pulmonary nodules 10/28/2020   Aortic atherosclerosis (HCC) 10/28/2020   Hx of melanoma excision 05/29/2018   Proteinuria 04/29/2017   Former smoker 10/31/2015   Preventative health care 07/26/2015   Advance directive discussed with patient 07/26/2015   Occipital neuralgia of right side 05/02/2015   History of CVA (cerebrovascular accident) 03/21/2015   Hyperlipidemia    COPD (chronic obstructive pulmonary disease) (HCC) 08/17/2014   CHRONIC PANCREATITIS 01/03/2010   ACID REFLUX DISEASE 07/02/2007    Past Surgical History:  Procedure Laterality Date   APPENDECTOMY     BACK SURGERY     Diskectomy between L4 and L5   CARPAL TUNNEL RELEASE Left 06/27/2021   Procedure: CARPAL TUNNEL RELEASE LEFT;  Surgeon: Cindee Salt, MD;  Location: [facility];  Service: Orthopedics;  Laterality: Left;  Regional with monitored anesthesia care   CATARACT EXTRACTION W/ INTRAOCULAR LENS  IMPLANT, BILATERAL  12/15 and 2/16   CHOLECYSTECTOMY     COLONOSCOPY  2011   MELANOMA EXCISION  ~2009   right upper abdomen   PARTIAL KNEE ARTHROPLASTY Right 05/01/2022   Procedure: UNICOMPARTMENTAL KNEE;  Surgeon: Sheral Apley,  MD;  Location: [facility];  Service: Orthopedics;  Laterality: Right;       Home Medications    Prior to Admission medications   Medication Sig Start Date End Date Taking? Authorizing Provider  aspirin EC 81 MG tablet Take 1 tablet (81 mg total) by mouth 2 (two) times daily. To prevent blood clots for 30 days after surgery. 05/01/22  Yes Gawne, Meghan M, PA-C  azithromycin (ZITHROMAX) 250 MG tablet Take (2) tablets by mouth on day 1, then take (1) tablet by mouth on days 2-5. 11/27/22  Yes Valentino Nose, NP  benzonatate (TESSALON) 100 MG capsule Take 1 capsule (100 mg total) by mouth 3 (three)  times daily as needed for cough. 06/28/22  Yes Lamptey, Britta Mccreedy, MD  cetirizine (ZYRTEC ALLERGY) 10 MG tablet Take 1 tablet (10 mg total) by mouth daily. 06/28/22  Yes Lamptey, Britta Mccreedy, MD  cyanocobalamin (VITAMIN B12) 1000 MCG/ML injection INJECT INTO THE MUSCLE EVERY 30 DAYS. PHARMACY-PLEASE PROVIDE APPROPRIATE NEEDLES AND SYRINGES 11/16/21  Yes Arnaldo Natal, NP  fluorouracil (EFUDEX) 5 % cream Apply to forehead and temples once daily x 3 weeks. 07/16/22  Yes Deirdre Evener, MD  ketoconazole (NIZORAL) 2 % shampoo use 2 - 3 times weekly, apply topically to scalp , neck and chest, leave on skin for at least 5 minutes before rinsing off. 08/14/22  Yes Willeen Niece, MD  ondansetron (ZOFRAN-ODT) 4 MG disintegrating tablet Take 1 tablet (4 mg total) by mouth every 8 (eight) hours as needed for nausea or vomiting. 05/01/22  Yes Gawne, Meghan M, PA-C  Pancrelipase, Lip-Prot-Amyl, ([name]) 25000-79000 units CPEP Take 2 capsules by mouth with breakfast, with lunch, and with evening meal. 11/16/21  Yes Arnaldo Natal, NP  pantoprazole (PROTONIX) 40 MG tablet Take 1 tablet (40 mg total) by mouth every morning. 11/16/21  Yes Arnaldo Natal, NP  rosuvastatin (CRESTOR) 20 MG tablet Take 1 tablet (20 mg total) by mouth daily. Please call [phone] to schedule an overdue appointment with Dr. Donato Schultz for future refills. Thank you. 2nd attempt. 02/22/22  Yes Glori Luis, MD  sennosides-docusate sodium (SENOKOT-S) 8.6-50 MG tablet Take 2 tablets by mouth daily as needed for constipation (while taking narcotics). 05/01/22  Yes Gawne, Meghan M, PA-C  valsartan (DIOVAN) 80 MG tablet Take 1 tablet (80 mg total) by mouth daily. 03/28/22  Yes Glori Luis, MD  acetaminophen (TYLENOL) 500 MG tablet Take 2 tablets (1,000 mg total) by mouth every 6 (six) hours as needed for mild pain or moderate pain. 05/01/22   Jenne Pane, PA-C  albuterol (VENTOLIN HFA) 108 (90 Base) MCG/ACT  inhaler Inhale 1-2 puffs into the lungs every 4 (four) hours as needed for wheezing or shortness of breath. 11/27/22   Valentino Nose, NP  triamcinolone (KENALOG) 0.025 % ointment Apply 1 Application topically daily as needed. 09/12/22   Willeen Niece, MD    Family History Family History  Problem Relation Age of Onset   Stroke Mother    Diabetes Mother    Emphysema Father        smoker   Crohn's disease Sister    Colitis Brother    Colon cancer Neg Hx    Colon polyps Neg Hx    Esophageal cancer Neg Hx    Rectal cancer Neg Hx    Stomach cancer Neg Hx     Social History Social History   Tobacco Use   Smoking status: Former  Types: Cigarettes   Smokeless tobacco: Never  Vaping Use   Vaping status: Never Used  Substance Use Topics   Alcohol use: No    Alcohol/week: 0.0 standard drinks of alcohol   Drug use: Never     Allergies   Meloxicam, Plavix [clopidogrel bisulfate], and Sulfamethoxazole-trimethoprim   Review of Systems Review of Systems Per HPI  Physical Exam Triage Vital Signs ED Triage Vitals  Encounter Vitals Group     BP 11/27/22 1529 137/64     Systolic BP Percentile --      Diastolic BP Percentile --      Pulse Rate 11/27/22 1529 (!) 58     Resp 11/27/22 1529 18     Temp 11/27/22 1529 97.7 F (36.5 C)     Temp Source 11/27/22 1529 Oral     SpO2 11/27/22 1529 96 %     Weight --      Height --      Head Circumference --      Peak Flow --      Pain Score 11/27/22 1527 0     Pain Loc --      Pain Education --      Exclude from Growth Chart --    No data found.  Updated Vital Signs BP 137/64 (BP Location: Right Arm)   Pulse (!) 58   Temp 97.7 F (36.5 C) (Oral)   Resp 18   SpO2 96%   Visual Acuity Right Eye Distance:   Left Eye Distance:   Bilateral Distance:    Right Eye Near:   Left Eye Near:    Bilateral Near:     Physical Exam Vitals and nursing note reviewed.  Constitutional:      General: He is not in acute  distress.    Appearance: Normal appearance. He is not ill-appearing or toxic-appearing.  HENT:     Head: Normocephalic and atraumatic.     Right Ear: Tympanic membrane, ear canal and external ear normal.     Left Ear: Tympanic membrane, ear canal and external ear normal.     Nose: Congestion and rhinorrhea present.     Mouth/Throat:     Mouth: Mucous membranes are moist.     Pharynx: Oropharynx is clear. Posterior oropharyngeal erythema present. No oropharyngeal exudate.  Eyes:     General: No scleral icterus.    Extraocular Movements: Extraocular movements intact.  Cardiovascular:     Rate and Rhythm: Normal rate and regular rhythm.  Pulmonary:     Effort: Pulmonary effort is normal. No respiratory distress.     Breath sounds: Normal breath sounds. No wheezing, rhonchi or rales.  Musculoskeletal:     Cervical back: Normal range of motion and neck supple.  Lymphadenopathy:     Cervical: No cervical adenopathy.  Skin:    General: Skin is warm and dry.     Coloration: Skin is not jaundiced or pale.     Findings: No erythema or rash.  Neurological:     Mental Status: He is alert and oriented to person, place, and time.  Psychiatric:        Behavior: Behavior is cooperative.      UC Treatments / Results  Labs (all labs ordered are listed, but only abnormal results are displayed) Labs Reviewed - No data to display  EKG   Radiology No results found.  Procedures Procedures (including critical care time)  Medications Ordered in UC Medications - No data to display  Initial Impression /  Assessment and Plan / UC Course  I have reviewed the triage vital signs and the nursing notes.  Pertinent labs & imaging results that were available during my care of the patient were reviewed by me and considered in my medical decision making (see chart for details).   Patient is well-appearing, normotensive, afebrile, not tachycardic, not tachypneic, oxygenating well on room air.     1. COPD exacerbation (HCC) Patient is out of window for Paxlovid/molnupiravir, viral testing deferred today Vitals and exam are stable and reassuring Will treat for COPD exacerbation with azithromycin 500 mg today, then 250 mg days 2 through 5 Continue albuterol, Mucinex, hydration Corticosteroids deferred given no wheezing, chest tightness Patient moving air well to bilateral lung fields, talking in complete sentences without accessory muscle use Strict ER and return precautions discussed  The patient was given the opportunity to ask questions.  All questions answered to their satisfaction.  The patient is in agreement to this plan.    Final Clinical Impressions(s) / UC Diagnoses   Final diagnoses:  COPD exacerbation (HCC)     Discharge Instructions      As we discussed, I suspect you have an exacerbation of your chronic breathing condition likely due to a viral illness or bacterial cause.  Take the azithromycin as prescribed to treat it.  Continue butyryl inhaler as needed for wheezing/shortness of breath.  Continue cough Perles and Mucinex.  Make sure you are drinking plenty fluids.  Seek care if symptoms persist or worsen despite treatment.     ED Prescriptions     Medication Sig Dispense Auth. Provider   albuterol (VENTOLIN HFA) 108 (90 Base) MCG/ACT inhaler Inhale 1-2 puffs into the lungs every 4 (four) hours as needed for wheezing or shortness of breath. 2 each Valentino Nose, NP   azithromycin (ZITHROMAX) 250 MG tablet Take (2) tablets by mouth on day 1, then take (1) tablet by mouth on days 2-5. 6 tablet Valentino Nose, NP      PDMP not reviewed this encounter.   Valentino Nose, NP 11/27/22 [phone]

## 2022-11-28 ENCOUNTER — Ambulatory Visit: Payer: HMO | Admitting: Dermatology

## 2022-11-30 DIAGNOSIS — H6123 Impacted cerumen, bilateral: Secondary | ICD-10-CM | POA: Diagnosis not present

## 2022-12-07 ENCOUNTER — Telehealth: Payer: Self-pay | Admitting: Nurse Practitioner

## 2022-12-07 DIAGNOSIS — Z8719 Personal history of other diseases of the digestive system: Secondary | ICD-10-CM

## 2022-12-07 MED ORDER — PANTOPRAZOLE SODIUM 40 MG PO TBEC: 40.0000 mg | DELAYED_RELEASE_TABLET | Freq: Every morning | ORAL | 0 refills | Status: DC

## 2022-12-07 NOTE — Telephone Encounter (Signed)
DD, ok to refill Pantoprazole 40mg  one po every day to be taken 30 minutes before breakfast # 90, no refills

## 2022-12-07 NOTE — Telephone Encounter (Signed)
PT needs to have a refill for protonix sent to [facility]

## 2022-12-18 ENCOUNTER — Encounter (HOSPITAL_COMMUNITY): Payer: Self-pay | Admitting: Emergency Medicine

## 2022-12-18 ENCOUNTER — Other Ambulatory Visit: Payer: Self-pay

## 2022-12-18 ENCOUNTER — Telehealth: Payer: Self-pay | Admitting: Diagnostic Neuroimaging

## 2022-12-18 ENCOUNTER — Emergency Department (HOSPITAL_COMMUNITY)
Admission: EM | Admit: 2022-12-18 | Discharge: 2022-12-19 | Disposition: A | Discharge status: Home / Self Care | Payer: HMO | Attending: Emergency Medicine | Admitting: Emergency Medicine

## 2022-12-18 ENCOUNTER — Emergency Department (HOSPITAL_COMMUNITY): Payer: HMO

## 2022-12-18 DIAGNOSIS — R519 Headache, unspecified: Secondary | ICD-10-CM | POA: Diagnosis not present

## 2022-12-18 DIAGNOSIS — R9082 White matter disease, unspecified: Secondary | ICD-10-CM | POA: Insufficient documentation

## 2022-12-18 DIAGNOSIS — Z8673 Personal history of transient ischemic attack (TIA), and cerebral infarction without residual deficits: Secondary | ICD-10-CM | POA: Diagnosis not present

## 2022-12-18 DIAGNOSIS — I6782 Cerebral ischemia: Secondary | ICD-10-CM | POA: Diagnosis not present

## 2022-12-18 DIAGNOSIS — I10 Essential (primary) hypertension: Secondary | ICD-10-CM | POA: Insufficient documentation

## 2022-12-18 DIAGNOSIS — Z7982 Long term (current) use of aspirin: Secondary | ICD-10-CM | POA: Insufficient documentation

## 2022-12-18 DIAGNOSIS — H538 Other visual disturbances: Secondary | ICD-10-CM | POA: Diagnosis not present

## 2022-12-18 DIAGNOSIS — R4182 Altered mental status, unspecified: Secondary | ICD-10-CM | POA: Diagnosis not present

## 2022-12-18 DIAGNOSIS — R410 Disorientation, unspecified: Secondary | ICD-10-CM | POA: Diagnosis not present

## 2022-12-18 LAB — COMPREHENSIVE METABOLIC PANEL
ALT: 17 U/L (ref 0–44)
AST: 25 U/L (ref 15–41)
Albumin: 4.3 g/dL (ref 3.5–5.0)
Alkaline Phosphatase: 59 U/L (ref 38–126)
Anion gap: 10 (ref 5–15)
BUN: 18 mg/dL (ref 8–23)
CO2: 27 mmol/L (ref 22–32)
Calcium: 9 mg/dL (ref 8.9–10.3)
Chloride: 103 mmol/L (ref 98–111)
Creatinine, Ser: 1.08 mg/dL (ref 0.61–1.24)
GFR, Estimated: >60 mL/min (ref >60)
Glucose, Bld: 109 mg/dL — ABNORMAL HIGH (ref 70–99)
Potassium: 3.8 mmol/L (ref 3.5–5.1)
Sodium: 140 mmol/L (ref 135–145)
Total Bilirubin: 0.6 mg/dL (ref 0.3–1.2)
Total Protein: 6.5 g/dL (ref 6.5–8.1)

## 2022-12-18 LAB — CBC
HCT: 39.8 % (ref 39.0–52.0)
Hemoglobin: 13.1 g/dL (ref 13.0–17.0)
MCH: 30 pg (ref 26.0–34.0)
MCHC: 32.9 g/dL (ref 30.0–36.0)
MCV: 91.3 fL (ref 80.0–100.0)
Platelets: 135 10*3/uL — ABNORMAL LOW (ref 150–400)
RBC: 4.36 MIL/uL (ref 4.22–5.81)
RDW: 14.1 % (ref 11.5–15.5)
WBC: 8.2 10*3/uL (ref 4.0–10.5)
nRBC: 0 % (ref 0.0–0.2)

## 2022-12-18 LAB — DIFFERENTIAL
Abs Immature Granulocytes: 0.07 10*3/uL (ref 0.00–0.07)
Basophils Absolute: 0.1 10*3/uL (ref 0.0–0.1)
Basophils Relative: 1 %
Eosinophils Absolute: 0.2 10*3/uL (ref 0.0–0.5)
Eosinophils Relative: 3 %
Immature Granulocytes: 1 %
Lymphocytes Relative: 24 %
Lymphs Abs: 2 10*3/uL (ref 0.7–4.0)
Monocytes Absolute: 1 10*3/uL (ref 0.1–1.0)
Monocytes Relative: 12 %
Neutro Abs: 4.8 10*3/uL (ref 1.7–7.7)
Neutrophils Relative %: 59 %

## 2022-12-18 LAB — URINALYSIS, ROUTINE W REFLEX MICROSCOPIC
Bilirubin Urine: NEGATIVE
Glucose, UA: NEGATIVE mg/dL
Hgb urine dipstick: NEGATIVE
Ketones, ur: NEGATIVE mg/dL
Leukocytes,Ua: NEGATIVE
Nitrite: NEGATIVE
Protein, ur: NEGATIVE mg/dL
Specific Gravity, Urine: 1.013 (ref 1.005–1.030)
pH: 5 (ref 5.0–8.0)

## 2022-12-18 LAB — PROTIME-INR
INR: 1 (ref 0.8–1.2)
Prothrombin Time: 13.3 s (ref 11.4–15.2)

## 2022-12-18 LAB — ETHANOL: Alcohol, Ethyl (B): <10 mg/dL (ref <10)

## 2022-12-18 LAB — APTT: aPTT: 28 s (ref 24–36)

## 2022-12-18 LAB — RAPID URINE DRUG SCREEN, HOSP PERFORMED
Amphetamines: NOT DETECTED
Barbiturates: NOT DETECTED
Benzodiazepines: NOT DETECTED
Cocaine: NOT DETECTED
Opiates: NOT DETECTED
Tetrahydrocannabinol: NOT DETECTED

## 2022-12-18 NOTE — Telephone Encounter (Signed)
Agree with advice given. Please make sure he went to get evaluated today. Thanks.

## 2022-12-18 NOTE — ED Provider Triage Note (Signed)
Emergency Medicine Provider Triage Evaluation Note  Cody Willis , a 82 y.o. male  was evaluated in triage.  Pt complains of episodes of confusion for the last 4 days.  Notes that he had an episode last night that was also accompanied with blurry vision.  Has also had intermittent occipital headaches and difficulty walking.  History of previous CVA.  Sent by PCP for CT scan of the brain.  Other than the difficulty walking, no focal weakness.  No chest pain or shortness of breath.  No recent illness.  No residual deficits from previous CVA.  Review of Systems  Positive: See HPI Negative: See HPI  Physical Exam  BP 128/60 (BP Location: Left Arm)   Pulse 63   Temp 97.6 F (36.4 C)   Resp 20   Ht 5' 6.5" (1.689 m)   Wt 75.3 kg   SpO2 97%   BMI 26.39 kg/m  Gen:   Awake, no distress   Resp:  Normal effort  MSK:   Moves extremities without difficulty  Other:  Alert and oriented, 5/5 strength to the bilateral upper and lower extremities, cranial nerves intact, subjective normal sensation to light touch, normal speech, no pronator drift  Medical Decision Making  Medically screening exam initiated at 2:57 PM.  Appropriate orders placed.  Cody Willis was informed that the remainder of the evaluation will be completed by another provider, this initial triage assessment does not replace that evaluation, and the importance of remaining in the ED until their evaluation is complete.     Tonette Lederer, PA-C 12/18/22 1458

## 2022-12-18 NOTE — Discharge Instructions (Signed)
Please follow-up with your established neurologist this week. Do not drive until cleared. Recommended for routine EEG.

## 2022-12-18 NOTE — ED Notes (Addendum)
This paramedic went into pt room and pt stated he has not been seen by a Doctor or PA since arriving into room 31. This paramedic noted there is no attending physician for this pt. This paramedic informed the Resident [place].

## 2022-12-18 NOTE — ED Triage Notes (Signed)
Patient arrives ambulatory by POV states on Friday having episodes of not knowing what was going on and having a soreness to back of his head. Denies any injury. Denies any dizziness. Equal grip strength and sensation. States he just feels off steady when walking.

## 2022-12-18 NOTE — Telephone Encounter (Signed)
Please call the patient and get more details.  He needs to be seen in person today given that he had symptoms overnight.  Based on the symptoms that he is having he may need to be seen in the emergency department.

## 2022-12-18 NOTE — Telephone Encounter (Signed)
Patient just called needing a referral to [facility] to Dr. Marjory Lies. The fax number is [phone].

## 2022-12-18 NOTE — Telephone Encounter (Signed)
Patient's medical record has notes from the ED today at 2:00.

## 2022-12-18 NOTE — ED Notes (Signed)
Pt returned from MRI °

## 2022-12-18 NOTE — Telephone Encounter (Signed)
Called Patient and he states 12/15/22 he was driving and got dizzy and disoriented and did not know where he was or how to get home. The Patient also states on 12/17/22 he was in bed and got dizzy and disoriented and did not know where he was or what was going on. Patient requested a referral to Neurology but I spoke with Dr. Birdie Sons who advised that he be seen at the ED or walk in clinic today due to he could have had a stroke. Patient states he will get checked out.

## 2022-12-18 NOTE — ED Provider Notes (Signed)
[place] EMERGENCY DEPARTMENT AT [facility] Provider Note   CSN: [id number] Arrival date & time: 12/18/22  1304     History  Chief Complaint  Patient presents with   Headache    Cody Willis is a 82 y.o. male.  Patient is an 82 year old male with past medical history of previous CVA, GERD, hypertension, hyperlipidemia presenting for complaints of episodes of altered mental status and confusion.  Patient states on Saturday approximately 4 days ago he was driving when he found himself driving down the wrong side of the road and confused and unable to figure out where he was.  He states the symptoms recovered and he was able to drive home.  He states then last night he had another episode where he was driving and found himself confused.  He denies any falls or head trauma.  He denies any infectious signs and symptoms including no fevers, chills, nausea, vomiting, diarrhea.  He missed a previous occipital stroke in 2016.  The history is provided by the patient. No language interpreter was used.  Headache Associated symptoms: no abdominal pain, no back pain, no cough, no ear pain, no eye pain, no fever, no seizures, no sore throat and no vomiting        Home Medications Prior to Admission medications   Medication Sig Start Date End Date Taking? Authorizing Provider  acetaminophen (TYLENOL) 500 MG tablet Take 2 tablets (1,000 mg total) by mouth every 6 (six) hours as needed for mild pain or moderate pain. 05/01/22   Cody Pane, PA-C  albuterol (VENTOLIN HFA) 108 (90 Base) MCG/ACT inhaler Inhale 1-2 puffs into the lungs every 4 (four) hours as needed for wheezing or shortness of breath. 11/27/22   Cody Nose, NP  aspirin EC 81 MG tablet Take 1 tablet (81 mg total) by mouth 2 (two) times daily. To prevent blood clots for 30 days after surgery. 05/01/22   Cody Pane, PA-C  azithromycin (ZITHROMAX) 250 MG tablet Take (2) tablets by mouth on day 1, then take (1)  tablet by mouth on days 2-5. 11/27/22   Cody Nose, NP  benzonatate (TESSALON) 100 MG capsule Take 1 capsule (100 mg total) by mouth 3 (three) times daily as needed for cough. 06/28/22   Cody Jansky, MD  cetirizine (ZYRTEC ALLERGY) 10 MG tablet Take 1 tablet (10 mg total) by mouth daily. 06/28/22   Lamptey, Britta Mccreedy, MD  cyanocobalamin (VITAMIN B12) 1000 MCG/ML injection INJECT INTO THE MUSCLE EVERY 30 DAYS. PHARMACY-PLEASE PROVIDE APPROPRIATE NEEDLES AND SYRINGES 11/16/21   Arnaldo Natal, NP  fluorouracil (EFUDEX) 5 % cream Apply to forehead and temples once daily x 3 weeks. 07/16/22   Deirdre Evener, MD  ketoconazole (NIZORAL) 2 % shampoo use 2 - 3 times weekly, apply topically to scalp , neck and chest, leave on skin for at least 5 minutes before rinsing off. 08/14/22   Cody Niece, MD  ondansetron (ZOFRAN-ODT) 4 MG disintegrating tablet Take 1 tablet (4 mg total) by mouth every 8 (eight) hours as needed for nausea or vomiting. 05/01/22   Cody Fresh M, PA-C  Pancrelipase, Lip-Prot-Amyl, ([name]) 25000-79000 units CPEP Take 2 capsules by mouth with breakfast, with lunch, and with evening meal. 11/16/21   Arnaldo Natal, NP  pantoprazole (PROTONIX) 40 MG tablet Take 1 tablet (40 mg total) by mouth every morning. 12/07/22   Arnaldo Natal, NP  rosuvastatin (CRESTOR) 20 MG tablet Take 1 tablet (20  mg total) by mouth daily. Please call [phone] to schedule an overdue appointment with Dr. Donato Schultz for future refills. Thank you. 2nd attempt. 02/22/22   Glori Luis, MD  sennosides-docusate sodium (SENOKOT-S) 8.6-50 MG tablet Take 2 tablets by mouth daily as needed for constipation (while taking narcotics). 05/01/22   Cody Pane, PA-C  triamcinolone (KENALOG) 0.025 % ointment Apply 1 Application topically daily as needed. 09/12/22   Cody Niece, MD  valsartan (DIOVAN) 80 MG tablet Take 1 tablet (80 mg total) by mouth daily. 03/28/22   Glori Luis, MD      Allergies    Meloxicam, Plavix [clopidogrel bisulfate], and Sulfamethoxazole-trimethoprim    Review of Systems   Review of Systems  Constitutional:  Negative for chills and fever.  HENT:  Negative for ear pain and sore throat.   Eyes:  Negative for pain and visual disturbance.  Respiratory:  Negative for cough and shortness of breath.   Cardiovascular:  Negative for chest pain and palpitations.  Gastrointestinal:  Negative for abdominal pain and vomiting.  Genitourinary:  Negative for dysuria and hematuria.  Musculoskeletal:  Negative for arthralgias and back pain.  Skin:  Negative for color change and rash.  Neurological:  Positive for headaches. Negative for seizures and syncope.  Psychiatric/Behavioral:  Positive for confusion.   All other systems reviewed and are negative.   Physical Exam Updated Vital Signs BP (!) 142/57   Pulse 64   Temp (!) 97.4 F (36.3 C) (Oral)   Resp 18   Ht 5' 6.5" (1.689 Willis)   Wt 75.3 kg   SpO2 100%   BMI 26.39 kg/Willis  Physical Exam Vitals and nursing note reviewed.  Constitutional:      General: He is not in acute distress.    Appearance: He is well-developed.  HENT:     Head: Normocephalic and atraumatic.  Eyes:     General: Lids are normal. Vision grossly intact.     Conjunctiva/sclera: Conjunctivae normal.     Pupils: Pupils are equal, round, and reactive to light.  Cardiovascular:     Rate and Rhythm: Normal rate and regular rhythm.     Heart sounds: No murmur heard. Pulmonary:     Effort: Pulmonary effort is normal. No respiratory distress.     Breath sounds: Normal breath sounds.  Abdominal:     Palpations: Abdomen is soft.     Tenderness: There is no abdominal tenderness.  Musculoskeletal:        General: No swelling.     Cervical back: Neck supple.  Skin:    General: Skin is warm and dry.     Capillary Refill: Capillary refill takes less than 2 seconds.  Neurological:     Mental Status: He is alert and  oriented to person, place, and time.     GCS: GCS eye subscore is 4. GCS verbal subscore is 5. GCS motor subscore is 6.     Cranial Nerves: Cranial nerves 2-12 are intact.     Sensory: Sensation is intact.     Motor: Motor function is intact.     Coordination: Coordination is intact.  Psychiatric:        Mood and Affect: Mood normal.     ED Results / Procedures / Treatments   Labs (all labs ordered are listed, but only abnormal results are displayed) Labs Reviewed  CBC - Abnormal; Notable for the following components:      Result Value   Platelets 135 (*)  All other components within normal limits  COMPREHENSIVE METABOLIC PANEL - Abnormal; Notable for the following components:   Glucose, Bld 109 (*)    All other components within normal limits  ETHANOL  PROTIME-INR  APTT  DIFFERENTIAL  RAPID URINE DRUG SCREEN, HOSP PERFORMED  URINALYSIS, ROUTINE W REFLEX MICROSCOPIC  I-STAT CHEM 8, ED    EKG None  Radiology MR BRAIN WO CONTRAST  Result Date: 12/18/2022 CLINICAL DATA:  Altered mental status and blurry vision EXAM: MRI HEAD WITHOUT CONTRAST TECHNIQUE: Multiplanar, multiecho pulse sequences of the brain and surrounding structures were obtained without intravenous contrast. COMPARISON:  02/08/2015 FINDINGS: Brain: No acute infarct, mass effect or extra-axial collection. Trace left temporal siderosis. There is multifocal hyperintense T2-weighted signal within the white matter. Parenchymal volume and CSF spaces are normal. Multiple old cerebellar small vessel infarcts. The midline structures are normal. Vascular: Diminutive but otherwise normal left ICA flow void. The other flow voids are normal. Unchanged. Skull and upper cervical spine: Normal calvarial bone marrow signal. Chronic signal changes of the upper dens are unchanged. Sinuses/Orbits: Paranasal sinuses are clear. No mastoid effusion. Normal orbits. Bilateral ocular lens replacements. Other: None IMPRESSION: 1. No acute  intracranial abnormality. 2. Multiple old cerebellar small vessel infarcts and findings of chronic small vessel ischemia. Electronically Signed   By: Deatra Robinson Willis.D.   On: 12/18/2022 22:28   CT Head Wo Contrast  Result Date: 12/18/2022 CLINICAL DATA:  Altered mental status, headache EXAM: CT HEAD WITHOUT CONTRAST TECHNIQUE: Contiguous axial images were obtained from the base of the skull through the vertex without intravenous contrast. RADIATION DOSE REDUCTION: This exam was performed according to the departmental dose-optimization program which includes automated exposure control, adjustment of the mA and/or kV according to patient size and/or use of iterative reconstruction technique. COMPARISON:  01/16/2015 FINDINGS: Brain: Normal anatomic configuration. Parenchymal volume loss is commensurate with the patient's age. Mild periventricular white matter changes are present likely reflecting the sequela of small vessel ischemia. Remote left cerebellar infarct noted, new since prior examination, however. No abnormal intra or extra-axial mass lesion or fluid collection. No abnormal mass effect or midline shift. No evidence of acute intracranial hemorrhage or infarct. Ventricular size is normal. Cerebellum unremarkable. Vascular: No asymmetric hyperdense vasculature at the skull base. Skull: Intact Sinuses/Orbits: Paranasal sinuses are clear. Orbits are unremarkable. Other: Mastoid air cells and middle ear cavities are clear. IMPRESSION: 1. No acute intracranial hemorrhage or infarct. 2. Remote left cerebellar infarct, new since prior examination of 01/16/2015. 3. Stable senescent change. Electronically Signed   By: Helyn Numbers Willis.D.   On: 12/18/2022 17:53    Procedures Procedures    Medications Ordered in ED Medications - No data to display  ED Course/ Medical Decision Making/ A&P                                 Medical Decision Making Amount and/or Complexity of Data Reviewed Radiology:  ordered.   82 year old male with past medical history of previous CVA, GERD, hypertension, hyperlipidemia presenting for complaints of episodes of altered mental status and confusion.  Exam patient is alert and oriented x 3, no acute distress, afebrile, stable vital signs.  No neurovascular deficits on exam.  No confusion.  Patient alert and oriented x 3.  GCS of 15.  CT head concerning for remote left cerebellar infarct.  New from previous imaging on 2016.MRI demonstrates multiple old cerebellar small vessel infarcts  and findings of chronic small vessel ischemia.  No acute intracranial abnormalities.  No signs or symptoms of sepsis, hypoxia, hypoglycemia, anemia, or in any other reasons to account for patient's episodes of confusion.  Felt to neurology placed.  I spoke with neurologist who recommends routine eeg and f/u with Dr. Marjory Lies his established neurologist.   Patient instructed to not drive under any circumstances untilled cleared by neurology.  Detailed discussions were had with the patient regarding current findings, and need for close f/u with PCP or on call doctor. The patient has been instructed to return immediately if the symptoms worsen in any way for re-evaluation. Patient verbalized understanding and is in agreement with current care plan. All questions answered prior to discharge.        Final Clinical Impression(s) / ED Diagnoses Final diagnoses:  Confusion    Rx / DC Orders ED Discharge Orders     None         Franne Forts, DO 12/18/22 2341

## 2022-12-18 NOTE — Telephone Encounter (Signed)
Pt said having a new symptoms of dizziness, oriented; happened last Friday while driving. Monday, 12/19/22 Midnight happened at home while in the bed.  Advised pt to contact PCP to send a referral. Inform pt in last office visit note Dr. Marjory Lies returned pt back to PCP.

## 2022-12-18 NOTE — ED Notes (Signed)
Patient transported to MRI 

## 2022-12-19 ENCOUNTER — Other Ambulatory Visit: Payer: Self-pay | Admitting: Neurology

## 2022-12-19 DIAGNOSIS — Z8673 Personal history of transient ischemic attack (TIA), and cerebral infarction without residual deficits: Secondary | ICD-10-CM

## 2022-12-19 LAB — I-STAT CHEM 8, ED
BUN: 20 mg/dL (ref 8–23)
Calcium, Ion: 1.19 mmol/L (ref 1.15–1.40)
Chloride: 102 mmol/L (ref 98–111)
Creatinine, Ser: 1.2 mg/dL (ref 0.61–1.24)
Glucose, Bld: 107 mg/dL — ABNORMAL HIGH (ref 70–99)
HCT: 40 % (ref 39.0–52.0)
Hemoglobin: 13.6 g/dL (ref 13.0–17.0)
Potassium: 4 mmol/L (ref 3.5–5.1)
Sodium: 142 mmol/L (ref 135–145)
TCO2: 28 mmol/L (ref 22–32)

## 2022-12-19 NOTE — Telephone Encounter (Signed)
Please advise, patient calling in to schedule hospital f/u. Looks like ED wanted him to f/u this week with Dr. Demetrius Charity and have EEG as well I have nothing available anytime soon

## 2022-12-20 NOTE — Telephone Encounter (Signed)
Looks like pt will be seeing Dr Marjory Lies tomorrow 9/13 and EEG is scheduled for 9/23. Are there any sooner open EEGs?

## 2022-12-20 NOTE — Telephone Encounter (Signed)
Ok thanks. FYI Dr Marjory Lies.

## 2022-12-20 NOTE — Telephone Encounter (Signed)
Cody Willis doesn't have anything sooner as of right now.

## 2022-12-21 ENCOUNTER — Encounter: Payer: Self-pay | Admitting: Diagnostic Neuroimaging

## 2022-12-21 ENCOUNTER — Ambulatory Visit (INDEPENDENT_AMBULATORY_CARE_PROVIDER_SITE_OTHER): Payer: HMO | Admitting: Diagnostic Neuroimaging

## 2022-12-21 VITALS — BP 139/65 | HR 59 | Ht 66.0 in | Wt 171.0 lb

## 2022-12-21 DIAGNOSIS — G459 Transient cerebral ischemic attack, unspecified: Secondary | ICD-10-CM | POA: Diagnosis not present

## 2022-12-21 NOTE — Progress Notes (Signed)
[facility]  PATIENT: Cody Willis DOB: 04-Dec-1940  REFERRING CLINICIAN: Glori Luis, MD HISTORY FROM: patient REASON FOR VISIT: new consult   HISTORICAL  CHIEF COMPLAINT:  Chief Complaint  Patient presents with   Hospitalization Follow-up    Rm 7, Hospital follow up, here with wife. He went to ER on 9/10, concerns of remote stroke, need EEG (scheduled for 9/23). Pt reports he feels back to normal. He also had an episode last Friday and then Monday night. He states when he turned over in bed things were blurry, out of shape. No witnessed seizure.    HISTORY OF PRESENT ILLNESS:   82 year old male here for evaluation of DOT physical evaluation due to history of stroke.  October 2016 patient had acute onset of vertigo and nausea.  Patient went to the hospital for evaluation and was found to have left cerebellar ischemic infarctions.  CT of the head and neck demonstrated left vertebral artery high-grade stenosis versus occlusion.  He was treated with dual antiplatelet and statin therapy.  Smoking cessation was encouraged.  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  He has been working and passing his DOT physical since that time.  Patient went to get DOT physical recently and this time was asked to have clearance /evaluation from cardiology and neurology due to his history.  No recurrence of stroke symptoms.  He is doing well.  He does have some right knee pain and is planning to have knee surgery.  He is tolerating his medications.   REVIEW OF SYSTEMS: Full 14 system review of systems performed and negative with exception of: As per HPI.  ALLERGIES: Allergies  Allergen Reactions   Meloxicam Other (See Comments)    "Tears my stomach up"   Plavix [Clopidogrel Bisulfate] Itching   Sulfamethoxazole-Trimethoprim Rash    HOME MEDICATIONS: Outpatient Medications Prior to  Visit  Medication Sig Dispense Refill   acetaminophen (TYLENOL) 500 MG tablet Take 2 tablets (1,000 mg total) by mouth every 6 (six) hours as needed for mild pain or moderate pain. 60 tablet 0   albuterol (VENTOLIN HFA) 108 (90 Base) MCG/ACT inhaler Inhale 1-2 puffs into the lungs every 4 (four) hours as needed for wheezing or shortness of breath. 2 each 1   aspirin EC 81 MG tablet Take 1 tablet (81 mg total) by mouth 2 (two) times daily. To prevent blood clots for 30 days after surgery. (Patient taking differently: Take 81 mg by mouth daily. To prevent blood clots for 30 days after surgery.) 60 tablet 0   cyanocobalamin (VITAMIN B12) 1000 MCG/ML injection INJECT INTO THE MUSCLE EVERY 30 DAYS. PHARMACY-PLEASE PROVIDE APPROPRIATE NEEDLES AND SYRINGES 3 mL 3   fluorouracil (EFUDEX) 5 % cream Apply to forehead and temples once daily x 3 weeks. 40 g 0   ketoconazole (NIZORAL) 2 % shampoo use 2 - 3 times weekly, apply topically to scalp , neck and chest, leave on skin for at least 5 minutes before rinsing off. 120 mL 6   Pancrelipase, Lip-Prot-Amyl, ([name]) 25000-79000 units CPEP Take 2 capsules by mouth with breakfast, with lunch, and with evening meal. 540 capsule 3   pantoprazole (PROTONIX) 40 MG tablet Take 1 tablet (40 mg total) by mouth every morning. 90 tablet 0   rosuvastatin (CRESTOR) 20 MG tablet Take 1 tablet (20 mg total) by mouth daily. Please call [phone] to schedule  an overdue appointment with Dr. Donato Schultz for future refills. Thank you. 2nd attempt. 90 tablet 3   triamcinolone (KENALOG) 0.025 % ointment Apply 1 Application topically daily as needed. 80 g 0   valsartan (DIOVAN) 80 MG tablet Take 1 tablet (80 mg total) by mouth daily. 90 tablet 3   azithromycin (ZITHROMAX) 250 MG tablet Take (2) tablets by mouth on day 1, then take (1) tablet by mouth on days 2-5. (Patient not taking: Reported on 12/21/2022) 6 tablet 0   benzonatate (TESSALON) 100 MG capsule Take 1 capsule (100 mg  total) by mouth 3 (three) times daily as needed for cough. (Patient not taking: Reported on 12/21/2022) 21 capsule 0   cetirizine (ZYRTEC ALLERGY) 10 MG tablet Take 1 tablet (10 mg total) by mouth daily. (Patient not taking: Reported on 12/21/2022) 30 tablet 0   ondansetron (ZOFRAN-ODT) 4 MG disintegrating tablet Take 1 tablet (4 mg total) by mouth every 8 (eight) hours as needed for nausea or vomiting. (Patient not taking: Reported on 12/21/2022) 15 tablet 0   sennosides-docusate sodium (SENOKOT-S) 8.6-50 MG tablet Take 2 tablets by mouth daily as needed for constipation (while taking narcotics). (Patient not taking: Reported on 12/21/2022) 30 tablet 1   No facility-administered medications prior to visit.    PAST MEDICAL HISTORY: Past Medical History:  Diagnosis Date   Actinic keratosis    Arthritis    fingers   Basal cell carcinoma 02/10/2008   left top of shoulder   Basal cell carcinoma 08/14/2022   superficial, right upper temple, [facility] 09/12/22   Basal cell carcinoma 11/14/2022   left upper forehead, [facility]   BCC (basal cell carcinoma) 10/21/2003   superficial spinal lower back   Carpal tunnel syndrome    Cataract    bilateral surgery repair   Chronic pancreatitis (HCC)    COPD (chronic obstructive pulmonary disease) (HCC)    COVID-19    Esophageal reflux    Gastric polyps    hyperplastic   Herpes zoster    Hiatal hernia    Hypertension    Melanoma (HCC) 10/21/2003   right mid abdomen Clarks Level II Breslows Measurement 0.42 mm   Other B-complex deficiencies    Pneumonia    Pure hypercholesterolemia    Stroke (HCC) 01/16/2015   only on ASA    PAST SURGICAL HISTORY: Past Surgical History:  Procedure Laterality Date   APPENDECTOMY     BACK SURGERY     Diskectomy between L4 and L5   CARPAL TUNNEL RELEASE Left 06/27/2021   Procedure: CARPAL TUNNEL RELEASE LEFT;  Surgeon: Cindee Salt, MD;  Location: [facility];  Service: Orthopedics;  Laterality: Left;  Regional  with monitored anesthesia care   CATARACT EXTRACTION W/ INTRAOCULAR LENS  IMPLANT, BILATERAL  12/15 and 2/16   CHOLECYSTECTOMY     COLONOSCOPY  2011   MELANOMA EXCISION  ~2009   right upper abdomen   PARTIAL KNEE ARTHROPLASTY Right 05/01/2022   Procedure: UNICOMPARTMENTAL KNEE;  Surgeon: Sheral Apley, MD;  Location: [facility];  Service: Orthopedics;  Laterality: Right;    FAMILY HISTORY: Family History  Problem Relation Age of Onset   Stroke Mother    Diabetes Mother    Emphysema Father        smoker   Crohn's disease Sister    Colitis Brother    Colon cancer Neg Hx    Colon polyps Neg Hx    Esophageal cancer Neg Hx    Rectal cancer Neg  Hx    Stomach cancer Neg Hx     SOCIAL HISTORY: Social History   Socioeconomic History   Marital status: Married    Spouse name: [name]   Number of children: 2   Years of education: high school, some college   Highest education level: Not on file  Occupational History   Occupation: Truck driver-- local    Comment: part time  Tobacco Use   Smoking status: Former    Types: Cigarettes   Smokeless tobacco: Never  Vaping Use   Vaping status: Never Used  Substance and Sexual Activity   Alcohol use: No    Alcohol/week: 0.0 standard drinks of alcohol   Drug use: Never   Sexual activity: Not on file  Other Topics Concern   Not on file  Social History Narrative   No living will   Wife should make decisions for him if needed   Would accept resuscitation attempts   Not sure about tube feeds   Lives [name]   2 children and 2 step children - some children live nearby   Has grandchildren and step grandchildren - 6 total   Enjoys: work   Work - Naval architect, has to stay in good shape for that   Social Determinants of Corporate investment banker Strain: Low Risk  (03/06/2022)   Overall Financial Resource Strain (CARDIA)    Difficulty of Paying Living Expenses: Not hard at all  Food Insecurity: No Food Insecurity (03/06/2022)   Hunger  Vital Sign    Worried About Running Out of Food in the Last Year: Never true    Ran Out of Food in the Last Year: Never true  Transportation Needs: No Transportation Needs (03/06/2022)   PRAPARE - Administrator, Civil Service (Medical): No    Lack of Transportation (Non-Medical): No  Physical Activity: Sufficiently Active (03/06/2022)   Exercise Vital Sign    Days of Exercise per Week: 5 days    Minutes of Exercise per Session: 30 min  Stress: No Stress Concern Present (03/06/2022)   [employer] of Occupational Health - Occupational Stress Questionnaire    Feeling of Stress : Not at all  Social Connections: Unknown (03/06/2022)   Social Connection and Isolation Panel [NHANES]    Frequency of Communication with Friends and Family: Not on file    Frequency of Social Gatherings with Friends and Family: Not on file    Attends Religious Services: Not on file    Active Member of Clubs or Organizations: Not on file    Attends Banker Meetings: Not on file    Marital Status: Married  Intimate Partner Violence: Not At Risk (03/06/2022)   Humiliation, Afraid, Rape, and Kick questionnaire    Fear of Current or Ex-Partner: No    Emotionally Abused: No    Physically Abused: No    Sexually Abused: No     PHYSICAL EXAM  GENERAL EXAM/CONSTITUTIONAL: Vitals:  Vitals:   12/21/22 1016  BP: 139/65  Pulse: (!) 59  Weight: 171 lb (77.6 kg)  Height: 5\' 6"  (1.676 m)   Body mass index is 27.6 kg/m. Wt Readings from Last 3 Encounters:  12/21/22 171 lb (77.6 kg)  12/18/22 166 lb (75.3 kg)  07/20/22 166 lb (75.3 kg)   Patient is in no distress; well developed, nourished and groomed; neck is supple  CARDIOVASCULAR: Examination of carotid arteries is normal; no carotid bruits Regular rate and rhythm, no murmurs Examination of peripheral vascular system  by observation and palpation is normal  EYES: Ophthalmoscopic exam of optic discs and posterior segments is  normal; no papilledema or hemorrhages No results found.  MUSCULOSKELETAL: Gait, strength, tone, movements noted in Neurologic exam below  NEUROLOGIC: MENTAL STATUS:      No data to display         awake, alert, oriented to person, place and time recent and remote memory intact normal attention and concentration language fluent, comprehension intact, naming intact fund of knowledge appropriate  CRANIAL NERVE:  2nd - no papilledema on fundoscopic exam 2nd, 3rd, 4th, 6th - pupils equal and reactive to light, visual fields full to confrontation, extraocular muscles intact, no nystagmus 5th - facial sensation symmetric 7th - facial strength symmetric 8th - hearing intact 9th - palate elevates symmetrically, uvula midline 11th - shoulder shrug symmetric 12th - tongue protrusion midline  MOTOR:  normal bulk and tone, full strength in the BUE, BLE  SENSORY:  normal and symmetric to light touch, temperature, vibration  COORDINATION:  finger-nose-finger, fine finger movements normal  REFLEXES:  deep tendon reflexes TRACE and symmetric  GAIT/STATION:  narrow based gait; ANTALGIC GAIT; LIMPING ON RIGHT KNEE     DIAGNOSTIC DATA (LABS, IMAGING, TESTING) - I reviewed patient records, labs, notes, testing and imaging myself where available.  Lab Results  Component Value Date   WBC 8.2 12/18/2022   HGB 13.6 12/18/2022   HCT 40.0 12/18/2022   MCV 91.3 12/18/2022   PLT 135 (L) 12/18/2022      Component Value Date/Time   NA 142 12/18/2022 1542   NA 142 02/02/2021 1117   NA 139 07/28/2012 1858   K 4.0 12/18/2022 1542   K 3.7 07/28/2012 1858   CL 102 12/18/2022 1542   CL 107 07/28/2012 1858   CO2 27 12/18/2022 1522   CO2 27 07/28/2012 1858   GLUCOSE 107 (H) 12/18/2022 1542   GLUCOSE 114 (H) 07/28/2012 1858   BUN 20 12/18/2022 1542   BUN 19 02/02/2021 1117   BUN 16 07/28/2012 1858   CREATININE 1.20 12/18/2022 1542   CREATININE 1.27 07/28/2012 1858   CALCIUM 9.0  12/18/2022 1522   CALCIUM 8.9 07/28/2012 1858   PROT 6.5 12/18/2022 1522   ALBUMIN 4.3 12/18/2022 1522   AST 25 12/18/2022 1522   ALT 17 12/18/2022 1522   ALKPHOS 59 12/18/2022 1522   BILITOT 0.6 12/18/2022 1522   GFRNONAA >60 12/18/2022 1522   GFRNONAA 56 (L) 07/28/2012 1858   GFRAA >60 02/08/2015 1128   GFRAA >60 07/28/2012 1858   Lab Results  Component Value Date   CHOL 132 07/20/2022   HDL 44.20 07/20/2022   LDLCALC 54 07/20/2022   LDLDIRECT 76.0 10/20/2020   TRIG 169.0 (H) 07/20/2022   CHOLHDL 3 07/20/2022   Lab Results  Component Value Date   HGBA1C 5.4 04/19/2022   Lab Results  Component Value Date   VITAMINB12 578 11/16/2021   Lab Results  Component Value Date   TSH 0.86 12/06/2009   01/17/15 CTA head / head 1. 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 No evidence of hemorrhagic conversion or progressive left cerebellar infarction since brain MRI yesterday; the left PICA is open proximally. 2. Changes of remote left ICA dissection with effective occlusion at the petrous cavernous junction. Left A1 segment is hypoplastic but there is large and patent left posterior communicating artery. 3. Atherosclerosis without flow limiting stenosis. 4. Emphysema.  01/16/15 MRI brain [I reviewed images myself  and agree with interpretation. -VRP]  1. Acute nonhemorrhagic 12 mm infarct within the inferior left cerebellum. 2. Second punctate infarct also in the left cerebellum. 3. Abnormal signal in left vertebral artery suggesting slow or occluded flow. 4. Question abnormal signal in the precavernous left internal carotid artery, also suggesting slow flow. 5. Mild subcortical white matter changes, slightly exaggerated for age.  02/08/15 MRI brain [I reviewed images myself and agree with interpretation. -VRP]  Expected evolution of small left PICA cerebellar infarct since 01/16/2015. No new intracranial  abnormality.  12/18/22 MRI brain  1. No acute intracranial abnormality. 2. Multiple old cerebellar small vessel infarcts and findings of chronic small vessel ischemia.   ASSESSMENT AND PLAN  82 y.o. year old male here with    Dx:  1. TIA (transient ischemic attack)      PLAN:  ABNORMAL SPELLS (12/14/22 x 15 minutes confusion while driving; 3/0/86 x 15 minutes confusion, blurred vision, vertigo in bed)  - continue aspirin 81mg , rosuvastatin, valsartan  - check CTA head / neck  - check EEG  - According to [place] law, you can not drive unless you are free from abnormal spells of confusion for at least 6 months and under physician's care.   - Please maintain precautions. Do not participate in activities where a loss of awareness could harm you or someone else. No swimming alone, no tub bathing, no hot tubs, no driving, no operating motorized vehicles (cars, ATVs, motocycles, etc), lawnmowers, power tools or firearms. No standing at heights, such as rooftops, ladders or stairs. Avoid hot objects such as stoves, heaters, open fires. Wear a helmet when riding a bicycle, scooter, skateboard, etc. and avoid areas of traffic. Set your water heater to 120 degrees or less.    HISTORY OF STROKE (2016) - continue aspirin, statin, BP control   Orders Placed This Encounter  Procedures   CT ANGIO HEAD W OR WO CONTRAST   CT ANGIO NECK W OR WO CONTRAST   Return for pending if symptoms worsen or fail to improve, pending test results.  I reviewed images, labs, notes, records myself. I summarized findings and reviewed with patient, for this high risk condition (abnormal spell; confusion; seizure vs TIA) requiring high complexity decision making.    Suanne Marker, MD 12/21/2022, 10:41 AM Certified in Neurology, Neurophysiology and Neuroimaging  [facility] [address], [address] [place], [place] [address] [phone]

## 2022-12-21 NOTE — Patient Instructions (Addendum)
  ABNORMAL SPELLS (12/14/22 x 15 minutes confusion while driving; 4/0/98 x 15 minutes confusion, blurred vision, vertigo in bed)  - continue aspirin 81mg , rosuvastatin, valsartan  - check CTA head / neck  - check EEG  - According to [place] law, you can not drive unless you are free from abnormal spells of confusion for at least 6 months and under physician's care.   - Please maintain precautions. Do not participate in activities where a loss of awareness could harm you or someone else. No swimming alone, no tub bathing, no hot tubs, no driving, no operating motorized vehicles (cars, ATVs, motocycles, etc), lawnmowers, power tools or firearms. No standing at heights, such as rooftops, ladders or stairs. Avoid hot objects such as stoves, heaters, open fires. Wear a helmet when riding a bicycle, scooter, skateboard, etc. and avoid areas of traffic. Set your water heater to 120 degrees or less.    HISTORY OF STROKE (2016) - continue aspirin, statin, BP control

## 2022-12-31 ENCOUNTER — Ambulatory Visit (INDEPENDENT_AMBULATORY_CARE_PROVIDER_SITE_OTHER): Payer: HMO | Admitting: Diagnostic Neuroimaging

## 2022-12-31 DIAGNOSIS — R4182 Altered mental status, unspecified: Secondary | ICD-10-CM

## 2022-12-31 DIAGNOSIS — Z8673 Personal history of transient ischemic attack (TIA), and cerebral infarction without residual deficits: Secondary | ICD-10-CM

## 2023-01-07 ENCOUNTER — Ambulatory Visit
Admission: RE | Admit: 2023-01-07 | Discharge: 2023-01-07 | Disposition: A | Discharge status: Home / Self Care | Payer: HMO | Source: Ambulatory Visit | Attending: Diagnostic Neuroimaging | Admitting: Diagnostic Neuroimaging

## 2023-01-07 ENCOUNTER — Other Ambulatory Visit: Payer: Self-pay | Admitting: Diagnostic Neuroimaging

## 2023-01-07 DIAGNOSIS — I6502 Occlusion and stenosis of left vertebral artery: Secondary | ICD-10-CM | POA: Diagnosis not present

## 2023-01-07 DIAGNOSIS — G459 Transient cerebral ischemic attack, unspecified: Secondary | ICD-10-CM

## 2023-01-07 DIAGNOSIS — Z8673 Personal history of transient ischemic attack (TIA), and cerebral infarction without residual deficits: Secondary | ICD-10-CM | POA: Diagnosis not present

## 2023-01-07 DIAGNOSIS — I6522 Occlusion and stenosis of left carotid artery: Secondary | ICD-10-CM | POA: Diagnosis not present

## 2023-01-07 MED ORDER — IOPAMIDOL (ISOVUE-370) INJECTION 76%
100.0000 mL | Freq: Once | INTRAVENOUS | Status: AC | PRN
  Administered 2023-01-07: 75 mL via INTRAVENOUS

## 2023-01-14 ENCOUNTER — Telehealth: Payer: Self-pay | Admitting: Diagnostic Neuroimaging

## 2023-01-14 NOTE — Telephone Encounter (Signed)
Pt is asking to be called with results to EEG and CT with contrast

## 2023-01-14 NOTE — Telephone Encounter (Signed)
Please result when available  

## 2023-01-22 NOTE — Procedures (Signed)
   [facility]  EEG (ELECTROENCEPHALOGRAM) REPORT   STUDY DATE: 12/31/22 PATIENT NAME: Cody Willis DOB: 02-Jun-1940 MRN: [id number]  ORDERING CLINICIAN: Joycelyn Schmid, MD   TECHNOLOGIST: Marcheta Grammes TECHNIQUE: Electroencephalogram was recorded utilizing standard 10-20 system of lead placement and reformatted into average and bipolar montages.  RECORDING TIME: 25 minutes ACTIVATION: hyperventilation and photic stimulation  CLINICAL INFORMATION: 82 year old male with abnormal spells.  FINDINGS: Posterior dominant background rhythms, which attenuate with eye opening, ranging 8-9 hertz and 10-15 microvolts. No focal, lateralizing, epileptiform activity or seizures are seen. Patient recorded in the awake and drowsy state.    IMPRESSION:   Normal EEG in the awake and drowsy states.     INTERPRETING PHYSICIAN:  Suanne Marker, MD Certified in Neurology, Neurophysiology and Neuroimaging  [facility] [address], [address] [place], [place] [address] [phone]

## 2023-01-25 ENCOUNTER — Ambulatory Visit (INDEPENDENT_AMBULATORY_CARE_PROVIDER_SITE_OTHER): Payer: HMO | Admitting: Family Medicine

## 2023-01-25 ENCOUNTER — Encounter: Payer: Self-pay | Admitting: Family Medicine

## 2023-01-25 VITALS — BP 120/76 | HR 73 | Temp 98.1°F | Ht 66.0 in | Wt 172.4 lb

## 2023-01-25 DIAGNOSIS — J449 Chronic obstructive pulmonary disease, unspecified: Secondary | ICD-10-CM

## 2023-01-25 DIAGNOSIS — I10 Essential (primary) hypertension: Secondary | ICD-10-CM

## 2023-01-25 DIAGNOSIS — Z8673 Personal history of transient ischemic attack (TIA), and cerebral infarction without residual deficits: Secondary | ICD-10-CM

## 2023-01-25 MED ORDER — VALSARTAN 80 MG PO TABS
80.0000 mg | ORAL_TABLET | Freq: Every day | ORAL | 1 refills | Status: DC
Start: 2023-01-25 — End: 2023-04-28

## 2023-01-25 MED ORDER — UMECLIDINIUM-VILANTEROL 62.5-25 MCG/ACT IN AEPB
1.0000 | INHALATION_SPRAY | Freq: Every day | RESPIRATORY_TRACT | 1 refills | Status: DC
Start: 2023-01-25 — End: 2023-02-05

## 2023-01-25 MED ORDER — ROSUVASTATIN CALCIUM 20 MG PO TABS
20.0000 mg | ORAL_TABLET | Freq: Every day | ORAL | 1 refills | Status: DC
Start: 2023-01-25 — End: 2023-03-11

## 2023-01-25 NOTE — Patient Instructions (Signed)
Nice to see you. We are going to try Anoro for your COPD.  If this is not affordable please let me know.

## 2023-01-25 NOTE — Assessment & Plan Note (Signed)
Patient with possible TIA previously.  He has seen neurology.  He is awaiting his CT angiogram head and neck.  Discussed that they would contact him when this returned.  Advise that we are short on radiologist at this time and are having difficulty getting imaging results back in a timely manner.

## 2023-01-25 NOTE — Progress Notes (Signed)
Marikay Alar, MD Phone: [phone]  Cody Willis is a 82 y.o. male who presents today for f/u.  TIA: Patient saw neurology after having episode of confusion.  He had an EEG which was negative.  They ordered a CT angiogram head and neck that has not resulted yet.  He has not had any recurrence of this issue.  Hypertension: Typically 130s over 70s.  Heart.  No chest pain or shortness of  COPD: Patient has been using his albuterol twice daily.  He notes this keeps the wheezing down.  He saw pulmonology in 2022.  They placed him on Spiriva and patient notes no benefit from this.  He notes the cost was prohibitive as well.  Social History   Tobacco Use  Smoking Status Former   Types: Cigarettes  Smokeless Tobacco Never    Current Outpatient Medications on File Prior to Visit  Medication Sig Dispense Refill   acetaminophen (TYLENOL) 500 MG tablet Take 2 tablets (1,000 mg total) by mouth every 6 (six) hours as needed for mild pain or moderate pain. 60 tablet 0   albuterol (VENTOLIN HFA) 108 (90 Base) MCG/ACT inhaler Inhale 1-2 puffs into the lungs every 4 (four) hours as needed for wheezing or shortness of breath. 2 each 1   aspirin EC 81 MG tablet Take 1 tablet (81 mg total) by mouth 2 (two) times daily. To prevent blood clots for 30 days after surgery. (Patient taking differently: Take 81 mg by mouth daily. To prevent blood clots for 30 days after surgery.) 60 tablet 0   cyanocobalamin (VITAMIN B12) 1000 MCG/ML injection INJECT INTO THE MUSCLE EVERY 30 DAYS. PHARMACY-PLEASE PROVIDE APPROPRIATE NEEDLES AND SYRINGES 3 mL 3   fluorouracil (EFUDEX) 5 % cream Apply to forehead and temples once daily x 3 weeks. 40 g 0   ketoconazole (NIZORAL) 2 % shampoo use 2 - 3 times weekly, apply topically to scalp , neck and chest, leave on skin for at least 5 minutes before rinsing off. 120 mL 6   Pancrelipase, Lip-Prot-Amyl, ([name]) 25000-79000 units CPEP Take 2 capsules by mouth with  breakfast, with lunch, and with evening meal. 540 capsule 3   pantoprazole (PROTONIX) 40 MG tablet Take 1 tablet (40 mg total) by mouth every morning. 90 tablet 0   triamcinolone (KENALOG) 0.025 % ointment Apply 1 Application topically daily as needed. 80 g 0   No current facility-administered medications on file prior to visit.     ROS see history of present illness  Objective  Physical Exam Vitals:   01/25/23 1017  BP: 120/76  Pulse: 73  Temp: 98.1 F (36.7 C)  SpO2: 96%    BP Readings from Last 3 Encounters:  01/25/23 120/76  12/21/22 139/65  12/19/22 (!) 144/58   Wt Readings from Last 3 Encounters:  01/25/23 172 lb 6.4 oz (78.2 kg)  12/21/22 171 lb (77.6 kg)  12/18/22 166 lb (75.3 kg)    Physical Exam Constitutional:      General: He is not in acute distress.    Appearance: He is not diaphoretic.  Cardiovascular:     Rate and Rhythm: Normal rate and regular rhythm.     Heart sounds: Normal heart sounds.  Pulmonary:     Effort: Pulmonary effort is normal.     Breath sounds: Normal breath sounds.  Skin:    General: Skin is warm and dry.  Neurological:     Mental Status: He is alert.      Assessment/Plan: Please  see individual problem list.  Chronic obstructive pulmonary disease, unspecified COPD type (HCC) Assessment & Plan: Chronic issue.  Suboptimally controlled.  We will send in Anoro 1 inhalation once daily.  Patient will let us know if this is not affordable.  Orders: -     Umeclidinium-Vilanterol; Inhale 1 puff into the lungs daily at 6 (six) AM.  Dispense: 60 each; Refill: 1  Primary hypertension Assessment & Plan: Chronic issue.  Blood pressure is adequately controlled.  Patient will continue valsartan 80 mg daily.  Orders: -     Valsartan; Take 1 tablet (80 mg total) by mouth daily.  Dispense: 90 tablet; Refill: 1  History of CVA (cerebrovascular accident) Assessment & Plan: Patient with possible TIA previously.  He has seen neurology.   He is awaiting his CT angiogram head and neck.  Discussed that they would contact him when this returned.  Advise that we are short on radiologist at this time and are having difficulty getting imaging results back in a timely manner.  Orders: -     Rosuvastatin Calcium; Take 1 tablet (20 mg total) by mouth daily. Please call [phone] to schedule an overdue appointment with Dr. Donato Schultz for future refills. Thank you. 2nd attempt.  Dispense: 90 tablet; Refill: 1    Return in about 3 months (around 04/27/2023) for COPD.   Marikay Alar, MD [facility]

## 2023-01-25 NOTE — Assessment & Plan Note (Signed)
Chronic issue.  Suboptimally controlled.  We will send in Anoro 1 inhalation once daily.  Patient will let us know if this is not affordable.

## 2023-01-25 NOTE — Assessment & Plan Note (Signed)
Chronic issue.  Blood pressure is adequately controlled.  Patient will continue valsartan 80 mg daily.

## 2023-01-30 ENCOUNTER — Telehealth: Payer: Self-pay | Admitting: Family Medicine

## 2023-01-30 NOTE — Telephone Encounter (Signed)
Patient just called and he wanted to let Dr. Birdie Sons know the medication he was prescribed to get umeclidinium-vilanterol (ANORO ELLIPTA) 62.5-25. He said it cost too much. The price for copay is 114.00 dollars. He said he is not going to get it. He wanted to let provider know.

## 2023-01-30 NOTE — Telephone Encounter (Signed)
Noted. I can try to send in a different medication or have him see our pharmacist to get her assistance on other options that could be cheaper.

## 2023-01-31 NOTE — Telephone Encounter (Signed)
Sent Patient a mychart message.

## 2023-02-05 MED ORDER — STIOLTO RESPIMAT 2.5-2.5 MCG/ACT IN AERS: 2.0000 | INHALATION_SPRAY | Freq: Every day | RESPIRATORY_TRACT | 2 refills | Status: DC

## 2023-02-05 NOTE — Telephone Encounter (Signed)
Stiolto sent to pharmacy.

## 2023-02-05 NOTE — Telephone Encounter (Signed)
Noted  

## 2023-02-05 NOTE — Telephone Encounter (Signed)
Patient states you can call another medication in but if the price is going to be expensive like the Anoro Ellipta then he will just stay with the Albuterol.

## 2023-02-05 NOTE — Telephone Encounter (Signed)
Left message with a family member to give the office a call back.

## 2023-02-07 NOTE — Telephone Encounter (Signed)
Pt returned call and I was able to review the results with him. Pt verbalized understanding. I did send a copy of the results to the pt's PCP

## 2023-02-07 NOTE — Telephone Encounter (Signed)
Called the patient to review results. There was no answer. LVM asking for the pt to call back and informed I would also send a mychart message.

## 2023-02-07 NOTE — Telephone Encounter (Signed)
Multiple areas of narrow blood vessels. Recommend aggressive medical mgmt. Aspirin, statin, BP control. Follow up with PCP. -VRP

## 2023-02-19 ENCOUNTER — Ambulatory Visit (INDEPENDENT_AMBULATORY_CARE_PROVIDER_SITE_OTHER): Payer: HMO | Admitting: Nurse Practitioner

## 2023-02-19 ENCOUNTER — Encounter: Payer: Self-pay | Admitting: Nurse Practitioner

## 2023-02-19 VITALS — BP 128/68 | HR 60 | Ht 66.0 in | Wt 171.5 lb

## 2023-02-19 DIAGNOSIS — K219 Gastro-esophageal reflux disease without esophagitis: Secondary | ICD-10-CM | POA: Diagnosis not present

## 2023-02-19 DIAGNOSIS — K8689 Other specified diseases of pancreas: Secondary | ICD-10-CM

## 2023-02-19 MED ORDER — ZENPEP 25000-79000 UNITS PO CPEP: 2.0000 | ORAL_CAPSULE | Freq: Three times a day (TID) | ORAL | 3 refills | Status: DC

## 2023-02-19 MED ORDER — PANTOPRAZOLE SODIUM 40 MG PO TBEC: 40.0000 mg | DELAYED_RELEASE_TABLET | Freq: Every morning | ORAL | 3 refills | Status: DC

## 2023-02-19 MED ORDER — CYANOCOBALAMIN 1000 MCG/ML IJ SOLN: INTRAMUSCULAR | 3 refills | Status: DC

## 2023-02-19 NOTE — Progress Notes (Signed)
02/19/2023 ATO CRONEN [id number] Mar 08, 1941   Chief Complaint:  History of Present Illness: Cody Willis. Cody Willis is an 82 year old male with a past medical history of arthritis, malignant melanoma, carotid artery disease, CVA 2016, TIA 12/2022, COPD, chronic idiopathic pancreatitis on [name], GERD and gastric hyperplastic polyps. He is followed by Dr. Rhea Belton. I last saw Cody Willis in office 11/16/2021, at that time he had recurrent dysphagia and RUQ pain. He underwent an EGD 12/08/2022 which showed no endoscopic esophageal abnormality to explain his dysphagia and his esophagus was dilated with a 50 [place] Maloney and a very small hiatal hernia was noted. Stomach biopsies were negative for H. pylori. His difficulty swallowing and RUQ pain abated shortly after his EGD procedure without recurrence. Currently, he reports feeling quite well. 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 He denies having any heartburn or dysphagia. No upper or lower abdominal pain. No nausea or vomiting. He is passing a normal formed brown stool once daily. If he skips Zenpepp, he gets a loose stool. No bloody or black stools.  His last colonoscopy was 07/2019 which showed diverticulosis in the sigmoid and cecum and internal hemorrhoids.     Latest Ref Rng & Units 12/18/2022    3:42 PM 12/18/2022    3:22 PM 04/19/2022    2:36 PM  CBC  WBC 4.0 - 10.5 K/uL  8.2  10.4   Hemoglobin 13.0 - 17.0 g/dL 21.3  08.6  57.8   Hematocrit 39.0 - 52.0 % 40.0  39.8  44.3   Platelets 150 - 400 K/uL  135  187        Latest Ref Rng & Units 12/18/2022    3:42 PM 12/18/2022    3:22 PM 07/20/2022   10:21 AM  CMP  Glucose 70 - 99 mg/dL 469  629  83   BUN 8 - 23 mg/dL 20  18  22    Creatinine 0.61 - 1.24 mg/dL 5.28  4.13  2.44   Sodium 135 - 145 mmol/L 142  140  141   Potassium 3.5 - 5.1 mmol/L 4.0  3.8  4.4   Chloride 98 - 111 mmol/L 102  103  104   CO2 22 - 32 mmol/L  27  32   Calcium 8.9 - 10.3 mg/dL  9.0   9.5   Total Protein 6.5 - 8.1 g/dL  6.5  6.6   Total Bilirubin 0.3 - 1.2 mg/dL  0.6  0.8   Alkaline Phos 38 - 126 U/L  59  62   AST 15 - 41 U/L  25  18   ALT 0 - 44 U/L  17  13     CT angio head/neck 01/29/2023: IMPRESSION: 1. High-grade stenosis of the distal petrous left internal carotid artery with cavernous transformation just beyond the stenosis. A hypoplastic cervical left ICA may be secondary to this stenosis. 2. Moderate stenosis of the left ophthalmic ICA. 3. Mild irregularity of the cavernous right internal carotid artery without focal stenosis. 4. 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 5. High-grade tandem stenosis of the proximal left V4 segment just proximal to the PICA. 6. No other significant proximal stenosis, aneurysm, or branch vessel occlusion within the Circle of Willis. 7. Remote lacunar infarct of the left cerebellum. 8. Multilevel spondylosis of the cervical spine.  PAST GI PROCEDURES:   EGD 12/07/2021: - No endoscopic esophageal abnormality to explain patient's dysphagia. Esophagus dilated  with 52 Fr. Maloney.  - Very small, 1 cm, hiatal hernia.  - Granular gastric mucosa. Biopsied.  - Normal examined duodenum. - ANTRAL AND OXYNTIC MUCOSA WITH NO SIGNIFICANT PATHOLOGY. - NO HELICOBACTER PYLORI ORGANISMS IDENTIFIED ON H&E STAINED SLIDE.   Colonoscopy 07/31/2019: Diverticulosis in the sigmoid colon and in the cecum. - Small internal hemorrhoids. - No specimens collected.   EGD 11/26/2016: - 2 cm hiatal hernia. - Stable, gastric polypoid mucosa in the antrum. Multiple biopsies. - A single, stable, duodenal polyp. Multiple biopsies. 1. Surgical [P], duodenal bulb polyp - GASTRIC-TYPE MUCOSA WITH MILD INFLAMMATION AND REACTIVE CHANGES. - NO DYSPLASIA OR MALIGNANCY. 2. Surgical [P], antral polyp - HYPERPLASTIC GASTRIC POLYP WITH INFLAMMATION AND REACTIVE CHANGES. - WARTHIN-STARRY STAIN NEGATIVE FOR  HELICOBACTER PYLORI. - NO ADENOMATOUS CHANGE, INTESTINAL METAPLASIA, DYSPLASIA OR MALIGNANCY.   EGD 10/06/2015: - LA Grade A esophagitis. - No endoscopic esophageal abnormality to explain patient's dysphagia. Esophagus dilated to 17 mm with Savary over a guidewire. - A single gastric polypoid lesion. Biopsied. - A single duodenal polyp. Biopsied.  Past Medical History:  Diagnosis Date   Actinic keratosis    Arthritis    fingers   Basal cell carcinoma 02/10/2008   left top of shoulder   Basal cell carcinoma 08/14/2022   superficial, right upper temple, [facility] 09/12/22   Basal cell carcinoma 11/14/2022   left upper forehead, [facility]   BCC (basal cell carcinoma) 10/21/2003   superficial spinal lower back   Carpal tunnel syndrome    Cataract    bilateral surgery repair   Chronic pancreatitis (HCC)    COPD (chronic obstructive pulmonary disease) (HCC)    COVID-19    Esophageal reflux    Gastric polyps    hyperplastic   Herpes zoster    Hiatal hernia    Hypertension    Melanoma (HCC) 10/21/2003   right mid abdomen Clarks Level II Breslows Measurement 0.42 mm   Other B-complex deficiencies    Pneumonia    Pure hypercholesterolemia    Stroke (HCC) 01/16/2015   only on ASA   Past Surgical History:  Procedure Laterality Date   APPENDECTOMY     BACK SURGERY     Diskectomy between L4 and L5   CARPAL TUNNEL RELEASE Left 06/27/2021   Procedure: CARPAL TUNNEL RELEASE LEFT;  Surgeon: Cindee Salt, MD;  Location: [facility];  Service: Orthopedics;  Laterality: Left;  Regional with monitored anesthesia care   CATARACT EXTRACTION W/ INTRAOCULAR LENS  IMPLANT, BILATERAL  12/15 and 2/16   CHOLECYSTECTOMY     COLONOSCOPY  2011   MELANOMA EXCISION  ~2009   right upper abdomen   PARTIAL KNEE ARTHROPLASTY Right 05/01/2022   Procedure: UNICOMPARTMENTAL KNEE;  Surgeon: Sheral Apley, MD;  Location: [facility];  Service: Orthopedics;  Laterality: Right;   Current Medications,  Allergies, Past Medical History, Past Surgical History, Family History and Social History were reviewed in [employer] record.  Review of Systems:   Constitutional: Negative for fever, sweats, chills or weight loss.  Respiratory: Negative for shortness of breath.   Cardiovascular: Negative for chest pain, palpitations and leg swelling.  Gastrointestinal: See HPI.  Musculoskeletal: Negative for back pain or muscle aches.  Neurological: Negative for dizziness, headaches or paresthesias.   Physical Exam: BP 128/68   Pulse 60   Ht 5\' 6"  (1.676 m)   Wt 171 lb 8 oz (77.8 kg)   BMI 27.68 kg/m  General: 82 year old male in no  acute distress. Head: Normocephalic and atraumatic. Eyes: No scleral icterus. Conjunctiva pink . Ears: Normal auditory acuity. Mouth: Upper and lower denture intact. No ulcers or lesions.  Lungs: Clear throughout to auscultation. Heart: Regular rate and rhythm, no murmur. Abdomen: Soft, nontender and nondistended. No masses or hepatomegaly. Normal bowel sounds x 4 quadrants.  Rectal: Deferred.  Musculoskeletal: Symmetrical with no gross deformities. Extremities: No edema. Neurological: Alert oriented x 4. No focal deficits.  Psychological: Alert and cooperative. Normal mood and affect  Assessment and Recommendations:  82 year old male with a history of GERD and recurrent dysphagia. No further dysphagia since he underwent an EGD 11/2021 which showed a normal esophagus which was empirically dilated and a 1 cm hiatal hernia. -Continue Pantoprazole 40 mg 1 p.o. daily to be taken 30 minutes before breakfast #90 with 3 additional refills -GERD diet -Patient to contact office if dysphagia recurs -Recommended vitamin D level at time of next follow-up appoint with PCP 04/2024   RUQ pain, abated  -Recommend CTAP if abdominal pain recurs   Chronic idiopathic pancreatitis on two [name] TID.  -Refill [name] 25,000-79,000 units 2 p.o. 3 times daily #540  with 3 additional refills, samples also provided.    Vitamin B12 deficiency.  Patiently does self injections monthly.  -Refill vitamin B-12 1 vial monthly with 11 refills -Recommend B12 level at time of next follow-up appointment with PCP 04/2024   Colorectal screening, last colonoscopy 07/31/2019 without polyps.  No further screening colonoscopies due to age.  Patient instructed to follow-up in the office in 1 year and as needed

## 2023-02-19 NOTE — Patient Instructions (Addendum)
We have sent the following medications to your pharmacy for you to pick up at your convenience: [name]. Pantoprazole & Vitamin B12   Follow up in 1 year.  Due to recent changes in healthcare laws, you may see the results of your imaging and laboratory studies on MyChart before your provider has had a chance to review them.  We understand that in some cases there may be results that are confusing or concerning to you. Not all laboratory results come back in the same time frame and the provider may be waiting for multiple results in order to interpret others.  Please give [place] 48 hours in order for your provider to thoroughly review all the results before contacting the office for clarification of your results.   Thank you for trusting me with your gastrointestinal care!   Alcide Evener, CRNP

## 2023-02-19 NOTE — Progress Notes (Signed)
Addendum: Reviewed and agree with assessment and management plan. Kadijah Shamoon M, MD  

## 2023-03-11 ENCOUNTER — Ambulatory Visit: Payer: HMO | Attending: Cardiology | Admitting: Cardiology

## 2023-03-11 ENCOUNTER — Encounter: Payer: Self-pay | Admitting: Cardiology

## 2023-03-11 VITALS — BP 132/72 | HR 64 | Ht 66.0 in | Wt 169.0 lb

## 2023-03-11 DIAGNOSIS — I10 Essential (primary) hypertension: Secondary | ICD-10-CM

## 2023-03-11 DIAGNOSIS — I2510 Atherosclerotic heart disease of native coronary artery without angina pectoris: Secondary | ICD-10-CM | POA: Diagnosis not present

## 2023-03-11 DIAGNOSIS — E782 Mixed hyperlipidemia: Secondary | ICD-10-CM

## 2023-03-11 MED ORDER — ROSUVASTATIN CALCIUM 20 MG PO TABS: 20.0000 mg | ORAL_TABLET | Freq: Every day | ORAL | 3 refills | Status: AC

## 2023-03-11 NOTE — Patient Instructions (Signed)

## 2023-03-11 NOTE — Progress Notes (Signed)
Cardiology Office Note:  .   Date:  03/11/2023  ID:  Cody Willis, DOB May 09, 1940, MRN [id number] PCP: Glori Luis, MD  [facility] Providers Cardiologist:  Donato Schultz, MD     History of Present Illness: .   Cody Willis is a 82 y.o. male Discussed with the use of AI scribe   History of Present Illness   The patient, with a history of nonobstructive coronary artery disease, hyperlipidemia, and a prior stroke, presents for a follow-up visit. His coronary CT scan from 02/10/2021 showed a calcium score of 253 (42nd percentile), and an echocardiogram from 02/24/2021 revealed an ejection fraction of 65%. He is currently on Crestor 20mg  for hyperlipidemia management, with an LDL of 54 as of 07/20/2022. His Hemoglobin A1c is 5.4, Hemoglobin 13.6, and Creatinine 1.2. He also has a history of mild right carotid artery plaque and a small caliber left carotid artery.  The patient's younger brother, who also had coronary artery disease, passed away a few years ago. The patient quit smoking in 2016 and has COPD, for which he uses a puffer one or two times a day. He reports no chest problems, pain, or shortness of breath.  The patient remains active, recently splitting and stacking eight 17-foot logs. He expresses satisfaction with his current health status. He is also on low-dose aspirin and valsartan 80mg  for blood pressure management. He reports no issues with these medications.          ROS: No CP, no SOB  Studies Reviewed: .        Results LABS LDL: 54 (07/20/2022) Hemoglobin A1c: 5.4 Hemoglobin: 13.6 Creatinine: 1.2  RADIOLOGY Coronary CT scan: Nonobstructive coronary artery disease (02/10/2021) Calcium score: 253, 42nd percentile (02/10/2021) Carotid ultrasound: Mild right carotid artery plaque, left carotid small in caliber  DIAGNOSTIC Echocardiogram: Ejection fraction 65% (02/24/2021)  Risk Assessment/Calculations:            Physical Exam:   VS:  BP  132/72   Pulse 64   Ht 5\' 6"  (1.676 m)   Wt 169 lb (76.7 kg)   SpO2 95%   BMI 27.28 kg/m    Wt Readings from Last 3 Encounters:  03/11/23 169 lb (76.7 kg)  02/19/23 171 lb 8 oz (77.8 kg)  01/25/23 172 lb 6.4 oz (78.2 kg)    GEN: Well nourished, well developed in no acute distress NECK: No JVD; No carotid bruits CARDIAC: RRR, no murmurs, no rubs, no gallops RESPIRATORY:  Clear to auscultation without rales, wheezing or rhonchi  ABDOMEN: Soft, non-tender, non-distended EXTREMITIES:  No edema; No deformity   ASSESSMENT AND PLAN: .    Assessment and Plan    Coronary Artery Disease (Nonobstructive) Follow-up for nonobstructive coronary artery disease. Coronary CT scan on 02/10/2021 showed nonobstructive disease with a calcium score of 253 (40th percentile). Echocardiogram on 02/24/2021 showed an ejection fraction of 65%. Asymptomatic with no chest pain or shortness of breath. Continues to stay active and reports good overall health. Discussed the importance of continued medication adherence and regular follow-ups to monitor disease progression and prevent complications. - Continue Crestor 20 mg daily - Continue low-dose aspirin - Follow-up in one year  Hyperlipidemia Hyperlipidemia managed with Crestor 20 mg. LDL was 54 on 07/20/2022. Current management appears effective. Discussed the benefits of maintaining LDL levels below 70 mg/dL to reduce the risk of cardiovascular events. - Continue Crestor 20 mg daily  Hypertension Hypertension managed with valsartan 80 mg. Blood pressure control appears adequate  with current regimen. Discussed the importance of blood pressure control in reducing the risk of stroke and heart attack. - Continue valsartan 80 mg daily  Stroke History of stroke, currently on low-dose aspirin for secondary prevention. No new neurological symptoms reported. Discussed the role of aspirin in reducing the risk of recurrent stroke. - Continue low-dose  aspirin  Chronic Obstructive Pulmonary Disease (COPD) COPD managed with inhaler use one to two times daily. Quit smoking in 2016. No recent exacerbations reported. Emphasized the importance of continued inhaler use and regular monitoring to prevent exacerbations. - Continue current inhaler regimen  General Health Maintenance Remains active and engages in regular physical activity. Reports good overall health and is proactive in managing his health through regular follow-ups and medication adherence. Discussed the importance of routine blood work including B and D vitamins due to Protonix use. - Encourage continued physical activity - Follow-up with primary care for routine blood work including B and D vitamins due to Protonix use suggested by GI  Follow-up - Schedule follow-up appointment in one year.              Signed, Donato Schultz, MD

## 2023-03-12 ENCOUNTER — Ambulatory Visit (INDEPENDENT_AMBULATORY_CARE_PROVIDER_SITE_OTHER): Payer: HMO | Admitting: *Deleted

## 2023-03-12 VITALS — Ht 66.0 in | Wt 169.0 lb

## 2023-03-12 DIAGNOSIS — Z0000 Encounter for general adult medical examination without abnormal findings: Secondary | ICD-10-CM | POA: Diagnosis not present

## 2023-03-12 NOTE — Progress Notes (Signed)
Subjective:   Cody Willis is a 82 y.o. male who presents for Medicare Annual/Subsequent preventive examination.  Visit Complete: Virtual I connected with  Cody Willis on 03/12/23 by a audio enabled telemedicine application and verified that I am speaking with the correct person using two identifiers.  Patient Location: Home  Provider Location: Office/Clinic  I discussed the limitations of evaluation and management by telemedicine. The patient expressed understanding and agreed to proceed.  Vital Signs: Because this visit was a virtual/telehealth visit, some criteria may be missing or patient reported. Any vitals not documented were not able to be obtained and vitals that have been documented are patient reported.  Patient Medicare AWV questionnaire was completed by the patient on 03/08/23; I have confirmed that all information answered by patient is correct and no changes since this date.  Cardiac Risk Factors include: advanced age (>72men, >48 women);male gender;hypertension;dyslipidemia;Other (see comment), Risk factor comments: CAD     Objective:    Today's Vitals   03/12/23 0955  Weight: 169 lb (76.7 kg)  Height: 5\' 6"  (1.676 m)   Body mass index is 27.28 kg/m.     03/12/2023   10:08 AM 12/18/2022    1:43 PM 05/01/2022    8:04 AM 04/19/2022    2:19 PM 03/06/2022    1:33 PM 06/27/2021    7:48 AM 06/16/2021   12:21 PM  Advanced Directives  Does Patient Have a Medical Advance Directive? No No No No No No No  Would patient like information on creating a medical advance directive? No - Patient declined  No - Patient declined No - Patient declined No - Patient declined No - Patient declined No - Patient declined    Current Medications (verified) Outpatient Encounter Medications as of 03/12/2023  Medication Sig   acetaminophen (TYLENOL) 500 MG tablet Take 2 tablets (1,000 mg total) by mouth every 6 (six) hours as needed for mild pain or moderate pain.   albuterol  (VENTOLIN HFA) 108 (90 Base) MCG/ACT inhaler Inhale 1-2 puffs into the lungs every 4 (four) hours as needed for wheezing or shortness of breath.   aspirin EC 81 MG tablet Take 1 tablet (81 mg total) by mouth 2 (two) times daily. To prevent blood clots for 30 days after surgery. (Patient taking differently: Take 81 mg by mouth daily. To prevent blood clots for 30 days after surgery.)   cyanocobalamin (VITAMIN B12) 1000 MCG/ML injection INJECT INTO THE MUSCLE EVERY 30 DAYS. PHARMACY-PLEASE PROVIDE APPROPRIATE NEEDLES AND SYRINGES   fluorouracil (EFUDEX) 5 % cream Apply to forehead and temples once daily x 3 weeks.   ketoconazole (NIZORAL) 2 % shampoo use 2 - 3 times weekly, apply topically to scalp , neck and chest, leave on skin for at least 5 minutes before rinsing off.   Pancrelipase, Lip-Prot-Amyl, ([name]) 25000-79000 units CPEP Take 2 capsules by mouth with breakfast, with lunch, and with evening meal.   pantoprazole (PROTONIX) 40 MG tablet Take 1 tablet (40 mg total) by mouth every morning.   rosuvastatin (CRESTOR) 20 MG tablet Take 1 tablet (20 mg total) by mouth daily.   triamcinolone (KENALOG) 0.025 % ointment Apply 1 Application topically daily as needed.   valsartan (DIOVAN) 80 MG tablet Take 1 tablet (80 mg total) by mouth daily.   No facility-administered encounter medications on file as of 03/12/2023.    Allergies (verified) Meloxicam, Plavix [clopidogrel bisulfate], and Sulfamethoxazole-trimethoprim   History: Past Medical History:  Diagnosis Date   Actinic keratosis  Arthritis    fingers   Basal cell carcinoma 02/10/2008   left top of shoulder   Basal cell carcinoma 08/14/2022   superficial, right upper temple, [facility] 09/12/22   Basal cell carcinoma 11/14/2022   left upper forehead, [facility]   BCC (basal cell carcinoma) 10/21/2003   superficial spinal lower back   Carpal tunnel syndrome    Cataract    bilateral surgery repair   Chronic pancreatitis (HCC)    COPD (chronic  obstructive pulmonary disease) (HCC)    COVID-19    Esophageal reflux    Gastric polyps    hyperplastic   Herpes zoster    Hiatal hernia    Hypertension    Melanoma (HCC) 10/21/2003   right mid abdomen Clarks Level II Breslows Measurement 0.42 mm   Other B-complex deficiencies    Pneumonia    Pure hypercholesterolemia    Stroke (HCC) 01/16/2015   only on ASA   Past Surgical History:  Procedure Laterality Date   APPENDECTOMY     BACK SURGERY     Diskectomy between L4 and L5   CARPAL TUNNEL RELEASE Left 06/27/2021   Procedure: CARPAL TUNNEL RELEASE LEFT;  Surgeon: Cindee Salt, MD;  Location: [facility];  Service: Orthopedics;  Laterality: Left;  Regional with monitored anesthesia care   CATARACT EXTRACTION W/ INTRAOCULAR LENS  IMPLANT, BILATERAL  12/15 and 2/16   CHOLECYSTECTOMY     COLONOSCOPY  2011   MELANOMA EXCISION  ~2009   right upper abdomen   PARTIAL KNEE ARTHROPLASTY Right 05/01/2022   Procedure: UNICOMPARTMENTAL KNEE;  Surgeon: Sheral Apley, MD;  Location: [facility];  Service: Orthopedics;  Laterality: Right;   Family History  Problem Relation Age of Onset   Stroke Mother    Diabetes Mother    Emphysema Father        smoker   Crohn's disease Sister    Colitis Brother    Colon cancer Neg Hx    Colon polyps Neg Hx    Esophageal cancer Neg Hx    Rectal cancer Neg Hx    Stomach cancer Neg Hx    Social History   Socioeconomic History   Marital status: Married    Spouse name: Cody Willis   Number of children: 2   Years of education: high school, some college   Highest education level: Associate degree: occupational, Scientist, product/process development, or vocational program  Occupational History   Occupation: Truck Museum/gallery conservator    Comment: part time  Tobacco Use   Smoking status: Former    Types: Cigarettes   Smokeless tobacco: Never  Vaping Use   Vaping status: Never Used  Substance and Sexual Activity   Alcohol use: No    Alcohol/week: 0.0 standard drinks of  alcohol   Drug use: Never   Sexual activity: Not on file  Other Topics Concern   Not on file  Social History Narrative   No living will   Wife should make decisions for him if needed   Would accept resuscitation attempts   Not sure about tube feeds   Lives Cody Willis   2 children and 2 step children - some children live nearby   Has grandchildren and step grandchildren - 6 total   Enjoys: work   Work - Naval architect, has to stay in good shape for that   Social Determinants of Corporate investment banker Strain: Low Risk  (03/08/2023)   Overall Financial Resource Strain (CARDIA)    Difficulty of Paying Living  Expenses: Not hard at all  Food Insecurity: No Food Insecurity (03/08/2023)   Hunger Vital Sign    Worried About Running Out of Food in the Last Year: Never true    Ran Out of Food in the Last Year: Never true  Transportation Needs: No Transportation Needs (03/08/2023)   PRAPARE - Administrator, Civil Service (Medical): No    Lack of Transportation (Non-Medical): No  Physical Activity: Insufficiently Active (03/08/2023)   Exercise Vital Sign    Days of Exercise per Week: 4 days    Minutes of Exercise per Session: 20 min  Stress: No Stress Concern Present (03/08/2023)   [employer] of Occupational Health - Occupational Stress Questionnaire    Feeling of Stress : Not at all  Social Connections: Moderately Integrated (03/08/2023)   Social Connection and Isolation Panel [NHANES]    Frequency of Communication with Friends and Family: Three times a week    Frequency of Social Gatherings with Friends and Family: Twice a week    Attends Religious Services: More than 4 times per year    Active Member of [employer] or Organizations: No    Attends Engineer, structural: Never    Marital Status: Married    Tobacco Counseling Counseling given: Not Answered   Clinical Intake:  Pre-visit preparation completed: Yes  Pain : No/denies pain     BMI -  recorded: 27.28 Nutritional Status: BMI 25 -29 Overweight Nutritional Risks: None Diabetes: No  How often do you need to have someone help you when you read instructions, pamphlets, or other written materials from your doctor or pharmacy?: 1 - Never  Interpreter Needed?: No  Information entered by :: R. Maeci Kalbfleisch LPN   Activities of Daily Living    03/08/2023    6:48 PM 04/19/2022    2:21 PM  In your present state of health, do you have any difficulty performing the following activities:  Hearing? 0   Vision? 0   Comment glasses   Difficulty concentrating or making decisions? 0   Walking or climbing stairs? 0   Dressing or bathing? 0   Doing errands, shopping? 0 0  Preparing Food and eating ? N   Using the Toilet? N   In the past six months, have you accidently leaked urine? N   Do you have problems with loss of bowel control? N   Managing your Medications? N   Managing your Finances? N   Housekeeping or managing your Housekeeping? N     Patient Care Team: Glori Luis, MD as PCP - General (Family Medicine) Jake Bathe, MD as PCP - Cardiology (Cardiology) Kalman Shan, MD as Consulting Physician (Pulmonary Disease)  Indicate any recent Medical Services you may have received from other than [facility] providers in the past year (date may be approximate).     Assessment:   This is a routine wellness examination for Royer.  Hearing/Vision screen Hearing Screening - Comments:: No issues Vision Screening - Comments:: glasses   Goals Addressed             This Visit's Progress    Patient Stated       Wants to continue to stay active and eat right       Depression Screen    03/12/2023   10:03 AM 01/25/2023   10:25 AM 07/20/2022    9:55 AM 03/12/2022   10:15 AM 03/06/2022    1:36 PM 01/12/2022    9:52 AM 10/20/2020  2:45 PM  PHQ 2/9 Scores  PHQ - 2 Score 0 0 0 0 0 0 0  PHQ- 9 Score 0 0 0        Fall Risk    03/08/2023    6:48 PM 01/25/2023    10:25 AM 07/20/2022    9:55 AM 03/12/2022   10:15 AM 03/06/2022    1:37 PM  Fall Risk   Falls in the past year? 0 0 0 0 0  Number falls in past yr: 0 0 0 0 0  Injury with Fall? 0 0 0 0 0  Risk for fall due to : No Fall Risks No Fall Risks No Fall Risks No Fall Risks No Fall Risks  Follow up Falls prevention discussed;Falls evaluation completed Falls evaluation completed Falls evaluation completed Falls evaluation completed Falls evaluation completed    MEDICARE RISK AT HOME: Medicare Risk at Home Any stairs in or around the home?: No If so, are there any without handrails?: No Home free of loose throw rugs in walkways, pet beds, electrical cords, etc?: No Adequate lighting in your home to reduce risk of falls?: Yes Life alert?: No Use of a cane, walker or w/c?: No Grab bars in the bathroom?: No Shower chair or bench in shower?: No Elevated toilet seat or a handicapped toilet?: No   Cognitive Function:        03/12/2023   10:09 AM 03/06/2022    1:40 PM  6CIT Screen  What Year? 0 points 0 points  What month? 0 points 0 points  What time? 0 points 0 points  Count back from 20 2 points 0 points  Months in reverse 2 points 0 points  Repeat phrase 0 points 0 points  Total Score 4 points 0 points    Immunizations Immunization History  Administered Date(s) Administered   PFIZER(Purple Top)SARS-COV-2 Vaccination 06/27/2019, 07/18/2019, 02/22/2020   Pneumococcal Conjugate-13 03/09/2014   Pneumococcal Polysaccharide-23 04/30/2015   Td 04/09/2009    TDAP status: Due, Education has been provided regarding the importance of this vaccine. Advised may receive this vaccine at local pharmacy or Health Dept. Aware to provide a copy of the vaccination record if obtained from local pharmacy or Health Dept. Verbalized acceptance and understanding.  Flu Vaccine status: Declined, Education has been provided regarding the importance of this vaccine but patient still declined. Advised may  receive this vaccine at local pharmacy or Health Dept. Aware to provide a copy of the vaccination record if obtained from local pharmacy or Health Dept. Verbalized acceptance and understanding.  Pneumococcal vaccine status: Up to date  Covid-19 vaccine status: Information provided on how to obtain vaccines.   Qualifies for Shingles Vaccine? Yes   Zostavax completed No   Shingrix Completed?: No.    Education has been provided regarding the importance of this vaccine. Patient has been advised to call insurance company to determine out of pocket expense if they have not yet received this vaccine. Advised may also receive vaccine at local pharmacy or Health Dept. Verbalized acceptance and understanding.  Screening Tests Health Maintenance  Topic Date Due   Zoster Vaccines- Shingrix (1 of 2) Never done   DTaP/Tdap/Td (2 - Tdap) 04/10/2019   COVID-19 Vaccine (4 - 2023-24 season) 12/09/2022   Medicare Annual Wellness (AWV)  03/07/2023   INFLUENZA VACCINE  07/08/2023 (Originally 11/08/2022)   Pneumonia Vaccine 74+ Years old  Completed   HPV VACCINES  Aged Out   Lung Cancer Screening  Discontinued   Hepatitis C Screening  Discontinued    Health Maintenance  Health Maintenance Due  Topic Date Due   Zoster Vaccines- Shingrix (1 of 2) Never done   DTaP/Tdap/Td (2 - Tdap) 04/10/2019   COVID-19 Vaccine (4 - 2023-24 season) 12/09/2022   Medicare Annual Wellness (AWV)  03/07/2023    Colorectal cancer screening: No longer required.   Lung Cancer Screening: (Low Dose CT Chest recommended if Age 80-80 years, 20 pack-year currently smoking OR have quit w/in 15years.) does not qualify.     Additional Screening:  Hepatitis C Screening: does qualify; Completed 10/2020  Vision Screening: Recommended annual ophthalmology exams for early detection of glaucoma and other disorders of the eye. Is the patient up to date with their annual eye exam?  Yes  Who is the provider or what is the name of the  office in which the patient attends annual eye exams? [facility] If pt is not established with a provider, would they like to be referred to a provider to establish care? No .   Dental Screening: Recommended annual dental exams for proper oral hygiene    Community Resource Referral / Chronic Care Management: CRR required this visit?  No   CCM required this visit?  No     Plan:     I have personally reviewed and noted the following in the patient's chart:   Medical and social history Use of alcohol, tobacco or illicit drugs  Current medications and supplements including opioid prescriptions. Patient is not currently taking opioid prescriptions. Functional ability and status Nutritional status Physical activity Advanced directives List of other physicians Hospitalizations, surgeries, and ER visits in previous 12 months Vitals Screenings to include cognitive, depression, and falls Referrals and appointments  In addition, I have reviewed and discussed with patient certain preventive protocols, quality metrics, and best practice recommendations. A written personalized care plan for preventive services as well as general preventive health recommendations were provided to patient.     Sydell Axon, LPN   [id number]/11/[id number]   After Visit Summary: (MyChart) Due to this being a telephonic visit, the after visit summary with patients personalized plan was offered to patient via MyChart   Nurse Notes: None

## 2023-03-12 NOTE — Patient Instructions (Signed)
Cody Willis , Thank you for taking time to come for your Medicare Wellness Visit. I appreciate your ongoing commitment to your health goals. Please review the following plan we discussed and let me know if I can assist you in the future.   Referrals/Orders/Follow-Ups/Clinician Recommendations: Remember to update your Tetanus (Tdap), shingles vaccines and consider getting a flu shot.  This is a list of the screening recommended for you and due dates:  Health Maintenance  Topic Date Due   Zoster (Shingles) Vaccine (1 of 2) Never done   DTaP/Tdap/Td vaccine (2 - Tdap) 04/10/2019   COVID-19 Vaccine (4 - 2023-24 season) 12/09/2022   Flu Shot  07/08/2023*   Medicare Annual Wellness Visit  03/11/2024   Pneumonia Vaccine  Completed   HPV Vaccine  Aged Out   Screening for Lung Cancer  Discontinued   Hepatitis C Screening  Discontinued  *Topic was postponed. The date shown is not the original due date.    Advanced directives: (Declined) Advance directive discussed with you today. Even though you declined this today, please call our office should you change your mind, and we can give you the proper paperwork for you to fill out.  Next Medicare Annual Wellness Visit scheduled for next year: Yes 03/13/24 @ 10:10

## 2023-03-26 DIAGNOSIS — H353233 Exudative age-related macular degeneration, bilateral, with inactive scar: Secondary | ICD-10-CM | POA: Diagnosis not present

## 2023-04-29 ENCOUNTER — Ambulatory Visit (INDEPENDENT_AMBULATORY_CARE_PROVIDER_SITE_OTHER): Payer: HMO | Admitting: Family Medicine

## 2023-04-29 ENCOUNTER — Encounter: Payer: Self-pay | Admitting: Family Medicine

## 2023-04-29 VITALS — BP 150/74 | HR 66 | Ht 66.0 in | Wt 169.2 lb

## 2023-04-29 DIAGNOSIS — K2100 Gastro-esophageal reflux disease with esophagitis, without bleeding: Secondary | ICD-10-CM | POA: Diagnosis not present

## 2023-04-29 DIAGNOSIS — J449 Chronic obstructive pulmonary disease, unspecified: Secondary | ICD-10-CM

## 2023-04-29 DIAGNOSIS — E538 Deficiency of other specified B group vitamins: Secondary | ICD-10-CM | POA: Insufficient documentation

## 2023-04-29 DIAGNOSIS — E782 Mixed hyperlipidemia: Secondary | ICD-10-CM

## 2023-04-29 DIAGNOSIS — D72828 Other elevated white blood cell count: Secondary | ICD-10-CM

## 2023-04-29 DIAGNOSIS — I10 Essential (primary) hypertension: Secondary | ICD-10-CM

## 2023-04-29 LAB — VITAMIN D 25 HYDROXY (VIT D DEFICIENCY, FRACTURES): VITD: 20.04 ng/mL — ABNORMAL LOW (ref 30.00–100.00)

## 2023-04-29 LAB — VITAMIN B12: Vitamin B-12: 693 pg/mL (ref 211–911)

## 2023-04-29 MED ORDER — PREDNISONE 20 MG PO TABS: 40.0000 mg | ORAL_TABLET | Freq: Every day | ORAL | 0 refills | Status: DC

## 2023-04-29 MED ORDER — VALSARTAN 80 MG PO TABS: 80.0000 mg | ORAL_TABLET | Freq: Every day | ORAL | 1 refills | Status: DC

## 2023-04-29 NOTE — Progress Notes (Signed)
Marikay Alar, MD Phone: [phone]  Cody Willis is a 83 y.o. male who presents today for f/u.  COPD: Patient notes has been using his albuterol inhaler daily recently.  He is had some coughing and wheezing.  He reports the wheezing is been going on for 6 or so months.  He has been unable to afford any of the long-term inhalers that we have sent to his pharmacy.  B12 deficiency: Patient has been doing B12 injections once monthly at home.  He is on Protonix and GI has requested that we check his B12 and vitamin D.  Social History   Tobacco Use  Smoking Status Former   Types: Cigarettes  Smokeless Tobacco Never    Current Outpatient Medications on File Prior to Visit  Medication Sig Dispense Refill   acetaminophen (TYLENOL) 500 MG tablet Take 2 tablets (1,000 mg total) by mouth every 6 (six) hours as needed for mild pain or moderate pain. 60 tablet 0   albuterol (VENTOLIN HFA) 108 (90 Base) MCG/ACT inhaler Inhale 1-2 puffs into the lungs every 4 (four) hours as needed for wheezing or shortness of breath. 2 each 1   aspirin EC 81 MG tablet Take 1 tablet (81 mg total) by mouth 2 (two) times daily. To prevent blood clots for 30 days after surgery. (Patient taking differently: Take 81 mg by mouth daily. To prevent blood clots for 30 days after surgery.) 60 tablet 0   cyanocobalamin (VITAMIN B12) 1000 MCG/ML injection INJECT INTO THE MUSCLE EVERY 30 DAYS. PHARMACY-PLEASE PROVIDE APPROPRIATE NEEDLES AND SYRINGES 3 mL 3   fluorouracil (EFUDEX) 5 % cream Apply to forehead and temples once daily x 3 weeks. 40 g 0   ketoconazole (NIZORAL) 2 % shampoo use 2 - 3 times weekly, apply topically to scalp , neck and chest, leave on skin for at least 5 minutes before rinsing off. 120 mL 6   Pancrelipase, Lip-Prot-Amyl, ([name]) 25000-79000 units CPEP Take 2 capsules by mouth with breakfast, with lunch, and with evening meal. 540 capsule 3   pantoprazole (PROTONIX) 40 MG tablet Take 1 tablet (40  mg total) by mouth every morning. 90 tablet 3   rosuvastatin (CRESTOR) 20 MG tablet Take 1 tablet (20 mg total) by mouth daily. 90 tablet 3   triamcinolone (KENALOG) 0.025 % ointment Apply 1 Application topically daily as needed. 80 g 0   No current facility-administered medications on file prior to visit.     ROS see history of present illness  Objective  Physical Exam Vitals:   04/29/23 1039 04/29/23 1050  BP: (!) 156/72 (!) 150/74  Pulse: 66   SpO2: 99%     BP Readings from Last 3 Encounters:  04/29/23 (!) 150/74  03/11/23 132/72  02/19/23 128/68   Wt Readings from Last 3 Encounters:  04/29/23 169 lb 3.2 oz (76.7 kg)  03/12/23 169 lb (76.7 kg)  03/11/23 169 lb (76.7 kg)    Physical Exam Constitutional:      General: He is not in acute distress.    Appearance: He is not diaphoretic.  Cardiovascular:     Rate and Rhythm: Normal rate and regular rhythm.     Heart sounds: Normal heart sounds.  Pulmonary:     Effort: Pulmonary effort is normal. No respiratory distress.     Breath sounds: Wheezing (Throughout) present. No rales.  Skin:    General: Skin is warm and dry.  Neurological:     Mental Status: He is alert.  Assessment/Plan: Please see individual problem list.  Primary hypertension Assessment & Plan: Chronic issue.  Blood pressure elevated today though had been adequately controlled previously.  Patient will continue valsartan 80 mg daily.  Patient will return in 2 weeks for nurse BP check.  Orders: -     Valsartan; Take 1 tablet (80 mg total) by mouth daily.  Dispense: 90 tablet; Refill: 1  Mixed hyperlipidemia  Other elevated white blood cell (WBC) count  Chronic obstructive pulmonary disease, unspecified COPD type (HCC) Assessment & Plan: Chronic issue.  With exacerbation today.  Will treat with prednisone 40 mg daily for 5 days.  Will refer to our clinical pharmacist to get her input on inhaler options for the patient.  He can continue his  albuterol inhaler 1 to 2 puffs every 4 hours as needed for wheezing or shortness of breath.  Patient was counseled on the risk of sleep issues and agitation with the prednisone.  Orders: -     predniSONE; Take 2 tablets (40 mg total) by mouth daily with breakfast.  Dispense: 10 tablet; Refill: 0 -     AMB Referral [facility]BCI Care Management  B12 deficiency Assessment & Plan: Chronic issue.  Check B12 today.  Patient will continue B12 1000 mcg once monthly.  Orders: -     Vitamin B12  Gastroesophageal reflux disease with esophagitis, unspecified whether hemorrhage Assessment & Plan: Chronic issue.  Has seen GI recently.  Checking vitamin D.  Orders: -     VITAMIN D 25 Hydroxy (Vit-D Deficiency, Fractures)     Return in about 2 weeks (around 05/13/2023) for nurse BP check, 3 months transfer of care Dr Clent Ridges.   Marikay Alar, MD [facility]

## 2023-04-29 NOTE — Assessment & Plan Note (Signed)
Chronic issue.  Has seen GI recently.  Checking vitamin D.

## 2023-04-29 NOTE — Assessment & Plan Note (Signed)
Chronic issue.  Check B12 today.  Patient will continue B12 1000 mcg once monthly.

## 2023-04-29 NOTE — Assessment & Plan Note (Addendum)
Chronic issue.  With exacerbation today.  Will treat with prednisone 40 mg daily for 5 days.  Will refer to our clinical pharmacist to get her input on inhaler options for the patient.  He can continue his albuterol inhaler 1 to 2 puffs every 4 hours as needed for wheezing or shortness of breath.  Patient was counseled on the risk of sleep issues and agitation with the prednisone.

## 2023-04-29 NOTE — Assessment & Plan Note (Signed)
Chronic issue.  Blood pressure elevated today though had been adequately controlled previously.  Patient will continue valsartan 80 mg daily.  Patient will return in 2 weeks for nurse BP check.

## 2023-04-29 NOTE — Patient Instructions (Signed)
Nice to see you. Will have you return in 2 weeks to have your blood pressure checked again. If you get excessively agitated or if you have extreme trouble sleeping on the prednisone please let us know. The pharmacist should reach out to you to schedule a visit.

## 2023-04-30 ENCOUNTER — Other Ambulatory Visit: Payer: Self-pay

## 2023-04-30 DIAGNOSIS — M839 Adult osteomalacia, unspecified: Secondary | ICD-10-CM

## 2023-05-01 ENCOUNTER — Telehealth: Payer: Self-pay

## 2023-05-01 NOTE — Progress Notes (Signed)
Care Guide Pharmacy Note  05/01/2023 Name: Cody Willis MRN: [id number] DOB: 1940/07/04  Referred By: Glori Luis, MD Reason for referral: Care Coordination (Outreach to schedule with Pharm d )   Cody Willis is a 83 y.o. year old male who is a primary care patient of Glori Luis, MD.  Cody Willis was referred to the pharmacist for assistance related to: COPD  Successful contact was made with the patient to discuss pharmacy services including being ready for the pharmacist to call at least 5 minutes before the scheduled appointment time and to have medication bottles and any blood pressure readings ready for review. The patient agreed to meet with the pharmacist via telephone visit on (date/time).05/06/2023  Penne Lash , RMA     [place]  [facility] Guide  Direct Dial: [phone]  Website: [email or web address]

## 2023-05-06 ENCOUNTER — Other Ambulatory Visit (INDEPENDENT_AMBULATORY_CARE_PROVIDER_SITE_OTHER): Payer: HMO | Admitting: Pharmacist

## 2023-05-06 ENCOUNTER — Encounter: Payer: Self-pay | Admitting: Pharmacist

## 2023-05-06 DIAGNOSIS — J449 Chronic obstructive pulmonary disease, unspecified: Secondary | ICD-10-CM

## 2023-05-06 NOTE — Patient Instructions (Addendum)
[name],   It was a pleasure to speak with you today! As we discussed:?   Your new medicine: Breztri - 2 puffs by mouth twice daily - Rinse your mouth with water and spit out after every use  __________________________________________________  We have initiated your application for the following medication assistance program:   [employer] Zen[employer] ([place] and [place]) Medication: Breztri inhaler Phone: [phone] M-F 9am-6pm ET    Fax: [phone]  For any questions or concerns regarding your application status, or questions regarding your medication shipment, refills, or delivery, please contact the program by phone.  Next steps:  Your application will be placed in the front office for your signature. You may sign this next week at your lab appointment Please sign the first page, and enter your annual income in the filed I have highlighted.  Once you have signed, we will fax the application to the program.    BREZTRI We discussed that this inhaler is called a maintenance inhaler (meaning you use it every day regardless of how you feel. It has 3 medicines in it: LAMA and LABA medicines are bronchodilators (like a longer lasting albuterol), which help muscles around the airways in your lungs stay relaxed to prevent symptoms, such as wheezing, cough, chest tightness, and shortness of breath. The third medication is a steroid and will help reduce and prevent inflammation in the airways and in the lungs.  Since this is breathed in, it is not the same as taking a steroid pill (it does not have the same side effects, should not affect your blood pressure/appetite, etc.).  To keep the steroid in your lungs only, it is important to Rinse your mouth with water without swallowing after using your inhaler every time to help reduce your chance of getting thrush.  Ginette Pitman is a fungal infection that can occur when steroid is frequently left over in your mouth and is not rinsed away.   See a video of  how to use your inhaler: [email or web address]  ___________________________________________________________ There may also be a program for your [name] through [facility] Patient Assistance program.  If you do the math and find that your income is below $30,660 in 2024 then the program requires you to apply for Medicare Extra Help and to send the denial letter with your application for [name].   Who may be eligible for Medicare Extra Help? Your Household Size Annual income limit Resource Limit*      Married Couple $30,660 [phone]   *Resources include:   -  Money in a checking, savings, or retirement account   -  Stocks   -  Bonds  Loree Fee, PharmD Clinical Pharmacist [facility] [phone]

## 2023-05-06 NOTE — Progress Notes (Signed)
Noted

## 2023-05-06 NOTE — Progress Notes (Signed)
   05/06/2023 Name: Cody Willis MRN: [id number] DOB: 1940/06/07  Subjective  No chief complaint on file.   Care Team: Primary Care Provider: Glori Luis, MD  Reason for visit: ?  Cody Willis is a 83 y.o. male who presents today for a telephone visit with the pharmacist due to medication access concerns regarding inhaler medications. ?   Medication Access: ?  Prescription drug coverage: Payor: [employer]VANTAGE / Plan: [employer] ADVANTAGE HMO / Product Type: *No Product type* / .   Current Patient Assistance:  None  Reports copay of $250 for maintenance inhaler.  Albuterol ~$10/month,   Patient lives in a household of 2 with an estimated combined annual income of 20,000  via Product manager.  Medicare LIS Eligible: No  Couples Asset Limit: $34,360   Objective   Component Ref Range & Units 02/28/2021  FVC-Pre 2.40  FVC-%Pred-Pre 70  FVC-Post 2.69  FVC-%Pred-Post 78  FVC-%Change-Post 11  FEV1-Pre 1.30  FEV1-%Pred-Pre 53  FEV1-Post 1.58  FEV1-%Pred-Post 65  FEV1-%Change-Post 21  FEV6-Pre 2.28  FEV6-%Pred-Pre 72  FEV6-Post 2.62  FEV6-%Pred-Post 82  FEV6-%Change-Post 14  Pre FEV1/FVC ratio 54  FEV1FVC-%Pred-Pre 75  Post FEV1/FVC ratio 59  FEV1FVC-%Change-Post 8  Pre FEV6/FVC Ratio 95  FEV6FVC-%Pred-Pre 102  Post FEV6/FVC ratio 97  FEV6FVC-%Pred-Post 105  FEV6FVC-%Change-Post 2  FEF 25-75 Pre 0.53  FEF2575-%Pred-Pre 31  FEF 25-75 Post 0.92  FEF2575-%Pred-Post 55  FEF2575-%Change-Post 74   [place]: 300% FPL LAMA/LABA/ICS    Breztri (glycopyrrolate/formoterol/budesonide) LAMA/LABA    Bevespi Aerosphere (glycopyrrolate and formoterol fumarate) SABA-ICS     Airspura (albuterol/budesonide)  BI Cares: 200% FPL LAMA    Spiriva (tiotropium) LAMA/LABA    Stioloto (tiotropium/olodaterol) SAMA/SABA    Combivent (ipratropium/albuterol)  GSK: 300% FPL (Medicare D patients must reach $600 OOP) LABA/ICS    Breo Ellipta  (vilanterol/fluticasone) LAMA/LABA    Anoro Ellipta (umeclidinium/vilanterol) ICS    Arnuity Ellipta (fluticasone) LAMA/LABA/ICS    Trellegy Ellipta (umeclidinium/vilanterol/fluticasone) LABA (DPI)    Severent Disckus (salmeterol powder)    Assessment and Plan:   1. COPD / Medication Access COPD uncontrolled and untreated due to medication cost barriers. Current daily symptoms requiring albuterol use. Multiple moderate exacerbation history leading to many ED visits over the past several years with most recent 04/29/23. No hospitalizations.  Most recent PFTs from 2022 show GOLD2 moderate obstructive disease. FVC 2.69 (78%), FEV1 1.58 (65%), FEV1/FVC 59 . Responsiveness to bronchodilator >>12% suggests possible asthmatic component.   Group E per exacerbation hx: patient is indicated for Triple therapy per GOLD guidelines.  [employer]stra[employer]en[employer] ([place]) BREZTRI application started today given to OOP spend requirement  PAP details in separate documentation encounter  Patient is not eligible for copay cards due to government insurance. May also be eligible to receive [name] through [employer] PAP (though will require LIS denial letter). Patient confirms understanding. Will consider once [place] application completed or if copay becomes burdensome.    Future Appointments  Date Time Provider Department Center  05/06/2023 10:00 AM [facility] CCM PHARMACIST [facility]  05/13/2023 11:30 AM [facility] CLINICAL SUPPORT [facility]  05/20/2023  9:15 AM Willeen Niece, MD [facility] None  05/31/2023 10:10 AM [facility] [facility] None  07/29/2023 10:30 AM [facility] LAB [facility]  08/15/2023 10:00 AM Dana Allan, MD [facility]  03/16/2024 10:10 AM [facility] ANNUAL WELLNESS VISIT [facility]    Loree Fee, PharmD Clinical Pharmacist [facility] [phone]

## 2023-05-06 NOTE — Progress Notes (Unsigned)
Manufacturer Assistance Program (MAP) Application   Manufacturer: AstraZeneca ([place])    (New enrollment) Medication(s): Breztri  Patient Portion of Application:  05/06/23:  Filled out yb pharmacist. Patient plans to sign at lab visit 05/13/23 Income Documentation: N/A - Electronic verification elected.  Provider Portion of Application:  05/06/23: Provider portion completed by PharmD and uploaded PCP eFax folder for signature.  Prescription(s): Included in MAP application.  Application Status: Not submitted (pending signatures)  Next Steps: []    Patient signature (to sign in office 05/13/23) []    PCP signature []    Upon PCP signature, application to be placed in front office (in patient folder "H" for last name Fiorella) - PCP Clinical pool cc'd []    Once patient has signed, Application to be faxed to AZ&[place] Fax: [phone] with copy of patient's insurance card AND scanned into patient chart []    Program approval/denial received via fax and documented in chart  Forwarded to [facility] CPhT Patient Advocate Team for future correspondences/re-enrollment.  Note routed to PCP Clinic Pool to ensure PCP signature is obtained and application is faxed.  *[facility] clinic team - Please Addend/update this note as the "Next Steps" are completed in office*

## 2023-05-07 ENCOUNTER — Telehealth: Payer: Self-pay | Admitting: Family Medicine

## 2023-05-07 NOTE — Telephone Encounter (Signed)
Copied from CRM [id number]. Topic: General - Other >> May 07, 2023  1:09 PM Deaijah H wrote: Reason for CRM: Patient called in to return missed call advised note: "about the Patient's inhaler and him needing to come in as soon as possible to fill a form out for the inhaler." Patient stated he might come by tomorrow to fill out

## 2023-05-07 NOTE — Telephone Encounter (Signed)
See previous note

## 2023-05-07 NOTE — Telephone Encounter (Signed)
Form placed under H in folder in front office for pt to sign.

## 2023-05-07 NOTE — Telephone Encounter (Signed)
Left message to call the office back regarding the message below about the Patient's inhaler and him needing to come in as soon as possible to fill a form out for the inhaler.

## 2023-05-07 NOTE — Telephone Encounter (Signed)
Patient called in to return missed call advised note: "about the Patient's inhaler and him needing to come in as soon as possible to fill a form out for the inhaler." Patient stated he might come by tomorrow to fill out

## 2023-05-07 NOTE — Telephone Encounter (Signed)
Patient assistance form received in my in basket for the patient's new inhaler.  There is a form that the patient needs to fill out.  Please make him aware of this and he should come to the office to fill this out as soon as possible.

## 2023-05-08 NOTE — Telephone Encounter (Signed)
Faxed to [place]

## 2023-05-08 NOTE — Telephone Encounter (Signed)
Patient came into office and signed [place] ME  application. Application is upfront in Dr Purvis Sheffield color folder.

## 2023-05-13 ENCOUNTER — Ambulatory Visit: Payer: HMO

## 2023-05-13 NOTE — Progress Notes (Signed)
Pt presented for BP check. Pt was identified through two identfiers.    Pt confirmed he takes: valsartan (DIOVAN) 80 MG    Pt denies: headaches, SOB, Chest pain, dizziness, nausea, blurred vision, numbness or tingling   Pt stated he has been checking his BP at home and his readings have been high. He wonders if the BP medication needs to be changed and if BP gets worst the older you get?   Pt stated his BP has read as high as 150/80 ish and he stated he usually waits and checks again and its back in the 120's  BP after 5-6 minutes of rest: BP: 122/71 P: 67    BP WNL pt discharged and advised that he can bring his BP cuff in at his next OV to get it calibrated or he can book a NV.

## 2023-05-14 NOTE — Progress Notes (Signed)
 Lvm

## 2023-05-20 ENCOUNTER — Ambulatory Visit: Payer: HMO | Admitting: Dermatology

## 2023-05-20 DIAGNOSIS — Z7189 Other specified counseling: Secondary | ICD-10-CM

## 2023-05-20 DIAGNOSIS — W908XXA Exposure to other nonionizing radiation, initial encounter: Secondary | ICD-10-CM | POA: Diagnosis not present

## 2023-05-20 DIAGNOSIS — Z5111 Encounter for antineoplastic chemotherapy: Secondary | ICD-10-CM

## 2023-05-20 DIAGNOSIS — C44310 Basal cell carcinoma of skin of unspecified parts of face: Secondary | ICD-10-CM

## 2023-05-20 DIAGNOSIS — C44320 Squamous cell carcinoma of skin of unspecified parts of face: Secondary | ICD-10-CM

## 2023-05-20 DIAGNOSIS — C4492 Squamous cell carcinoma of skin, unspecified: Secondary | ICD-10-CM

## 2023-05-20 DIAGNOSIS — L570 Actinic keratosis: Secondary | ICD-10-CM | POA: Diagnosis not present

## 2023-05-20 DIAGNOSIS — C44311 Basal cell carcinoma of skin of nose: Secondary | ICD-10-CM | POA: Diagnosis not present

## 2023-05-20 DIAGNOSIS — L219 Seborrheic dermatitis, unspecified: Secondary | ICD-10-CM

## 2023-05-20 DIAGNOSIS — L578 Other skin changes due to chronic exposure to nonionizing radiation: Secondary | ICD-10-CM

## 2023-05-20 DIAGNOSIS — L299 Pruritus, unspecified: Secondary | ICD-10-CM

## 2023-05-20 DIAGNOSIS — L853 Xerosis cutis: Secondary | ICD-10-CM

## 2023-05-20 DIAGNOSIS — C44319 Basal cell carcinoma of skin of other parts of face: Secondary | ICD-10-CM

## 2023-05-20 DIAGNOSIS — C44329 Squamous cell carcinoma of skin of other parts of face: Secondary | ICD-10-CM

## 2023-05-20 DIAGNOSIS — D492 Neoplasm of unspecified behavior of bone, soft tissue, and skin: Secondary | ICD-10-CM

## 2023-05-20 HISTORY — DX: Squamous cell carcinoma of skin, unspecified: C44.92

## 2023-05-20 NOTE — Patient Instructions (Addendum)
 - Start in 2 weeks  5-fluorouracil  cream apply twice a day for 2 weeks to affected areas including frontal scalp, forehead, temples.    5-Fluorouracil / Patient Education   Actinic keratoses are the dry, red scaly spots on the skin caused by sun damage. A portion of these spots can turn into skin cancer with time, and treating them can help prevent development of skin cancer.   Treatment of these spots requires removal of the defective skin cells. There are various ways to remove actinic keratoses, including freezing with liquid nitrogen, treatment with creams, or treatment with a blue light procedure in the office.   5-fluorouracil  cream is a topical cream used to treat actinic keratoses. It works by interfering with the growth of abnormal fast-growing skin cells, such as actinic keratoses. These cells peel off and are replaced by healthy ones.   5-fluorouracil /calcipotriene is a combination of the 5-fluorouracil  cream with a vitamin D  analog cream called calcipotriene. The calcipotriene alone does not treat actinic keratoses. However, when it is combined with 5-fluorouracil , it helps the 5-fluorouracil  treat the actinic keratoses much faster so that the same results can be achieved with a much shorter treatment time.  INSTRUCTIONS FOR 5-FLUOROURACIL    Avoid contact with your eyes, nostrils, and mouth. Do not use 5-fluorouracil  cream on infected or open wounds.   You will develop redness, irritation and some crusting at areas where you have pre-cancer damage/actinic keratoses. IF YOU DEVELOP PAIN, BLEEDING, OR SIGNIFICANT CRUSTING, STOP THE TREATMENT EARLY - you have already gotten a good response and the actinic keratoses should clear up well.  Wash your hands after applying 5-fluorouracil  5% cream on your skin.   A moisturizer or sunscreen with a minimum SPF 30 should be applied each morning.   Once you have finished the treatment, you can apply a thin layer of Vaseline twice a day to  irritated areas to soothe and calm the areas more quickly. If you experience significant discomfort, contact your physician.  For some patients it is necessary to repeat the treatment for best results.  SIDE EFFECTS: When using 5-fluorouracil  , you may have mild irritation, such as redness, dryness, swelling, or a mild burning sensation. This usually resolves within 2 weeks. The more actinic keratoses you have, the more redness and inflammation you can expect during treatment. Eye irritation has been reported rarely. If this occurs, please let us  know.  If you have any trouble using this cream, please call the office. If you have any other questions about this information, please do not hesitate to ask me before you leave the office.        Gentle Skin Care Guide  1. Bathe no more than once a day.  2. Avoid bathing in hot water  3. Use a mild soap like Dove, Vanicream, Cetaphil, CeraVe. Can use Lever 2000 or Cetaphil antibacterial soap  4. Use soap only where you need it. On most days, use it under your arms, between your legs, and on your feet. Let the water rinse other areas unless visibly dirty.  5. When you get out of the bath/shower, use a towel to gently blot your skin dry, don't rub it.  6. While your skin is still a little damp, apply a moisturizing cream such as Vanicream, CeraVe, Cetaphil, Eucerin, Sarna lotion or plain Vaseline Jelly. For hands apply Neutrogena [place] Hand Cream or Excipial Hand Cream.  7. Reapply moisturizer any time you start to itch or feel dry.  8. Sometimes using free  and clear laundry detergents can be helpful. Fabric softener sheets should be avoided. Downy Free & Gentle liquid, or any liquid fabric softener that is free of dyes and perfumes, it acceptable to use  9. If your doctor has given you prescription creams you may apply moisturizers over them          Wound Care Instructions  Cleanse wound gently with soap and water once a day  then pat dry with clean gauze. Apply a thin coat of Petrolatum (petroleum jelly, "Vaseline") over the wound (unless you have an allergy to this). We recommend that you use a new, sterile tube of Vaseline. Do not pick or remove scabs. Do not remove the yellow or white "healing tissue" from the base of the wound.  Cover the wound with fresh, clean, nonstick gauze and secure with paper tape. You may use Band-Aids in place of gauze and tape if the wound is small enough, but would recommend trimming much of the tape off as there is often too much. Sometimes Band-Aids can irritate the skin.  You should call the office for your biopsy report after 1 week if you have not already been contacted.  If you experience any problems, such as abnormal amounts of bleeding, swelling, significant bruising, significant pain, or evidence of infection, please call the office immediately.  FOR ADULT SURGERY PATIENTS: If you need something for pain relief you may take 1 extra strength Tylenol  (acetaminophen ) AND 2 Ibuprofen  (200mg  each) together every 4 hours as needed for pain. (do not take these if you are allergic to them or if you have a reason you should not take them.) Typically, you may only need pain medication for 1 to 3 days.     Cryotherapy Aftercare  Wash gently with soap and water everyday.   Apply Vaseline and Band-Aid daily until healed.        Due to recent changes in healthcare laws, you may see results of your pathology and/or laboratory studies on MyChart before the doctors have had a chance to review them. We understand that in some cases there may be results that are confusing or concerning to you. Please understand that not all results are received at the same time and often the doctors may need to interpret multiple results in order to provide you with the best plan of care or course of treatment. Therefore, we ask that you please give us  2 business days to thoroughly review all your results  before contacting the office for clarification. Should we see a critical lab result, you will be contacted sooner.   If You Need Anything After Your Visit  If you have any questions or concerns for your doctor, please call our main line at [phone] and press option 4 to reach your doctor's medical assistant. If no one answers, please leave a voicemail as directed and we will return your call as soon as possible. Messages left after 4 pm will be answered the following business day.   You may also send us  a message via MyChart. We typically respond to MyChart messages within 1-2 business days.  For prescription refills, please ask your pharmacy to contact our office. Our fax number is [phone].  If you have an urgent issue when the clinic is closed that cannot wait until the next business day, you can page your doctor at the number below.    Please note that while we do our best to be available for urgent issues outside of office hours,  we are not available 24/7.   If you have an urgent issue and are unable to reach us , you may choose to seek medical care at your doctor's office, retail clinic, urgent care center, or emergency room.  If you have a medical emergency, please immediately call 911 or go to the emergency department.  Pager Numbers  - Dr. Bary Likes: [phone]  - Dr. Annette Barters: [phone]  - Dr. Felipe Horton: [phone]   In the event of inclement weather, please call our main line at [phone] for an update on the status of any delays or closures.  Dermatology Medication Tips: Please keep the boxes that topical medications come in in order to help keep track of the instructions about where and how to use these. Pharmacies typically print the medication instructions only on the boxes and not directly on the medication tubes.   If your medication is too expensive, please contact our office at [phone] option 4 or send us  a message through MyChart.   We are unable  to tell what your co-pay for medications will be in advance as this is different depending on your insurance coverage. However, we may be able to find a substitute medication at lower cost or fill out paperwork to get insurance to cover a needed medication.   If a prior authorization is required to get your medication covered by your insurance company, please allow us  1-2 business days to complete this process.  Drug prices often vary depending on where the prescription is filled and some pharmacies may offer cheaper prices.  The website www.goodrx.com contains coupons for medications through different pharmacies. The prices here do not account for what the cost may be with help from insurance (it may be cheaper with your insurance), but the website can give you the price if you did not use any insurance.  - You can print the associated coupon and take it with your prescription to the pharmacy.  - You may also stop by our office during regular business hours and pick up a GoodRx coupon card.  - If you need your prescription sent electronically to a different pharmacy, notify our office through [facility] or by phone at [phone] option 4.     Si Usted Necesita Algo Despus de Su Visita  Tambin puede enviarnos un mensaje a travs de Clinical cytogeneticist. Por lo general respondemos a los mensajes de MyChart en el transcurso de 1 a 2 das hbiles.  Para renovar recetas, por favor pida a su farmacia que se ponga en contacto con nuestra oficina. Franz Jacks de fax es [place] [phone].  Si tiene un asunto urgente cuando la clnica est cerrada y que no puede esperar hasta el siguiente da hbil, puede llamar/localizar a su doctor(a) al nmero que aparece a continuacin.   Por favor, tenga en cuenta que aunque hacemos todo lo posible para estar disponibles para asuntos urgentes fuera del horario de [place], no estamos disponibles las 24 horas del da, los 7 [address]  [address] de la [place].   Si tiene un  problema urgente y no puede comunicarse con nosotros, puede optar por buscar atencin mdica  en el consultorio de su doctor(a), en una clnica privada, en un centro de atencin urgente o en una sala de emergencias.  Si tiene Engineer, drilling, por favor llame inmediatamente al 911 o vaya a la sala de emergencias.  Nmeros de bper  - Dr. Bary Likes: [phone]  - Dra. Annette Barters: [phone]  - Dr. Felipe Horton: [phone]   En caso de inclemencias  del [place], por favor llame a Bonnell Butcher lnea principal al [phone] para una actualizacin sobre el estado de cualquier retraso o cierre.  Consejos para la medicacin en dermatologa: Por favor, guarde las cajas en las que vienen los medicamentos de uso tpico para ayudarle a seguir las instrucciones sobre dnde y cmo usarlos. Las farmacias generalmente imprimen las instrucciones del medicamento slo en las cajas y no directamente en los tubos del [place].   Si su medicamento es muy caro, por favor, pngase en contacto con Bettyjane Brunet llamando al [phone] y presione la opcin 4 o envenos un mensaje a travs de Clinical cytogeneticist.   No podemos decirle cul ser su copago por los medicamentos por adelantado ya que esto es diferente dependiendo de la cobertura de su seguro. Sin embargo, es posible que podamos encontrar un medicamento sustituto a Audiological scientist un formulario para que el seguro cubra el medicamento que se considera necesario.   Si se requiere una autorizacin previa para que su compaa de seguros [place] su medicamento, por favor permtanos de 1 a 2 das hbiles para completar este proceso.  Los precios de los medicamentos varan con frecuencia dependiendo del Environmental consultant de dnde se surte la receta y alguna farmacias pueden ofrecer precios ms baratos.  El sitio web www.goodrx.com tiene cupones para medicamentos de Health and safety inspector. Los precios aqu no tienen en cuenta lo que podra costar con la ayuda del seguro (puede ser ms  barato con su seguro), pero el sitio web puede darle el precio si no utiliz Tourist information centre manager.  - Puede imprimir el cupn correspondiente y llevarlo con su receta a la farmacia.  - Tambin puede pasar por nuestra oficina durante el horario de atencin regular y Education officer, museum una tarjeta de cupones de GoodRx.  - Si necesita que su receta se enve electrnicamente a una farmacia diferente, informe a nuestra oficina a travs de MyChart de [place] o por telfono llamando al [phone] y presione la opcin 4.

## 2023-05-20 NOTE — Progress Notes (Signed)
Follow-Up Visit   Subjective  Cody Willis is a 83 y.o. male who presents for the following: 6 months f/u on precancers on his face, patient was suppose have a PDT treatment on his face but he never came in for his PDT appointment. Patient c/o itchy areas on his ears and lower legs.  The patient has spots, moles and lesions to be evaluated, some may be new or changing and the patient may have concern these could be cancer.   The following portions of the chart were reviewed this encounter and updated as appropriate: medications, allergies, medical history  Review of Systems:  No other skin or systemic complaints except as noted in HPI or Assessment and Plan.  Objective  Well appearing patient in no apparent distress; mood and affect are within normal limits.  A focused examination was performed of the following areas: face,scalp, ears    Relevant exam findings are noted in the Assessment and Plan.  left side burn 4.0 mm pink flesh papule   left temple 4.0 mm pink pearly papule   nasal dorsum 4.0 mm pink pearly macule  left frontal hairline x 1, left forehead x 2 (3) Erythematous thin papules/macules with gritty scale on the left forehead . Pink keratotic patch on the left frontal hairline    Assessment & Plan   ACTINIC DAMAGE WITH PRECANCEROUS ACTINIC KERATOSES Counseling for Topical Chemotherapy Management: Patient exhibits: - Severe, confluent actinic changes with pre-cancerous actinic keratoses that is secondary to cumulative UV radiation exposure over time - Condition that is severe; chronic, not at goal. - diffuse scaly erythematous macules and papules with underlying dyspigmentation - Discussed Prescription "Field Treatment" topical Chemotherapy for Severe, Chronic Confluent Actinic Changes with Pre-Cancerous Actinic Keratoses Field treatment involves treatment of an entire area of skin that has confluent Actinic Changes (Sun/ Ultraviolet light damage) and  PreCancerous Actinic Keratoses by method of PhotoDynamic Therapy (PDT) and/or prescription Topical Chemotherapy agents such as 5-fluorouracil, 5-fluorouracil/calcipotriene, and/or imiquimod.  The purpose is to decrease the number of clinically evident and subclinical PreCancerous lesions to prevent progression to development of skin cancer by chemically destroying early precancer changes that may or may not be visible.  It has been shown to reduce the risk of developing skin cancer in the treated area. As a result of treatment, redness, scaling, crusting, and open sores may occur during treatment course. One or more than one of these methods may be used and may have to be used several times to control, suppress and eliminate the PreCancerous changes. Discussed treatment course, expected reaction, and possible side effects. - Recommend daily broad spectrum sunscreen SPF 30+ to sun-exposed areas, reapply every 2 hours as needed.  - Staying in the shade or wearing long sleeves, sun glasses (UVA+UVB protection) and wide brim hats (4-inch brim around the entire circumference of the hat) are also recommended. - Call for new or changing lesions.   - Start 5-fluorouracil cream apply twice a day for up to 2 weeks to affected areas including frontal scalp, forehead, temples.  Reviewed course of treatment and expected reaction.  Patient advised to expect inflammation and crusting and advised that erosions are possible.  Patient advised to be diligent with sun protection during and after treatment. Handout with details of how to apply medication and what to expect provided. Counseled to keep medication out of reach of children and pets.    SEBORRHEIC DERMATITIS Exam: mild erythema/scale at bilateral ears, pt c/o itching  Chronic and persistent condition  with duration or expected duration over one year. Condition is symptomatic/ bothersome to patient. Not currently at goal.   Seborrheic Dermatitis is a chronic  persistent rash characterized by pinkness and scaling most commonly of the mid face but also can occur on the scalp (dandruff), ears; mid chest, mid back and groin.  It tends to be exacerbated by stress and cooler weather.  People who have neurologic disease may experience new onset or exacerbation of existing seborrheic dermatitis.  The condition is not curable but treatable and can be controlled.  Treatment Plan: Recommend OTC 1% hydrocortisone cream 1-2 times daily to affected area prn for itchy rash   Xerosis with Pruritus  - diffuse xerotic patches - recommend gentle, hydrating skin care - gentle skin care handout given    NEOPLASM OF SKIN (3) left side burn Epidermal / dermal shaving  Lesion diameter (cm):  0.4 Informed consent: discussed and consent obtained   Timeout: patient name, date of birth, surgical site, and procedure verified   Procedure prep:  Patient was prepped and draped in usual sterile fashion Prep type:  Isopropyl alcohol Anesthesia: the lesion was anesthetized in a standard fashion   Anesthetic:  1% lidocaine w/ epinephrine 1-100,000 buffered w/ 8.4% NaHCO3 Instrument used: flexible razor blade   Hemostasis achieved with: pressure, aluminum chloride and electrodesiccation   Outcome: patient tolerated procedure well    Destruction of lesion  Destruction method: electrodesiccation and curettage   Informed consent: discussed and consent obtained   Curettage performed in three different directions: Yes   Electrodesiccation performed over the curetted area: Yes   Final wound size (cm):  0.8 Hemostasis achieved with:  electrodesiccation Outcome: patient tolerated procedure well with no complications   Post-procedure details: sterile dressing applied and wound care instructions given   Dressing type: petrolatum and bandage   Specimen 1 - Surgical pathology Differential Diagnosis: ISK R/O BCC Treated with EDC  Check Margins: No left temple Epidermal / dermal  shaving  Lesion diameter (cm):  0.4 Informed consent: discussed and consent obtained   Timeout: patient name, date of birth, surgical site, and procedure verified   Procedure prep:  Patient was prepped and draped in usual sterile fashion Prep type:  Isopropyl alcohol Anesthesia: the lesion was anesthetized in a standard fashion   Anesthetic:  1% lidocaine w/ epinephrine 1-100,000 buffered w/ 8.4% NaHCO3 Instrument used: flexible razor blade   Hemostasis achieved with: pressure, aluminum chloride and electrodesiccation   Outcome: patient tolerated procedure well    Destruction of lesion  Destruction method: electrodesiccation and curettage   Informed consent: discussed and consent obtained   Curettage performed in three different directions: Yes   Electrodesiccation performed over the curetted area: Yes   Margin per side (cm):  0.6 Hemostasis achieved with:  electrodesiccation Outcome: patient tolerated procedure well with no complications   Post-procedure details: sterile dressing applied and wound care instructions given   Dressing type: petrolatum and bandage   Specimen 2 - Surgical pathology Differential Diagnosis: R/O BCC Treated with EDC Check Margins: No nasal dorsum Epidermal / dermal shaving  Lesion diameter (cm):  0.4 Informed consent: discussed and consent obtained   Timeout: patient name, date of birth, surgical site, and procedure verified   Procedure prep:  Patient was prepped and draped in usual sterile fashion Prep type:  Isopropyl alcohol Anesthesia: the lesion was anesthetized in a standard fashion   Anesthetic:  1% lidocaine w/ epinephrine 1-100,000 buffered w/ 8.4% NaHCO3 Instrument used: flexible razor blade  Hemostasis achieved with: pressure, aluminum chloride and electrodesiccation   Outcome: patient tolerated procedure well    Destruction of lesion  Destruction method: electrodesiccation and curettage   Informed consent: discussed and consent  obtained   Curettage performed in three different directions: Yes   Electrodesiccation performed over the curetted area: Yes   Final wound size (cm):  0.5 Hemostasis achieved with:  electrodesiccation Outcome: patient tolerated procedure well with no complications   Post-procedure details: sterile dressing applied and wound care instructions given   Dressing type: petrolatum and bandage   Specimen 3 - Surgical pathology Differential Diagnosis: R/O BCC Treated with EDC  Check Margins: No AK (ACTINIC KERATOSIS) (3) left frontal hairline x 1, left forehead x 2 (3) Recheck L frontal hairline on f/up  Actinic keratoses are precancerous spots that appear secondary to cumulative UV radiation exposure/sun exposure over time. They are chronic with expected duration over 1 year. A portion of actinic keratoses will progress to squamous cell carcinoma of the skin. It is not possible to reliably predict which spots will progress to skin cancer and so treatment is recommended to prevent development of skin cancer.  Recommend daily broad spectrum sunscreen SPF 30+ to sun-exposed areas, reapply every 2 hours as needed.  Recommend staying in the shade or wearing long sleeves, sun glasses (UVA+UVB protection) and wide brim hats (4-inch brim around the entire circumference of the hat). Call for new or changing lesions.  Destruction of lesion - left frontal hairline x 1, left forehead x 2 (3)  Destruction method: cryotherapy   Informed consent: discussed and consent obtained   Lesion destroyed using liquid nitrogen: Yes   Region frozen until ice ball extended beyond lesion: Yes   Outcome: patient tolerated procedure well with no complications   Post-procedure details: wound care instructions given   Additional details:  Prior to procedure, discussed risks of blister formation, small wound, skin dyspigmentation, or rare scar following cryotherapy. Recommend Vaseline ointment to treated areas while healing.    Return in about 3 months (around 08/17/2023) for AKs .  I, Angelique Holm, CMA, am acting as scribe for Willeen Niece, MD .   Documentation: I have reviewed the above documentation for accuracy and completeness, and I agree with the above.  Willeen Niece, MD

## 2023-05-21 NOTE — Telephone Encounter (Signed)
Received another copy of form, need to confirm that patient come and completed a copy & that is has already been faxed.

## 2023-05-21 NOTE — Telephone Encounter (Signed)
This was handled. Mardella Layman had sent everything in. Copy we received was a duplicate.

## 2023-05-22 LAB — SURGICAL PATHOLOGY

## 2023-05-23 ENCOUNTER — Telehealth: Payer: Self-pay

## 2023-05-23 NOTE — Telephone Encounter (Signed)
Advised pt of bx results/sh ?

## 2023-05-23 NOTE — Telephone Encounter (Signed)
-----   Message from Willeen Niece sent at 05/22/2023  6:02 PM EST ----- 1. Skin, left side burn :       WELL DIFFERENTIATED SQUAMOUS CELL CARCINOMA   2. Skin, left temple :       BASAL CELL CARCINOMA, SUPERFICIAL AND NODULAR PATTERNS   3. Skin, nasal dorsum :       BASAL CELL CARCINOMA, NODULAR AND INFILTRATIVE PATTERNS    1. SCC skin cancer- already treated with EDC at time of biopsy 2 and 3. BCC skin cancer- both already treated with EDC at time of biopsy   - please call patient

## 2023-05-31 ENCOUNTER — Ambulatory Visit (INDEPENDENT_AMBULATORY_CARE_PROVIDER_SITE_OTHER): Payer: HMO | Admitting: Otolaryngology

## 2023-05-31 ENCOUNTER — Encounter (INDEPENDENT_AMBULATORY_CARE_PROVIDER_SITE_OTHER): Payer: Self-pay

## 2023-05-31 VITALS — BP 154/75 | HR 63 | Ht 66.0 in | Wt 170.0 lb

## 2023-05-31 DIAGNOSIS — H6123 Impacted cerumen, bilateral: Secondary | ICD-10-CM | POA: Diagnosis not present

## 2023-06-01 DIAGNOSIS — H6123 Impacted cerumen, bilateral: Secondary | ICD-10-CM | POA: Insufficient documentation

## 2023-06-01 NOTE — Progress Notes (Signed)
 Patient ID: Cody Willis, male   DOB: 1941-03-10, 83 y.o.   MRN: [id number]  Follow-up: Recurrent cerumen impaction  Procedure: Bilateral cerumen disimpaction.   Indication: Cerumen impaction, resulting in ear discomfort and conductive hearing loss.   Description: The patient is placed supine on the operating table. Under the operating microscope, the right ear canal is examined and is noted to be impacted with cerumen. The cerumen is carefully removed with a combination of suction catheters, cerumen curette, and alligator forceps. After the cerumen removal, the ear canal and tympanic membrane are noted to be normal. No middle ear effusion is noted. The same procedure is then repeated on the left side without exception. The patient tolerated the procedure well.  Follow-up care:  The patient is instructed not to use Q-tips to clean the ear canals. The patient will follow up in 6 months.

## 2023-06-28 ENCOUNTER — Telehealth: Payer: Self-pay | Admitting: Family Medicine

## 2023-06-28 NOTE — Telephone Encounter (Signed)
 Your appointment on 08/15/2023 will be a 3 month follow up with Dr Clent Ridges. Dr Clent Ridges is leaving the practice. You can call the office to schedule a Transfer of Care to either Dr Charlann Lange, Theressa Stamps, or [place]. You can also schedule at check out on . E2C2, If patient calls please schedule TOC.  E2C2 please schedule

## 2023-07-11 ENCOUNTER — Encounter: Payer: Self-pay | Admitting: *Deleted

## 2023-07-11 NOTE — Addendum Note (Signed)
 Addended by: Warden Fillers on: 07/11/2023 03:37 PM   Modules accepted: Orders

## 2023-07-29 ENCOUNTER — Other Ambulatory Visit: Payer: HMO

## 2023-08-15 ENCOUNTER — Ambulatory Visit: Payer: HMO | Admitting: Family Medicine

## 2023-08-27 ENCOUNTER — Ambulatory Visit (HOSPITAL_COMMUNITY)
Admission: EM | Admit: 2023-08-27 | Discharge: 2023-08-27 | Disposition: A | Discharge status: Home / Self Care | Attending: Nurse Practitioner | Admitting: Nurse Practitioner

## 2023-08-27 ENCOUNTER — Encounter (HOSPITAL_COMMUNITY): Payer: Self-pay | Admitting: Emergency Medicine

## 2023-08-27 ENCOUNTER — Other Ambulatory Visit: Payer: Self-pay

## 2023-08-27 DIAGNOSIS — Z23 Encounter for immunization: Secondary | ICD-10-CM | POA: Diagnosis not present

## 2023-08-27 DIAGNOSIS — S61210A Laceration without foreign body of right index finger without damage to nail, initial encounter: Secondary | ICD-10-CM

## 2023-08-27 MED ORDER — LIDOCAINE HCL 2 % IJ SOLN
INTRAMUSCULAR | Status: AC
  Filled 2023-08-27: qty 20

## 2023-08-27 MED ORDER — TETANUS-DIPHTH-ACELL PERTUSSIS 5-2.5-18.5 LF-MCG/0.5 IM SUSY
PREFILLED_SYRINGE | INTRAMUSCULAR | Status: AC
  Filled 2023-08-27: qty 0.5

## 2023-08-27 MED ORDER — TETANUS-DIPHTH-ACELL PERTUSSIS 5-2.5-18.5 LF-MCG/0.5 IM SUSY
0.5000 mL | PREFILLED_SYRINGE | Freq: Once | INTRAMUSCULAR | Status: AC
  Administered 2023-08-27: 0.5 mL via INTRAMUSCULAR

## 2023-08-27 NOTE — Discharge Instructions (Addendum)
 You were seen today for a cut which is also known as a laceration.  A laceration is a type of injury that can go through all layers of the skin and sometimes into the tissue underneath. Some cuts can heal on their own, but others need to be closed to help them heal properly and reduce the risk of infection.  Your cut was treated with stitches. Keep the dressing on for the next 24 hours and do not let it get wet. After 24 hours, you can remove the dressing. Clean the area gently once a day using warm water  and antibacterial soap. After cleaning, pat the area dry with a clean towel. Do not rub the wound, scratch it, or pick at it. Avoid using disinfectants, antiseptics, ointments, creams, or liquid medicines on the wound.  Keep the area covered with a non-stick dressing for the next couple of days. If you're at home and not doing anything that could get the wound dirty, you can leave it uncovered, as air helps promote healing.  Please return here or follow up with your healthcare provider in one week to have the stitches removed. If you notice increased redness, swelling, pus, or worsening pain, contact your provider right away.

## 2023-08-27 NOTE — ED Provider Notes (Signed)
 [facility]    CSN: [id number] Arrival date & time: 08/27/23  4098      History   Chief Complaint Chief Complaint  Patient presents with   Laceration    HPI Cody Willis is a 83 y.o. male.   Cody Willis is a 83 y.o. male with a laceration on his index finger that occurred earlier today while trimming branches on a tree. The incident happened around 9:30 AM when the patient was using electric shears. He reports that his arm got tired while holding the shears up, and as he lowered them, he accidentally cut his finger despite wearing gloves.  The patient describes the sensation in his finger as a burning and stinging pain, which has been increasing in intensity since the injury occurred. He denies any numbness or tingling in the affected finger. He takes 80 milligrams of baby aspirin  daily, but no anticoagulants.   The following portions of the patient's history were reviewed and updated as appropriate: allergies, current medications, past family history, past medical history, past social history, past surgical history, and problem list.      Past Medical History:  Diagnosis Date   Actinic keratosis    Arthritis    fingers   Basal cell carcinoma 02/10/2008   left top of shoulder   Basal cell carcinoma 08/14/2022   superficial, right upper temple, EDC 09/12/22   Basal cell carcinoma 11/14/2022   left upper forehead, EDC   Basal cell carcinoma 05/20/2023   left temple, EDC   Basal cell carcinoma 05/20/2023   Nasal dorsum, EDC   BCC (basal cell carcinoma) 10/21/2003   superficial spinal lower back   Carpal tunnel syndrome    Cataract    bilateral surgery repair   Chronic pancreatitis (HCC)    COPD (chronic obstructive pulmonary disease) (HCC)    COVID-19    Esophageal reflux    Gastric polyps    hyperplastic   Herpes zoster    Hiatal hernia    Hypertension    Melanoma (HCC) 10/21/2003   right mid abdomen Clarks Level II Breslows Measurement 0.42  mm   Other B-complex deficiencies    Pneumonia    Pure hypercholesterolemia    Squamous cell carcinoma of skin 05/20/2023   left side burn, EDC   Stroke (HCC) 01/16/2015   only on ASA    Patient Active Problem List   Diagnosis Date Noted   Impacted cerumen of both ears 06/01/2023   B12 deficiency 04/29/2023   Skin lesions 07/20/2022   S/P right unicompartmental knee replacement 05/01/2022   Hypertension 03/28/2022   Rash 03/12/2022   Right knee pain 01/12/2022   Coronary artery disease involving native coronary artery of native heart without angina pectoris 02/02/2021   Dyspnea 02/02/2021   Carotid bruit 02/02/2021   Pulmonary nodules 10/28/2020   Aortic atherosclerosis (HCC) 10/28/2020   Hx of melanoma excision 05/29/2018   Proteinuria 04/29/2017   Former smoker 10/31/2015   Preventative health care 07/26/2015   Advance directive discussed with patient 07/26/2015   Occipital neuralgia of right side 05/02/2015   History of CVA (cerebrovascular accident) 03/21/2015   Hyperlipidemia    COPD (chronic obstructive pulmonary disease) (HCC) 08/17/2014   CHRONIC PANCREATITIS 01/03/2010   ACID REFLUX DISEASE 07/02/2007    Past Surgical History:  Procedure Laterality Date   APPENDECTOMY     BACK SURGERY     Diskectomy between L4 and L5   CARPAL TUNNEL RELEASE Left 06/27/2021   Procedure:  CARPAL TUNNEL RELEASE LEFT;  Surgeon: Lyanne Sample, MD;  Location: [facility];  Service: Orthopedics;  Laterality: Left;  Regional with monitored anesthesia care   CATARACT EXTRACTION W/ INTRAOCULAR LENS  IMPLANT, BILATERAL  12/15 and 2/16   CHOLECYSTECTOMY     COLONOSCOPY  2011   MELANOMA EXCISION  ~2009   right upper abdomen   PARTIAL KNEE ARTHROPLASTY Right 05/01/2022   Procedure: UNICOMPARTMENTAL KNEE;  Surgeon: Saundra Curl, MD;  Location: [facility];  Service: Orthopedics;  Laterality: Right;       Home Medications    Prior to Admission medications   Medication Sig  Start Date End Date Taking? Authorizing Provider  acetaminophen  (TYLENOL ) 500 MG tablet Take 2 tablets (1,000 mg total) by mouth every 6 (six) hours as needed for mild pain or moderate pain. 05/01/22   Gawne, Meghan M, PA-C  albuterol  (VENTOLIN  HFA) 108 (90 Base) MCG/ACT inhaler Inhale 1-2 puffs into the lungs every 4 (four) hours as needed for wheezing or shortness of breath. 11/27/22   Wilhemena Harbour, NP  aspirin  EC 81 MG tablet Take 1 tablet (81 mg total) by mouth 2 (two) times daily. To prevent blood clots for 30 days after surgery. Patient taking differently: Take 81 mg by mouth daily. To prevent blood clots for 30 days after surgery. 05/01/22   Gawne, Meghan M, PA-C  cyanocobalamin  (VITAMIN B12) 1000 MCG/ML injection INJECT 1ML INTO THE MUSCLE EVERY 30 DAYS. PHARMACY-PLEASE PROVIDE APPROPRIATE NEEDLES AND SYRINGES 02/19/23   Tory Freiberg, NP  fluorouracil  (EFUDEX ) 5 % cream Apply to forehead and temples once daily x 3 weeks. 07/16/22   Elta Halter, MD  ketoconazole  (NIZORAL ) 2 % shampoo use 2 - 3 times weekly, apply topically to scalp , neck and chest, leave on skin for at least 5 minutes before rinsing off. 08/14/22   Artemio Larry, MD  Pancrelipase , Lip-Prot-Amyl, ([name] ) 25000-79000 units CPEP Take 2 capsules by mouth with breakfast, with lunch, and with evening meal. 02/19/23   Kennedy-Smith, Colleen M, NP  pantoprazole  (PROTONIX ) 40 MG tablet Take 1 tablet (40 mg total) by mouth every morning. 02/19/23   Kennedy-Smith, Colleen M, NP  predniSONE  (DELTASONE ) 20 MG tablet Take 2 tablets (40 mg total) by mouth daily with breakfast. 04/29/23   Kent Pear, MD  rosuvastatin  (CRESTOR ) 20 MG tablet Take 1 tablet (20 mg total) by mouth daily. 03/11/23   Hugh Madura, MD  triamcinolone  (KENALOG ) 0.025 % ointment Apply 1 Application topically daily as needed. 09/12/22   Artemio Larry, MD  valsartan  (DIOVAN ) 80 MG tablet Take 1 tablet (80 mg total) by mouth daily. 04/29/23    Kent Pear, MD    Family History Family History  Problem Relation Age of Onset   Stroke Mother    Diabetes Mother    Emphysema Father        smoker   Crohn's disease Sister    Colitis Brother    Colon cancer Neg Hx    Colon polyps Neg Hx    Esophageal cancer Neg Hx    Rectal cancer Neg Hx    Stomach cancer Neg Hx     Social History Social History   Tobacco Use   Smoking status: Former    Types: Cigarettes   Smokeless tobacco: Never  Vaping Use   Vaping status: Never Used  Substance Use Topics   Alcohol use: No    Alcohol/week: 0.0 standard drinks of alcohol   Drug use:  Never     Allergies   Plavix  [clopidogrel  bisulfate] and Sulfamethoxazole-trimethoprim   Review of Systems Review of Systems  Skin:  Positive for wound.  Neurological:  Negative for numbness.  All other systems reviewed and are negative.    Physical Exam Triage Vital Signs ED Triage Vitals  Encounter Vitals Group     BP 08/27/23 0943 (!) 135/54     Systolic BP Percentile --      Diastolic BP Percentile --      Pulse Rate 08/27/23 0943 64     Resp 08/27/23 0943 18     Temp 08/27/23 0943 97.7 F (36.5 C)     Temp Source 08/27/23 0943 Oral     SpO2 08/27/23 0943 98 %     Weight --      Height --      Head Circumference --      Peak Flow --      Pain Score 08/27/23 0941 6     Pain Loc --      Pain Education --      Exclude from Growth Chart --    No data found.  Updated Vital Signs BP (!) 135/54 (BP Location: Right Arm)   Pulse 64   Temp 97.7 F (36.5 C) (Oral)   Resp 18   SpO2 98%   Visual Acuity Right Eye Distance:   Left Eye Distance:   Bilateral Distance:    Right Eye Near:   Left Eye Near:    Bilateral Near:     Physical Exam Vitals reviewed.  Constitutional:      General: He is not in acute distress.    Appearance: Normal appearance. He is not toxic-appearing.  HENT:     Head: Normocephalic.     Mouth/Throat:     Mouth: Mucous membranes are moist.   Eyes:     Conjunctiva/sclera: Conjunctivae normal.  Cardiovascular:     Rate and Rhythm: Normal rate and regular rhythm.     Heart sounds: Normal heart sounds.  Pulmonary:     Effort: Pulmonary effort is normal.     Breath sounds: Normal breath sounds.  Musculoskeletal:        General: Normal range of motion.  Skin:    General: Skin is warm and dry.     Findings: Laceration present.  Neurological:     General: No focal deficit present.     Mental Status: He is alert and oriented to person, place, and time.      UC Treatments / Results  Labs (all labs ordered are listed, but only abnormal results are displayed) Labs Reviewed - No data to display  EKG   Radiology No results found.  Procedures Laceration Repair  Date/Time: 08/27/2023 1:53 PM  Performed by: Maryruth Sol, FNP Authorized by: Maryruth Sol, FNP   Consent:    Consent obtained:  Verbal   Consent given by:  Patient   Risks, benefits, and alternatives were discussed: yes     Risks discussed:  Infection, pain, poor cosmetic result, need for additional repair and poor wound healing Universal protocol:    Patient identity confirmed:  Verbally with patient and arm band Anesthesia:    Anesthesia method:  Local infiltration   Local anesthetic:  Lidocaine  2% w/o epi Laceration details:    Location:  Finger   Finger location:  R index finger Pre-procedure details:    Preparation:  Patient was prepped and draped in usual sterile fashion Treatment:  Amount of cleaning:  Standard   Debridement:  None Skin repair:    Repair method:  Sutures   Suture size:  4-0   Suture material:  Prolene   Suture technique:  Simple interrupted   Number of sutures:  5 Approximation:    Approximation:  Close Repair type:    Repair type:  Simple Post-procedure details:    Dressing:  Non-adherent dressing   Procedure completion:  Tolerated well, no immediate complications  (including critical care  time)   Medications Ordered in UC Medications  Tdap (BOOSTRIX) injection 0.5 mL (has no administration in time range)    Initial Impression / Assessment and Plan / UC Course  I have reviewed the triage vital signs and the nursing notes.  Pertinent labs & imaging results that were available during my care of the patient were reviewed by me and considered in my medical decision making (see chart for details).    The patient presents with a laceration to the distal aspect of the right index finger. The wound was evaluated and found to be appropriate for primary closure. The area was cleansed, and the laceration was repaired with sutures as above. Tdap vaccination was updated. Wound care instructions were reviewed, including keeping the area clean and dry, and monitoring for signs of infection such as redness, swelling, drainage, or increased pain. The patient was advised to return to urgent care in one week for suture removal and to seek immediate medical attention if any concerning symptoms develop.  Today's evaluation has revealed no signs of a dangerous process. Discussed diagnosis with patient and/or guardian. Patient and/or guardian aware of their diagnosis, possible red flag symptoms to watch out for and need for close follow up. Patient and/or guardian understands verbal and written discharge instructions. Patient and/or guardian comfortable with plan and disposition.  Patient and/or guardian has a clear mental status at this time, good insight into illness (after discussion and teaching) and has clear judgment to make decisions regarding their care  Documentation was completed with the aid of voice recognition software. Transcription may contain typographical errors.  Final Clinical Impressions(s) / UC Diagnoses   Final diagnoses:  Laceration of right index finger without foreign body without damage to nail, initial encounter     Discharge Instructions      You were seen today for  a cut which is also known as a laceration.  A laceration is a type of injury that can go through all layers of the skin and sometimes into the tissue underneath. Some cuts can heal on their own, but others need to be closed to help them heal properly and reduce the risk of infection.  Your cut was treated with stitches. Keep the dressing on for the next 24 hours and do not let it get wet. After 24 hours, you can remove the dressing. Clean the area gently once a day using warm water and antibacterial soap. After cleaning, pat the area dry with a clean towel. Do not rub the wound, scratch it, or pick at it. Avoid using disinfectants, antiseptics, ointments, creams, or liquid medicines on the wound.  Keep the area covered with a non-stick dressing for the next couple of days. If you're at home and not doing anything that could get the wound dirty, you can leave it uncovered, as air helps promote healing.  Please return here or follow up with your healthcare provider in one week to have the stitches removed. If you notice increased redness, swelling, pus,  or worsening pain, contact your provider right away.   ED Prescriptions   None    PDMP not reviewed this encounter.   Maryruth Sol, [place] 08/27/23 1359

## 2023-08-27 NOTE — ED Triage Notes (Signed)
 Battery operated Counsellor was being used.  Patient almost dropped hedge trimmer, grabbed equipment.  Patient reports he was wearing gloves.  Laceration to pad of right finger tip.  Patient does not know when last tetanus was recived

## 2023-09-03 ENCOUNTER — Encounter: Payer: Self-pay | Admitting: Family Medicine

## 2023-09-03 NOTE — Progress Notes (Unsigned)
     Ashante Snelling T. Len Azeez, MD, CAQ Sports Medicine [facility] at [facility] [address] [place], [address]  Phone: [phone]  FAX: [phone]  Cody Willis - 83 y.o. male  MRN [id number]  Date of Birth: 11/16/1940  Date: 09/05/2023  PCP: Pcp, No  Referral: No ref. provider found  No chief complaint on file.  Subjective:   Cody Willis is a 83 y.o. very pleasant male patient with There is no height or weight on file to calculate BMI. who presents with the following:  Patient presents as a new patient in transfer of care from [facility].  He previously was a patient of Dr. Aletha Hutching, and years ago he was a patient of Dr. Joelle Musca.  He has a complicated medical history including significant history of prior stroke, coronary disease, COPD. He also has a history of hypertension, hyperlipidemia, remote history of melanoma.  Coronary disease, he does see Dr. Renna Cary.  History of stroke, he does see Dr.Penumalli from [facility] neurology.  He is taking an aspirin  only.  Previously, he has seen Eulas Hick as well as Dr. Bertrum Brodie from pulmonology. They previously started him on Spiriva  but he stopped this on his own.    Review of Systems is noted in the HPI, as appropriate  Objective:   There were no vitals taken for this visit.  GEN: No acute distress; alert,appropriate. PULM: Breathing comfortably in no respiratory distress PSYCH: Normally interactive.   Laboratory and Imaging Data:  Assessment and Plan:   ***

## 2023-09-04 ENCOUNTER — Ambulatory Visit (HOSPITAL_COMMUNITY)

## 2023-09-05 ENCOUNTER — Encounter: Payer: Self-pay | Admitting: Family Medicine

## 2023-09-05 ENCOUNTER — Ambulatory Visit (INDEPENDENT_AMBULATORY_CARE_PROVIDER_SITE_OTHER): Admitting: Family Medicine

## 2023-09-05 VITALS — BP 140/64 | HR 64 | Temp 98.2°F | Ht 66.0 in | Wt 173.5 lb

## 2023-09-05 DIAGNOSIS — Z79899 Other long term (current) drug therapy: Secondary | ICD-10-CM

## 2023-09-05 DIAGNOSIS — I2510 Atherosclerotic heart disease of native coronary artery without angina pectoris: Secondary | ICD-10-CM | POA: Diagnosis not present

## 2023-09-05 DIAGNOSIS — J431 Panlobular emphysema: Secondary | ICD-10-CM

## 2023-09-05 DIAGNOSIS — E538 Deficiency of other specified B group vitamins: Secondary | ICD-10-CM

## 2023-09-05 DIAGNOSIS — I700 Atherosclerosis of aorta: Secondary | ICD-10-CM

## 2023-09-05 DIAGNOSIS — Z87891 Personal history of nicotine dependence: Secondary | ICD-10-CM

## 2023-09-05 DIAGNOSIS — Z7984 Long term (current) use of oral hypoglycemic drugs: Secondary | ICD-10-CM

## 2023-09-05 DIAGNOSIS — Z8673 Personal history of transient ischemic attack (TIA), and cerebral infarction without residual deficits: Secondary | ICD-10-CM | POA: Diagnosis not present

## 2023-09-05 DIAGNOSIS — R7303 Prediabetes: Secondary | ICD-10-CM

## 2023-09-05 DIAGNOSIS — I10 Essential (primary) hypertension: Secondary | ICD-10-CM

## 2023-09-05 DIAGNOSIS — E559 Vitamin D deficiency, unspecified: Secondary | ICD-10-CM | POA: Diagnosis not present

## 2023-09-05 DIAGNOSIS — E782 Mixed hyperlipidemia: Secondary | ICD-10-CM

## 2023-09-05 DIAGNOSIS — Z85820 Personal history of malignant melanoma of skin: Secondary | ICD-10-CM

## 2023-09-05 LAB — CBC WITH DIFFERENTIAL/PLATELET
Basophils Absolute: 0.1 10*3/uL (ref 0.0–0.1)
Basophils Relative: 1.1 % (ref 0.0–3.0)
Eosinophils Absolute: 0.2 10*3/uL (ref 0.0–0.7)
Eosinophils Relative: 2.2 % (ref 0.0–5.0)
HCT: 40.8 % (ref 39.0–52.0)
Hemoglobin: 13.8 g/dL (ref 13.0–17.0)
Lymphocytes Relative: 20.1 % (ref 12.0–46.0)
Lymphs Abs: 1.6 10*3/uL (ref 0.7–4.0)
MCHC: 33.9 g/dL (ref 30.0–36.0)
MCV: 89 fl (ref 78.0–100.0)
Monocytes Absolute: 0.8 10*3/uL (ref 0.1–1.0)
Monocytes Relative: 9.9 % (ref 3.0–12.0)
Neutro Abs: 5.4 10*3/uL (ref 1.4–7.7)
Neutrophils Relative %: 66.7 % (ref 43.0–77.0)
Platelets: 163 10*3/uL (ref 150.0–400.0)
RBC: 4.59 Mil/uL (ref 4.22–5.81)
RDW: 14.6 % (ref 11.5–15.5)
WBC: 8.1 10*3/uL (ref 4.0–10.5)

## 2023-09-05 LAB — HEPATIC FUNCTION PANEL
ALT: 14 U/L (ref 0–53)
AST: 20 U/L (ref 0–37)
Albumin: 4.5 g/dL (ref 3.5–5.2)
Alkaline Phosphatase: 58 U/L (ref 39–117)
Bilirubin, Direct: 0.2 mg/dL (ref 0.0–0.3)
Total Bilirubin: 0.8 mg/dL (ref 0.2–1.2)
Total Protein: 6.3 g/dL (ref 6.0–8.3)

## 2023-09-05 LAB — LIPID PANEL
Cholesterol: 112 mg/dL (ref 0–200)
HDL: 48 mg/dL (ref >39.00)
LDL Cholesterol: 37 mg/dL (ref 0–99)
NonHDL: 63.52
Total CHOL/HDL Ratio: 2
Triglycerides: 133 mg/dL (ref 0.0–149.0)
VLDL: 26.6 mg/dL (ref 0.0–40.0)

## 2023-09-05 LAB — VITAMIN D 25 HYDROXY (VIT D DEFICIENCY, FRACTURES): VITD: 31.61 ng/mL (ref 30.00–100.00)

## 2023-09-05 LAB — BASIC METABOLIC PANEL WITH GFR
BUN: 20 mg/dL (ref 6–23)
CO2: 31 meq/L (ref 19–32)
Calcium: 9.1 mg/dL (ref 8.4–10.5)
Chloride: 105 meq/L (ref 96–112)
Creatinine, Ser: 1.07 mg/dL (ref 0.40–1.50)
GFR: 64.74 mL/min (ref >60.00)
Glucose, Bld: 82 mg/dL (ref 70–99)
Potassium: 4.3 meq/L (ref 3.5–5.1)
Sodium: 142 meq/L (ref 135–145)

## 2023-09-05 LAB — HEMOGLOBIN A1C: Hgb A1c MFr Bld: 5.5 % (ref 4.6–6.5)

## 2023-09-05 NOTE — Patient Instructions (Signed)
 Switch to oral B12 tablets:  1,000 micrograms each day

## 2023-09-06 ENCOUNTER — Encounter: Payer: Self-pay | Admitting: Family Medicine

## 2023-09-06 ENCOUNTER — Ambulatory Visit: Payer: Self-pay | Admitting: Family Medicine

## 2023-09-09 ENCOUNTER — Ambulatory Visit: Payer: HMO | Admitting: Dermatology

## 2023-09-09 ENCOUNTER — Encounter: Payer: Self-pay | Admitting: Dermatology

## 2023-09-09 DIAGNOSIS — D692 Other nonthrombocytopenic purpura: Secondary | ICD-10-CM

## 2023-09-09 DIAGNOSIS — Z85828 Personal history of other malignant neoplasm of skin: Secondary | ICD-10-CM

## 2023-09-09 DIAGNOSIS — W908XXA Exposure to other nonionizing radiation, initial encounter: Secondary | ICD-10-CM

## 2023-09-09 DIAGNOSIS — L570 Actinic keratosis: Secondary | ICD-10-CM | POA: Diagnosis not present

## 2023-09-09 DIAGNOSIS — L578 Other skin changes due to chronic exposure to nonionizing radiation: Secondary | ICD-10-CM

## 2023-09-09 DIAGNOSIS — Z79899 Other long term (current) drug therapy: Secondary | ICD-10-CM

## 2023-09-09 DIAGNOSIS — Z7189 Other specified counseling: Secondary | ICD-10-CM

## 2023-09-09 DIAGNOSIS — Z5111 Encounter for antineoplastic chemotherapy: Secondary | ICD-10-CM | POA: Diagnosis not present

## 2023-09-09 DIAGNOSIS — L738 Other specified follicular disorders: Secondary | ICD-10-CM

## 2023-09-09 NOTE — Progress Notes (Signed)
 Follow-Up Visit   Subjective  Cody Willis is a 83 y.o. male who presents for the following: Aks 64m f/u, L frontal hairline, used 23fu bid x 2 weeks to scalp, forehead, temples, with good reactions, Recheck SCC L sideburn bx and EDC 05/20/23, recheck BCCs L temple, nasal dorsum bx and EDC 05/20/23, check spots L temple, R arm The patient has spots, moles and lesions to be evaluated, some may be new or changing and the patient may have concern these could be cancer.   The following portions of the chart were reviewed this encounter and updated as appropriate: medications, allergies, medical history  Review of Systems:  No other skin or systemic complaints except as noted in HPI or Assessment and Plan.  Objective  Well appearing patient in no apparent distress; mood and affect are within normal limits.   A focused examination was performed of the following areas: Face, scalp, arms, ears  Relevant exam findings are noted in the Assessment and Plan.  L temple x 1, L frontal hairline x 7, R temple x 3, L antehelix x 1, L helix x 4 (16) Pink scaly macules R forearm x 1 Keratotic papule  Assessment & Plan   HISTORY OF BASAL CELL CARCINOMA OF THE SKIN - No evidence of recurrence today-L temple, nasal dorsum - Recommend regular full body skin exams - Recommend daily broad spectrum sunscreen SPF 30+ to sun-exposed areas, reapply every 2 hours as needed.  - Call if any new or changing lesions are noted between office visits    HISTORY OF SQUAMOUS CELL CARCINOMA OF THE SKIN - No evidence of recurrence today- L sideburn - Recommend regular full body skin exams - Recommend daily broad spectrum sunscreen SPF 30+ to sun-exposed areas, reapply every 2 hours as needed.  - Call if any new or changing lesions are noted between office visits   AK (ACTINIC KERATOSIS) (16) L temple x 1, L frontal hairline x 7, R temple x 3, L antehelix x 1, L helix x 4 (16) Actinic keratoses are  precancerous spots that appear secondary to cumulative UV radiation exposure/sun exposure over time. They are chronic with expected duration over 1 year. A portion of actinic keratoses will progress to squamous cell carcinoma of the skin. It is not possible to reliably predict which spots will progress to skin cancer and so treatment is recommended to prevent development of skin cancer.  Recommend daily broad spectrum sunscreen SPF 30+ to sun-exposed areas, reapply every 2 hours as needed.  Recommend staying in the shade or wearing long sleeves, sun glasses (UVA+UVB protection) and wide brim hats (4-inch brim around the entire circumference of the hat). Call for new or changing lesions. Destruction of lesion - L temple x 1, L frontal hairline x 7, R temple x 3, L antehelix x 1, L helix x 4 (16)  Destruction method: cryotherapy   Informed consent: discussed and consent obtained   Lesion destroyed using liquid nitrogen: Yes   Region frozen until ice ball extended beyond lesion: Yes   Outcome: patient tolerated procedure well with no complications   Post-procedure details: wound care instructions given   Additional details:  Prior to procedure, discussed risks of blister formation, small wound, skin dyspigmentation, or rare scar following cryotherapy. Recommend Vaseline ointment to treated areas while healing.  HYPERTROPHIC ACTINIC KERATOSIS R forearm x 1 Destruction of lesion - R forearm x 1  Destruction method: cryotherapy   Informed consent: discussed and consent obtained  Lesion destroyed using liquid nitrogen: Yes   Region frozen until ice ball extended beyond lesion: Yes   Outcome: patient tolerated procedure well with no complications   Post-procedure details: wound care instructions given   Additional details:  Prior to procedure, discussed risks of blister formation, small wound, skin dyspigmentation, or rare scar following cryotherapy. Recommend Vaseline ointment to treated areas  while healing.   ACTINIC DAMAGE WITH PRECANCEROUS ACTINIC KERATOSES Counseling for Topical Chemotherapy Management: Patient exhibits: - Severe, confluent actinic changes with pre-cancerous actinic keratoses that is secondary to cumulative UV radiation exposure over time - Condition that is severe; chronic, not at goal. - diffuse scaly erythematous macules and papules with underlying dyspigmentation - Discussed Prescription "Field Treatment" topical Chemotherapy for Severe, Chronic Confluent Actinic Changes with Pre-Cancerous Actinic Keratoses Field treatment involves treatment of an entire area of skin that has confluent Actinic Changes (Sun/ Ultraviolet light damage) and PreCancerous Actinic Keratoses by method of PhotoDynamic Therapy (PDT) and/or prescription Topical Chemotherapy agents such as 5-fluorouracil , 5-fluorouracil /calcipotriene, and/or imiquimod.  The purpose is to decrease the number of clinically evident and subclinical PreCancerous lesions to prevent progression to development of skin cancer by chemically destroying early precancer changes that may or may not be visible.  It has been shown to reduce the risk of developing skin cancer in the treated area. As a result of treatment, redness, scaling, crusting, and open sores may occur during treatment course. One or more than one of these methods may be used and may have to be used several times to control, suppress and eliminate the PreCancerous changes. Discussed treatment course, expected reaction, and possible side effects. - Recommend daily broad spectrum sunscreen SPF 30+ to sun-exposed areas, reapply every 2 hours as needed.  - Staying in the shade or wearing long sleeves, sun glasses (UVA+UVB protection) and wide brim hats (4-inch brim around the entire circumference of the hat) are also recommended. - Call for new or changing lesions.  -- Start 5-fluorouracil  cream twice a day for 7-10 days to affected areas including forehead to  hairline.  Reviewed course of treatment and expected reaction.  Patient advised to expect inflammation and crusting and advised that erosions are possible.  Patient advised to be diligent with sun protection during and after treatment. Handout with details of how to apply medication and what to expect provided. Counseled to keep medication out of reach of children and pets.   Purpura - Chronic; persistent and recurrent.  Treatable, but not curable. Arms  - Violaceous macules and patches - Benign - Related to trauma, age, sun damage and/or use of blood thinners, chronic use of topical and/or oral steroids - Observe - Can use OTC arnica containing moisturizer such as Dermend Bruise Formula if desired - Call for worsening or other concerns  Sebaceous Hyperplasia - Small yellow papules with a central dell - Benign-appearing - Observe. Call for changes.   Return in about 6 months (around 03/10/2024) for AK f/u.  I, Rollie Clipper, RMA, am acting as scribe for Artemio Larry, MD .   Documentation: I have reviewed the above documentation for accuracy and completeness, and I agree with the above.  Artemio Larry, MD

## 2023-09-09 NOTE — Patient Instructions (Addendum)
 -Once areas on face heal - Start 5-fluorouracil  cream twice a day for 7-10 days to affected areas including forehead to hairline  Cryotherapy Aftercare  Wash gently with soap and water everyday.   Apply Vaseline and Band-Aid daily until healed.     Due to recent changes in healthcare laws, you may see results of your pathology and/or laboratory studies on MyChart before the doctors have had a chance to review them. We understand that in some cases there may be results that are confusing or concerning to you. Please understand that not all results are received at the same time and often the doctors may need to interpret multiple results in order to provide you with the best plan of care or course of treatment. Therefore, we ask that you please give us  2 business days to thoroughly review all your results before contacting the office for clarification. Should we see a critical lab result, you will be contacted sooner.   If You Need Anything After Your Visit  If you have any questions or concerns for your doctor, please call our main line at [phone] and press option 4 to reach your doctor's medical assistant. If no one answers, please leave a voicemail as directed and we will return your call as soon as possible. Messages left after 4 pm will be answered the following business day.   You may also send us  a message via MyChart. We typically respond to MyChart messages within 1-2 business days.  For prescription refills, please ask your pharmacy to contact our office. Our fax number is [phone].  If you have an urgent issue when the clinic is closed that cannot wait until the next business day, you can page your doctor at the number below.    Please note that while we do our best to be available for urgent issues outside of office hours, we are not available 24/7.   If you have an urgent issue and are unable to reach us , you may choose to seek medical care at your doctor's office, retail  clinic, urgent care center, or emergency room.  If you have a medical emergency, please immediately call 911 or go to the emergency department.  Pager Numbers  - Dr. Bary Likes: [phone]  - Dr. Annette Barters: [phone]  - Dr. Felipe Horton: [phone]   In the event of inclement weather, please call our main line at [phone] for an update on the status of any delays or closures.  Dermatology Medication Tips: Please keep the boxes that topical medications come in in order to help keep track of the instructions about where and how to use these. Pharmacies typically print the medication instructions only on the boxes and not directly on the medication tubes.   If your medication is too expensive, please contact our office at [phone] option 4 or send us  a message through MyChart.   We are unable to tell what your co-pay for medications will be in advance as this is different depending on your insurance coverage. However, we may be able to find a substitute medication at lower cost or fill out paperwork to get insurance to cover a needed medication.   If a prior authorization is required to get your medication covered by your insurance company, please allow us  1-2 business days to complete this process.  Drug prices often vary depending on where the prescription is filled and some pharmacies may offer cheaper prices.  The website www.goodrx.com contains coupons for medications through different pharmacies. The prices here  do not account for what the cost may be with help from insurance (it may be cheaper with your insurance), but the website can give you the price if you did not use any insurance.  - You can print the associated coupon and take it with your prescription to the pharmacy.  - You may also stop by our office during regular business hours and pick up a GoodRx coupon card.  - If you need your prescription sent electronically to a different pharmacy, notify our office through [facility] or by phone at [phone] option 4.     Si Usted Necesita Algo Despus de Su Visita  Tambin puede enviarnos un mensaje a travs de Clinical cytogeneticist. Por lo general respondemos a los mensajes de MyChart en el transcurso de 1 a 2 das hbiles.  Para renovar recetas, por favor pida a su farmacia que se ponga en contacto con nuestra oficina. Franz Jacks de fax es [place] [phone].  Si tiene un asunto urgente cuando la clnica est cerrada y que no puede esperar hasta el siguiente da hbil, puede llamar/localizar a su doctor(a) al nmero que aparece a continuacin.   Por favor, tenga en cuenta que aunque hacemos todo lo posible para estar disponibles para asuntos urgentes fuera del horario de [place], no estamos disponibles las 24 horas del da, los 7 [address]  [address] de la [place].   Si tiene un problema urgente y no puede comunicarse con nosotros, puede optar por buscar atencin mdica  en el consultorio de su doctor(a), en una clnica privada, en un centro de atencin urgente o en una sala de emergencias.  Si tiene Engineer, drilling, por favor llame inmediatamente al 911 o vaya a la sala de emergencias.  Nmeros de bper  - Dr. Bary Likes: [phone]  - Dra. Annette Barters: [phone]  - Dr. Felipe Horton: [phone]   En caso de inclemencias del tiempo, por favor llame a Lajuan Pila principal al [phone] para una actualizacin sobre el [place] de cualquier retraso o cierre.  Consejos para la medicacin en dermatologa: Por favor, guarde las cajas en las que vienen los medicamentos de uso tpico para ayudarle a seguir las instrucciones sobre dnde y cmo usarlos. Las farmacias generalmente imprimen las instrucciones del medicamento slo en las cajas y no directamente en los tubos del [place].   Si su medicamento es muy caro, por favor, pngase en contacto con Bettyjane Brunet llamando al [phone] y presione la opcin 4 o envenos un mensaje a travs de Clinical cytogeneticist.   No podemos  decirle cul ser su copago por los medicamentos por adelantado ya que esto es diferente dependiendo de la cobertura de su seguro. Sin embargo, es posible que podamos encontrar un medicamento sustituto a Audiological scientist un formulario para que el seguro cubra el medicamento que se considera necesario.   Si se requiere una autorizacin previa para que su compaa de seguros [place] su medicamento, por favor permtanos de 1 a 2 das hbiles para completar este proceso.  Los precios de los medicamentos varan con frecuencia dependiendo del Environmental consultant de dnde se surte la receta y alguna farmacias pueden ofrecer precios ms baratos.  El sitio web www.goodrx.com tiene cupones para medicamentos de Health and safety inspector. Los precios aqu no tienen en cuenta lo que podra costar con la ayuda del seguro (puede ser ms barato con su seguro), pero el sitio web puede darle el precio si no utiliz Tourist information centre manager.  - Puede imprimir el cupn correspondiente y llevarlo con su receta a la  farmacia.  - Tambin puede pasar por nuestra oficina durante el horario de atencin regular y Education officer, museum una tarjeta de cupones de GoodRx.  - Si necesita que su receta se enve electrnicamente a una farmacia diferente, informe a nuestra oficina a travs de MyChart de [place] o por telfono llamando al [phone] y presione la opcin 4.

## 2023-09-24 DIAGNOSIS — Z961 Presence of intraocular lens: Secondary | ICD-10-CM | POA: Diagnosis not present

## 2023-09-24 DIAGNOSIS — H353133 Nonexudative age-related macular degeneration, bilateral, advanced atrophic without subfoveal involvement: Secondary | ICD-10-CM | POA: Diagnosis not present

## 2023-10-21 ENCOUNTER — Ambulatory Visit
Admission: EM | Admit: 2023-10-21 | Discharge: 2023-10-21 | Disposition: A | Discharge status: Home / Self Care | Attending: Emergency Medicine | Admitting: Emergency Medicine

## 2023-10-21 ENCOUNTER — Encounter: Payer: Self-pay | Admitting: Emergency Medicine

## 2023-10-21 DIAGNOSIS — T63441A Toxic effect of venom of bees, accidental (unintentional), initial encounter: Secondary | ICD-10-CM | POA: Diagnosis not present

## 2023-10-21 DIAGNOSIS — S21132A Puncture wound without foreign body of left front wall of thorax without penetration into thoracic cavity, initial encounter: Secondary | ICD-10-CM

## 2023-10-21 DIAGNOSIS — S41132A Puncture wound without foreign body of left upper arm, initial encounter: Secondary | ICD-10-CM

## 2023-10-21 DIAGNOSIS — S41131A Puncture wound without foreign body of right upper arm, initial encounter: Secondary | ICD-10-CM | POA: Diagnosis not present

## 2023-10-21 MED ORDER — LIDOCAINE 5 % EX OINT: 1.0000 | TOPICAL_OINTMENT | CUTANEOUS | 0 refills | Status: AC | PRN

## 2023-10-21 MED ORDER — KETOROLAC TROMETHAMINE 30 MG/ML IJ SOLN
30.0000 mg | Freq: Once | INTRAMUSCULAR | Status: AC
  Administered 2023-10-21: 30 mg via INTRAMUSCULAR

## 2023-10-21 MED ORDER — PREDNISONE 10 MG (21) PO TBPK: ORAL_TABLET | Freq: Every day | ORAL | 0 refills | Status: DC

## 2023-10-21 NOTE — Discharge Instructions (Signed)
 Your evaluated for your bee stings, having localized reactions which are why you are experiencing pain and swelling  You have been given an injection of Toradol  to help reduce pain and swelling and ideally will see improvement within an hour  Start tomorrow take prednisone  every morning with food as directed to continue this process, may take Tylenol  additionally as needed  May apply lidocaine  cream to help soothe the affected areas as needed  May apply ice over the affected areas as needed  If  if your symptoms continue to persist despite use of medicine please follow-up with your primary doctor  At any point if you begin to have difficulty breathing please go to the nearest emergency department

## 2023-10-21 NOTE — ED Triage Notes (Signed)
 Patient reports that he got stung by yellow jackets on forehead, bilateral arms and left side about 3 hours ago.. Patient complains of pain. Rates pain 8/10

## 2023-10-21 NOTE — ED Provider Notes (Signed)
 [facility]    CSN: [id number] Arrival date & time: 10/21/23  1443      History   Chief Complaint Chief Complaint  Patient presents with   Insect Bite    HPI Cody Willis is a 83 y.o. male.   Patient presents for evaluation of yellowjacket stings present to the face, arms and the left side of his chest beginning approximately 3 hours ago.  Endorses constant severe pain and burning.  Has attempted use of Tylenol  which has been ineffective.   Past Medical History:  Diagnosis Date   B12 deficiency    Basal cell carcinoma 02/10/2008   left top of shoulder   Basal cell carcinoma 08/14/2022   superficial, right upper temple, [facility] 09/12/22   Basal cell carcinoma 11/14/2022   left upper forehead, [facility]   Basal cell carcinoma 05/20/2023   left temple, [facility]   Basal cell carcinoma 05/20/2023   Nasal dorsum, [facility]   BCC (basal cell carcinoma) 10/21/2003   superficial spinal lower back   Chronic pancreatitis (HCC)    COPD (chronic obstructive pulmonary disease) (HCC)    Esophageal reflux    Hiatal hernia    Hypertension    Melanoma (HCC) 10/21/2003   right mid abdomen Clarks Level II Breslows Measurement 0.42 mm   Pure hypercholesterolemia    Squamous cell carcinoma of skin 05/20/2023   left side burn, [facility]   Stroke (HCC) 01/16/2015   only on ASA   Vitamin D  deficiency     Patient Active Problem List   Diagnosis Date Noted   B12 deficiency 04/29/2023   Essential hypertension 03/28/2022   Coronary artery disease involving native coronary artery of native heart without angina pectoris 02/02/2021   Carotid bruit 02/02/2021   Pulmonary nodules 10/28/2020   Aortic atherosclerosis (HCC) 10/28/2020   Hx of melanoma excision 05/29/2018   Proteinuria 04/29/2017   Former smoker 10/31/2015   Advance directive discussed with patient 07/26/2015   Occipital neuralgia of right side 05/02/2015   History of CVA (cerebrovascular accident) 03/21/2015   Mixed hyperlipidemia     COPD (chronic obstructive pulmonary disease) (HCC) 08/17/2014   CHRONIC PANCREATITIS 01/03/2010   ACID REFLUX DISEASE 07/02/2007    Past Surgical History:  Procedure Laterality Date   APPENDECTOMY     BACK SURGERY     Diskectomy between L4 and L5   CARPAL TUNNEL RELEASE Left 06/27/2021   Procedure: CARPAL TUNNEL RELEASE LEFT;  Surgeon: Murrell Kuba, MD;  Location: [facility];  Service: Orthopedics;  Laterality: Left;  Regional with monitored anesthesia care   CATARACT EXTRACTION W/ INTRAOCULAR LENS  IMPLANT, BILATERAL  12/15 and 2/16   CHOLECYSTECTOMY     MELANOMA EXCISION  ~2009   right upper abdomen   PARTIAL KNEE ARTHROPLASTY Right 05/01/2022   Procedure: UNICOMPARTMENTAL KNEE;  Surgeon: Beverley Evalene BIRCH, MD;  Location: [facility];  Service: Orthopedics;  Laterality: Right;       Home Medications    Prior to Admission medications   Medication Sig Start Date End Date Taking? Authorizing Provider  lidocaine  (XYLOCAINE ) 5 % ointment Apply 1 Application topically as needed. 10/21/23  Yes Aryianna Earwood R, NP  predniSONE  (STERAPRED UNI-PAK 21 TAB) 10 MG (21) TBPK tablet Take by mouth daily. Take 6 tabs by mouth daily  for 1 days, then 5 tabs for 1 days, then 4 tabs for 1 days, then 3 tabs for 1 days, 2 tabs for 1 days, then 1 tab by mouth daily for  1 days 10/21/23  Yes Luiz Trumpower, Shelba SAUNDERS, NP  acetaminophen  (TYLENOL ) 500 MG tablet Take 2 tablets (1,000 mg total) by mouth every 6 (six) hours as needed for mild pain or moderate pain. 05/01/22   Gawne, Meghan M, PA-C  albuterol  (VENTOLIN  HFA) 108 (90 Base) MCG/ACT inhaler Inhale 1-2 puffs into the lungs every 4 (four) hours as needed for wheezing or shortness of breath. 11/27/22   Chandra Harlene LABOR, NP  aspirin  EC 81 MG tablet Take 81 mg by mouth daily. Swallow whole.    [provider]  cholecalciferol (VITAMIN D3) 25 MCG (1000 UNIT) tablet Take 1,000 Units by mouth daily.    [provider]  cyanocobalamin   (VITAMIN B12) 1000 MCG/ML injection INJECT 1ML INTO THE MUSCLE EVERY 30 DAYS. PHARMACY-PLEASE PROVIDE APPROPRIATE NEEDLES AND SYRINGES 02/19/23   Cara Elida HERO, NP  ketoconazole  (NIZORAL ) 2 % shampoo use 2 - 3 times weekly, apply topically to scalp , neck and chest, leave on skin for at least 5 minutes before rinsing off. 08/14/22   Jackquline Sawyer, MD  Pancrelipase , Lip-Prot-Amyl, ([name] ) 25000-79000 units CPEP Take 2 capsules by mouth with breakfast, with lunch, and with evening meal. 02/19/23   Cara Elida HERO, NP  pantoprazole  (PROTONIX ) 40 MG tablet Take 1 tablet (40 mg total) by mouth every morning. 02/19/23   Kennedy-Smith, Colleen M, NP  rosuvastatin  (CRESTOR ) 20 MG tablet Take 1 tablet (20 mg total) by mouth daily. 03/11/23   Jeffrie Oneil BROCKS, MD  triamcinolone  (KENALOG ) 0.025 % ointment Apply 1 Application topically daily as needed. 09/12/22   Jackquline Sawyer, MD  valsartan  (DIOVAN ) 80 MG tablet Take 1 tablet (80 mg total) by mouth daily. 04/29/23   Maribeth Camellia MATSU, MD    Family History Family History  Problem Relation Age of Onset   Stroke Mother    Diabetes Mother    Emphysema Father        smoker   Crohn's disease Sister    Colitis Brother    Colon cancer Neg Hx    Colon polyps Neg Hx    Esophageal cancer Neg Hx    Rectal cancer Neg Hx    Stomach cancer Neg Hx     Social History Social History   Tobacco Use   Smoking status: Former    Types: Cigarettes   Smokeless tobacco: Never  Vaping Use   Vaping status: Never Used  Substance Use Topics   Alcohol use: No    Alcohol/week: 0.0 standard drinks of alcohol   Drug use: Never     Allergies   Plavix  [clopidogrel  bisulfate] and Sulfamethoxazole-trimethoprim   Review of Systems Review of Systems   Physical Exam Triage Vital Signs ED Triage Vitals  Encounter Vitals Group     BP 10/21/23 1505 (!) 155/77     Girls Systolic BP Percentile --      Girls Diastolic BP Percentile --      Boys Systolic  BP Percentile --      Boys Diastolic BP Percentile --      Pulse Rate 10/21/23 1505 70     Resp 10/21/23 1505 20     Temp 10/21/23 1505 97.6 F (36.4 C)     Temp Source 10/21/23 1505 Oral     SpO2 10/21/23 1505 97 %     Weight --      Height --      Head Circumference --      Peak Flow --      Pain  Score 10/21/23 1508 8     Pain Loc --      Pain Education --      Exclude from Growth Chart --    No data found.  Updated Vital Signs BP (!) 155/77 (BP Location: Left Arm)   Pulse 70   Temp 97.6 F (36.4 C) (Oral)   Resp 20   SpO2 97%   Visual Acuity Right Eye Distance:   Left Eye Distance:   Bilateral Distance:    Right Eye Near:   Left Eye Near:    Bilateral Near:     Physical Exam Constitutional:      Appearance: Normal appearance.  Eyes:     Extraocular Movements: Extraocular movements intact.  Pulmonary:     Effort: Pulmonary effort is normal.  Skin:    Comments: Scattered puncture wounds to the bilateral upper extremities and the left chest wall with surrounding erythema and mild swelling, tender to palpation  Neurological:     Mental Status: He is alert and oriented to person, place, and time. Mental status is at baseline.      UC Treatments / Results  Labs (all labs ordered are listed, but only abnormal results are displayed) Labs Reviewed - No data to display  EKG   Radiology No results found.  Procedures Procedures (including critical care time)  Medications Ordered in UC Medications  ketorolac  (TORADOL ) 30 MG/ML injection 30 mg (30 mg Intramuscular Given 10/21/23 1533)    Initial Impression / Assessment and Plan / UC Course  I have reviewed the triage vital signs and the nursing notes.  Pertinent labs & imaging results that were available during my care of the patient were reviewed by me and considered in my medical decision making (see chart for details).  Puncture wound of left side of chest, puncture wound of multiple sites of the left  and right upper extremities, local reaction to bee sting  Appears to be localized reaction, no signs of infection, no respiratory involvement, stable for outpatient management, Toradol  IM given and prescribed prednisone  and topical lidocaine  for home use recommended supportive care with follow-up if symptoms persist given ER precautions for respiratory involvement Final Clinical Impressions(s) / UC Diagnoses   Final diagnoses:  Local reaction to bee sting, accidental or unintentional, initial encounter  Puncture wound of left side of chest  Puncture wound of multiple sites of left upper extremity, initial encounter     Discharge Instructions      Your evaluated for your bee stings, having localized reactions which are why you are experiencing pain and swelling  You have been given an injection of Toradol  to help reduce pain and swelling and ideally will see improvement within an hour  Start tomorrow take prednisone  every morning with food as directed to continue this process, may take Tylenol  additionally as needed  May apply lidocaine  cream to help soothe the affected areas as needed  May apply ice over the affected areas as needed  If  if your symptoms continue to persist despite use of medicine please follow-up with your primary doctor  At any point if you begin to have difficulty breathing please go to the nearest emergency department   ED Prescriptions     Medication Sig Dispense Auth. Provider   predniSONE  (STERAPRED UNI-PAK 21 TAB) 10 MG (21) TBPK tablet Take by mouth daily. Take 6 tabs by mouth daily  for 1 days, then 5 tabs for 1 days, then 4 tabs for 1 days, then 3  tabs for 1 days, 2 tabs for 1 days, then 1 tab by mouth daily for 1 days 21 tablet Erion Weightman R, NP   lidocaine  (XYLOCAINE ) 5 % ointment Apply 1 Application topically as needed. 35.44 g Teresa Shelba SAUNDERS, NP      PDMP not reviewed this encounter.   Teresa Shelba SAUNDERS, NP 10/21/23 1544

## 2023-11-04 ENCOUNTER — Other Ambulatory Visit: Payer: Self-pay | Admitting: Pharmacist

## 2023-11-04 MED ORDER — BREZTRI AEROSPHERE 160-9-4.8 MCG/ACT IN AERO: 2.0000 | INHALATION_SPRAY | Freq: Two times a day (BID) | RESPIRATORY_TRACT | 11 refills | Status: AC

## 2023-11-04 NOTE — Progress Notes (Signed)
 Brief Telephone Documentation Reason for Call: Patient left message regarding letter received from [employer]  Summary of Call: Patient notes he received an email stating that an updated prescription is needed.  eRx request pended to PCP for Breztri  refill to [facility] ([facility])  Follow Up: Patient given direct line for further questions/concerns.  [name], PharmD Clinical Pharmacist [facility] [phone]

## 2023-11-05 ENCOUNTER — Other Ambulatory Visit (INDEPENDENT_AMBULATORY_CARE_PROVIDER_SITE_OTHER)

## 2023-11-05 ENCOUNTER — Telehealth: Payer: Self-pay | Admitting: Family Medicine

## 2023-11-05 DIAGNOSIS — E538 Deficiency of other specified B group vitamins: Secondary | ICD-10-CM

## 2023-11-05 DIAGNOSIS — E559 Vitamin D deficiency, unspecified: Secondary | ICD-10-CM

## 2023-11-05 NOTE — Telephone Encounter (Signed)
 Patient come in today to have b12 labs rechecked and asked why he was not having his vit d recheck and asked if it could be. Advised we would send a message back to ask.

## 2023-11-06 ENCOUNTER — Ambulatory Visit

## 2023-11-06 DIAGNOSIS — E559 Vitamin D deficiency, unspecified: Secondary | ICD-10-CM

## 2023-11-06 LAB — VITAMIN D 25 HYDROXY (VIT D DEFICIENCY, FRACTURES): VITD: 43.53 ng/mL (ref 30.00–100.00)

## 2023-11-06 LAB — VITAMIN B12: Vitamin B-12: 585 pg/mL (ref 211–911)

## 2023-11-06 NOTE — Addendum Note (Signed)
 Addended by: ISADORA RAISIN on: 11/06/2023 09:40 AM   Modules accepted: Orders

## 2023-11-13 DIAGNOSIS — M25561 Pain in right knee: Secondary | ICD-10-CM | POA: Diagnosis not present

## 2023-11-13 DIAGNOSIS — M5416 Radiculopathy, lumbar region: Secondary | ICD-10-CM | POA: Diagnosis not present

## 2023-11-22 DIAGNOSIS — M5416 Radiculopathy, lumbar region: Secondary | ICD-10-CM | POA: Diagnosis not present

## 2023-11-22 DIAGNOSIS — M222X1 Patellofemoral disorders, right knee: Secondary | ICD-10-CM | POA: Diagnosis not present

## 2023-11-27 DIAGNOSIS — M5416 Radiculopathy, lumbar region: Secondary | ICD-10-CM | POA: Diagnosis not present

## 2023-11-27 DIAGNOSIS — M222X1 Patellofemoral disorders, right knee: Secondary | ICD-10-CM | POA: Diagnosis not present

## 2023-11-29 ENCOUNTER — Ambulatory Visit (INDEPENDENT_AMBULATORY_CARE_PROVIDER_SITE_OTHER): Payer: HMO | Admitting: Otolaryngology

## 2023-11-29 ENCOUNTER — Encounter (INDEPENDENT_AMBULATORY_CARE_PROVIDER_SITE_OTHER): Payer: Self-pay | Admitting: Otolaryngology

## 2023-11-29 VITALS — BP 137/71 | HR 60

## 2023-11-29 DIAGNOSIS — H608X9 Other otitis externa, unspecified ear: Secondary | ICD-10-CM

## 2023-11-29 DIAGNOSIS — H9313 Tinnitus, bilateral: Secondary | ICD-10-CM

## 2023-11-29 DIAGNOSIS — H608X3 Other otitis externa, bilateral: Secondary | ICD-10-CM

## 2023-11-29 DIAGNOSIS — H903 Sensorineural hearing loss, bilateral: Secondary | ICD-10-CM

## 2023-11-29 DIAGNOSIS — H6123 Impacted cerumen, bilateral: Secondary | ICD-10-CM | POA: Diagnosis not present

## 2023-11-29 MED ORDER — MOMETASONE FUROATE 0.1 % EX CREA: TOPICAL_CREAM | CUTANEOUS | 3 refills | Status: AC

## 2023-12-01 DIAGNOSIS — H608X3 Other otitis externa, bilateral: Secondary | ICD-10-CM | POA: Insufficient documentation

## 2023-12-01 DIAGNOSIS — H903 Sensorineural hearing loss, bilateral: Secondary | ICD-10-CM | POA: Insufficient documentation

## 2023-12-01 DIAGNOSIS — H9313 Tinnitus, bilateral: Secondary | ICD-10-CM | POA: Insufficient documentation

## 2023-12-01 NOTE — Progress Notes (Signed)
 Patient ID: Cody Willis, male   DOB: August 04, 1940, 83 y.o.   MRN: [id number]  Follow-up: Bilateral hearing loss, bilateral tinnitus New complaint: Itchy ears  HPI: The patient is an 83 year old male who returns today for his follow-up evaluation. The patient has a history of bilateral sensorineural hearing loss and bilateral tinnitus.  He also has a history of recurrent cerumen impaction.  At his previous visit, the patient was noted to have bilateral high frequency sensorineural hearing loss, worse on the right side at high frequencies. The options of conservative observation versus MRI scan were discussed. The patient elected to proceed with conservative observation at that time.  The patient returns today reporting no significant change in his hearing or tinnitus.  He has a new complaint of itchy sensation in his ears.  He denies any otalgia, otorrhea or vertigo.  No other ENT, GI, or respiratory issue noted since the last visit.   Exam: General: Communicates without difficulty, well nourished, no acute distress. Head: Normocephalic, no evidence injury, no tenderness, facial buttresses intact without stepoff. Face/sinus: No tenderness to palpation and percussion. Facial movement is normal and symmetric. Eyes: PERRL, EOMI. No scleral icterus, conjunctivae clear. Neuro: CN II exam reveals vision grossly intact.  No nystagmus at any point of gaze. Auricles: Intact without lesions. EAC: Bilateral cerumen impaction. Nose: External evaluation reveals normal support and skin without lesions.  Dorsum is intact.  Anterior rhinoscopy reveals pink mucosa over anterior aspect of inferior turbinates and intact septum.  No purulence noted. Oral:  Oral cavity and oropharynx are intact, symmetric, without erythema or edema.  Mucosa is moist without lesions. Neck: Full range of motion without pain.  There is no significant lymphadenopathy.  No masses palpable.  Thyroid  bed within normal limits to palpation.  Parotid  glands and submandibular glands equal bilaterally without mass.  Trachea is midline. Neuro:  CN 2-12 grossly intact.   Procedure: Bilateral cerumen disimpaction Anesthesia: None Description: Under the operating microscope, the cerumen is carefully removed with a combination of cerumen currette, alligator forceps, and suction catheters.  After the cerumen is removed, the TMs are noted to be normal.  Eczematous changes are noted within the ear canals.  No mass, erythema, or lesions. The patient tolerated the procedure well.   Assessment: 1.  Bilateral cerumen impaction.  After the disimpaction procedure, both tympanic membranes and middle ear spaces are normal.   2.  Chronic eczematous otitis externa.   3.  Subjectively stable bilateral high-frequency sensorineural hearing loss and tinnitus.  Plan: 1.  Otomicroscopy with bilateral cerumen disimpaction.  2.  The physical exam findings are reviewed with the patient.  3.  The strategies to cope with tinnitus, including the use of masker, hearing aids, tinnitus retraining therapy, and avoidance of caffeine and alcohol are discussed. 4.  Elocon  cream to treat the chronic eczematous otitis externa. 5.  The patient will return for re-evaluation in 6 months.

## 2023-12-03 DIAGNOSIS — M5416 Radiculopathy, lumbar region: Secondary | ICD-10-CM | POA: Diagnosis not present

## 2023-12-03 DIAGNOSIS — M222X1 Patellofemoral disorders, right knee: Secondary | ICD-10-CM | POA: Diagnosis not present

## 2023-12-10 DIAGNOSIS — M222X1 Patellofemoral disorders, right knee: Secondary | ICD-10-CM | POA: Diagnosis not present

## 2023-12-10 DIAGNOSIS — M5416 Radiculopathy, lumbar region: Secondary | ICD-10-CM | POA: Diagnosis not present

## 2023-12-11 ENCOUNTER — Telehealth: Payer: Self-pay | Admitting: Nurse Practitioner

## 2023-12-11 NOTE — Telephone Encounter (Signed)
 Inbound call from patient stating that he wanted to make an appointment with Dr. Albertus. I advised him that he is currently booked and we could schedule with a PA. He states that he has been seeing the PA and wants to see Dr. Albertus because he hasn't seen him since 2022. He requested that I send a message to the nurse to see if he can be worked in. Please advise.

## 2023-12-11 NOTE — Telephone Encounter (Signed)
 Spoke to patient. He requests to see Dr Albertus only since he hasn't seen him personally in a couple of years. Patient states that he has been having some abdominal discomfort worse before eating and relieved somewhat after eating. Does have some nausea. Ms Marmolejos is advised that unfortunately, I have no availability with Dr Albertus until 02/25/24 but can certainly get him in to see on of Dr Pyrtle's extenders to evaluate his acute problem for now. Patient declines and wishes to go ahead and schedule with Dr Albertus for 02/25/24. Appointment scheduled.

## 2024-01-21 ENCOUNTER — Other Ambulatory Visit: Payer: Self-pay | Admitting: Family Medicine

## 2024-01-21 DIAGNOSIS — I10 Essential (primary) hypertension: Secondary | ICD-10-CM

## 2024-01-21 MED ORDER — VALSARTAN 80 MG PO TABS
80.0000 mg | ORAL_TABLET | Freq: Every day | ORAL | 1 refills | Status: AC
Start: 2024-01-21 — End: ?

## 2024-01-21 NOTE — Telephone Encounter (Signed)
 Copied from CRM #[id number]. Topic: Clinical - Medication Refill >> Jan 21, 2024  4:10 PM Dedra B wrote: Medication: valsartan  (DIOVAN ) 80 MG tablet  Has the patient contacted their pharmacy? Yes, told to call office  This is the patient's preferred pharmacy:  [facility] [id number] [place], [place] - [address] [place] [address] Phone: [phone] Fax: [phone]  Is this the correct pharmacy for this prescription? Yes  Has the prescription been filled recently? No  Is the patient out of the medication? No  Has the patient been seen for an appointment in the last year OR does the patient have an upcoming appointment? Yes  Can we respond through MyChart? Yes  Agent: Please be advised that Rx refills may take up to 3 business days. We ask that you follow-up with your pharmacy.

## 2024-02-11 ENCOUNTER — Other Ambulatory Visit: Payer: Self-pay | Admitting: Nurse Practitioner

## 2024-02-11 ENCOUNTER — Telehealth: Payer: Self-pay | Admitting: Pharmacist

## 2024-02-11 NOTE — Progress Notes (Signed)
 Brief Telephone Documentation Reason for Call: Patient called regarding question on PAP re-enrollment  Summary of Call: Received letter in the mail regarding AZ&Me re-enrollment.   Prefers to sign app in clinic.   Also notes he is taking [name] , received samples through GI though asks if there is a similar program for cost assistance.   Next Steps:  AZ&Me application filled out and uploaded to front office eFax folder for patient signature in front office  Once patient signs, front office to place full application in PCP folder ([place]) PCP to sign, then CMA may fax to [facility] PAP team at [facility] [phone]   __________________________________ [name]   [employer] - only available for pancreatic cancer = Closed  Patient Assistance = [employer]  300% FPL or below  Proof of income required (patient plans to bring SSI letter for self and wife)  Next Steps:  [employer] application filled out and uploaded to front office eFax folder for patient signature in front office  Once patient signs, front office to place full application and social security/income letters in PharmD folder (this will not go to Dr. Watt - PharmD will fax to [facility] Dr).

## 2024-02-12 ENCOUNTER — Encounter: Payer: Self-pay | Admitting: Pharmacist

## 2024-02-12 NOTE — Progress Notes (Signed)
 It is in my box in my office.

## 2024-02-12 NOTE — Progress Notes (Signed)
 Chart Review Reason: Medication Access  Summary: Patient presented to clinic today to sign PAP applications for 2026.  Provided SSI letter for self and spouse. Copies made.   Combined household income = 231% FPL.  Patient should qualify for both programs   Next Steps AZ&Me: [place] application complete. Placed in PCP desk tray for signature.  Then, CMA may fax to [facility] PAP team [phone]  Next Steps [employer] ([name] ): Application faxed to GI prescriber for completion of provider portion (including SSI income letters, insurance card) Cover page included outlining next steps, GI office to complete and fax to [facility] and scan to chart   Gastroenterology  Cara Elida HERO, NP [address] [place], [place] [address] Phone: [phone]   Fax: [phone] DEA #: [id number]   NPI: [id number] fax

## 2024-02-12 NOTE — Progress Notes (Signed)
 done

## 2024-02-12 NOTE — Progress Notes (Signed)
 Will keep an eye out for fax.

## 2024-02-13 ENCOUNTER — Other Ambulatory Visit (HOSPITAL_COMMUNITY): Payer: Self-pay

## 2024-02-17 ENCOUNTER — Telehealth: Payer: Self-pay

## 2024-02-17 NOTE — Telephone Encounter (Signed)
 Received pt and provider portion AZ&ME Breztri  faxed to AZ&ME today.

## 2024-02-18 NOTE — Telephone Encounter (Signed)
 Received approval letter AZ&ME [employer]  thur 04/08/2025 approval letter index

## 2024-02-25 ENCOUNTER — Encounter: Payer: Self-pay | Admitting: Internal Medicine

## 2024-02-25 ENCOUNTER — Ambulatory Visit: Admitting: Internal Medicine

## 2024-02-25 VITALS — BP 130/70 | HR 70 | Ht 66.0 in | Wt 167.0 lb

## 2024-02-25 DIAGNOSIS — K219 Gastro-esophageal reflux disease without esophagitis: Secondary | ICD-10-CM | POA: Diagnosis not present

## 2024-02-25 DIAGNOSIS — K8689 Other specified diseases of pancreas: Secondary | ICD-10-CM

## 2024-02-25 DIAGNOSIS — K861 Other chronic pancreatitis: Secondary | ICD-10-CM | POA: Diagnosis not present

## 2024-02-25 NOTE — Patient Instructions (Addendum)
 Continue [name] , B12 and pantoprazole  at current dose.  Please reach out to our office if your abdominal pain returns.   _______________________________________________________  If your blood pressure at your visit was 140/90 or greater, please contact your primary care physician to follow up on this.  _______________________________________________________  If you are age 82 or older, your body mass index should be between 23-30. Your Body mass index is 26.95 kg/m. If this is out of the aforementioned range listed, please consider follow up with your Primary Care Provider.  If you are age 1 or younger, your body mass index should be between 19-25. Your Body mass index is 26.95 kg/m. If this is out of the aformentioned range listed, please consider follow up with your Primary Care Provider.   ________________________________________________________  The [facility] providers would like to encourage you to use MYCHART to communicate with providers for non-urgent requests or questions.  Due to long hold times on the telephone, sending your provider a message by [facility] may be a faster and more efficient way to get a response.  Please allow 48 business hours for a response.  Please remember that this is for non-urgent requests.  _______________________________________________________  [facility] is using a team-based approach to care.  Your team is made up of your doctor and two to three APPS. Our APPS (Nurse Practitioners and Physician Assistants) work with your physician to ensure care continuity for you. They are fully qualified to address your health concerns and develop a treatment plan. They communicate directly with your gastroenterologist to care for you. Seeing the Advanced Practice Practitioners on your physician's team can help you by facilitating care more promptly, often allowing for earlier appointments, access to diagnostic testing, procedures, and other specialty  referrals.

## 2024-02-25 NOTE — Progress Notes (Signed)
 Subjective:    Patient ID: Cody Willis, male    DOB: 1940/10/08, 83 y.o.   MRN: [id number]  HPI Cody Willis is an 83 year old male with idiopathic chronic pancreatitis and GERD who presents for follow-up.  He continues to take pancreatic enzymes, specifically [name] , at a dose of two capsules three times a day (20K capsule), although he has been spreading them out due to cost concerns. His insurance will change next year, affecting the cost of [name] , and he has been exploring assistance programs to help with the expense. He has been in contact with a nurse at [facility] to potentially obtain a reduced price or free medication.  Approximately three weeks ago, he experienced an episode of stomach pain followed by vomiting, which was the first such occurrence in ten years. No ongoing abdominal pain or frequent vomiting since that episode. His bowel movements can sometimes be loose, which he manages with half a dose of antiparalim, and he acknowledges that insufficient [name]  can lead to loose stools.  He continues to take pantoprazole  for GERD and reports that his swallowing has been okay since his esophagus was previously stretched. He reports that he is still taking B12 injections. He mentions taking a tablespoon of vinegar every morning, which he believes helps regulate his weight.  He notes feeling hungry but also feeling full quickly, although he is able to finish his meals. No significant weight loss.   Review of Systems As per HPI, otherwise negative  Current Medications, Allergies, Past Medical History, Past Surgical History, Family History and Social History were reviewed in [employer] record.    Objective:   Physical Exam BP 130/70   Pulse 70   Ht 5' 6 (1.676 m)   Wt 167 lb (75.8 kg)   BMI 26.95 kg/m  Gen: awake, alert, NAD HEENT: anicteric  CV: RRR, no mrg Pulm: CTA b/l Abd: soft, NT/ND, +BS throughout Ext: no c/c/e Neuro:  nonfocal  Colonoscopy 07/31/2019: Diverticulosis in the sigmoid colon and in the cecum. - Small internal hemorrhoids. - No specimens collected.   EGD 11/26/2016: - 2 cm hiatal hernia. - Stable, gastric polypoid mucosa in the antrum. Multiple biopsies. - A single, stable, duodenal polyp. Multiple biopsies. 1. Surgical [P], duodenal bulb polyp - GASTRIC-TYPE MUCOSA WITH MILD INFLAMMATION AND REACTIVE CHANGES. - NO DYSPLASIA OR MALIGNANCY. 2. Surgical [P], antral polyp - HYPERPLASTIC GASTRIC POLYP WITH INFLAMMATION AND REACTIVE CHANGES. - WARTHIN-STARRY STAIN NEGATIVE FOR HELICOBACTER PYLORI. - NO ADENOMATOUS CHANGE, INTESTINAL METAPLASIA, DYSPLASIA OR MALIGNANCY.   EGD 10/06/2015: - LA Grade A esophagitis. - No endoscopic esophageal abnormality to explain patient's dysphagia. Esophagus dilated to 17 mm with Savary over a guidewire. - A single gastric polypoid lesion. Biopsied. - A single duodenal polyp. Biopsied.  Pathology equals gastric heterotopia  EGD 12/07/2021: - No endoscopic esophageal abnormality to explain patient's dysphagia. Esophagus dilated with 52 Fr. Maloney.  - Very small, 1 cm, hiatal hernia.  - Granular gastric mucosa. Biopsied.  - Normal examined duodenum. - ANTRAL AND OXYNTIC MUCOSA WITH NO SIGNIFICANT PATHOLOGY. - NO HELICOBACTER PYLORI ORGANISMS IDENTIFIED ON H&E STAINED SLIDE.      Assessment & Plan:   Chronic idiopathic pancreatitis with EPI Idiopathic chronic pancreatitis without recent acute pancreatitis. Discussed potential for acute pancreatitis if pain recurs.  His pain was short-lived and so this would not be consistent with acute pancreatitis. - Continue [name]  (or Creon ) for pancreatic enzyme replacement.  40,000 -80,000 units with meals, 40,000  with snacks - Provided samples of [name]  (or Creon ) if available. - Coordinate with Cody Willis, clinical pharmacist at his primary care location to help with pancreatic enzyme drug coverage    Gastroesophageal reflux disease (GERD) GERD managed with pantoprazole . No issues with swallowing or esophageal stricture. - Continue pantoprazole  40 mg daily for GERD management.  Vitamin B12 deficiency Managed with B12 injections. - Continue B12 injections 1000 mg monthly  Colon cancer screening  No polyps in 2021, screening deferred based on age.  30 minutes total spent today including patient facing time, coordination of care, reviewing medical history/procedures/pertinent radiology studies, and documentation of the encounter.

## 2024-02-26 ENCOUNTER — Encounter: Payer: Self-pay | Admitting: Pharmacist

## 2024-02-26 ENCOUNTER — Telehealth: Payer: Self-pay

## 2024-02-26 NOTE — Telephone Encounter (Signed)
 Dr. Albertus signed paperwork and I faxed the application to [facility] patient assistance with fax number provided on paperwork.

## 2024-02-26 NOTE — Progress Notes (Signed)
 Chart Review Reason: Patient assistance  Summary: [name]  patient assistance application through [place] program re-faxed Confirmed received by GI office.   12/2[name] fax received requesting proof of copay/coverage.   Test claim requested from [facility]  [name], PharmD Clinical Pharmacist [facility] [phone]

## 2024-03-02 ENCOUNTER — Other Ambulatory Visit: Payer: Self-pay | Admitting: Nurse Practitioner

## 2024-03-05 ENCOUNTER — Other Ambulatory Visit: Payer: Self-pay | Admitting: Nurse Practitioner

## 2024-03-08 NOTE — Progress Notes (Unsigned)
 [name], MD, CAQ Sports Medicine [facility] [address] [place], [address]  Phone: [phone]  FAX: [phone]  Cody Willis - 83 y.o. male  MRN [id number]  Date of Birth: 1940-10-14  Date: 03/09/2024  PCP: Cody Mirza, MD  Referral: Cody Mirza, MD  No chief complaint on file.  Subjective:   Cody Willis is a 83 y.o. very pleasant male patient with There is no height or weight on file to calculate BMI. who presents with the following:  Discussed the use of AI scribe software for clinical note transcription with the patient, who gave verbal consent to proceed.  This is a fairly new patient to me, who I have met only 1 time previously.  History is significant for prior stroke, COPD, coronary disease. Additional history significant for hypertension, hyperlipidemia, history of melanoma.  Coronary disease, sees cardiology regularly.  History of stroke, he does see Dr. Margaret from [facility] neurology.  History of COPD, last time I spoke to him he stopped his Spiriva .  He does see Dr. Geronimo from pulmonology.  He also has B12 deficiency, the last time I saw him we were going to do a trial of oral B12.  He also has chronic pancreatic insufficiency and sees Dr. Albertus regularly. History of Present Illness     Review of Systems is noted in the HPI, as appropriate  Objective:   There were no vitals taken for this visit.  GEN: No acute distress; alert,appropriate. PULM: Breathing comfortably in no respiratory distress PSYCH: Normally interactive.   Laboratory and Imaging Data:  Assessment and Plan:   No diagnosis found. Assessment & Plan   Medication Management during today's office visit: No orders of the defined types were placed in this encounter.  There are no discontinued medications.  Orders placed today for conditions managed today: No orders of the defined types were  placed in this encounter.   Disposition: No follow-ups on file.  [employer] One speech-to-text software was used for transcription in this dictation.  Possible transcriptional errors can occur using Animal nutritionist.   Signed,  Willis DASEN. Takera Rayl, MD   Outpatient Encounter Medications as of 03/09/2024  Medication Sig   albuterol  (VENTOLIN  HFA) 108 (90 Base) MCG/ACT inhaler Inhale 1-2 puffs into the lungs every 4 (four) hours as needed for wheezing or shortness of breath.   aspirin  EC 81 MG tablet Take 81 mg by mouth daily. Swallow whole.   budesonide-glycopyrrolate-formoterol (BREZTRI  AEROSPHERE) 160-9-4.8 MCG/ACT AERO inhaler Inhale 2 puffs into the lungs 2 (two) times daily.   cholecalciferol (VITAMIN D3) 25 MCG (1000 UNIT) tablet Take 1,000 Units by mouth daily.   cyanocobalamin  (VITAMIN B12) 1000 MCG/ML injection INJECT 1 ML INTRAMUSCULARLY EVERY 30 DAYS   ketoconazole  (NIZORAL ) 2 % shampoo use 2 - 3 times weekly, apply topically to scalp , neck and chest, leave on skin for at least 5 minutes before rinsing off.   lidocaine  (XYLOCAINE ) 5 % ointment Apply 1 Application topically as needed.   mometasone  (ELOCON ) 0.1 % cream Apply topically daily as needed for itch   Pancrelipase , Lip-Prot-Amyl, ([name] ) 25000-79000 units CPEP TAKE 2 CAPULES BY MOUTH WITH BREAKFAST, WITH LUNCH AND WITH AN EVENING MEAL   pantoprazole  (PROTONIX ) 40 MG tablet TAKE 1 TABLET BY MOUTH EVERY MORNING   rosuvastatin  (CRESTOR ) 20 MG tablet Take 1 tablet (20 mg total) by mouth daily.   triamcinolone  (KENALOG ) 0.025 % ointment Apply 1 Application topically daily  as needed.   valsartan  (DIOVAN ) 80 MG tablet Take 1 tablet (80 mg total) by mouth daily.   No facility-administered encounter medications on file as of 03/09/2024.

## 2024-03-09 ENCOUNTER — Encounter: Payer: Self-pay | Admitting: Family Medicine

## 2024-03-09 ENCOUNTER — Ambulatory Visit: Admitting: Family Medicine

## 2024-03-09 VITALS — BP 130/60 | HR 61 | Temp 98.0°F | Ht 66.0 in | Wt 170.1 lb

## 2024-03-09 DIAGNOSIS — Z8673 Personal history of transient ischemic attack (TIA), and cerebral infarction without residual deficits: Secondary | ICD-10-CM | POA: Diagnosis not present

## 2024-03-09 DIAGNOSIS — J431 Panlobular emphysema: Secondary | ICD-10-CM

## 2024-03-09 DIAGNOSIS — I10 Essential (primary) hypertension: Secondary | ICD-10-CM

## 2024-03-09 DIAGNOSIS — E538 Deficiency of other specified B group vitamins: Secondary | ICD-10-CM

## 2024-03-09 DIAGNOSIS — I2510 Atherosclerotic heart disease of native coronary artery without angina pectoris: Secondary | ICD-10-CM | POA: Diagnosis not present

## 2024-03-09 DIAGNOSIS — E782 Mixed hyperlipidemia: Secondary | ICD-10-CM

## 2024-03-09 DIAGNOSIS — Z96651 Presence of right artificial knee joint: Secondary | ICD-10-CM

## 2024-03-09 DIAGNOSIS — Z87891 Personal history of nicotine dependence: Secondary | ICD-10-CM

## 2024-03-09 MED ORDER — ALBUTEROL SULFATE HFA 108 (90 BASE) MCG/ACT IN AERS: 1.0000 | INHALATION_SPRAY | RESPIRATORY_TRACT | 3 refills | Status: AC | PRN

## 2024-03-10 ENCOUNTER — Telehealth: Payer: Self-pay

## 2024-03-10 ENCOUNTER — Ambulatory Visit: Admitting: Dermatology

## 2024-03-10 ENCOUNTER — Encounter: Payer: Self-pay | Admitting: Dermatology

## 2024-03-10 ENCOUNTER — Other Ambulatory Visit (HOSPITAL_COMMUNITY): Payer: Self-pay

## 2024-03-10 DIAGNOSIS — B353 Tinea pedis: Secondary | ICD-10-CM

## 2024-03-10 DIAGNOSIS — L820 Inflamed seborrheic keratosis: Secondary | ICD-10-CM | POA: Diagnosis not present

## 2024-03-10 DIAGNOSIS — Z7189 Other specified counseling: Secondary | ICD-10-CM

## 2024-03-10 DIAGNOSIS — L219 Seborrheic dermatitis, unspecified: Secondary | ICD-10-CM

## 2024-03-10 DIAGNOSIS — C44319 Basal cell carcinoma of skin of other parts of face: Secondary | ICD-10-CM | POA: Diagnosis not present

## 2024-03-10 DIAGNOSIS — D692 Other nonthrombocytopenic purpura: Secondary | ICD-10-CM

## 2024-03-10 DIAGNOSIS — B351 Tinea unguium: Secondary | ICD-10-CM | POA: Diagnosis not present

## 2024-03-10 DIAGNOSIS — D492 Neoplasm of unspecified behavior of bone, soft tissue, and skin: Secondary | ICD-10-CM | POA: Diagnosis not present

## 2024-03-10 DIAGNOSIS — L570 Actinic keratosis: Secondary | ICD-10-CM | POA: Diagnosis not present

## 2024-03-10 DIAGNOSIS — L578 Other skin changes due to chronic exposure to nonionizing radiation: Secondary | ICD-10-CM

## 2024-03-10 DIAGNOSIS — W908XXA Exposure to other nonionizing radiation, initial encounter: Secondary | ICD-10-CM | POA: Diagnosis not present

## 2024-03-10 DIAGNOSIS — Z79899 Other long term (current) drug therapy: Secondary | ICD-10-CM

## 2024-03-10 MED ORDER — FLUOROURACIL 5 % EX CREA: TOPICAL_CREAM | CUTANEOUS | 2 refills | Status: AC

## 2024-03-10 MED ORDER — JUBLIA 10 % EX SOLN: CUTANEOUS | 5 refills | Status: DC

## 2024-03-10 MED ORDER — CICLOPIROX OLAMINE 0.77 % EX CREA: TOPICAL_CREAM | CUTANEOUS | 2 refills | Status: AC

## 2024-03-10 NOTE — Telephone Encounter (Signed)
 Pharmacy Patient Advocate Encounter  Insurance verification completed.   The patient is insured through [facility] ADVANTAGE/RX ADVANCE   Ran test claim for [name] . Currently a quantity of 180 is a 30 day supply and the co-pay is $0 .   This test claim was processed through [facility]- copay amounts may vary at other pharmacies due to pharmacy/plan contracts, or as the patient moves through the different stages of their insurance plan.

## 2024-03-10 NOTE — Patient Instructions (Addendum)
 Start Fluorouracil  5% cream twice daily for 7 days to temples and lateral cheeks.    Start Jublia to affected toenail every night.   Apply ciclopirox cream twice a day until rash on feet is clear and for one week after.  Recommend starting moisturizer with exfoliant (Urea, Salicylic acid, or Lactic acid) one to two times daily to help smooth rough and bumpy skin.  OTC options include Cetaphil Rough and Bumpy lotion (Urea), Eucerin Roughness Relief lotion or spot treatment cream (Urea), CeraVe SA lotion/cream for Rough and Bumpy skin (Sal Acid), Gold Bond Rough and Bumpy cream (Sal Acid), and AmLactin 12% lotion/cream (Lactic Acid).  If applying in morning, also apply sunscreen to sun-exposed areas, since these exfoliating moisturizers can increase sensitivity to sun.    5-Fluorouracil  Patient Education   Actinic keratoses are the dry, red scaly spots on the skin caused by sun damage. A portion of these spots can turn into skin cancer with time, and treating them can help prevent development of skin cancer.   Treatment of these spots requires removal of the defective skin cells. There are various ways to remove actinic keratoses, including freezing with liquid nitrogen, treatment with creams, or treatment with a blue light procedure in the office.   5-fluorouracil  cream is a topical cream used to treat actinic keratoses. It works by interfering with the growth of abnormal fast-growing skin cells, such as actinic keratoses. These cells peel off and are replaced by healthy ones. THIS CREAM SHOULD BE KEPT OUT OF REACH OF CHILDREN AND PETS AND SHOULD NOT BE USED BY PREGNANT WOMEN.  INSTRUCTIONS FOR 5-FLUOROURACIL  CREAM:   5-fluorouracil  cream typically needs to be used for 7-14 days depending on the location of treatment. A thin layer should be applied twice a day to the treatment areas recommended by your physician.    Avoid contact with your eyes or nostrils. Avoid applying the cream to your  eyelids or lips unless directed to apply there by your physician. Do not use 5-fluorouracil  on infected or open wounds.   You will develop redness, irritation and some crusting at areas where you have pre-cancer damage/actinic keratoses. IF YOU DEVELOP PAIN, BLEEDING, OR SIGNIFICANT CRUSTING, STOP THE TREATMENT EARLY - you have already gotten a good response and the actinic keratoses should clear up well.  Wash your hands after applying 5-fluorouracil  5% cream on your skin.   A moisturizer or sunscreen with a minimum SPF 30 should be applied each morning.   Once you have finished the treatment, you can apply a thin layer of Vaseline twice a day to irritated areas to soothe and calm the areas more quickly. If you experience significant discomfort, contact your physician.  For some patients it is necessary to repeat the treatment for best results.  SIDE EFFECTS: When using 5-fluorouracil  cream, you may have mild irritation, such as redness, dryness, swelling, or a mild burning sensation. This usually resolves within 2 weeks. The more actinic keratoses you have, the more redness and inflammation you can expect during treatment. Eye irritation has been reported rarely. If this occurs, please let us  know.   If you have any trouble using this cream, please send us  a MyChart Message or call the office. If you have any other questions about this information, please do not hesitate to ask me before you leave the office or reach out on MyChart or by phone.    Recommend daily broad spectrum sunscreen SPF 30+ to sun-exposed areas, reapply every 2 hours as  needed. Call for new or changing lesions.  Staying in the shade or wearing long sleeves, sun glasses (UVA+UVB protection) and wide brim hats (4-inch brim around the entire circumference of the hat) are also recommended for sun protection.    Cryotherapy Aftercare  Wash gently with soap and water everyday.   Apply Vaseline and Band-Aid daily until  healed.    Wound Care Instructions  Cleanse wound gently with soap and water once a day then pat dry with clean gauze. Apply a thin coat of Petrolatum (petroleum jelly, Vaseline) over the wound (unless you have an allergy to this). We recommend that you use a new, sterile tube of Vaseline. Do not pick or remove scabs. Do not remove the yellow or white healing tissue from the base of the wound.  Cover the wound with fresh, clean, nonstick gauze and secure with paper tape. You may use Band-Aids in place of gauze and tape if the wound is small enough, but would recommend trimming much of the tape off as there is often too much. Sometimes Band-Aids can irritate the skin.  You should call the office for your biopsy report after 1 week if you have not already been contacted.  If you experience any problems, such as abnormal amounts of bleeding, swelling, significant bruising, significant pain, or evidence of infection, please call the office immediately.  FOR ADULT SURGERY PATIENTS: If you need something for pain relief you may take 1 extra strength Tylenol  (acetaminophen ) AND 2 Ibuprofen  (200mg  each) together every 4 hours as needed for pain. (do not take these if you are allergic to them or if you have a reason you should not take them.) Typically, you may only need pain medication for 1 to 3 days.        Due to recent changes in healthcare laws, you may see results of your pathology and/or laboratory studies on MyChart before the doctors have had a chance to review them. We understand that in some cases there may be results that are confusing or concerning to you. Please understand that not all results are received at the same time and often the doctors may need to interpret multiple results in order to provide you with the best plan of care or course of treatment. Therefore, we ask that you please give us  2 business days to thoroughly review all your results before contacting the office for  clarification. Should we see a critical lab result, you will be contacted sooner.   If You Need Anything After Your Visit  If you have any questions or concerns for your doctor, please call our main line at [phone] and press option 4 to reach your doctor's medical assistant. If no one answers, please leave a voicemail as directed and we will return your call as soon as possible. Messages left after 4 pm will be answered the following business day.   You may also send us  a message via MyChart. We typically respond to MyChart messages within 1-2 business days.  For prescription refills, please ask your pharmacy to contact our office. Our fax number is [phone].  If you have an urgent issue when the clinic is closed that cannot wait until the next business day, you can page your doctor at the number below.    Please note that while we do our best to be available for urgent issues outside of office hours, we are not available 24/7.   If you have an urgent issue and are unable to reach  us , you may choose to seek medical care at your doctor's office, retail clinic, urgent care center, or emergency room.  If you have a medical emergency, please immediately call 911 or go to the emergency department.  Pager Numbers  - Dr. Hester: [phone]  - Dr. Jackquline: [phone]  - Dr. Claudene: [phone]   - Dr. Raymund: [phone]  In the event of inclement weather, please call our main line at [phone] for an update on the status of any delays or closures.  Dermatology Medication Tips: Please keep the boxes that topical medications come in in order to help keep track of the instructions about where and how to use these. Pharmacies typically print the medication instructions only on the boxes and not directly on the medication tubes.   If your medication is too expensive, please contact our office at [phone] option 4 or send us  a message through MyChart.   We are unable to  tell what your co-pay for medications will be in advance as this is different depending on your insurance coverage. However, we may be able to find a substitute medication at lower cost or fill out paperwork to get insurance to cover a needed medication.   If a prior authorization is required to get your medication covered by your insurance company, please allow us  1-2 business days to complete this process.  Drug prices often vary depending on where the prescription is filled and some pharmacies may offer cheaper prices.  The website www.goodrx.com contains coupons for medications through different pharmacies. The prices here do not account for what the cost may be with help from insurance (it may be cheaper with your insurance), but the website can give you the price if you did not use any insurance.  - You can print the associated coupon and take it with your prescription to the pharmacy.  - You may also stop by our office during regular business hours and pick up a GoodRx coupon card.  - If you need your prescription sent electronically to a different pharmacy, notify our office through [facility] or by phone at [phone] option 4.     Si Usted Necesita Algo Despus de Su Visita  Tambin puede enviarnos un mensaje a travs de Clinical Cytogeneticist. Por lo general respondemos a los mensajes de MyChart en el transcurso de 1 a 2 das hbiles.  Para renovar recetas, por favor pida a su farmacia que se ponga en contacto con nuestra oficina. Randi lakes de fax es [place] [phone].  Si tiene un asunto urgente cuando la clnica est cerrada y que no puede esperar hasta el siguiente da hbil, puede llamar/localizar a su doctor(a) al nmero que aparece a continuacin.   Por favor, tenga en cuenta que aunque hacemos todo lo posible para estar disponibles para asuntos urgentes fuera del horario de [place], no estamos disponibles las 24 horas del da, los 7 [address]  [address] de la [place].   Si tiene un problema  urgente y no puede comunicarse con nosotros, puede optar por buscar atencin mdica  en el consultorio de su doctor(a), en una clnica privada, en un centro de atencin urgente o en una sala de emergencias.  Si tiene engineer, drilling, por favor llame inmediatamente al 911 o vaya a la sala de emergencias.  Nmeros de bper  - Dr. Hester: [phone]  - Dra. Jackquline: [phone]  - Dr. Claudene: [phone]  - Dra. Kitts: [phone]  En caso de inclemencias del tiempo, por favor llame a nuestra lnea principal  al [phone] para una actualizacin sobre el [place] de cualquier retraso o cierre.  Consejos para la medicacin en dermatologa: Por favor, guarde las cajas en las que vienen los medicamentos de uso tpico para ayudarle a seguir las instrucciones sobre dnde y cmo usarlos. Las farmacias generalmente imprimen las instrucciones del medicamento slo en las cajas y no directamente en los tubos del [place].   Si su medicamento es muy caro, por favor, pngase en contacto con landry rieger llamando al [phone] y presione la opcin 4 o envenos un mensaje a travs de Clinical Cytogeneticist.   No podemos decirle cul ser su copago por los medicamentos por adelantado ya que esto es diferente dependiendo de la cobertura de su seguro. Sin embargo, es posible que podamos encontrar un medicamento sustituto a audiological scientist un formulario para que el seguro cubra el medicamento que se considera necesario.   Si se requiere una autorizacin previa para que su compaa de seguros cubra su medicamento, por favor permtanos de 1 a 2 das hbiles para completar este proceso.  Los precios de los medicamentos varan con frecuencia dependiendo del environmental consultant de dnde se surte la receta y alguna farmacias pueden ofrecer precios ms baratos.  El sitio web www.goodrx.com tiene cupones para medicamentos de health and safety inspector. Los precios aqu no tienen en cuenta lo que podra costar con la ayuda del  seguro (puede ser ms barato con su seguro), pero el sitio web puede darle el precio si no utiliz tourist information centre manager.  - Puede imprimir el cupn correspondiente y llevarlo con su receta a la farmacia.  - Tambin puede pasar por nuestra oficina durante el horario de atencin regular y education officer, museum una tarjeta de cupones de GoodRx.  - Si necesita que su receta se enve electrnicamente a una farmacia diferente, informe a nuestra oficina a travs de MyChart de [place] o por telfono llamando al [phone] y presione la opcin 4.

## 2024-03-10 NOTE — Progress Notes (Unsigned)
 Follow-Up Visit   Subjective  Cody Willis is a 83 y.o. male who presents for the following: Actinic keratosis.  6 month follow up. Tx with LN2. Used 5FU as directed on forehead. Got good result.  Check R great toe. Has been using triple antibiotic ointment on it. Painful.   The patient has spots, moles and lesions to be evaluated, some may be new or changing and the patient may have concern these could be cancer.     The following portions of the chart were reviewed this encounter and updated as appropriate: medications, allergies, medical history  Review of Systems:  No other skin or systemic complaints except as noted in HPI or Assessment and Plan.  Objective  Well appearing patient in no apparent distress; mood and affect are within normal limits.  A focused examination was performed of the following areas: Face, ears, hands, right foot, arms, hands, left leg  Relevant exam findings are noted in the Assessment and Plan.  TINEA PEDIS Exam: Scaling and maceration web spaces and over distal and lateral sole of R foot.  Treatment Plan: Apply ciclopirox cream twice a day until rash is clear and for one week after.  Recommend starting moisturizer with exfoliant (Urea, Salicylic acid, or Lactic acid) one to two times daily to help smooth rough and bumpy skin.  OTC options include Cetaphil Rough and Bumpy lotion (Urea), Eucerin Roughness Relief lotion or spot treatment cream (Urea), CeraVe SA lotion/cream for Rough and Bumpy skin (Sal Acid), Gold Bond Rough and Bumpy cream (Sal Acid), and AmLactin 12% lotion/cream (Lactic Acid).  If applying in morning, also apply sunscreen to sun-exposed areas, since these exfoliating moisturizers can increase sensitivity to sun.   ONYCHOMYCOSIS Exam: Thickened R great toenail with subungal debris c/w onychomycosis  Chronic and persistent condition with duration or expected duration over one year. Condition is symptomatic/ bothersome to  patient. Not currently at goal.  Treatment Plan: Start Jublia to affected toenail every night.   Purpura - Chronic; persistent and recurrent.  Treatable, but not curable. - Violaceous macules and patches at arms and hands - Benign - Related to trauma, age, sun damage and/or use of blood thinners, chronic use of topical and/or oral steroids - Observe - Can use OTC arnica containing moisturizer such as Dermend Bruise Formula if desired - Call for worsening or other concerns     Left Shoulder x1, L forearm x1, R jaw x1, L popliteal x1 (2) Verrucous white, scaly papule L lower cheek x1, forehead x2, R temple x1, R medial cheek x1 (5) Erythematous thin papules/macules with gritty scale.  Right Temple at Hair line 7 mm pink pearly papule   Assessment & Plan   INFLAMED SEBORRHEIC KERATOSIS (2) Left Shoulder x1, L forearm x1, R jaw x1, L popliteal x1 (2) Vs verruca (L shoulder)  Symptomatic, irritating, patient would like treated. Destruction of lesion - Left Shoulder x1, L forearm x1, R jaw x1, L popliteal x1 (2)  Destruction method: cryotherapy   Informed consent: discussed and consent obtained   Lesion destroyed using liquid nitrogen: Yes   Region frozen until ice ball extended beyond lesion: Yes   Outcome: patient tolerated procedure well with no complications   Post-procedure details: wound care instructions given   Additional details:  Prior to procedure, discussed risks of blister formation, small wound, skin dyspigmentation, or rare scar following cryotherapy. Recommend Vaseline ointment to treated areas while healing.   AK (ACTINIC KERATOSIS) (5) L lower cheek x1, forehead  x2, R temple x1, R medial cheek x1 (5) Actinic keratoses are precancerous spots that appear secondary to cumulative UV radiation exposure/sun exposure over time. They are chronic with expected duration over 1 year. A portion of actinic keratoses will progress to squamous cell carcinoma of the skin. It is  not possible to reliably predict which spots will progress to skin cancer and so treatment is recommended to prevent development of skin cancer.  Recommend daily broad spectrum sunscreen SPF 30+ to sun-exposed areas, reapply every 2 hours as needed.  Recommend staying in the shade or wearing long sleeves, sun glasses (UVA+UVB protection) and wide brim hats (4-inch brim around the entire circumference of the hat). Call for new or changing lesions. Destruction of lesion - L lower cheek x1, forehead x2, R temple x1, R medial cheek x1 (5)  Destruction method: cryotherapy   Informed consent: discussed and consent obtained   Lesion destroyed using liquid nitrogen: Yes   Region frozen until ice ball extended beyond lesion: Yes   Outcome: patient tolerated procedure well with no complications   Post-procedure details: wound care instructions given   Additional details:  Prior to procedure, discussed risks of blister formation, small wound, skin dyspigmentation, or rare scar following cryotherapy. Recommend Vaseline ointment to treated areas while healing.   NEOPLASM OF SKIN Right Temple at Hair line Epidermal / dermal shaving  Lesion diameter (cm):  0.7 Informed consent: discussed and consent obtained   Patient was prepped and draped in usual sterile fashion: Area prepped with alcohol. Anesthesia: the lesion was anesthetized in a standard fashion   Anesthetic:  1% lidocaine  w/ epinephrine 1-100,000 buffered w/ 8.4% NaHCO3 Instrument used: flexible razor blade   Hemostasis achieved with: pressure, aluminum chloride and electrodesiccation   Outcome: patient tolerated procedure well    Destruction of lesion  Destruction method: electrodesiccation and curettage   Informed consent: discussed and consent obtained   Curettage performed in three different directions: Yes   Electrodesiccation performed over the curetted area: Yes   Final wound size (cm):  1.1 Hemostasis achieved with:  pressure,  aluminum chloride and electrodesiccation Outcome: patient tolerated procedure well with no complications   Post-procedure details: wound care instructions given   Additional details:  Mupirocin  ointment and Bandaid applied   Specimen 1 - Surgical pathology Differential Diagnosis: BCC vs other  Check Margins: No  EDC today ACTINIC SKIN DAMAGE   SEBORRHEIC DERMATITIS   Related Medications ketoconazole  (NIZORAL ) 2 % shampoo use 2 - 3 times weekly, apply topically to scalp , neck and chest, leave on skin for at least 5 minutes before rinsing off.   ACTINIC DAMAGE WITH PRECANCEROUS ACTINIC KERATOSES Counseling for Topical Chemotherapy Management: Patient exhibits: - Severe, confluent actinic changes with pre-cancerous actinic keratoses that is secondary to cumulative UV radiation exposure over time - Condition that is severe; chronic, not at goal. - diffuse scaly erythematous macules and papules with underlying dyspigmentation - Discussed Prescription Field Treatment topical Chemotherapy for Severe, Chronic Confluent Actinic Changes with Pre-Cancerous Actinic Keratoses Field treatment involves treatment of an entire area of skin that has confluent Actinic Changes (Sun/ Ultraviolet light damage) and PreCancerous Actinic Keratoses by method of PhotoDynamic Therapy (PDT) and/or prescription Topical Chemotherapy agents such as 5-fluorouracil , 5-fluorouracil /calcipotriene, and/or imiquimod.  The purpose is to decrease the number of clinically evident and subclinical PreCancerous lesions to prevent progression to development of skin cancer by chemically destroying early precancer changes that may or may not be visible.  It has been shown to  reduce the risk of developing skin cancer in the treated area. As a result of treatment, redness, scaling, crusting, and open sores may occur during treatment course. One or more than one of these methods may be used and may have to be used several times to  control, suppress and eliminate the PreCancerous changes. Discussed treatment course, expected reaction, and possible side effects. - Recommend daily broad spectrum sunscreen SPF 30+ to sun-exposed areas, reapply every 2 hours as needed.  - Staying in the shade or wearing long sleeves, sun glasses (UVA+UVB protection) and wide brim hats (4-inch brim around the entire circumference of the hat) are also recommended. - Call for new or changing lesions. Start Fluorouracil  5% cream twice daily for 7 days to temples and lateral cheeks.   Reviewed course of treatment and expected reaction.  Patient advised to expect inflammation and crusting and advised that erosions are possible.  Patient advised to be diligent with sun protection during and after treatment. Handout with details of how to apply medication and what to expect provided. Counseled to keep medication out of reach of children and pets.  SEBORRHEIC DERMATITIS Exam: Pink patches with greasy scale at ears  Chronic and persistent condition with duration or expected duration over one year. Condition is improving with treatment but not currently at goal.    Seborrheic Dermatitis is a chronic persistent rash characterized by pinkness and scaling most commonly of the mid face but also can occur on the scalp (dandruff), ears; mid chest, mid back and groin.  It tends to be exacerbated by stress and cooler weather.  People who have neurologic disease may experience new onset or exacerbation of existing seborrheic dermatitis.  The condition is not curable but treatable and can be controlled.  Treatment Plan:  Continue Mometasone  as directed by ENT as needed only.   Return in about 6 months (around 09/08/2024) for UBSE, HxBCCs, HxMM, HxSCC.  I, Jill Parcell, CMA, am acting as scribe for Rexene Rattler, MD.   Documentation: I have reviewed the above documentation for accuracy and completeness, and I agree with the above.  Rexene Rattler, MD

## 2024-03-10 NOTE — Telephone Encounter (Signed)
 Recevied fax from [place] they need proof of copay.  Letter has been indexed to chart.

## 2024-03-11 LAB — SURGICAL PATHOLOGY

## 2024-03-16 ENCOUNTER — Ambulatory Visit: Payer: Self-pay | Admitting: Dermatology

## 2024-03-16 ENCOUNTER — Ambulatory Visit: Admitting: *Deleted

## 2024-03-16 ENCOUNTER — Encounter: Payer: Self-pay | Admitting: Dermatology

## 2024-03-16 VITALS — Ht 66.0 in | Wt 170.0 lb

## 2024-03-16 DIAGNOSIS — Z0000 Encounter for general adult medical examination without abnormal findings: Secondary | ICD-10-CM

## 2024-03-16 NOTE — Progress Notes (Signed)
 Chief Complaint  Patient presents with   Medicare Wellness     Subjective:   Cody Willis is a 83 y.o. male who presents for a Medicare Annual Wellness Visit.  Visit info / Clinical Intake: Medicare Wellness Visit Type:: Subsequent Annual Wellness Visit Persons participating in visit and providing information:: patient Medicare Wellness Visit Mode:: Telephone If telephone:: video declined Since this visit was completed virtually, some vitals may be partially provided or unavailable. Missing vitals are due to the limitations of the virtual format.: Unable to obtain vitals - no equipment If Telephone or Video please confirm:: I connected with patient using audio/video enable telemedicine. I verified patient identity with two identifiers, discussed telehealth limitations, and patient agreed to proceed. Patient Location:: Home Provider Location:: Office/Home Interpreter Needed?: No Pre-visit prep was completed: yes AWV questionnaire completed by patient prior to visit?: yes Date:: 03/16/24 Living arrangements:: (Patient-Rptd) lives with spouse/significant other Patient's Overall Health Status Rating: (Patient-Rptd) very good Typical amount of pain: (Patient-Rptd) none Does pain affect daily life?: (Patient-Rptd) no Are you currently prescribed opioids?: no  Dietary Habits and Nutritional Risks How many meals a day?: (Patient-Rptd) 3 Eats fruit and vegetables daily?: (!) (Patient-Rptd) no Most meals are obtained by: (Patient-Rptd) preparing own meals In the last 2 weeks, have you had any of the following?: none Diabetic:: no  Functional Status Activities of Daily Living (to include ambulation/medication): (Patient-Rptd) Independent Ambulation: (Patient-Rptd) Independent Medication Administration: (Patient-Rptd) Independent Home Management (perform basic housework or laundry): (Patient-Rptd) Independent Manage your own finances?: (Patient-Rptd) yes Primary transportation is:  (Patient-Rptd) driving Concerns about vision?: no *vision screening is required for WTM* Concerns about hearing?: no  Fall Screening Falls in the past year?: (Patient-Rptd) 0 Number of falls in past year: 0 Was there an injury with Fall?: 0 Fall Risk Category Calculator: 0 Patient Fall Risk Level: Low Fall Risk  Fall Risk Patient at Risk for Falls Due to: No Fall Risks Fall risk Follow up: Falls evaluation completed; Falls prevention discussed  Home and Transportation Safety: All rugs have non-skid backing?: yes All stairs or steps have railings?: N/A, no stairs Grab bars in the bathtub or shower?: (Patient-Rptd) yes Have non-skid surface in bathtub or shower?: (Patient-Rptd) yes Good home lighting?: (Patient-Rptd) yes Regular seat belt use?: (Patient-Rptd) yes Hospital stays in the last year:: (Patient-Rptd) no  Cognitive Assessment Difficulty concentrating, remembering, or making decisions? : (Patient-Rptd) no Will 6CIT or Mini Cog be Completed: yes What year is it?: 0 points What month is it?: 0 points Give patient an address phrase to remember (5 components): [address] [place] About what time is it?: 0 points Count backwards from 20 to 1: 0 points Say the months of the year in reverse: 0 points Repeat the address phrase from earlier: 0 points 6 CIT Score: 0 points  Advance Directives (For Healthcare) Does Patient Have a Medical Advance Directive?: No Would patient like information on creating a medical advance directive?: No - Patient declined  Reviewed/Updated  Reviewed/Updated: Reviewed All (Medical, Surgical, Family, Medications, Allergies, Care Teams, Patient Goals)    Allergies (verified) Plavix  [clopidogrel  bisulfate] and Sulfamethoxazole-trimethoprim   Current Medications (verified) Outpatient Encounter Medications as of 03/16/2024  Medication Sig   albuterol  (VENTOLIN  HFA) 108 (90 Base) MCG/ACT inhaler Inhale 1-2 puffs into the lungs every 4  (four) hours as needed for wheezing or shortness of breath.   aspirin  EC 81 MG tablet Take 81 mg by mouth daily. Swallow whole.   budesonide-glycopyrrolate-formoterol (BREZTRI  AEROSPHERE) 160-9-4.8  MCG/ACT AERO inhaler Inhale 2 puffs into the lungs 2 (two) times daily.   cholecalciferol (VITAMIN D3) 25 MCG (1000 UNIT) tablet Take 1,000 Units by mouth daily.   ciclopirox  (LOPROX ) 0.77 % cream Apply twice daily to feet and between toes   cyanocobalamin  (VITAMIN B12) 1000 MCG/ML injection INJECT 1 ML INTRAMUSCULARLY EVERY 30 DAYS   fluorouracil  (EFUDEX ) 5 % cream Apply twice daily to temples, lateral cheeks for 7 days   ketoconazole  (NIZORAL ) 2 % shampoo use 2 - 3 times weekly, apply topically to scalp , neck and chest, leave on skin for at least 5 minutes before rinsing off.   lidocaine  (XYLOCAINE ) 5 % ointment Apply 1 Application topically as needed.   mometasone  (ELOCON ) 0.1 % cream Apply topically daily as needed for itch   Pancrelipase , Lip-Prot-Amyl, ([name] ) 25000-79000 units CPEP TAKE 2 CAPULES BY MOUTH WITH BREAKFAST, WITH LUNCH AND WITH AN EVENING MEAL   pantoprazole  (PROTONIX ) 40 MG tablet TAKE 1 TABLET BY MOUTH EVERY MORNING   rosuvastatin  (CRESTOR ) 20 MG tablet Take 1 tablet (20 mg total) by mouth daily.   triamcinolone  (KENALOG ) 0.025 % ointment Apply 1 Application topically daily as needed.   valsartan  (DIOVAN ) 80 MG tablet Take 1 tablet (80 mg total) by mouth daily.   Efinaconazole  (JUBLIA ) 10 % SOLN Apply to affected toenails every night (Patient not taking: Reported on 03/16/2024)   No facility-administered encounter medications on file as of 03/16/2024.    History: Past Medical History:  Diagnosis Date   B12 deficiency    Basal cell carcinoma 02/10/2008   left top of shoulder   Basal cell carcinoma 08/14/2022   superficial, right upper temple, [facility] 09/12/22   Basal cell carcinoma 11/14/2022   left upper forehead, [facility]   Basal cell carcinoma 05/20/2023   left temple, [facility]    Basal cell carcinoma 05/20/2023   Nasal dorsum, [facility]   BCC (basal cell carcinoma) 10/21/2003   superficial spinal lower back   Chronic pancreatitis (HCC)    COPD (chronic obstructive pulmonary disease) (HCC)    Esophageal reflux    Hiatal hernia    Hypertension    Melanoma (HCC) 10/21/2003   right mid abdomen Clarks Level II Breslows Measurement 0.42 mm   Pure hypercholesterolemia    Squamous cell carcinoma of skin 05/20/2023   left side burn, [facility]   Stroke (HCC) 01/16/2015   only on ASA   Vitamin D  deficiency    Past Surgical History:  Procedure Laterality Date   APPENDECTOMY     BACK SURGERY     Diskectomy between L4 and L5   CARPAL TUNNEL RELEASE Left 06/27/2021   Procedure: CARPAL TUNNEL RELEASE LEFT;  Surgeon: Murrell Kuba, MD;  Location: [facility];  Service: Orthopedics;  Laterality: Left;  Regional with monitored anesthesia care   CATARACT EXTRACTION W/ INTRAOCULAR LENS  IMPLANT, BILATERAL  12/15 and 2/16   CHOLECYSTECTOMY     MELANOMA EXCISION  ~2009   right upper abdomen   PARTIAL KNEE ARTHROPLASTY Right 05/01/2022   Procedure: UNICOMPARTMENTAL KNEE;  Surgeon: Beverley Evalene BIRCH, MD;  Location: [facility];  Service: Orthopedics;  Laterality: Right;   Family History  Problem Relation Age of Onset   Stroke Mother    Diabetes Mother    Emphysema Father        smoker   Crohn's disease Sister    Colitis Brother    Colon cancer Neg Hx    Colon polyps Neg Hx    Esophageal cancer  Neg Hx    Rectal cancer Neg Hx    Stomach cancer Neg Hx    Social History   Occupational History   Occupation: Naval architect-- local-retired    Comment: part time  Tobacco Use   Smoking status: Former    Types: Cigarettes   Smokeless tobacco: Never  Vaping Use   Vaping status: Never Used  Substance and Sexual Activity   Alcohol use: No    Alcohol/week: 0.0 standard drinks of alcohol   Drug use: Never   Sexual activity: Not on file   Tobacco Counseling Counseling given:  Not Answered  SDOH Screenings   Food Insecurity: No Food Insecurity (03/16/2024)  Housing: Low Risk  (03/16/2024)  Transportation Needs: No Transportation Needs (03/16/2024)  Utilities: Not At Risk (03/16/2024)  Alcohol Screen: Low Risk  (03/16/2024)  Depression (PHQ2-9): Low Risk  (03/16/2024)  Financial Resource Strain: Low Risk  (03/16/2024)  Physical Activity: Insufficiently Active (03/16/2024)  Social Connections: Moderately Integrated (03/16/2024)  Stress: No Stress Concern Present (03/16/2024)  Tobacco Use: Medium Risk (03/16/2024)  Health Literacy: Adequate Health Literacy (03/16/2024)   See flowsheets for full screening details  Depression Screen PHQ 2 & 9 Depression Scale- Over the past 2 weeks, how often have you been bothered by any of the following problems? Little interest or pleasure in doing things: 0 Feeling down, depressed, or hopeless (PHQ Adolescent also includes...irritable): 0 PHQ-2 Total Score: 0 Trouble falling or staying asleep, or sleeping too much: 0 Feeling tired or having little energy: 0 Poor appetite or overeating (PHQ Adolescent also includes...weight loss): 0 Feeling bad about yourself - or that you are a failure or have let yourself or your family down: 0 Trouble concentrating on things, such as reading the newspaper or watching television (PHQ Adolescent also includes...like school work): 0 Moving or speaking so slowly that other people could have noticed. Or the opposite - being so fidgety or restless that you have been moving around a lot more than usual: 0 Thoughts that you would be better off dead, or of hurting yourself in some way: 0 PHQ-9 Total Score: 0 If you checked off any problems, how difficult have these problems made it for you to do your work, take care of things at home, or get along with other people?: Not difficult at all     Goals Addressed             This Visit's Progress    Patient Stated       Continue to stay active and walk              Objective:    Today's Vitals   03/16/24 1010  Weight: 170 lb (77.1 kg)  Height: 5' 6 (1.676 m)   Body mass index is 27.44 kg/m.  Hearing/Vision screen Hearing Screening - Comments:: No issues Vision Screening - Comments:: Glasses, [facility], up to date Immunizations and Health Maintenance Health Maintenance  Topic Date Due   Zoster Vaccines- Shingrix (1 of 2) Never done   COVID-19 Vaccine (4 - 2025-26 season) 12/09/2023   Influenza Vaccine  07/07/2024 (Originally 11/08/2023)   Medicare Annual Wellness (AWV)  03/16/2025   DTaP/Tdap/Td (3 - Td or Tdap) 08/26/2033   Pneumococcal Vaccine: 50+ Years  Completed   Meningococcal B Vaccine  Aged Out   Lung Cancer Screening  Discontinued   Hepatitis C Screening  Discontinued        Assessment/Plan:  This is a routine wellness examination for Narcissa.  Patient  Care Team: Watt Mirza, MD as PCP - General (Family Medicine) Jeffrie Oneil BROCKS, MD as PCP - Cardiology (Cardiology) Geronimo Amel, MD as Consulting Physician (Pulmonary Disease) Pyrtle, Gordy HERO, MD as Consulting Physician (Gastroenterology) Cara Elida HERO, NP as Nurse Practitioner (Gastroenterology)  I have personally reviewed and noted the following in the patient's chart:   Medical and social history Use of alcohol, tobacco or illicit drugs  Current medications and supplements including opioid prescriptions. Functional ability and status Nutritional status Physical activity Advanced directives List of other physicians Hospitalizations, surgeries, and ER visits in previous 12 months Vitals Screenings to include cognitive, depression, and falls Referrals and appointments  No orders of the defined types were placed in this encounter.  In addition, I have reviewed and discussed with patient certain preventive protocols, quality metrics, and best practice recommendations. A written personalized care plan for preventive  services as well as general preventive health recommendations were provided to patient.   Angeline Fredericks, LPN   [id number]/04/7972   Return in 1 year (on 03/16/2025).  After Visit Summary: (MyChart) Due to this being a telephonic visit, the after visit summary with patients personalized plan was offered to patient via MyChart   Nurse Notes: Patient declines all vaccines.

## 2024-03-16 NOTE — Patient Instructions (Signed)
 Cody Willis,  Thank you for taking the time for your Medicare Wellness Visit. I appreciate your continued commitment to your health goals. Please review the care plan we discussed, and feel free to reach out if I can assist you further.  Please note that Annual Wellness Visits do not include a physical exam. Some assessments may be limited, especially if the visit was conducted virtually. If needed, we may recommend an in-person follow-up with your provider.  Ongoing Care Seeing your primary care provider every 3 to 6 months helps us  monitor your health and provide consistent, personalized care.  Consider updating your vaccines.  Referrals If a referral was made during today's visit and you haven't received any updates within two weeks, please contact the referred provider directly to check on the status.  Recommended Screenings:  Health Maintenance  Topic Date Due   Zoster (Shingles) Vaccine (1 of 2) Never done   COVID-19 Vaccine (4 - 2025-26 season) 12/09/2023   Flu Shot  07/07/2024*   Medicare Annual Wellness Visit  03/16/2025   DTaP/Tdap/Td vaccine (3 - Td or Tdap) 08/26/2033   Pneumococcal Vaccine for age over 25  Completed   Meningitis B Vaccine  Aged Out   Screening for Lung Cancer  Discontinued   Hepatitis C Screening  Discontinued  *Topic was postponed. The date shown is not the original due date.       03/16/2024   10:00 AM  Advanced Directives  Does Patient Have a Medical Advance Directive? No  Would patient like information on creating a medical advance directive? No - Patient declined    Vision: Annual vision screenings are recommended for early detection of glaucoma, cataracts, and diabetic retinopathy. These exams can also reveal signs of chronic conditions such as diabetes and high blood pressure.  Dental: Annual dental screenings help detect early signs of oral cancer, gum disease, and other conditions linked to overall health, including heart disease and  diabetes.  Please see the attached documents for additional preventive care recommendations.

## 2024-03-16 NOTE — Telephone Encounter (Signed)
 Advised patient biopsy of the right temple at hair line was [facility] and was treated with [facility]. No further treatment needed at this time. Will recheck on follow-up.

## 2024-03-16 NOTE — Telephone Encounter (Signed)
-----   Message from Rexene Rattler sent at 03/16/2024  1:26 PM EST ----- 1. Skin, right temple at hair line :       SUPERFICIAL AND NODULAR BASAL CELL CARCINOMA   BCC skin cancer- already treated with [facility] at time of biopsy, observe for recurrence  - please call patient ----- Message ----- From: Interface, Lab In Three Zero One Sent: 03/11/2024   3:52 PM EST To: Rexene Rattler, MD

## 2024-03-18 ENCOUNTER — Other Ambulatory Visit: Payer: Self-pay

## 2024-03-18 MED ORDER — JUBLIA 10 % EX SOLN: CUTANEOUS | 11 refills | Status: AC

## 2024-04-10 ENCOUNTER — Other Ambulatory Visit (HOSPITAL_COMMUNITY): Payer: Self-pay

## 2024-04-14 ENCOUNTER — Ambulatory Visit: Admitting: Cardiology

## 2024-04-15 ENCOUNTER — Encounter: Payer: Self-pay | Admitting: Pharmacist

## 2024-04-15 NOTE — Progress Notes (Signed)
 Chart Review Reason: Drug information Question - [name]   Summary: Patient brought in letter from [employer] outlining his estimated copay amount for [name]  for 2026 (this is a requirement for his [name]  PAP application).   Faxed this along with letter from [facility] requesting this information. Included his 2026 [employer] PPO I summary of benefit for prescription drugs and cash price for [name]  for 2026 at [facility].

## 2024-04-16 ENCOUNTER — Ambulatory Visit: Attending: Cardiology | Admitting: Cardiology

## 2024-04-16 VITALS — BP 161/83 | HR 60 | Ht 66.0 in | Wt 168.0 lb

## 2024-04-16 DIAGNOSIS — I2510 Atherosclerotic heart disease of native coronary artery without angina pectoris: Secondary | ICD-10-CM

## 2024-04-16 DIAGNOSIS — E782 Mixed hyperlipidemia: Secondary | ICD-10-CM | POA: Diagnosis not present

## 2024-04-16 DIAGNOSIS — I700 Atherosclerosis of aorta: Secondary | ICD-10-CM

## 2024-04-16 DIAGNOSIS — I10 Essential (primary) hypertension: Secondary | ICD-10-CM | POA: Diagnosis not present

## 2024-04-16 NOTE — Patient Instructions (Signed)

## 2024-04-16 NOTE — Telephone Encounter (Signed)
 Received a fax from [place] patient assistance program informing us  that the patient has been approved through 04/08/25.

## 2024-04-16 NOTE — Progress Notes (Signed)
 " Cardiology Office Note:  .   Date:  04/16/2024  ID:  Cody Willis, DOB 1940/11/10, MRN [id number] PCP: Watt Mirza, MD  [facility] Providers Cardiologist:  Oneil Parchment, MD    History of Present Illness: .   Cody Willis is a 84 y.o. male Discussed the use of AI scribe  History of Present Illness Cody Willis is an 84 year old male with non-obstructive coronary artery disease who presents for follow-up.  A coronary CT scan on February 10, 2021, revealed a calcium  score of 253, placing him in the 42nd percentile. He has no chest pain or shortness of breath and remains active. His last LDL was 37 on Sep 05, 2023, and he is on Crestor  20 mg for hyperlipidemia.  He has a history of mild right carotid artery plaque and a small caliber left carotid artery as shown by a carotid ultrasound. An echocardiogram in 2022 showed an ejection fraction of 65%. He is on low-dose aspirin  for secondary prevention due to a history of stroke.  He has COPD and uses a metered-dose inhaler as needed. He quit smoking in 2016 and reports no desire to resume. He remains active, working with wood and using a chainsaw and wood splitter.  His family history is significant for coronary disease, with a younger brother who passed away a few years ago.  His current medications include Crestor  20 mg for hyperlipidemia, aspirin  81 mg for stroke prevention, and valsartan  80 mg daily for hypertension. His A1c is 5.4, and creatinine is 1.2.      ROS: No CP. Mild SOB  Studies Reviewed: SABRA   EKG Interpretation Date/Time:  Thursday April 16 2024 09:32:55 EST Ventricular Rate:  61 PR Interval:  120 QRS Duration:  94 QT Interval:  402 QTC Calculation: 404 R Axis:   60  Text Interpretation: Normal sinus rhythm  with PAC Normal ECG When compared with ECG of 18-Dec-2022 14:54, No significant change was found Confirmed by Parchment Oneil ([id number]) on 04/16/2024 9:35:54 AM    Results Labs A1c (2024):  5.4 Creatinine (2024): 1.2 LDL (2024): 54 decreased from 37 on 09/05/2023 A1c: 5.5 Hemoglobin: 13.8 Creatinine: 1.1 decreased from 1.07 on 09/05/2023  Radiology Coronary CT angiography (02/10/2021): Non-obstructive coronary artery disease with mild atherosclerotic plaque; coronary artery calcium  score 253, 42nd percentile  Diagnostic Carotid ultrasound: Mild right carotid artery atherosclerotic plaque; left carotid artery small caliber Echocardiogram (2022): Left ventricular ejection fraction 65% EKG: Within normal limits (Independently interpreted) Risk Assessment/Calculations:           Physical Exam:   VS:  BP (!) 161/83 (BP Location: Left Arm, Patient Position: Sitting, Cuff Size: Normal)   Pulse 60   Ht 5' 6 (1.676 m)   Wt 168 lb (76.2 kg)   BMI 27.12 kg/m    Wt Readings from Last 3 Encounters:  04/16/24 168 lb (76.2 kg)  03/16/24 170 lb (77.1 kg)  03/09/24 170 lb 2 oz (77.2 kg)    GEN: Well nourished, well developed in no acute distress NECK: No JVD; No carotid bruits CARDIAC: RRR, no murmurs, no rubs, no gallops RESPIRATORY:  Clear to auscultation without rales, wheezing or rhonchi  ABDOMEN: Soft, non-tender, non-distended EXTREMITIES:  No edema; No deformity   ASSESSMENT AND PLAN: .    Assessment and Plan Assessment & Plan Nonobstructive coronary artery disease Mild plaque on coronary CT scan from November 2022 with a calcium  score of 253 (42nd percentile). No current symptoms such as  chest pain or shortness of breath. Remains active. Previous catheterization 20 years ago showed no significant findings. Current management with aspirin  and statin is effective. - Continue aspirin  81 mg daily for secondary prevention. - Continue Crestor  20 mg daily for hyperlipidemia management.  Mixed hyperlipidemia LDL cholesterol well-controlled at 37 mg/dL as of May 7974. Current management with Crestor  is effective. - Continue Crestor  20 mg daily.  Essential  hypertension Blood pressure slightly elevated at 161 mmHg during the visit, but typically well-controlled with valsartan . Previous readings were around 130/60 mmHg. - Continue valsartan  80 mg daily. - Monitor blood pressure at home with a home monitor.  Chronic obstructive pulmonary disease COPD managed with MDI as needed. No current exacerbations or symptoms reported. Quit smoking in 2016. - Continue MDI as needed for symptom management.  History of ischemic stroke On low-dose aspirin  for secondary prevention. No new neurological symptoms reported. - Continue aspirin  81 mg daily for secondary prevention.  Carotid artery plaque Mild right carotid artery plaque and small caliber left carotid artery noted on ultrasound. No acute symptoms reported. - Continue current management and monitoring.         Dispo: 1 yr APP  Signed, Oneil Parchment, MD  "

## 2024-04-29 ENCOUNTER — Telehealth: Payer: Self-pay | Admitting: Family Medicine

## 2024-04-29 NOTE — Telephone Encounter (Unsigned)
 Copied from CRM #[id number]. Topic: Appointments - Scheduling Inquiry for Clinic >> Apr 29, 2024 12:59 PM Willma R wrote: Reason for CRM: Patient would like to schedule an appoint with the pharmacist, Manuelita Kobs to be seen in regards to his prescriptions.  Patient can be reached at [phone]

## 2024-05-04 ENCOUNTER — Telehealth: Payer: Self-pay | Admitting: Pharmacist

## 2024-05-04 NOTE — Progress Notes (Signed)
 Brief Telephone Documentation Reason for Call: Patient left message with front office regarding question for pharmacist   Summary of Call: Called patient 05/04/24.  He reports he has received an approval letter from [place] program indicating he has been approved for assistance through 04/08/25. Approval letter also received by GI office and has been scanned into Media tab.   Patient notes the [name]  will be shipped to provider's office according to the letter. Confirmed with patient this will he the [employer] office of Dr. Albertus. He verbalizes understanding. He expresses gratitude for all the help with his enrollment. Has no further questions.    As previously discussed, he was also approved or his inhaler through 04/08/25 via [employer] which will continue to ship directly to his home.   Follow Up: N/A - at this time, patient has been approved for all 2026 PAP application.  Patient given direct line for further questions/concerns.  [name], PharmD, BCACP, CPP Clinical Pharmacist Practitioner [facility] Ph: [phone]

## 2024-05-15 ENCOUNTER — Other Ambulatory Visit: Payer: Self-pay | Admitting: Cardiology

## 2024-06-05 ENCOUNTER — Ambulatory Visit (INDEPENDENT_AMBULATORY_CARE_PROVIDER_SITE_OTHER): Admitting: Otolaryngology

## 2024-09-01 ENCOUNTER — Other Ambulatory Visit

## 2024-09-07 ENCOUNTER — Encounter: Admitting: Family Medicine

## 2024-09-29 ENCOUNTER — Ambulatory Visit: Admitting: Dermatology

## 2025-03-19 ENCOUNTER — Ambulatory Visit
